# Patient Record
Sex: Female | Born: 1980 | Race: White | Hispanic: No | Marital: Single | Smoking: Current every day smoker
Health system: Southern US, Community
[De-identification: ages and names within clinical notes are randomized; demographics above are authoritative.]

## PROBLEM LIST (undated history)

## (undated) DIAGNOSIS — R0600 Dyspnea, unspecified: Secondary | ICD-10-CM

## (undated) DIAGNOSIS — R569 Unspecified convulsions: Secondary | ICD-10-CM

## (undated) DIAGNOSIS — F419 Anxiety disorder, unspecified: Secondary | ICD-10-CM

## (undated) DIAGNOSIS — S272XXA Traumatic hemopneumothorax, initial encounter: Secondary | ICD-10-CM

## (undated) DIAGNOSIS — S129XXA Fracture of neck, unspecified, initial encounter: Secondary | ICD-10-CM

## (undated) DIAGNOSIS — F333 Major depressive disorder, recurrent, severe with psychotic symptoms: Secondary | ICD-10-CM

## (undated) DIAGNOSIS — F3281 Premenstrual dysphoric disorder: Secondary | ICD-10-CM

## (undated) DIAGNOSIS — F329 Major depressive disorder, single episode, unspecified: Secondary | ICD-10-CM

## (undated) DIAGNOSIS — T50902A Poisoning by unspecified drugs, medicaments and biological substances, intentional self-harm, initial encounter: Secondary | ICD-10-CM

## (undated) DIAGNOSIS — J9690 Respiratory failure, unspecified, unspecified whether with hypoxia or hypercapnia: Secondary | ICD-10-CM

## (undated) DIAGNOSIS — T7840XA Allergy, unspecified, initial encounter: Secondary | ICD-10-CM

## (undated) DIAGNOSIS — G8929 Other chronic pain: Secondary | ICD-10-CM

## (undated) DIAGNOSIS — S62609A Fracture of unspecified phalanx of unspecified finger, initial encounter for closed fracture: Secondary | ICD-10-CM

## (undated) DIAGNOSIS — F332 Major depressive disorder, recurrent severe without psychotic features: Secondary | ICD-10-CM

## (undated) DIAGNOSIS — J189 Pneumonia, unspecified organism: Secondary | ICD-10-CM

## (undated) DIAGNOSIS — F39 Unspecified mood [affective] disorder: Secondary | ICD-10-CM

## (undated) DIAGNOSIS — M25512 Pain in left shoulder: Secondary | ICD-10-CM

## (undated) DIAGNOSIS — R1310 Dysphagia, unspecified: Secondary | ICD-10-CM

## (undated) DIAGNOSIS — N946 Dysmenorrhea, unspecified: Secondary | ICD-10-CM

## (undated) DIAGNOSIS — S22081A Stable burst fracture of T11-T12 vertebra, initial encounter for closed fracture: Secondary | ICD-10-CM

## (undated) DIAGNOSIS — S32009A Unspecified fracture of unspecified lumbar vertebra, initial encounter for closed fracture: Secondary | ICD-10-CM

## (undated) DIAGNOSIS — S12200A Unspecified displaced fracture of third cervical vertebra, initial encounter for closed fracture: Secondary | ICD-10-CM

## (undated) DIAGNOSIS — S060X9A Concussion with loss of consciousness of unspecified duration, initial encounter: Secondary | ICD-10-CM

## (undated) DIAGNOSIS — N200 Calculus of kidney: Secondary | ICD-10-CM

## (undated) DIAGNOSIS — G43009 Migraine without aura, not intractable, without status migrainosus: Secondary | ICD-10-CM

## (undated) DIAGNOSIS — S42009A Fracture of unspecified part of unspecified clavicle, initial encounter for closed fracture: Secondary | ICD-10-CM

## (undated) DIAGNOSIS — F32A Depression, unspecified: Secondary | ICD-10-CM

## (undated) DIAGNOSIS — N179 Acute kidney failure, unspecified: Secondary | ICD-10-CM

## (undated) DIAGNOSIS — F19951 Other psychoactive substance use, unspecified with psychoactive substance-induced psychotic disorder with hallucinations: Secondary | ICD-10-CM

## (undated) DIAGNOSIS — J45909 Unspecified asthma, uncomplicated: Secondary | ICD-10-CM

## (undated) DIAGNOSIS — G4700 Insomnia, unspecified: Secondary | ICD-10-CM

## (undated) HISTORY — DX: Anxiety disorder, unspecified: F41.9

## (undated) HISTORY — DX: Traumatic hemopneumothorax, initial encounter: S27.2XXA

## (undated) HISTORY — DX: Migraine without aura, not intractable, without status migrainosus: G43.009

## (undated) HISTORY — DX: Insomnia, unspecified: G47.00

## (undated) HISTORY — DX: Concussion with loss of consciousness of unspecified duration, initial encounter: S06.0X9A

## (undated) HISTORY — DX: Poisoning by unspecified drugs, medicaments and biological substances, intentional self-harm, initial encounter: T50.902A

## (undated) HISTORY — DX: Calculus of kidney: N20.0

## (undated) HISTORY — DX: Major depressive disorder, recurrent severe without psychotic features: F33.2

## (undated) HISTORY — DX: Other chronic pain: G89.29

## (undated) HISTORY — DX: Depression, unspecified: F32.A

## (undated) HISTORY — DX: Major depressive disorder, single episode, unspecified: F32.9

## (undated) HISTORY — DX: Fracture of unspecified phalanx of unspecified finger, initial encounter for closed fracture: S62.609A

## (undated) HISTORY — DX: Acute kidney failure, unspecified: N17.9

## (undated) HISTORY — DX: Unspecified asthma, uncomplicated: J45.909

## (undated) HISTORY — DX: Premenstrual dysphoric disorder: F32.81

## (undated) HISTORY — DX: Dysmenorrhea, unspecified: N94.6

## (undated) HISTORY — DX: Major depressive disorder, recurrent, severe with psychotic symptoms: F33.3

## (undated) HISTORY — DX: Other psychoactive substance use, unspecified with psychoactive substance-induced psychotic disorder with hallucinations: F19.951

## (undated) HISTORY — DX: Allergy, unspecified, initial encounter: T78.40XA

## (undated) HISTORY — DX: Pain in left shoulder: M25.512

## (undated) HISTORY — PX: FRACTURE SURGERY: SHX138

---

## 2003-03-18 ENCOUNTER — Emergency Department (HOSPITAL_COMMUNITY): Admission: EM | Admit: 2003-03-18 | Discharge: 2003-03-18 | Payer: Self-pay | Admitting: Emergency Medicine

## 2003-06-12 ENCOUNTER — Emergency Department (HOSPITAL_COMMUNITY): Admission: EM | Admit: 2003-06-12 | Discharge: 2003-06-12 | Payer: Self-pay | Admitting: Emergency Medicine

## 2003-09-07 ENCOUNTER — Emergency Department (HOSPITAL_COMMUNITY): Admission: EM | Admit: 2003-09-07 | Discharge: 2003-09-07 | Payer: Self-pay | Admitting: Emergency Medicine

## 2004-04-10 ENCOUNTER — Emergency Department (HOSPITAL_COMMUNITY): Admission: EM | Admit: 2004-04-10 | Discharge: 2004-04-11 | Payer: Self-pay | Admitting: Emergency Medicine

## 2006-10-01 ENCOUNTER — Emergency Department (HOSPITAL_COMMUNITY): Admission: EM | Admit: 2006-10-01 | Discharge: 2006-10-01 | Payer: Self-pay | Admitting: Emergency Medicine

## 2007-07-30 ENCOUNTER — Emergency Department (HOSPITAL_COMMUNITY): Admission: EM | Admit: 2007-07-30 | Discharge: 2007-07-30 | Payer: Self-pay | Admitting: Emergency Medicine

## 2007-09-07 ENCOUNTER — Emergency Department (HOSPITAL_COMMUNITY): Admission: EM | Admit: 2007-09-07 | Discharge: 2007-09-07 | Payer: Self-pay | Admitting: Emergency Medicine

## 2008-01-19 ENCOUNTER — Emergency Department (HOSPITAL_COMMUNITY): Admission: EM | Admit: 2008-01-19 | Discharge: 2008-01-19 | Payer: Self-pay | Admitting: Emergency Medicine

## 2008-01-20 ENCOUNTER — Emergency Department (HOSPITAL_COMMUNITY): Admission: EM | Admit: 2008-01-20 | Discharge: 2008-01-20 | Payer: Self-pay | Admitting: Emergency Medicine

## 2009-01-24 ENCOUNTER — Other Ambulatory Visit: Admission: RE | Admit: 2009-01-24 | Discharge: 2009-01-24 | Payer: Self-pay | Admitting: Gynecology

## 2009-01-24 ENCOUNTER — Ambulatory Visit: Payer: Self-pay | Admitting: Gynecology

## 2009-01-24 ENCOUNTER — Encounter: Payer: Self-pay | Admitting: Gynecology

## 2009-02-02 ENCOUNTER — Ambulatory Visit: Payer: Self-pay | Admitting: Gynecology

## 2009-02-10 ENCOUNTER — Ambulatory Visit: Payer: Self-pay | Admitting: Gynecology

## 2009-09-22 ENCOUNTER — Ambulatory Visit: Payer: Self-pay | Admitting: Women's Health

## 2009-09-22 ENCOUNTER — Other Ambulatory Visit: Admission: RE | Admit: 2009-09-22 | Discharge: 2009-09-22 | Payer: Self-pay | Admitting: Gynecology

## 2009-10-04 ENCOUNTER — Other Ambulatory Visit: Admission: RE | Admit: 2009-10-04 | Discharge: 2009-10-04 | Payer: Self-pay | Admitting: Gynecology

## 2009-10-04 ENCOUNTER — Ambulatory Visit: Payer: Self-pay | Admitting: Women's Health

## 2009-11-10 ENCOUNTER — Ambulatory Visit: Payer: Self-pay | Admitting: Gynecology

## 2010-06-21 ENCOUNTER — Ambulatory Visit: Payer: Self-pay | Admitting: Women's Health

## 2010-11-15 ENCOUNTER — Ambulatory Visit (INDEPENDENT_AMBULATORY_CARE_PROVIDER_SITE_OTHER): Payer: BC Managed Care – PPO | Admitting: Gynecology

## 2010-11-15 DIAGNOSIS — Z30431 Encounter for routine checking of intrauterine contraceptive device: Secondary | ICD-10-CM

## 2010-12-17 ENCOUNTER — Ambulatory Visit: Payer: BC Managed Care – PPO | Admitting: Gynecology

## 2010-12-31 ENCOUNTER — Ambulatory Visit (INDEPENDENT_AMBULATORY_CARE_PROVIDER_SITE_OTHER): Payer: BC Managed Care – PPO | Admitting: Gynecology

## 2010-12-31 DIAGNOSIS — Z30431 Encounter for routine checking of intrauterine contraceptive device: Secondary | ICD-10-CM

## 2011-08-08 ENCOUNTER — Encounter: Payer: Self-pay | Admitting: Emergency Medicine

## 2011-08-08 ENCOUNTER — Emergency Department (HOSPITAL_COMMUNITY)
Admission: EM | Admit: 2011-08-08 | Discharge: 2011-08-09 | Disposition: A | Discharge status: Home / Self Care | Payer: BC Managed Care – PPO | Attending: Emergency Medicine | Admitting: Emergency Medicine

## 2011-08-08 DIAGNOSIS — R110 Nausea: Secondary | ICD-10-CM | POA: Insufficient documentation

## 2011-08-08 DIAGNOSIS — R1011 Right upper quadrant pain: Secondary | ICD-10-CM | POA: Insufficient documentation

## 2011-08-08 DIAGNOSIS — M549 Dorsalgia, unspecified: Secondary | ICD-10-CM | POA: Insufficient documentation

## 2011-08-08 DIAGNOSIS — R10811 Right upper quadrant abdominal tenderness: Secondary | ICD-10-CM | POA: Insufficient documentation

## 2011-08-08 DIAGNOSIS — R197 Diarrhea, unspecified: Secondary | ICD-10-CM | POA: Insufficient documentation

## 2011-08-08 DIAGNOSIS — R112 Nausea with vomiting, unspecified: Secondary | ICD-10-CM

## 2011-08-08 DIAGNOSIS — R319 Hematuria, unspecified: Secondary | ICD-10-CM | POA: Insufficient documentation

## 2011-08-08 DIAGNOSIS — F17200 Nicotine dependence, unspecified, uncomplicated: Secondary | ICD-10-CM | POA: Insufficient documentation

## 2011-08-08 NOTE — ED Notes (Signed)
PT. REPORTS RUQ PAIN WITH VOMITTING AND DIARRHEA ONSET YESTERDAY WORSE WITH DEEP INSPIRATION.

## 2011-08-09 ENCOUNTER — Emergency Department (HOSPITAL_COMMUNITY): Payer: BC Managed Care – PPO

## 2011-08-09 LAB — COMPREHENSIVE METABOLIC PANEL
ALT: 8 U/L (ref 0–35)
BUN: 10 mg/dL (ref 6–23)
CO2: 25 mEq/L (ref 19–32)
Chloride: 106 mEq/L (ref 96–112)
Creatinine, Ser: 0.6 mg/dL (ref 0.50–1.10)
Glucose, Bld: 120 mg/dL — ABNORMAL HIGH (ref 70–99)
Total Bilirubin: 0.2 mg/dL — ABNORMAL LOW (ref 0.3–1.2)
Total Protein: 6.2 g/dL (ref 6.0–8.3)

## 2011-08-09 LAB — CBC
HCT: 39.3 % (ref 36.0–46.0)
MCHC: 34.4 g/dL (ref 30.0–36.0)
MCV: 92.3 fL (ref 78.0–100.0)
Platelets: 218 10*3/uL (ref 150–400)
RBC: 4.26 MIL/uL (ref 3.87–5.11)
RDW: 12.7 % (ref 11.5–15.5)
WBC: 9.7 10*3/uL (ref 4.0–10.5)

## 2011-08-09 LAB — DIFFERENTIAL
Monocytes Absolute: 0.6 10*3/uL (ref 0.1–1.0)
Monocytes Relative: 6 % (ref 3–12)

## 2011-08-09 LAB — URINALYSIS, ROUTINE W REFLEX MICROSCOPIC
Bilirubin Urine: NEGATIVE
Glucose, UA: NEGATIVE mg/dL
Nitrite: NEGATIVE
Protein, ur: NEGATIVE mg/dL
pH: 6.5 (ref 5.0–8.0)

## 2011-08-09 LAB — PREGNANCY, URINE: Preg Test, Ur: NEGATIVE

## 2011-08-09 LAB — URINE MICROSCOPIC-ADD ON

## 2011-08-09 MED ORDER — ONDANSETRON HCL 4 MG PO TABS
4.0000 mg | ORAL_TABLET | Freq: Four times a day (QID) | ORAL | Status: AC
Start: 2011-08-09 — End: 2011-08-16

## 2011-08-09 MED ORDER — HYDROMORPHONE HCL PF 1 MG/ML IJ SOLN
1.0000 mg | Freq: Once | INTRAMUSCULAR | Status: AC
  Administered 2011-08-09: 1 mg via INTRAVENOUS
  Filled 2011-08-09: qty 1

## 2011-08-09 MED ORDER — ONDANSETRON HCL 4 MG/2ML IJ SOLN
4.0000 mg | Freq: Once | INTRAMUSCULAR | Status: AC
  Administered 2011-08-09: 4 mg via INTRAVENOUS
  Filled 2011-08-09: qty 2

## 2011-08-09 MED ORDER — SODIUM CHLORIDE 0.9 % IV BOLUS (SEPSIS)
1000.0000 mL | Freq: Once | INTRAVENOUS | Status: AC
  Administered 2011-08-09: 1000 mL via INTRAVENOUS

## 2011-08-09 MED ORDER — HYDROCODONE-ACETAMINOPHEN 5-500 MG PO TABS: 1.0000 | ORAL_TABLET | Freq: Four times a day (QID) | ORAL | Status: AC | PRN

## 2011-08-09 NOTE — ED Provider Notes (Signed)
History     CSN: [id number] Arrival date & time: 08/08/2011 11:41 PM   First MD Initiated Contact with Patient 08/09/11 0129      Chief Complaint  Patient presents with  . Emesis    (Consider location/radiation/quality/duration/timing/severity/associated sxs/prior treatment) The history is provided by the patient.   patient reports nausea vomiting and diarrhea since yesterday.  She reports new constant right upper quadrant pain that radiates around her arrival upper back.  She reports nonbloody nonbilious vomiting.  She reports the diarrhea is voluminous and watery treatment she denies melena or hematochezia.  She denies fevers and chills.  She reports she was told she was having a "gallbladder attack" several years ago but has never had an ultrasound to evaluate for cholelithiasis.  Her pain is constant.  It is worsened by palpation and movement.  It is improved by nothing.  She reports her nausea is improved at this time.  Her pain is moderate.  History reviewed. No pertinent past medical history.  History reviewed. No pertinent past surgical history.  No family history on file.  History  Substance Use Topics  . Smoking status: Current Everyday Smoker  . Smokeless tobacco: Not on file  . Alcohol Use: Yes    OB History    Grav Para Term Preterm Abortions TAB SAB Ect Mult Living                  Review of Systems  Gastrointestinal: Positive for vomiting.  All other systems reviewed and are negative.    Allergies  Penicillins  Home Medications   Current Outpatient Rx  Name Route Sig Dispense Refill  . IBUPROFEN 600 MG PO TABS Oral Take 600 mg by mouth every 6 (six) hours as needed. For pain     . THERA M PLUS PO TABS Oral Take 1 tablet by mouth daily.       BP 113/66  Pulse 65  Temp(Src) 98.2 F (36.8 C) (Oral)  Resp 18  SpO2 100%  LMP 08/05/2011  Physical Exam  Nursing note and vitals reviewed. Constitutional: She is oriented to person, place, and time.  She appears well-developed and well-nourished. No distress.  HENT:  Head: Normocephalic and atraumatic.  Eyes: EOM are normal.  Neck: Normal range of motion.  Cardiovascular: Normal rate, regular rhythm and normal heart sounds.   Pulmonary/Chest: Effort normal and breath sounds normal.  Abdominal: Soft. She exhibits no distension.       Tenderness in the right upper quadrant without guarding or rebound.  No right CVA tenderness  Musculoskeletal: Normal range of motion.  Neurological: She is alert and oriented to person, place, and time.  Skin: Skin is warm and dry.  Psychiatric: She has a normal mood and affect. Judgment normal.    ED Course  Procedures (including critical care time)  Labs Reviewed  COMPREHENSIVE METABOLIC PANEL - Abnormal; Notable for the following:    Glucose, Bld 120 (*)    Total Bilirubin 0.2 (*)    All other components within normal limits  URINALYSIS, ROUTINE W REFLEX MICROSCOPIC - Abnormal; Notable for the following:    Hgb urine dipstick SMALL (*)    All other components within normal limits  CBC  DIFFERENTIAL  PREGNANCY, URINE  LIPASE, BLOOD  URINE MICROSCOPIC-ADD ON   US Abdomen Complete  08/09/2011  *RADIOLOGY REPORT*  Clinical Data:  Right upper quadrant abdominal pain for 1 day. Hematuria.  ABDOMINAL ULTRASOUND COMPLETE  Comparison:  None  Findings:  Gallbladder:  The gallbladder is normal in appearance, without evidence for gallstones, gallbladder wall thickening or pericholecystic fluid.  No ultrasonographic Murphy's sign is elicited.  Common Bile Duct:  0.3 cm in diameter; within normal limits in caliber.  Liver:  Normal parenchymal echogenicity and echotexture; no focal lesions identified.  Limited Doppler evaluation demonstrates normal blood flow within the liver.  IVC:  Unremarkable in appearance.  Pancreas:  Although the pancreas is difficult to visualize in its entirety due to overlying bowel gas, no focal pancreatic abnormality is identified.   Spleen:  8.7 cm in length; within normal limits in size and echotexture.  Right kidney:  11.1 cm in length; normal in size, configuration and parenchymal echogenicity.  No evidence of mass or hydronephrosis.  Left kidney:  12.8 cm in length; normal in size, configuration and parenchymal echogenicity.  No evidence of hydronephrosis.  Two tiny nonspecific foci of increased attenuation within the upper pole of the right kidney, measuring 0.4 cm and 0.6 cm in size, may reflect small angiomyolipomas.  Calcification is considered less likely given the location and the lack of posterior acoustic shadowing.  Abdominal Aorta:  Normal in caliber; no aneurysm identified.  IMPRESSION:  1.  No acute abnormalities identified within the abdomen. 2.  Question of tiny angiomyolipomas within the upper pole of the left kidney.  Original Report Authenticated By: Tonia Ghent, M.D.     1. Nausea vomiting and diarrhea       MDM  Concerning for biliary colic.  May also represent gastroenteritis given her nausea vomiting and diarrhea We'll obtain ultrasound to evaluate further.  Pain being treated at this time.  Will add lipase  5:17 AM Patient feels much better at this time and is eating a sandwich.       Lyanne Co, MD 08/09/11 [phone]

## 2011-08-09 NOTE — ED Notes (Signed)
Pt given happy meal.  Tolerated well w/out nausea, pain or emesis.

## 2011-08-09 NOTE — ED Notes (Signed)
Patient transported to Ultrasound 

## 2011-08-09 NOTE — ED Notes (Signed)
PT states pain 2/10 after dilaudid and denies nausea - she is asking for something to eat.  Pt taken to [place].

## 2011-08-09 NOTE — ED Notes (Signed)
PT back from [place] stating that she is STARVING and would like to eat.  Await [place] results.

## 2011-08-09 NOTE — ED Notes (Signed)
Called [place].  They stated they have sent for pt.

## 2011-09-13 ENCOUNTER — Ambulatory Visit (INDEPENDENT_AMBULATORY_CARE_PROVIDER_SITE_OTHER): Payer: BC Managed Care – PPO

## 2011-09-13 DIAGNOSIS — J019 Acute sinusitis, unspecified: Secondary | ICD-10-CM

## 2011-09-13 DIAGNOSIS — R059 Cough, unspecified: Secondary | ICD-10-CM

## 2011-09-13 DIAGNOSIS — R05 Cough: Secondary | ICD-10-CM

## 2011-10-29 ENCOUNTER — Other Ambulatory Visit: Payer: Self-pay

## 2011-10-29 MED ORDER — ZOLPIDEM TARTRATE 10 MG PO TABS: 10.0000 mg | ORAL_TABLET | Freq: Every evening | ORAL | Status: DC | PRN

## 2011-11-07 ENCOUNTER — Other Ambulatory Visit: Payer: Self-pay | Admitting: Physician Assistant

## 2011-11-07 MED ORDER — ZOLPIDEM TARTRATE 10 MG PO TABS: 10.0000 mg | ORAL_TABLET | Freq: Every evening | ORAL | Status: DC | PRN

## 2011-11-08 ENCOUNTER — Telehealth: Payer: Self-pay

## 2011-11-08 NOTE — Telephone Encounter (Signed)
Pt would like referral for a breast mammogram if possible

## 2011-11-11 NOTE — Telephone Encounter (Signed)
I think an OV would be best to determine the reason for her requesting this exam.

## 2011-11-11 NOTE — Telephone Encounter (Signed)
LMOM for pt that if she just needs a routine screening mammogram, no referral is needed, and she can call herself and schedule appt. If she thinks there is a problem and wants a diagnostic mammogram, she just needs to come in for eval and then we could refer for that if needed. Asked for CB w/further ?s.

## 2011-11-19 ENCOUNTER — Ambulatory Visit (INDEPENDENT_AMBULATORY_CARE_PROVIDER_SITE_OTHER): Payer: BC Managed Care – PPO | Admitting: Physician Assistant

## 2011-11-19 ENCOUNTER — Encounter: Payer: Self-pay | Admitting: Physician Assistant

## 2011-11-19 VITALS — BP 118/80 | HR 84 | Temp 98.5°F | Resp 16 | Ht 68.0 in | Wt 172.0 lb

## 2011-11-19 DIAGNOSIS — N644 Mastodynia: Secondary | ICD-10-CM

## 2011-11-19 DIAGNOSIS — G4700 Insomnia, unspecified: Secondary | ICD-10-CM

## 2011-11-19 DIAGNOSIS — F419 Anxiety disorder, unspecified: Secondary | ICD-10-CM

## 2011-11-19 DIAGNOSIS — F329 Major depressive disorder, single episode, unspecified: Secondary | ICD-10-CM

## 2011-11-19 DIAGNOSIS — F341 Dysthymic disorder: Secondary | ICD-10-CM

## 2011-11-19 MED ORDER — LORAZEPAM 1 MG PO TABS: 0.5000 mg | ORAL_TABLET | Freq: Two times a day (BID) | ORAL | Status: AC | PRN

## 2011-11-19 MED ORDER — PAROXETINE HCL 20 MG PO TABS: 10.0000 mg | ORAL_TABLET | ORAL | Status: DC

## 2011-11-19 NOTE — Patient Instructions (Signed)
Please call me if you're not tolerating the paroxetine (Paxil) so we can change you to something else.

## 2011-11-19 NOTE — Progress Notes (Signed)
  Subjective:    Patient ID: Barbara Rogers, female    DOB: 09/11/1980, 31 y.o.   MRN: [id number]  HPI This patient presents complaining of worsening anxiety and depression.  Has become extremely irritable.  Concerned she'll lose her job.  Works at [employer] at [employer], and donates plasma.  Symptoms are worst during the two weeks around her period, but occur all the time.  We've tried several contraceptive methods to eliminate her menses, but she has not tolerated any of the hormonal options, and is proven to be quite fertile.  She now has a Paragard IUD.  Her PMS symptoms have been severe for years.  She found her mother's body (deceased by suicide) as an early adolescent, on the first day of her menstrual cycle that month.  She notes that she and her mother were on the same cycle and that her PMS symptoms worsened then.  She's tried Prozac and Effexor previously (Effexor caused pupil dilation) and tried Paxil only briefly.    She's working on increasing her exercise (Zumba is fun!), but is less worried about weight gain now than previously, and just wants to feel better.  Difficulty sleeping has worsened with her irritability.   Review of Systems As above.  No SI/HI    Objective:   Physical Exam  Constitutional: She is oriented to person, place, and time. Vital signs are normal. She appears well-developed and well-nourished. No distress.  HENT:  Head: Normocephalic and atraumatic.  Right Ear: Hearing normal.  Left Ear: Hearing normal.  Eyes: EOM are normal. Pupils are equal, round, and reactive to light.  Neck: Normal range of motion. Neck supple. No thyromegaly present.  Cardiovascular: Normal rate, regular rhythm and normal heart sounds.   Pulses:      Radial pulses are 2+ on the right side, and 2+ on the left side.       Dorsalis pedis pulses are 2+ on the right side, and 2+ on the left side.       Posterior tibial pulses are 2+ on the right side, and 2+ on the left side.    Pulmonary/Chest: Effort normal and breath sounds normal.  Lymphadenopathy:       Head (right side): No tonsillar, no preauricular, no posterior auricular and no occipital adenopathy present.       Head (left side): No tonsillar, no preauricular, no posterior auricular and no occipital adenopathy present.    She has no cervical adenopathy.       Right: No supraclavicular adenopathy present.       Left: No supraclavicular adenopathy present.  Neurological: She is alert and oriented to person, place, and time. No sensory deficit.  Skin: Skin is warm, dry and intact. No rash noted. No cyanosis or erythema. Nails show no clubbing.  Psychiatric: She has a normal mood and affect.          Assessment & Plan:  Anxiety and Depression Restart paroxetine.  Continue Ativan. Re-evaluate in 4 weeks.  Call sooner if needed.  Insomnia Continue Ambien prn.

## 2011-11-20 ENCOUNTER — Encounter: Payer: Self-pay | Admitting: Obstetrics and Gynecology

## 2011-11-25 ENCOUNTER — Inpatient Hospital Stay: Admission: RE | Admit: 2011-11-25 | Payer: BC Managed Care – PPO | Source: Ambulatory Visit

## 2011-12-05 ENCOUNTER — Telehealth: Payer: Self-pay

## 2011-12-05 NOTE — Telephone Encounter (Signed)
Pt is requesting Dr note for being out of work for female problems, she states she has seen [name] about this medical problem and would like to see if [name] can prescribe her something, please contact patient when work note is ready for pick-up.

## 2011-12-06 ENCOUNTER — Encounter: Payer: Self-pay | Admitting: Family Medicine

## 2011-12-06 NOTE — Telephone Encounter (Signed)
Spoke with [name] its ok for me to give patient a note out of work for 3 days

## 2011-12-10 ENCOUNTER — Telehealth: Payer: Self-pay | Admitting: *Deleted

## 2011-12-10 ENCOUNTER — Ambulatory Visit (INDEPENDENT_AMBULATORY_CARE_PROVIDER_SITE_OTHER): Payer: BC Managed Care – PPO | Admitting: Family Medicine

## 2011-12-10 VITALS — BP 115/71 | HR 69 | Temp 98.2°F | Resp 16 | Ht 68.0 in | Wt 164.0 lb

## 2011-12-10 DIAGNOSIS — R197 Diarrhea, unspecified: Secondary | ICD-10-CM

## 2011-12-10 DIAGNOSIS — R110 Nausea: Secondary | ICD-10-CM

## 2011-12-10 DIAGNOSIS — N946 Dysmenorrhea, unspecified: Secondary | ICD-10-CM

## 2011-12-10 MED ORDER — PROMETHAZINE HCL 25 MG PO TABS: 25.0000 mg | ORAL_TABLET | Freq: Three times a day (TID) | ORAL | Status: DC | PRN

## 2011-12-10 MED ORDER — LAMOTRIGINE 100 MG PO TABS: 100.0000 mg | ORAL_TABLET | Freq: Every day | ORAL | Status: DC

## 2011-12-10 NOTE — Telephone Encounter (Signed)
Pt was seen today and was given some Lamictal and wanted to know if there is something she can take while the med kicks in.  She is in need of something now.

## 2011-12-10 NOTE — Progress Notes (Signed)
31 yo woman who started Paxil one month ago.  She developed nausea and vomiting when she increased the dose to 20 mg.  Her period came on last week and she stopped Paxil on Friday, but stomach rumbling and nausea has worsened.  Continues to have diarrhea.  She has taken Maalox as well, but has not been able to sleep for 24 hours because of the GI symptoms.  Ambien is not working.  Unable to work at [employer] today.  Needs a note for last Friday. H/O horrible dysmenorrha.  Has tried Mirena, nuva ring, OCP's  O:  Alert and cooperative Abdomen: soft, no HSM or masses, ;hyperactive BS Chest:  Clear Heart:  Reg, no murmur Skin: clear  A:   Affective disorder, dysmenorrhea  P:  Trial of Lamictal 100 qhs. Phenergan 25 po q8h

## 2011-12-10 NOTE — Telephone Encounter (Signed)
This is the best we can do, unless she would like to see a psychiatrist.  I can arrange for a psychiatrist if she is willing to go.

## 2011-12-10 NOTE — Patient Instructions (Signed)

## 2011-12-11 NOTE — Telephone Encounter (Signed)
LMOM with Dr Cain Saupe message. Asked for CB if she would like [place] to try to get her in to see a psychiatrist, or w/any ?s

## 2011-12-18 ENCOUNTER — Telehealth: Payer: Self-pay

## 2011-12-18 NOTE — Telephone Encounter (Signed)
Dr. Elbert Ewings, you last saw her and put her on Lamictal.  She is requesting Ambien to help her sleep, please advise

## 2011-12-18 NOTE — Telephone Encounter (Signed)
PT CALLED AND STATED SHE NEEDS HER REFILL SO SHE CAN SLEEP

## 2011-12-18 NOTE — Telephone Encounter (Signed)
[facility] REQUESTING A REFILL ON PT'S AMBRIENE. PLEASE CALL [phone]

## 2011-12-19 ENCOUNTER — Other Ambulatory Visit: Payer: Self-pay | Admitting: Family Medicine

## 2011-12-19 ENCOUNTER — Other Ambulatory Visit: Payer: BC Managed Care – PPO

## 2011-12-19 ENCOUNTER — Telehealth: Payer: Self-pay | Admitting: Radiology

## 2011-12-19 MED ORDER — ZOLPIDEM TARTRATE 5 MG PO TABS: 5.0000 mg | ORAL_TABLET | Freq: Every evening | ORAL | Status: DC | PRN

## 2011-12-19 NOTE — Telephone Encounter (Signed)
Called in rx for ambien per Dr Milus Glazier and lmom to let pt know.

## 2012-02-06 ENCOUNTER — Other Ambulatory Visit: Payer: Self-pay | Admitting: *Deleted

## 2012-02-06 MED ORDER — LORAZEPAM 1 MG PO TABS: ORAL_TABLET | ORAL | Status: DC

## 2012-02-12 ENCOUNTER — Ambulatory Visit (INDEPENDENT_AMBULATORY_CARE_PROVIDER_SITE_OTHER): Payer: BC Managed Care – PPO | Admitting: Family Medicine

## 2012-02-12 VITALS — BP 100/66 | HR 70 | Temp 98.3°F | Resp 16 | Ht 67.5 in | Wt 176.6 lb

## 2012-02-12 DIAGNOSIS — R109 Unspecified abdominal pain: Secondary | ICD-10-CM

## 2012-02-12 LAB — COMPREHENSIVE METABOLIC PANEL
ALT: 8 U/L (ref 0–35)
AST: 11 U/L (ref 0–37)
Albumin: 4.2 g/dL (ref 3.5–5.2)
BUN: 17 mg/dL (ref 6–23)
CO2: 27 mEq/L (ref 19–32)
Calcium: 9.6 mg/dL (ref 8.4–10.5)
Chloride: 105 mEq/L (ref 96–112)
Creat: 0.69 mg/dL (ref 0.50–1.10)
Total Protein: 6.3 g/dL (ref 6.0–8.3)

## 2012-02-12 LAB — POCT CBC
Granulocyte percent: 73.5 %G (ref 37–80)
MCHC: 33.3 g/dL (ref 31.8–35.4)
MID (cbc): 0.8 (ref 0–0.9)
POC MID %: 6.6 %M (ref 0–12)
Platelet Count, POC: 226 10*3/uL (ref 142–424)
WBC: 12.6 10*3/uL — AB (ref 4.6–10.2)

## 2012-02-12 LAB — POCT UA - MICROSCOPIC ONLY
Crystals, Ur, HPF, POC: NEGATIVE
Yeast, UA: NEGATIVE

## 2012-02-12 LAB — POCT URINALYSIS DIPSTICK
Leukocytes, UA: NEGATIVE
pH, UA: 6

## 2012-02-12 MED ORDER — HYDROCODONE-ACETAMINOPHEN 5-500 MG PO TABS: 1.0000 | ORAL_TABLET | Freq: Three times a day (TID) | ORAL | Status: AC | PRN

## 2012-02-12 NOTE — Progress Notes (Signed)
Subjective:    Patient ID: Barbara Rogers, female    DOB: 1980/10/23, 31 y.o.   MRN: [id number]  HPI 31 yo female here with 1 day history of rib pain/back pain.  Started when she was walking into work.  Same thing happened in February.  Went to ED then.  They thought it was the gall bladder.  RUQ/right back.  Feels like someone stepping on her.  Comes and goes.  U/S in Feb was normal.  Can't get comfortable.  Hurts with deep breath.  Feels tight.  Does a lot of bending at job.  Took a vicodin from a root canal - did help.   No dysuria.  No fever.  Some nausea initially but not anymore.    Review of Systems Negative except as per HPI     Objective:   Physical Exam  Constitutional: Vital signs are normal. She appears well-developed and well-nourished. She is active.  Cardiovascular: Normal rate, regular rhythm, normal heart sounds and normal pulses.   Pulmonary/Chest: Effort normal and breath sounds normal.  Abdominal: Soft. Normal appearance and bowel sounds are normal. She exhibits no distension and no mass. There is no hepatosplenomegaly. There is tenderness in the right upper quadrant. There is no rigidity, no rebound, no guarding, no CVA tenderness, no tenderness at McBurney's point and negative Murphy's sign. No hernia.       No pain over palpation of rib cage.  No CVA tenderness No rebound or guarding and pain to palpa is lateral RUQ, not over gall bladder  Neurological: She is alert.   Results for orders placed in visit on 02/12/12  POCT CBC      Component Value Range   WBC 12.6 (*) 4.6 - 10.2 (K/uL)   Lymph, poc 2.5  0.6 - 3.4    POC LYMPH PERCENT 19.9  10 - 50 (%L)   MID (cbc) 0.8  0 - 0.9    POC MID % 6.6  0 - 12 (%M)   POC Granulocyte 9.3 (*) 2 - 6.9    Granulocyte percent 73.5  37 - 80 (%G)   RBC 4.58  4.04 - 5.48 (M/uL)   Hemoglobin 14.6  12.2 - 16.2 (g/dL)   HCT, POC 65.7  84.6 - 47.9 (%)   MCV 95.6  80 - 97 (fL)   MCH, POC 31.9 (*) 27 - 31.2 (pg)   MCHC 33.3  31.8  - 35.4 (g/dL)   RDW, POC 96.2     Platelet Count, POC 226  142 - 424 (K/uL)   MPV 10.3  0 - 99.8 (fL)  POCT URINALYSIS DIPSTICK      Component Value Range   Color, UA dark yellow     Clarity, UA cloudy     Glucose, UA neg     Bilirubin, UA small     Ketones, UA neg     Spec Grav, UA >=1.030     Blood, UA neg     pH, UA 6.0     Protein, UA trace     Urobilinogen, UA 0.2     Nitrite, UA neg     Leukocytes, UA Negative    POCT UA - MICROSCOPIC ONLY      Component Value Range   WBC, Ur, HPF, POC 0-1     RBC, urine, microscopic 0-2     Bacteria, U Microscopic trace     Mucus, UA small     Epithelial cells, urine per micros 3-5  Crystals, Ur, HPF, POC neg     Casts, Ur, LPF, POC neg     Yeast, UA neg            Assessment & Plan:  RUQ abdomianl pain - WBC slightly high but HB high normal as well and urine c/w dehydration.  No fever and benign abdominal exam.  Abd u/s in Feb negative. Use flexeril at home, short term vicodin rx here.  Monitor.  If worsens or not better in 3 days, RTC. Increase fluids.

## 2012-02-14 ENCOUNTER — Encounter: Payer: Self-pay | Admitting: Radiology

## 2012-02-14 ENCOUNTER — Telehealth: Payer: Self-pay

## 2012-02-14 NOTE — Telephone Encounter (Signed)
I spoke to patient and advised will get work note okay per Dr Patsy Lager, however if she does not improve over the next 1-2 days she should return to clinic. Barbara Rogers

## 2012-02-14 NOTE — Telephone Encounter (Signed)
Pt was given [facility] note for Wednesday but also needs one for Thursday now too.

## 2012-03-06 ENCOUNTER — Other Ambulatory Visit: Payer: Self-pay

## 2012-03-06 MED ORDER — ZOLPIDEM TARTRATE 5 MG PO TABS: 5.0000 mg | ORAL_TABLET | Freq: Every evening | ORAL | Status: DC | PRN

## 2012-03-09 DIAGNOSIS — Z0271 Encounter for disability determination: Secondary | ICD-10-CM

## 2012-03-11 ENCOUNTER — Other Ambulatory Visit: Payer: Self-pay | Admitting: Physician Assistant

## 2012-03-11 MED ORDER — LORAZEPAM 1 MG PO TABS: ORAL_TABLET | ORAL | Status: DC

## 2012-03-17 ENCOUNTER — Ambulatory Visit (INDEPENDENT_AMBULATORY_CARE_PROVIDER_SITE_OTHER): Payer: BC Managed Care – PPO | Admitting: Family Medicine

## 2012-03-17 ENCOUNTER — Ambulatory Visit: Payer: BC Managed Care – PPO

## 2012-03-17 VITALS — BP 114/78 | HR 73 | Temp 98.5°F | Resp 16 | Ht 67.5 in | Wt 174.0 lb

## 2012-03-17 DIAGNOSIS — M62838 Other muscle spasm: Secondary | ICD-10-CM

## 2012-03-17 DIAGNOSIS — M545 Low back pain, unspecified: Secondary | ICD-10-CM

## 2012-03-17 DIAGNOSIS — R300 Dysuria: Secondary | ICD-10-CM

## 2012-03-17 DIAGNOSIS — R350 Frequency of micturition: Secondary | ICD-10-CM

## 2012-03-17 LAB — POCT URINALYSIS DIPSTICK
Bilirubin, UA: NEGATIVE
Blood, UA: NEGATIVE
Glucose, UA: NEGATIVE
Ketones, UA: NEGATIVE
Leukocytes, UA: NEGATIVE
Nitrite, UA: NEGATIVE
Protein, UA: NEGATIVE
Spec Grav, UA: >=1.03
Urobilinogen, UA: 0.2
pH, UA: 5.5

## 2012-03-17 LAB — POCT UA - MICROSCOPIC ONLY
Amorphous: POSITIVE
Bacteria, U Microscopic: NEGATIVE
Casts, Ur, LPF, POC: NEGATIVE
Crystals, Ur, HPF, POC: NEGATIVE
Mucus, UA: NEGATIVE
RBC, urine, microscopic: NEGATIVE
Yeast, UA: NEGATIVE

## 2012-03-17 MED ORDER — MELOXICAM 7.5 MG PO TABS: 7.5000 mg | ORAL_TABLET | Freq: Every day | ORAL | Status: DC

## 2012-03-17 MED ORDER — CYCLOBENZAPRINE HCL 5 MG PO TABS: ORAL_TABLET | ORAL | Status: DC

## 2012-03-17 MED ORDER — HYDROCODONE-ACETAMINOPHEN 5-325 MG PO TABS: 1.0000 | ORAL_TABLET | Freq: Four times a day (QID) | ORAL | Status: DC | PRN

## 2012-03-17 NOTE — Patient Instructions (Signed)
Be careful combining flexeril and hydrocodone as these both can cause sedation. Heat or ice can help with muscle spasm. See back care manual. If not improving in next few days - return to clinic. Return to the clinic or go to the nearest emergency room if any of your symptoms worsen or new symptoms occur.

## 2012-03-17 NOTE — Progress Notes (Signed)
Subjective:    Patient ID: Barbara Rogers, female    DOB: [birth year], 31 y.o.   MRN: [id number]  HPI Barbara Rogers is a 31 y.o. female L sided back pain - started 2 days ago - worse this am.  Had kidney stone 10 years ago.  Feels like a cramp - can't get comfortable - no relief with ice or hot bath last night.  NKI at work.  Started over the weekend. Worse this am. Urinating more frequently past 2 days, but has been drinking more water at work.  No hematuria, dysuria. No bowel or bladder incontinence, no saddle anesthesia, no lower extremity weakness.   Tx: advil.   Review of Systems  Gastrointestinal: Negative for abdominal pain.  Genitourinary: Positive for frequency. Negative for dysuria, hematuria, vaginal bleeding, vaginal discharge, difficulty urinating and pelvic pain.       LMP 6/20.   Musculoskeletal: Positive for back pain.  Neurological: Negative for weakness.       Objective:   Physical Exam  Constitutional: She is oriented to person, place, and time. She appears well-developed and well-nourished.  Pulmonary/Chest: Effort normal.  Abdominal: Normal appearance. There is no CVA tenderness.  Musculoskeletal:       Lumbar back: She exhibits tenderness. She exhibits normal range of motion and no bony tenderness.       Back:  Neurological: She is alert and oriented to person, place, and time. She has normal strength. No sensory deficit. She displays no Babinski's sign on the right side. She displays no Babinski's sign on the left side.  Reflex Scores:      Patellar reflexes are 2+ on the right side and 2+ on the left side.      Achilles reflexes are 2+ on the left side. Skin: Skin is warm and dry. No rash noted.  Psychiatric: She has a normal mood and affect. Her behavior is normal.      Results for orders placed in visit on 03/17/12  POCT UA - MICROSCOPIC ONLY      Component Value Range   WBC, Ur, HPF, POC 0-1     RBC, urine, microscopic neg     Bacteria, U  Microscopic neg     Mucus, UA neg     Epithelial cells, urine per micros 1-2     Crystals, Ur, HPF, POC neg     Casts, Ur, LPF, POC neg     Yeast, UA neg     Amorphous positive    POCT URINALYSIS DIPSTICK      Component Value Range   Color, UA dark yellow     Clarity, UA clear     Glucose, UA neg     Bilirubin, UA neg     Ketones, UA neg     Spec Grav, UA >=1.030     Blood, UA neg     pH, UA 5.5     Protein, UA neg     Urobilinogen, UA 0.2     Nitrite, UA neg     Leukocytes, UA Negative     [facility] reading (PRIMARY) by  Dr. Neva Seat: LS spine: negative.      Assessment & Plan:  Barbara Rogers is a 31 y.o. female 1. Dysuria  POCT UA - Microscopic Only, POCT urinalysis dipstick  2. Lower back pain  DG Lumbar Spine Complete   Urinalysis reassuring.  Suspect frequency with increased po fluids.  If persists, rtc.  LBP - paraspinal strain/spasm likely.  Trial  of flexeril 5mg  Q8prn - sed, mobic7.5mg  qd, alternate ice and heat. Would like pain med temporarily for work tonight.  - has not caused sedation in past. Can take 1 every 6 hours. SED. Avoid combo with flexeril. Back care manual. Recheck in next few days if not improving.

## 2012-03-25 ENCOUNTER — Ambulatory Visit (INDEPENDENT_AMBULATORY_CARE_PROVIDER_SITE_OTHER): Payer: BC Managed Care – PPO | Admitting: Physician Assistant

## 2012-03-25 VITALS — BP 126/82 | HR 80 | Temp 98.7°F | Resp 16 | Ht 67.5 in | Wt 173.6 lb

## 2012-03-25 DIAGNOSIS — E010 Iodine-deficiency related diffuse (endemic) goiter: Secondary | ICD-10-CM

## 2012-03-25 DIAGNOSIS — F3281 Premenstrual dysphoric disorder: Secondary | ICD-10-CM | POA: Insufficient documentation

## 2012-03-25 DIAGNOSIS — N943 Premenstrual tension syndrome: Secondary | ICD-10-CM

## 2012-03-25 DIAGNOSIS — E049 Nontoxic goiter, unspecified: Secondary | ICD-10-CM

## 2012-03-25 DIAGNOSIS — F39 Unspecified mood [affective] disorder: Secondary | ICD-10-CM

## 2012-03-25 DIAGNOSIS — J309 Allergic rhinitis, unspecified: Secondary | ICD-10-CM

## 2012-03-25 DIAGNOSIS — G4700 Insomnia, unspecified: Secondary | ICD-10-CM

## 2012-03-25 DIAGNOSIS — N946 Dysmenorrhea, unspecified: Secondary | ICD-10-CM | POA: Insufficient documentation

## 2012-03-25 DIAGNOSIS — F5104 Psychophysiologic insomnia: Secondary | ICD-10-CM | POA: Insufficient documentation

## 2012-03-25 MED ORDER — ZOLPIDEM TARTRATE 10 MG PO TABS: 10.0000 mg | ORAL_TABLET | Freq: Every evening | ORAL | Status: DC | PRN

## 2012-03-25 MED ORDER — CYCLOBENZAPRINE HCL 5 MG PO TABS: ORAL_TABLET | ORAL | Status: AC

## 2012-03-25 MED ORDER — HYDROCODONE-ACETAMINOPHEN 7.5-325 MG PO TABS: 1.0000 | ORAL_TABLET | Freq: Four times a day (QID) | ORAL | Status: AC | PRN

## 2012-03-25 MED ORDER — NABUMETONE 750 MG PO TABS: 750.0000 mg | ORAL_TABLET | Freq: Two times a day (BID) | ORAL | Status: DC | PRN

## 2012-03-25 NOTE — Patient Instructions (Signed)
Keep the appointment with Dr. Evelene Croon.  Bring the FMLA papers for me to complete.

## 2012-03-25 NOTE — Progress Notes (Signed)
  Subjective:    Patient ID: Barbara Rogers, female    DOB: [birth year], 31 y.o.   MRN: [id number]  HPI This 31 y.o. Female presents for dysmenorrhea.  This has been a long-time problem, but getting worse.  Over the past year, her periods been preceded by more days of cramping and spotting.  The cramping is more severe and her emotional lability is worse. She is having to call in to and leave early from work more frequently.  About every third month "it's really bad."  In between, less so.    She uses Paragard to prevent pregnancy, and unfortunately was not able to tolerate hormones in attempt to reduce her menses.  She saw Dr. Evelene Croon and tried Paxil (caused severe GI symptoms) and Lamictal (ineffective).  She has an appointment with Dr. Evelene Croon next month to see what other options she has.  She has obtained FMLA papers from work and intends to bring them in for me to complete.  Review of Systems As above.   Past Medical History  Diagnosis Date  . Allergy   . Migraine   . Anxiety   . Depression   . Dysmenorrhea   . PMDD (premenstrual dysphoric disorder)   . Insomnia   . Nephrolithiasis     Prior to Admission medications   Medication Sig Start Date End Date Taking? Authorizing Provider  IUD's [facility] INTRAUTERINE COPPER IU) 1 Units by Intrauterine route.   Yes Historical Provider, MD  LORazepam (ATIVAN) 1 MG tablet Take 1/2 to 1 tablet by mouth twice a day as needed 03/11/12  Yes Anders Simmonds, PA-C  Multiple Vitamins-Minerals (MULTIVITAMINS THER. W/MINERALS) TABS Take 1 tablet by mouth daily.    Yes Historical Provider, MD  Ambien 5 mg 1 po QHS       Allergies  Allergen Reactions  . Penicillins     unknown  . Progestins     moody   History   Social History  . Marital Status: Single    Spouse Name: N/A    Number of Children: 0  . Years of Education: 12   Occupational History  . [employer]   . [employer]        Social History Main Topics  . Smoking status: Current Everyday Smoker  -- 1.0 packs/day    Types: Cigarettes  . Smokeless tobacco: Never Used  . Alcohol Use: Yes  . Drug Use: No  . Sexually Active: Yes -- Female partner(s)    Birth Control/ Protection: IUD   Family History  Problem Relation Age of Onset  . Migraines Mother   . Mental illness Mother       Objective:   Physical Exam  Vital signs noted. Well-developed, well nourished WF who is awake, alert and oriented, in NAD. HEENT: [place]/AT, sclera and conjunctiva are clear.   Neck: supple, non-tender, no lymphadenopathy. Today she has thyromegaly. Heart: RRR, no murmur Lungs: CTA Extremities: no cyanosis, clubbing or edema. Skin: warm and dry without rash.     Assessment & Plan:   1. Dysmenorrhea  cyclobenzaprine (FLEXERIL) 5 MG tablet, HYDROcodone-acetaminophen (NORCO) 7.5-325 MG per tablet, nabumetone (RELAFEN) 750 MG tablet  2. PMDD (premenstrual dysphoric disorder)    3. Thyromegaly  TSH  4. Insomnia  zolpidem (AMBIEN) 10 MG tablet   Unfortunately, the patient left without having her blood drawn.  I called and left a message for her to return for lab draw.

## 2012-03-26 ENCOUNTER — Telehealth: Payer: Self-pay

## 2012-03-26 NOTE — Telephone Encounter (Signed)
Pt. Was seen yesterday by [name] and needs note to be excused from work for today. Pt also stated Needs separate note to be excused from work tomorrow just in case not feeling better. Please let  Pt know when able to pick up

## 2012-03-26 NOTE — Telephone Encounter (Signed)
Ok to send notes.

## 2012-03-27 NOTE — Telephone Encounter (Signed)
Please call patient at [phone] when note ready for pick up-The patient needs this note before Monday 03/30/12.

## 2012-03-28 NOTE — Telephone Encounter (Signed)
[facility] notifying patient that notes are in pick up drawer.

## 2012-04-02 ENCOUNTER — Ambulatory Visit (INDEPENDENT_AMBULATORY_CARE_PROVIDER_SITE_OTHER): Payer: BC Managed Care – PPO | Admitting: Family Medicine

## 2012-04-02 VITALS — BP 110/70 | HR 108 | Temp 98.0°F | Resp 16 | Ht 67.0 in | Wt 173.0 lb

## 2012-04-02 DIAGNOSIS — E010 Iodine-deficiency related diffuse (endemic) goiter: Secondary | ICD-10-CM

## 2012-04-02 DIAGNOSIS — H6690 Otitis media, unspecified, unspecified ear: Secondary | ICD-10-CM

## 2012-04-02 DIAGNOSIS — H6691 Otitis media, unspecified, right ear: Secondary | ICD-10-CM

## 2012-04-02 DIAGNOSIS — J069 Acute upper respiratory infection, unspecified: Secondary | ICD-10-CM

## 2012-04-02 DIAGNOSIS — J40 Bronchitis, not specified as acute or chronic: Secondary | ICD-10-CM

## 2012-04-02 DIAGNOSIS — J029 Acute pharyngitis, unspecified: Secondary | ICD-10-CM

## 2012-04-02 DIAGNOSIS — E049 Nontoxic goiter, unspecified: Secondary | ICD-10-CM

## 2012-04-02 LAB — THYROID PANEL WITH TSH
Free Thyroxine Index: 2.8 (ref 1.0–3.9)
T3 Uptake: 36.9 % (ref 22.5–37.0)

## 2012-04-02 MED ORDER — CLARITHROMYCIN 500 MG PO TABS: 500.0000 mg | ORAL_TABLET | Freq: Two times a day (BID) | ORAL | Status: DC

## 2012-04-02 MED ORDER — HYDROCODONE-HOMATROPINE 5-1.5 MG/5ML PO SYRP: 5.0000 mL | ORAL_SOLUTION | Freq: Three times a day (TID) | ORAL | Status: AC | PRN

## 2012-04-02 MED ORDER — FLUTICASONE PROPIONATE 50 MCG/ACT NA SUSP: NASAL | Status: DC

## 2012-04-02 NOTE — Progress Notes (Signed)
Subjective: 31 year old patient of Francia Greaves PA who was here last week. She thinks she may have gotten sick from something she picked up here. She been ill with a respiratory tract infection, blowing green gunk from her nose, hurting in her right ear, very sore throat, and persistent cough. She has a history of a lot of sinus infections. She does continue to smoke one pack of cigarettes per day. She works at [employer] and a lot of dust. Also works at [employer].  Last week was told that her thyroid was enlarged, and did not get her blood done at that time but would like to go in and get it done today.  Objective: Somewhat ill-appearing young lady, constantly coughing and sniffling. Her TMs normal on the left. The right eardrum is erythematous with a yellowish pocket of pus in the posterior inferior quadrant. Throat is generally erythematous but no exudate. Her neck had the appearance of thyromegaly, and probably the thyroid gland is enlarged. Although she has a constant cough, the chest sounded clear to auscultation.  Assessment: Right otitis media URI Pharyngitis Bronchitis Thyromegaly  Plan: Check her thyroid blood tests Treated with antibiotics

## 2012-04-02 NOTE — Patient Instructions (Signed)
Stop smoking

## 2012-04-05 ENCOUNTER — Encounter: Payer: Self-pay | Admitting: Radiology

## 2012-04-05 ENCOUNTER — Ambulatory Visit (INDEPENDENT_AMBULATORY_CARE_PROVIDER_SITE_OTHER): Payer: BC Managed Care – PPO | Admitting: Emergency Medicine

## 2012-04-05 ENCOUNTER — Encounter: Payer: Self-pay | Admitting: *Deleted

## 2012-04-05 ENCOUNTER — Ambulatory Visit: Payer: BC Managed Care – PPO

## 2012-04-05 VITALS — BP 121/76 | HR 108 | Temp 99.3°F | Resp 18 | Ht 67.25 in | Wt 172.8 lb

## 2012-04-05 DIAGNOSIS — J45909 Unspecified asthma, uncomplicated: Secondary | ICD-10-CM

## 2012-04-05 DIAGNOSIS — R062 Wheezing: Secondary | ICD-10-CM

## 2012-04-05 DIAGNOSIS — R509 Fever, unspecified: Secondary | ICD-10-CM

## 2012-04-05 DIAGNOSIS — R0602 Shortness of breath: Secondary | ICD-10-CM

## 2012-04-05 DIAGNOSIS — R059 Cough, unspecified: Secondary | ICD-10-CM

## 2012-04-05 DIAGNOSIS — R05 Cough: Secondary | ICD-10-CM

## 2012-04-05 LAB — POCT CBC
Granulocyte percent: 79.6 %G (ref 37–80)
HCT, POC: 45.5 % (ref 37.7–47.9)
Hemoglobin: 14.3 g/dL (ref 12.2–16.2)
POC Granulocyte: 8.2 — AB (ref 2–6.9)
RBC: 4.66 M/uL (ref 4.04–5.48)

## 2012-04-05 MED ORDER — PREDNISONE 10 MG PO TABS: ORAL_TABLET | ORAL | Status: DC

## 2012-04-05 MED ORDER — ALBUTEROL SULFATE (2.5 MG/3ML) 0.083% IN NEBU
2.5000 mg | INHALATION_SOLUTION | Freq: Once | RESPIRATORY_TRACT | Status: AC
  Administered 2012-04-05: 2.5 mg via RESPIRATORY_TRACT

## 2012-04-05 MED ORDER — ALBUTEROL SULFATE HFA 108 (90 BASE) MCG/ACT IN AERS: 2.0000 | INHALATION_SPRAY | RESPIRATORY_TRACT | Status: DC | PRN

## 2012-04-05 MED ORDER — HYDROCOD POLST-CHLORPHEN POLST 10-8 MG/5ML PO LQCR: 5.0000 mL | Freq: Two times a day (BID) | ORAL | Status: DC | PRN

## 2012-04-05 MED ORDER — METHYLPREDNISOLONE ACETATE 80 MG/ML IJ SUSP
80.0000 mg | Freq: Once | INTRAMUSCULAR | Status: AC
  Administered 2012-04-05: 80 mg via INTRAMUSCULAR

## 2012-04-05 MED ORDER — IPRATROPIUM BROMIDE 0.02 % IN SOLN
0.5000 mg | Freq: Once | RESPIRATORY_TRACT | Status: AC
  Administered 2012-04-05: 0.5 mg via RESPIRATORY_TRACT

## 2012-04-05 NOTE — Progress Notes (Signed)
  Subjective:    Patient ID: Barbara Rogers, female    DOB: 1981/03/31, 31 y.o.   MRN: [id number]  HPI patient states she's been sick for the last week. She was seen here on Thursday and diagnosed with a bronchitis and sinus infection the she was started on antibiotic since then she's developed increasing chest tightness wheezing difficulty breathing. She is a heavy smoker.    Review of Systems     Objective:   Physical Exam  Constitutional: She appears well-developed and well-nourished.  HENT:  Head: Normocephalic and atraumatic.  Eyes: Pupils are equal, round, and reactive to light.  Neck: No tracheal deviation present. No thyromegaly present.  Cardiovascular: Normal rate and regular rhythm.   Pulmonary/Chest: No respiratory distress. She has wheezes. She has no rales.       She has significant bilateral wheezes and prolonged expiration.  Abdominal: Soft. There is no tenderness.   [facility] reading (PRIMARY) by  Dr. Cleta Alberts mild increased basilar markings no consolidative infiltrates.   Results for orders placed in visit on 04/05/12  POCT CBC      Component Value Range   WBC 10.3 (*) 4.6 - 10.2 K/uL   Lymph, poc 1.4  0.6 - 3.4   POC LYMPH PERCENT 13.6  10 - 50 %L   MID (cbc) 0.7  0 - 0.9   POC MID % 6.8  0 - 12 %M   POC Granulocyte 8.2 (*) 2 - 6.9   Granulocyte percent 79.6  37 - 80 %G   RBC 4.66  4.04 - 5.48 M/uL   Hemoglobin 14.3  12.2 - 16.2 g/dL   HCT, POC 40.9  81.1 - 47.9 %   MCV 97.7 (*) 80 - 97 fL   MCH, POC 30.7  27 - 31.2 pg   MCHC 31.4 (*) 31.8 - 35.4 g/dL   RDW, POC 91.4     Platelet Count, POC 235  142 - 424 K/uL   MPV 11.1  0 - 99.8 fL          Assessment & Plan:  White count is up slightly . There is no consolidative infiltrate. We'll get a shot of Depo-Medrol along with six-day prednisone taper. She will be prescribed an albuterol HFA inhaler.

## 2012-04-05 NOTE — Patient Instructions (Signed)

## 2012-04-07 ENCOUNTER — Telehealth: Payer: Self-pay

## 2012-04-07 DIAGNOSIS — R059 Cough, unspecified: Secondary | ICD-10-CM

## 2012-04-07 DIAGNOSIS — R05 Cough: Secondary | ICD-10-CM

## 2012-04-07 DIAGNOSIS — J45909 Unspecified asthma, uncomplicated: Secondary | ICD-10-CM

## 2012-04-07 NOTE — Telephone Encounter (Signed)
?   Ok for RTW note?  Can we rx levaquin?

## 2012-04-07 NOTE — Telephone Encounter (Signed)
Pt needs a note to return to work tomorrow - Wednesday.   Please [phone]  Dr. Cleta Alberts - patient would like levoquin. - [facility].

## 2012-04-08 NOTE — Telephone Encounter (Signed)
OK for work note.  Dr. Cleta Alberts- I don't see a note about an antibiotic...please advise.

## 2012-04-08 NOTE — Telephone Encounter (Signed)
It is okay to give patient a work note until Monday. He is okay for her to stop her Biaxin. She can start Levaquin 500 mg one daily x7 days. Okay to send to the pharmacy.

## 2012-04-09 ENCOUNTER — Other Ambulatory Visit: Payer: Self-pay | Admitting: Physician Assistant

## 2012-04-09 MED ORDER — LEVOFLOXACIN 500 MG PO TABS: 500.0000 mg | ORAL_TABLET | Freq: Every day | ORAL | Status: AC

## 2012-04-09 MED ORDER — HYDROCOD POLST-CHLORPHEN POLST 10-8 MG/5ML PO LQCR: 5.0000 mL | Freq: Two times a day (BID) | ORAL | Status: DC | PRN

## 2012-04-09 MED ORDER — LORAZEPAM 1 MG PO TABS: ORAL_TABLET | ORAL | Status: DC

## 2012-04-09 NOTE — Telephone Encounter (Signed)
Lm for her to call me back concerning dates for work note and have sent in Levaquin to pharmacy.

## 2012-04-09 NOTE — Telephone Encounter (Signed)
Spoke with patient, no longer needing note for work.  Plans to RTW today or tomorrow.  Advised patient Levaquin called in, and patient would also like refill of Tussionex.  Spoke with Dr. Cleta Alberts and he is ok refilling Tussionex 60 ml x 1, to only use at night (Mucinex during the day) and RTC if no improvement.  Patient understood.

## 2012-04-15 ENCOUNTER — Telehealth: Payer: Self-pay

## 2012-04-15 NOTE — Telephone Encounter (Signed)
Spoke w/pt who clarified that what she would like is a new OOW note that will cover her from 04/02/12 (the date of her 1st OV) through last Fri which was 04/10/12. She stated the MD who saw her on the 28th said he would write her out through 8/2, but at the time she didn't think she needed the note. Can we do this?

## 2012-04-15 NOTE — Telephone Encounter (Signed)
We have not seen this patient since the original OV on 04/02/12 and he only indicated that an OOW note was untile 8/1.  She had no contact with [place] to indicate that she was still that ill.  I am not comfortable with this unless Dr. Cleta Alberts authorizes or there are new details that would cause me to reconsider.

## 2012-04-15 NOTE — Telephone Encounter (Signed)
Pt needs out of work note to state okay to return to work on Friday 10 aug  [phone] (

## 2012-04-16 ENCOUNTER — Telehealth: Payer: Self-pay | Admitting: Radiology

## 2012-04-16 NOTE — Telephone Encounter (Signed)
When we spoke to patient on 7/30 she had plans to return to work what changed? I have called patient. Her employer sent her home the rest of the week when she returned on 7/30 Patient was out on 04/02/12 until 04/10/12 according to our last conversation she did not need the note, I have told her I can not extend the note until this Friday, she does not need this just 04/02/12 until 04/10/12. I think this is more reasonable, do you agree?

## 2012-04-16 NOTE — Telephone Encounter (Signed)
Ok to do.  Discussed with Amy Littrell.

## 2012-04-16 NOTE — Telephone Encounter (Signed)
Patient aware her work note is ready.

## 2012-04-22 ENCOUNTER — Telehealth: Payer: Self-pay | Admitting: Radiology

## 2012-04-22 MED ORDER — HYDROCODONE-ACETAMINOPHEN 7.5-325 MG PO TABS: 1.0000 | ORAL_TABLET | Freq: Four times a day (QID) | ORAL | Status: AC | PRN

## 2012-04-23 NOTE — Telephone Encounter (Signed)
Medication encounter only

## 2012-05-17 ENCOUNTER — Telehealth: Payer: Self-pay

## 2012-05-17 MED ORDER — HYDROCODONE-ACETAMINOPHEN 7.5-325 MG PO TABS: 1.0000 | ORAL_TABLET | Freq: Four times a day (QID) | ORAL | Status: AC | PRN

## 2012-05-17 NOTE — Telephone Encounter (Signed)
Pt states that she is going on a 2 week cruise and needs a refill on her hydrocodone Best# [phone] Pharmacy: [facility]

## 2012-05-17 NOTE — Telephone Encounter (Signed)
Pt notified and rx faxed to pharmacy.

## 2012-05-17 NOTE — Telephone Encounter (Signed)
Rx done and ready to be sent to pharmacy 

## 2012-06-14 ENCOUNTER — Other Ambulatory Visit: Payer: Self-pay | Admitting: Family Medicine

## 2012-06-21 ENCOUNTER — Other Ambulatory Visit: Payer: Self-pay | Admitting: Physician Assistant

## 2012-06-30 ENCOUNTER — Telehealth: Payer: Self-pay

## 2012-06-30 MED ORDER — CYCLOBENZAPRINE HCL 10 MG PO TABS: 10.0000 mg | ORAL_TABLET | Freq: Three times a day (TID) | ORAL | Status: DC | PRN

## 2012-06-30 MED ORDER — HYDROCODONE-ACETAMINOPHEN 10-325 MG PO TABS: 1.0000 | ORAL_TABLET | Freq: Three times a day (TID) | ORAL | Status: DC | PRN

## 2012-06-30 NOTE — Telephone Encounter (Signed)
Pt states that she does have an appt scheduled in Nov at [facility] that [facility] had recommended. Pt reports that her pain w/her menstrual period is so severe this time that she has been unable to get out of bed to go to work even when taking the Norco 7.5 along w/Advil Q 4 hrs. She tried to work yesterday and she couldn't even stand up and they sent her home. She wanted [name] to know that Dr Evelene Croon did start her on Cymbalta and it has really been helping w/her mood. Pt reqs that [name] send in something stronger for pain so that she will be able to work tomorrow.

## 2012-06-30 NOTE — Telephone Encounter (Signed)
Pt is requesting a stronger pain medication  chelle is provider   [facility] [address] On [facility]alta working good for everything else. Taking advil with prescribed pain medication.  Has an appointment with specialist in a month.   [phone]

## 2012-06-30 NOTE — Telephone Encounter (Signed)
Spoke with pt advised RX sent to pharmacy.

## 2012-06-30 NOTE — Telephone Encounter (Signed)
Please advise patient that I've increased the Norco to 10/325 and added cyclobenzaprine.  She can pick up the Norco, or we can call or fax it to her pharmacy.

## 2012-07-22 ENCOUNTER — Other Ambulatory Visit: Payer: Self-pay | Admitting: Physician Assistant

## 2012-08-03 ENCOUNTER — Other Ambulatory Visit: Payer: Self-pay | Admitting: Physician Assistant

## 2012-08-04 ENCOUNTER — Other Ambulatory Visit: Payer: Self-pay | Admitting: Physician Assistant

## 2012-08-04 ENCOUNTER — Other Ambulatory Visit: Payer: Self-pay | Admitting: *Deleted

## 2012-08-09 DIAGNOSIS — G8929 Other chronic pain: Secondary | ICD-10-CM

## 2012-08-09 HISTORY — DX: Other chronic pain: G89.29

## 2012-08-10 ENCOUNTER — Ambulatory Visit (INDEPENDENT_AMBULATORY_CARE_PROVIDER_SITE_OTHER): Payer: BC Managed Care – PPO | Admitting: Internal Medicine

## 2012-08-10 VITALS — BP 114/70 | HR 85 | Temp 98.0°F | Resp 18 | Ht 67.4 in | Wt 164.4 lb

## 2012-08-10 DIAGNOSIS — M62838 Other muscle spasm: Secondary | ICD-10-CM

## 2012-08-10 DIAGNOSIS — M542 Cervicalgia: Secondary | ICD-10-CM

## 2012-08-10 MED ORDER — HYDROCODONE-ACETAMINOPHEN 10-325 MG PO TABS: 1.0000 | ORAL_TABLET | Freq: Three times a day (TID) | ORAL | Status: DC | PRN

## 2012-08-10 MED ORDER — DIAZEPAM 5 MG PO TABS: ORAL_TABLET | ORAL | Status: DC

## 2012-08-10 MED ORDER — PREDNISONE 10 MG PO TABS: ORAL_TABLET | ORAL | Status: DC

## 2012-08-10 NOTE — Progress Notes (Signed)
   [address], [place] [address]   Phone [phone]  Subjective:    Patient ID: Barbara Rogers, female    DOB: 1981-02-14, 31 y.o.   MRN: [id number]  HPI  Pt presents to clinic with L shoulder pain that she feels like is related to her job at [employer] and the busy season.  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  She is trying to work as much overtime as she can because she is trying to pay off her car and then get money to pay for a Hyst in the spring.  It hurts to move her arm in one area in her trapezius area - it burns and hurts.  She has flexeril and it has not helped her pain at all.  She has Norco for her menstrual cramps and she has been using that but it is not helping at all.    Review of Systems  Neurological: Negative for weakness and numbness.       Objective:   Physical Exam  Vitals reviewed. Constitutional: She is oriented to person, place, and time. She appears well-developed and well-nourished.  Pulmonary/Chest: Effort normal.  Musculoskeletal:       Arms: Neurological: She is alert and oriented to person, place, and time.  Skin: Skin is warm and dry.  Psychiatric: She has a normal mood and affect. Her behavior is normal. Judgment and thought content normal.          Assessment & Plan:   1. Muscle spasms of neck  diazepam (VALIUM) 5 MG tablet, predniSONE (DELTASONE) 10 MG tablet, HYDROcodone-acetaminophen (NORCO) 10-325 MG per tablet  2. Neck pain on left side      D/w pt possible trigger point injection.  She will try the valium and if no help we will try trigger point injection but from my research it looks like pt needs to take a few days of rest after the injection for it is be successful and pt does not want to take any time off work due to it being the busy season and wanting to get lots of overtime.  Pt to use heat and massage.  D/w Dr Merla Riches.

## 2012-08-10 NOTE — Patient Instructions (Addendum)
Find area marked on shoulder, get Barbara Rogers to push as hard as he can with his thumb or knuckle to help break up the spasm.

## 2012-08-11 ENCOUNTER — Telehealth: Payer: Self-pay

## 2012-08-11 NOTE — Telephone Encounter (Signed)
PT STATES CALLED IN EARLIER TODAY, WAS SEEN LAST NIGHT REGARDING SHOULDER PAIN/MUSCLE SPASMS. PT IS UNABLE TO WORK AND WANTS TO KNOW IF SHE CAN GET A LANOCANE PATCH FOR PAIN SO SHE CAN WORK PLEAS CALL PT ADVISE

## 2012-08-12 MED ORDER — LIDOCAINE 5 % EX PTCH: 1.0000 | MEDICATED_PATCH | CUTANEOUS | Status: DC

## 2012-08-12 NOTE — Telephone Encounter (Signed)
I have sent a small quantity of these to her pharmacy.  It is possible that she may need to take time off work to allow the shoulder to heal.  If she continues to have pain, she needs to RTC to further discuss trigger point injection as discussed at last visit

## 2012-08-13 NOTE — Telephone Encounter (Signed)
LMOM and notified pt that Rx was sent in, but most often insurance will not cover these patches. Also gave pt instr's to RTC if pain persists to discuss possible injection.

## 2012-08-19 ENCOUNTER — Other Ambulatory Visit: Payer: Self-pay | Admitting: Physician Assistant

## 2012-08-26 ENCOUNTER — Telehealth: Payer: Self-pay

## 2012-08-26 ENCOUNTER — Encounter: Payer: Self-pay | Admitting: Physician Assistant

## 2012-08-26 NOTE — Telephone Encounter (Signed)
Patient is requesting out of work note for today and tomorrow.   CB[employer]?  [phone]

## 2012-08-26 NOTE — Telephone Encounter (Signed)
lmom that pt must be evaluated before a note can be written

## 2012-08-27 ENCOUNTER — Other Ambulatory Visit: Payer: Self-pay | Admitting: Physician Assistant

## 2012-08-28 ENCOUNTER — Other Ambulatory Visit: Payer: Self-pay

## 2012-08-31 ENCOUNTER — Other Ambulatory Visit: Payer: Self-pay | Admitting: Physician Assistant

## 2012-09-05 ENCOUNTER — Telehealth: Payer: Self-pay

## 2012-09-05 NOTE — Telephone Encounter (Signed)
Patient is trying to get norco refilled. It was denied a few days ago but she is going on vacation so she is hoping we can refill it a bit earlier than usual.  Best [phone]

## 2012-09-06 NOTE — Telephone Encounter (Signed)
She is not due for a refill until 09/27/12. If she plans to be out of town through that date we may be able to do this, but that is 3 weeks away.

## 2012-09-06 NOTE — Telephone Encounter (Signed)
Called her to advise.  

## 2012-09-09 DIAGNOSIS — Z0271 Encounter for disability determination: Secondary | ICD-10-CM

## 2012-09-19 ENCOUNTER — Other Ambulatory Visit: Payer: Self-pay | Admitting: Physician Assistant

## 2012-09-20 ENCOUNTER — Telehealth: Payer: Self-pay

## 2012-09-20 ENCOUNTER — Other Ambulatory Visit: Payer: Self-pay | Admitting: Physician Assistant

## 2012-09-20 NOTE — Telephone Encounter (Signed)
lmom to cb.  rx for hydrocodone sent to pharmacy

## 2012-09-20 NOTE — Telephone Encounter (Signed)
Pt was calling in regards to pain meds and advised her that rx was sent in

## 2012-09-20 NOTE — Telephone Encounter (Signed)
PT WOULD LIKE A REVIEW OF HER MEDICATIONS   PLEASE CALL [phone]

## 2012-09-30 ENCOUNTER — Other Ambulatory Visit: Payer: Self-pay | Admitting: Physician Assistant

## 2012-10-08 ENCOUNTER — Other Ambulatory Visit: Payer: Self-pay | Admitting: Physician Assistant

## 2012-10-11 ENCOUNTER — Other Ambulatory Visit: Payer: Self-pay | Admitting: Physician Assistant

## 2012-10-14 ENCOUNTER — Telehealth: Payer: Self-pay

## 2012-10-14 NOTE — Telephone Encounter (Signed)
Unfortunately, [name] does not do these injections. She will have to follow up with orthopedics. We can refer her to a different clinic if she was unhappy with the one she had prior?

## 2012-10-14 NOTE — Telephone Encounter (Signed)
Thanks I have called patient to advise. She is asking if Dr Neva Seat will see her to inject this it is behind her left scapula, please advise if you would be willing to do a trigger point injection for her? She had one previously at [facility]opedic urgent care but does not want to go back there.

## 2012-10-14 NOTE — Telephone Encounter (Signed)
PT USUALLY SEES [name].  SHE WANTS TO KNOW IF [name] CAN DO HER TRIGGER POINT INJECTIONS INTO HER SHOULDER.  WOULD RATHER NOT GO BACK TO THE ORTHOPAEDIC THAT GAVE HER THE LAST ONE.  SHE SAYS IT WAS NOT A GOOD EXPERIENCE.  CALL [phone]

## 2012-10-15 NOTE — Telephone Encounter (Signed)
Patient advised and she will make appt. With Dr Neva Seat.

## 2012-10-15 NOTE — Telephone Encounter (Signed)
I could possible do one trigger point injection if that is what appears to be necessary at eval in office. Based on the described location, this could be spasm of rhomboid, which we can discuss specific stretches. If trigger point injection needed, would need to have some rest time after injection.    If there is a slower time at work, this may be best.  If she needs repetitive trigger point infections, may need ortho or PM and R eval.

## 2012-10-20 ENCOUNTER — Telehealth: Payer: Self-pay

## 2012-10-20 NOTE — Telephone Encounter (Signed)
Please get details. What would we be writing patient out of work for?

## 2012-10-20 NOTE — Telephone Encounter (Signed)
PT STATES SHE HASN'T HAD THE MONEY TO GET HER FMLA PAPERS FILLED OUT, BUT WOULD LIKE TO KNOW IF [name] WOULD WRITE HER A NOTE TO BE OOW FOR Thursday AND Friday SO SHE WON'T GET IN TROUBLE. PLEASE CALL [phone]

## 2012-10-21 NOTE — Telephone Encounter (Signed)
Patient has indicated she had trigger point injection done at [facility], by Dr Althea Charon, but she wants to see Dr Neva Seat, because she did not care for Dr Althea Charon. She c/o shoulder pain/ neck pain. Please advise.

## 2012-10-22 NOTE — Telephone Encounter (Signed)
Please clarify which dates she needs off work, and the specific reason (get the details related to her injection: date, what instructions she was given, follow-up plan, etc).  I can't write her a note for something I didn't see her for.  If she was given a note from Dr. Althea Charon and needs it extended, then she needs to call that office.

## 2012-10-23 NOTE — Telephone Encounter (Signed)
Spoke to patient. She was out of work on Thursday and Friday. She states she has been out of work for her shoulder. But these 2 days were missed due to her menstral cycle. Please advise if this is okay.

## 2012-10-24 NOTE — Telephone Encounter (Signed)
Spoke with patient and she states that [name] has filled out [employer] paper work before for PMDD. [employer] has to have updated FMLA at the beginning of each year and she is going to bring in to fill out as soon as she gets the money to pay for them to be filled out. But in the mean time would like a OOW note for those dates due to PMDD. Please advise

## 2012-10-24 NOTE — Telephone Encounter (Signed)
I do not think it is appropriate for [place] to write her out of work for her menstrual cycle.  We have not see her since early December, and that was for an unrelated issue

## 2012-10-25 ENCOUNTER — Ambulatory Visit (INDEPENDENT_AMBULATORY_CARE_PROVIDER_SITE_OTHER): Payer: BC Managed Care – PPO | Admitting: Family Medicine

## 2012-10-25 VITALS — BP 112/76 | HR 72 | Temp 98.0°F | Resp 16 | Ht 67.4 in | Wt 180.0 lb

## 2012-10-25 DIAGNOSIS — E049 Nontoxic goiter, unspecified: Secondary | ICD-10-CM

## 2012-10-25 DIAGNOSIS — N943 Premenstrual tension syndrome: Secondary | ICD-10-CM

## 2012-10-25 DIAGNOSIS — F3281 Premenstrual dysphoric disorder: Secondary | ICD-10-CM

## 2012-10-25 DIAGNOSIS — N946 Dysmenorrhea, unspecified: Secondary | ICD-10-CM

## 2012-10-25 MED ORDER — HYDROCODONE-ACETAMINOPHEN 10-325 MG PO TABS: ORAL_TABLET | ORAL | Status: DC

## 2012-10-25 NOTE — Progress Notes (Signed)
[facility]:  Office Visit  Chief Complaint:  Chief Complaint  Patient presents with  . Dysmenorrhea    terrible menstrual cramps that started Wednesday. Patient would like her Norco re-filled. she usually see's [name].    HPI: Barbara Rogers is a 32 y.o. female who complains of menstrual cramps every month lasting 7 days each month, she has 8/10 pain and has nausea associated. She has tried Relafen, valium, weaker pain medication. Dr. Audie Box advise patient to get ibuprofen and was given a paraguard. She was on the mirena and did not have improvement, she was emotionally labile. OCP makes her menstrual cramps better but she was mentally unstable. She has been on Prozac and Cymbalta and had decrease appetite. She works at [employer] and this Nov, Dec and January was stressful, and also had shoulder pain. She has 2 normal weeks out of each month. Heavy periods with cramping. No SI/HI.  Thinking of getting an ablation in April, with another OB/GYN after boyfriend gets better s/p car accident.   Past Medical History  Diagnosis Date  . Allergy   . Migraine   . Anxiety   . Depression   . Dysmenorrhea   . PMDD (premenstrual dysphoric disorder)   . Insomnia   . Nephrolithiasis   . Nephrolithiasis    History reviewed. No pertinent past surgical history. History   Social History  . Marital Status: Single    Spouse Name: N/A    Number of Children: 0  . Years of Education: 12   Occupational History  . [employer]  .      [employer]   Social History Main Topics  . Smoking status: Current Every Day Smoker -- 1.00 packs/day    Types: Cigarettes  . Smokeless tobacco: Never Used  . Alcohol Use: Yes     Comment: social  . Drug Use: No  . Sexually Active: Yes -- Female partner(s)    Birth Control/ Protection: IUD   Other Topics Concern  . None   Social History Narrative  . None   Family History  Problem Relation Age of Onset  . Migraines Mother   . Mental illness Mother     Allergies  Allergen Reactions  . Penicillins     unknown  . Progestins     moody   Prior to Admission medications   Medication Sig Start Date End Date Taking? Authorizing Provider  albuterol (PROVENTIL HFA;VENTOLIN HFA) 108 (90 BASE) MCG/ACT inhaler Inhale 2 puffs into the lungs every 4 (four) hours as needed for wheezing (cough, shortness of breath or wheezing.). 04/05/12 04/05/13 Yes Collene Gobble, MD  fluticasone (FLONASE) 50 MCG/ACT nasal spray Use 2 sprays each nostril twice daily for 3 days, then once daily 04/02/12  Yes Peyton Najjar, MD  chlorpheniramine-HYDROcodone [facility] PENNKINETIC ER) 10-8 MG/5ML LQCR Take 5 mLs by mouth every 12 (twelve) hours as needed (cough). 04/09/12   Collene Gobble, MD  cyclobenzaprine (FLEXERIL) 10 MG tablet Take 1 tablet (10 mg total) by mouth 3 (three) times daily as needed for muscle spasms. 06/30/12   [name] Tessa Lerner, PA-C  diazepam (VALIUM) 5 MG tablet Take 2 pills at night and 1 pill in the am for muscle spasm 08/10/12   Morrell Riddle, PA-C  HYDROcodone-acetaminophen (NORCO) 10-325 MG per tablet TAKE 1 TABLET EVERY 8 HOURS AS NEEDED FOR PAIN 10/11/12   Nelva Nay, PA-C  IUD's [facility] INTRAUTERINE COPPER IU) 1 Units by Intrauterine route.  Historical Provider, MD  lidocaine (LIDODERM) 5 % PLACE 1 PATCH ONTO THE SKIN DAILY. REMOVE & DISCARD [facility] WITHIN 12 HOURS 08/31/12   [name] Tessa Lerner, PA-C  LORazepam (ATIVAN) 1 MG tablet Take 1/2 to 1 tablet by mouth twice a day as needed 04/09/12   Anders Simmonds, PA-C  Multiple Vitamins-Minerals (MULTIVITAMINS THER. W/MINERALS) TABS Take 1 tablet by mouth daily.     Historical Provider, MD  nabumetone (RELAFEN) 750 MG tablet Take 1 tablet (750 mg total) by mouth 2 (two) times daily as needed for pain. 03/25/12 03/25/13  [name] S Jeffery, PA-C  predniSONE (DELTASONE) 10 MG tablet 6-1 taper - take the days meds early in day all at once with food 08/10/12   Morrell Riddle, PA-C  promethazine (PHENERGAN) 25 MG  tablet TAKE 1 TABLET EVERY 8 HOURS AS NEEDED FOR NAUSEA 08/03/12   Anders Simmonds, PA-C  zolpidem (AMBIEN) 10 MG tablet Take 10 mg by mouth at bedtime as needed.    Historical Provider, MD     ROS: The patient denies fevers, chills, night sweats, unintentional weight loss, chest pain, palpitations, wheezing, dyspnea on exertion, vomiting,  dysuria, hematuria, melena, numbness, weakness, or tingling. + abdominal pain, nausea, menstrual cramps.   All other systems have been reviewed and were otherwise negative with the exception of those mentioned in the HPI and as above.    PHYSICAL EXAM: Filed Vitals:   10/25/12 0911  BP: 112/76  Pulse: 72  Temp: 98 F (36.7 C)  Resp: 16   Filed Vitals:   10/25/12 0911  Height: 5' 7.4" (1.712 m)  Weight: 180 lb (81.647 kg)   Body mass index is 27.86 kg/(m^2).  General: Alert, no acute distress HEENT:  Normocephalic, atraumatic, oropharynx patent. + ? Thick neck, thyroid goiter Cardiovascular:  Regular rate and rhythm, no rubs murmurs or gallops.  No Carotid bruits, radial pulse intact. No pedal edema.  Respiratory: Clear to auscultation bilaterally.  No wheezes, rales, or rhonchi.  No cyanosis, no use of accessory musculature GI: No organomegaly, abdomen is soft and non-tender, positive bowel sounds.  No masses. Skin: No rashes. Neurologic: Facial musculature symmetric. Psychiatric: Patient is appropriate throughout our interaction. Lymphatic: No cervical lymphadenopathy Musculoskeletal: Gait intact.   LABS: Results for orders placed in visit on 04/05/12  POCT CBC      Result Value Range   WBC 10.3 (*) 4.6 - 10.2 K/uL   Lymph, poc 1.4  0.6 - 3.4   POC LYMPH PERCENT 13.6  10 - 50 %L   MID (cbc) 0.7  0 - 0.9   POC MID % 6.8  0 - 12 %M   POC Granulocyte 8.2 (*) 2 - 6.9   Granulocyte percent 79.6  37 - 80 %G   RBC 4.66  4.04 - 5.48 M/uL   Hemoglobin 14.3  12.2 - 16.2 g/dL   HCT, POC 13.0  86.5 - 47.9 %   MCV 97.7 (*) 80 - 97 fL    MCH, POC 30.7  27 - 31.2 pg   MCHC 31.4 (*) 31.8 - 35.4 g/dL   RDW, POC 78.4     Platelet Count, POC 235  142 - 424 K/uL   MPV 11.1  0 - 99.8 fL     EKG/XRAY:   Primary read interpreted by Dr. Conley Rolls at [facility].   ASSESSMENT/PLAN: Encounter Diagnoses  Name Primary?  . Dysmenorrhea Yes  . PMDD (premenstrual dysphoric disorder)   . Thyroid goiter  Pulled narcotic profile. She gets regular Norco from [place] about almost every month Advise patient about abuse potential/dependence considering all other meds she is on. She denies any abuse/divergence. She has tried different things for PMDD including NSAIDs, Tramadol , SSRIs without relief. She is planning to get an ablation perhaps in April. I have advise her that she may like to try other SSRIs or antidepressants to help with her abd pain/PMDD. Her psychiatrist Dr. Lafayette Dragon was recommending Pristique. I advise that she should give it a try if she and Dr. Lafayette Dragon agree. I also told patient that I will not be refilling this on a chronic basis, she needs to make an appt with [name] if she wants this refilled. I would refer her to chronic pain. Patient has thyroid fullness-will get thyroid US.  F/u with Dr. Lafayette Dragon, F/u with Porfirio Oar F/u prn    Michaela Broski PHUONG, DO 10/25/2012 10:37 AM

## 2012-10-26 NOTE — Telephone Encounter (Signed)
Called patient to advise note ready for pick up 

## 2012-10-26 NOTE — Telephone Encounter (Signed)
OK to provide OOW note for PMDD for the dates below.

## 2012-10-31 DIAGNOSIS — S62609A Fracture of unspecified phalanx of unspecified finger, initial encounter for closed fracture: Secondary | ICD-10-CM

## 2012-10-31 HISTORY — DX: Fracture of unspecified phalanx of unspecified finger, initial encounter for closed fracture: S62.609A

## 2012-11-09 ENCOUNTER — Other Ambulatory Visit: Payer: Self-pay | Admitting: Physician Assistant

## 2012-11-10 ENCOUNTER — Telehealth: Payer: Self-pay | Admitting: *Deleted

## 2012-11-10 NOTE — Telephone Encounter (Signed)
Pt states that she has been taking the Norco for her shoulder pain.  She has been going to ortho for that. She has had 2 cortisone shots in her shoulder and she is to follow up with them in a couple of days to have a MRI but she is needing to get some relief because she has been working and lifting boxes.  I advised her that it would probably be best she should call ortho, but she wanted me to send you this note anyway.

## 2012-11-10 NOTE — Telephone Encounter (Signed)
Pt states that she has been taking the Norco for her shoulder pain.  She has been going to ortho for that. She has had 2 cortisone shots in her shoulder and she is to follow up with them in a couple of days to have a MRI but she is needing to get some relief because she has been working and lifting boxes.  I advised her that it would probably be best she should call ortho, but she wanted me to send you this note anyway.  

## 2012-11-10 NOTE — Telephone Encounter (Signed)
Please call this patient.  She got #30 tabs of Norco 10 2 weeks ago.  What's going on that she needs more already?

## 2012-11-10 NOTE — Telephone Encounter (Signed)
This was in rx pool.  Fernande Bras, PA-C at 11/10/2012 12:00 PM   Status: Signed            Please call this patient. She got #30 tabs of Norco 10 2 weeks ago. What's going on that she needs more already?

## 2012-11-14 NOTE — Telephone Encounter (Signed)
Please call the patient.  The Ortho should refill this medication, since she's using it for something different, and clearly is needing it more than she usually does for dysmenorrhea.

## 2012-11-17 ENCOUNTER — Telehealth: Payer: Self-pay

## 2012-11-17 NOTE — Telephone Encounter (Signed)
Pt went and saw her ortho

## 2012-11-17 NOTE — Telephone Encounter (Signed)
Dr Eliberto Ivory would like to talk with dr Milus Glazier about this patient -has a few questions about the fmla paperwork started on 10/26/12  Best number [phone] til 530 today and after 1230 on wednesday

## 2012-11-18 NOTE — Telephone Encounter (Signed)
SPOKE WITH DR Abbe Amsterdam AND HE WOULD LIKE DR KURT TO GIVE HIM A CALL Friday REGARDING PT. HE DOESN'T UNDERSTAND HOW DR KURT CAN HAVE FORMS FILLED OUT WHEN HE HADN'T SEEN PT IN A YEAR (EVEN THOUGH SHE WAS SEEN IN 2014). PLEASE CALL [phone].   [place]MS [place] [id number]

## 2012-11-18 NOTE — Telephone Encounter (Signed)
Dr Milus Glazier, I am routing this back to you since Maudia talked w/ Dr Abbe Amsterdam. Evidently, according to [facility], is an intermittent form that has to be updated every year, and you had originally OKd this?

## 2012-11-18 NOTE — Telephone Encounter (Signed)
I have not seen patient in a year.  Perhaps someone who has seen her more recently should handle this call

## 2012-11-19 NOTE — Telephone Encounter (Signed)
Dr Abbe Amsterdam called back and thanked Dr Milus Glazier for the message he left, but still would like to speak w/Dr L. Will be available today after 4 pm or tomorrow morning after 11 am at [phone].

## 2012-11-21 ENCOUNTER — Other Ambulatory Visit: Payer: Self-pay | Admitting: Physician Assistant

## 2012-11-23 ENCOUNTER — Telehealth: Payer: Self-pay | Admitting: Radiology

## 2012-11-23 ENCOUNTER — Telehealth: Payer: Self-pay | Admitting: Family Medicine

## 2012-11-23 NOTE — Telephone Encounter (Signed)
Pt advised she needs appt she will come in to be seen.

## 2012-11-23 NOTE — Telephone Encounter (Signed)
Dr Carlynn Herald called concerning this patient, she is provided your hours. She expects you to call her tomorrow. States she has been trying to contact you since March 5th her # is [phone] apparently she is calling concerning forms.

## 2012-11-23 NOTE — Telephone Encounter (Signed)
Message copied by Caffie Damme on Mon Nov 23, 2012 12:58 PM ------      Message from: Hamilton Capri P      Created: Mon Nov 23, 2012 11:17 AM       I got a rx request for the above patient for Norco 10. She needs to see [name]. The last time I saw her was in Feb and she was given a 1 month rx and also advise to tald to Dr. Lafayette Dragon and Avelino Leeds about PRistique and narcotic dependence. I am happy to approve this for 2 weeks but she needs to make an appointment to see [name]. If she does not want to do that then I will be happy to refer her to a chronic pain clinic.             Thanks,      Dr. Conley Rolls ------

## 2012-11-26 NOTE — Telephone Encounter (Signed)
I spoke with Dr. Abbe Amsterdam about [name]  I filled out an FMLA form last April 2013 because of recurrent dysmenorrhea, allowing Barbara Rogers days off every month.  The form was renewed (probably my mistake) this last February when I was out of town.  I asked Dr. Abbe Amsterdam to void this form, since Barbara needs to be seen within 2 months of the FMLA completion to make sure the condition still warrants FMLA and there is not some other strategy to relieve her symptoms/check on her status.  Therefore, Barbara Rogers needs to be seen here in the office to have the FMLA form resubmitted.  She will need a pelvic exam and possibly an ultrasound or gyn consultation if the dysmenorrhea is cause of absences from work.  She may see any of the providers here, as I have only seen her that one time.

## 2012-11-27 ENCOUNTER — Ambulatory Visit
Admission: RE | Admit: 2012-11-27 | Discharge: 2012-11-27 | Disposition: A | Discharge status: Home / Self Care | Payer: BC Managed Care – PPO | Source: Ambulatory Visit | Attending: Family Medicine | Admitting: Family Medicine

## 2012-11-27 DIAGNOSIS — E049 Nontoxic goiter, unspecified: Secondary | ICD-10-CM

## 2012-11-30 ENCOUNTER — Telehealth: Payer: Self-pay | Admitting: Radiology

## 2012-11-30 NOTE — Telephone Encounter (Signed)
Message copied by Caffie Damme on Mon Nov 30, 2012 11:19 AM ------      Message from: LE, [place]      Created: Sun Nov 29, 2012  8:29 AM       Please let her know that thyroid is normal in size. If she wants her thyroid results please send to her.             Thanks,      Tle ------

## 2012-11-30 NOTE — Telephone Encounter (Signed)
Called her to advise.  

## 2012-12-09 ENCOUNTER — Ambulatory Visit (INDEPENDENT_AMBULATORY_CARE_PROVIDER_SITE_OTHER): Payer: BC Managed Care – PPO | Admitting: Physician Assistant

## 2012-12-09 VITALS — BP 112/78 | HR 86 | Temp 98.5°F | Resp 16 | Ht 68.5 in | Wt 175.0 lb

## 2012-12-09 DIAGNOSIS — F3281 Premenstrual dysphoric disorder: Secondary | ICD-10-CM

## 2012-12-09 DIAGNOSIS — S62609A Fracture of unspecified phalanx of unspecified finger, initial encounter for closed fracture: Secondary | ICD-10-CM | POA: Insufficient documentation

## 2012-12-09 DIAGNOSIS — F39 Unspecified mood [affective] disorder: Secondary | ICD-10-CM

## 2012-12-09 DIAGNOSIS — G8929 Other chronic pain: Secondary | ICD-10-CM | POA: Insufficient documentation

## 2012-12-09 DIAGNOSIS — S62609D Fracture of unspecified phalanx of unspecified finger, subsequent encounter for fracture with routine healing: Secondary | ICD-10-CM

## 2012-12-09 DIAGNOSIS — M25512 Pain in left shoulder: Secondary | ICD-10-CM | POA: Insufficient documentation

## 2012-12-09 DIAGNOSIS — N946 Dysmenorrhea, unspecified: Secondary | ICD-10-CM

## 2012-12-09 DIAGNOSIS — E049 Nontoxic goiter, unspecified: Secondary | ICD-10-CM

## 2012-12-09 DIAGNOSIS — N943 Premenstrual tension syndrome: Secondary | ICD-10-CM

## 2012-12-09 DIAGNOSIS — E039 Hypothyroidism, unspecified: Secondary | ICD-10-CM | POA: Insufficient documentation

## 2012-12-09 DIAGNOSIS — M25519 Pain in unspecified shoulder: Secondary | ICD-10-CM

## 2012-12-09 DIAGNOSIS — S5290XD Unspecified fracture of unspecified forearm, subsequent encounter for closed fracture with routine healing: Secondary | ICD-10-CM

## 2012-12-09 LAB — TSH: TSH: 5.351 u[IU]/mL — ABNORMAL HIGH (ref 0.350–4.500)

## 2012-12-09 MED ORDER — HYDROCODONE-ACETAMINOPHEN 10-325 MG PO TABS: ORAL_TABLET | ORAL | Status: DC

## 2012-12-09 NOTE — Progress Notes (Signed)
Subjective:    Patient ID: Barbara Rogers, female    DOB: 05-Dec-1980, 32 y.o.   MRN: [id number]  HPI This 32 y.o. female presents for evaluation of dysmenorrhea. She needs FMLA papers re-done.  Earlier this year, she requested the forms be completed to continue coverage of work absences due to severe dysmenorrhea and PMDD.  Unfortunately, they were sent to a provider who doesn't routinely see her, and who hasn't seen her in >12 months, and who then voided the request.  She is now at risk for losing her job.  We have worked hard to find a treatment for her symptoms related to dysmenorrhea, PMDD and menstrually mediated migraines.  She did not tolerate any hormonal treatments, and currently uses a Paragard IUD for contraception, but which does not afford any relief from her symptoms of severe pelvic cramping, very heavy menstrual flow.  She is undergoing evaluation with GYN now, and a hysterectomy vs. Ablation is planned to definitively treat the condition.  The procedure has been delayed due to her partner's neck fracture earlier this year.  In the meantime, she has developed LEFT should pain and fractured the LEFT 4th finger.  She has been evaluated by orthopedics for those injuries, and is to start PT this week.  She expects to get the finger splint off soon. She reports that she's been working through these injuries, but has been able to change to job duties that require less overhead activities.  Additionally, she has had an US of the thyroid to evaluate thyroid enlargement. It revealed a NORMAL sized thyroid gland without nodules. TSH was normal in 03/2012.  It has not been repeated.  Her mood disorder is managed by Dr. Evelene Croon, and likely involves an element of PTSD.  Her symptoms are not yet stable, she is tolerating treatment without adverse effects, and she's recently been referred to another specialist to adjust her regimen.  She does not miss work for this issue.  Past Medical History   Diagnosis Date  . Allergy   . Migraine   . Anxiety   . Depression   . Dysmenorrhea   . PMDD (premenstrual dysphoric disorder)   . Insomnia   . Nephrolithiasis   . Finger fracture, left 10/31/2012    LEFT 4th finger  . Chronic left shoulder pain 08/2012    History reviewed. No pertinent past surgical history.  Prior to Admission medications   Medication Sig Start Date End Date Taking? Authorizing Provider  ALPRAZolam Prudy Feeler) 1 MG tablet Take 1 mg by mouth at bedtime as needed for sleep.   Yes Historical Provider, MD  amphetamine-dextroamphetamine (ADDERALL XR) 20 MG 24 hr capsule Take 20 mg by mouth every morning.   Yes Historical Provider, MD  cyclobenzaprine (FLEXERIL) 10 MG tablet Take 1 tablet (10 mg total) by mouth 3 (three) times daily as needed for muscle spasms. 06/30/12  Yes Menaal Russum S Hazem Kenner, PA-C  DULoxetine (CYMBALTA) 30 MG capsule Take 30 mg by mouth daily.   Yes Historical Provider, MD  IUD's (PARAGARD INTRAUTERINE COPPER IU) 1 Units by Intrauterine route.   Yes Historical Provider, MD  lidocaine (LIDODERM) 5 % PLACE 1 PATCH ONTO THE SKIN DAILY. REMOVE & DISCARD PATCH WITHIN 12 HOURS 08/31/12  Yes Loreto Loescher Tessa Lerner, PA-C  Multiple Vitamins-Minerals (MULTIVITAMINS THER. W/MINERALS) TABS Take 1 tablet by mouth daily.    Yes Historical Provider, MD  zolpidem (AMBIEN) 10 MG tablet Take 10 mg by mouth at bedtime as needed.   Yes Historical Provider,  MD  albuterol (PROVENTIL HFA;VENTOLIN HFA) 108 (90 BASE) MCG/ACT inhaler Inhale 2 puffs into the lungs every 4 (four) hours as needed for wheezing (cough, shortness of breath or wheezing.). 04/05/12 04/05/13  Collene Gobble, MD  fluticasone (FLONASE) 50 MCG/ACT nasal spray Use 2 sprays each nostril twice daily for 3 days, then once daily 04/02/12   Peyton Najjar, MD  HYDROcodone-acetaminophen (NORCO) 10-325 MG per tablet TAKE 1 TABLET EVERY 8 HOURS AS NEEDED FOR PAIN 10/25/12   Thao P Le, DO    Allergies  Allergen Reactions  .  Penicillins     unknown  . Progestins     moody    History   Social History  . Marital Status: Single    Spouse Name: N/A    Number of Children: 0  . Years of Education: 12   Occupational History  . [employer]  .      [employer]   Social History Main Topics  . Smoking status: Current Every Day Smoker -- 1.00 packs/day    Types: Cigarettes  . Smokeless tobacco: Never Used     Comment: using e-cig to try to quit  . Alcohol Use: Yes     Comment: social  . Drug Use: No  . Sexually Active: Yes -- Female partner(s)    Birth Control/ Protection: IUD   Other Topics Concern  . Not on file   Social History Narrative   Lives with her boyfriend.    Family History  Problem Relation Age of Onset  . Migraines Mother   . Mental illness Mother      Review of Systems As above.  No CP, SOB, dizziness, nausea, vomiting, diarrhea, constipation.      Objective:   Physical Exam Blood pressure 112/78, pulse 86, temperature 98.5 F (36.9 C), temperature source Oral, resp. rate 16, height 5' 8.5" (1.74 m), weight 175 lb (79.379 kg), last menstrual period 11/19/2012, SpO2 98.00%. Body mass index is 26.22 kg/(m^2). Well-developed, well nourished WF who is awake, alert and oriented, in NAD. HEENT: [place]/AT, sclera and conjunctiva are clear.   Neck: supple, non-tender, no lymphadenopathy, thyromegaly. Heart: RRR, no murmur Lungs: normal effort, CTA Extremities: no cyanosis, clubbing or edema. Skin: warm and dry without rash. Psychologic: good mood and appropriate affect, normal speech and behavior.     Assessment & Plan:  Dysmenorrhea - Plan: HYDROcodone-acetaminophen (NORCO) 10-325 MG per tablet. The patient will send me a new copy of the FMLA forms and I will complete them. She will proceed with the GYN evaluation for either hysterectomy or ablation.  PMDD (premenstrual dysphoric disorder) - continue with Dr. Evelene Croon and the additional specialist.  Chronic left shoulder pain - continue with  Dr. Glenis Smoker fracture, left, 4th - continue with Dr. Charlett Blake  Thyroid enlargement - Plan: TSH, normal thyroid US  Mood disorder - continue with Dr. Evelene Croon and the additional specialist.

## 2012-12-09 NOTE — Patient Instructions (Signed)
We'll need to get records from orthopedics and GYN (when you go, ask them to send me the notes).

## 2012-12-13 ENCOUNTER — Other Ambulatory Visit: Payer: Self-pay | Admitting: *Deleted

## 2012-12-13 MED ORDER — LEVOTHYROXINE SODIUM 25 MCG PO TABS: 25.0000 ug | ORAL_TABLET | Freq: Every day | ORAL | Status: DC

## 2012-12-18 ENCOUNTER — Encounter: Payer: BC Managed Care – PPO | Admitting: Gynecology

## 2012-12-21 ENCOUNTER — Telehealth: Payer: Self-pay

## 2012-12-21 NOTE — Telephone Encounter (Signed)
Please get the details for the reason she missed work 4/13 and 4/14.

## 2012-12-21 NOTE — Telephone Encounter (Signed)
Pt requesting call back from chelle regarding her flma,needs oow note for 4/13 and 4/14???   Best phone [phone]

## 2012-12-21 NOTE — Telephone Encounter (Signed)
Will you authorize out of work for 12/20/12 and 12/21/12?

## 2012-12-22 NOTE — Telephone Encounter (Signed)
She states it is related to her menstrual cycle. She did work on 12/21/12 but was out on 12/20/12

## 2012-12-22 NOTE — Telephone Encounter (Signed)
She may have a note for 12/20/2012.

## 2012-12-22 NOTE — Telephone Encounter (Signed)
Note provided. Patient advised note at front desk for pick up

## 2012-12-28 ENCOUNTER — Telehealth: Payer: Self-pay

## 2012-12-28 NOTE — Telephone Encounter (Signed)
PT WOULD LIKE TO SPEAK WITH A NURSE AND IT IS STILL REGARDING HER FMLA PAPERS. NOTHING MAUDIA CAN HELP HER WITH PLEASE CALL [phone]

## 2012-12-28 NOTE — Telephone Encounter (Signed)
Called her, she is asking if the occupational nurse has called for Barbara Rogers. I advised her they have not. Patient states the paperwork has still not been approved. Patient states they are trying to get additional information. To you FYI

## 2012-12-29 NOTE — Telephone Encounter (Signed)
Please pursue this.  Is there a number I can call to speak with the occupational nurse?  Do we have the papers somewhere?  Her paper record is in my box in the provider lounge.

## 2012-12-29 NOTE — Telephone Encounter (Signed)
Patient advised the nurse will contact us, I have provided your hours when you are available. They did not tell patient a number you could call, the only number I have in the chart is the doctor who had previously called. I advised patient they are to call and ask for me. Reylynn has my name and number.

## 2013-01-02 ENCOUNTER — Encounter: Payer: Self-pay | Admitting: Physician Assistant

## 2013-01-02 ENCOUNTER — Ambulatory Visit (INDEPENDENT_AMBULATORY_CARE_PROVIDER_SITE_OTHER): Payer: BC Managed Care – PPO | Admitting: Physician Assistant

## 2013-01-02 VITALS — BP 106/72 | HR 80 | Temp 98.3°F | Resp 18 | Ht 68.0 in | Wt 172.0 lb

## 2013-01-02 DIAGNOSIS — R350 Frequency of micturition: Secondary | ICD-10-CM

## 2013-01-02 DIAGNOSIS — R300 Dysuria: Secondary | ICD-10-CM

## 2013-01-02 DIAGNOSIS — N946 Dysmenorrhea, unspecified: Secondary | ICD-10-CM

## 2013-01-02 LAB — POCT URINALYSIS DIPSTICK
Bilirubin, UA: NEGATIVE
Glucose, UA: NEGATIVE
Ketones, UA: NEGATIVE
Nitrite, UA: NEGATIVE

## 2013-01-02 LAB — POCT UA - MICROSCOPIC ONLY
Mucus, UA: NEGATIVE
Yeast, UA: NEGATIVE

## 2013-01-02 MED ORDER — PHENAZOPYRIDINE HCL 100 MG PO TABS
200.0000 mg | ORAL_TABLET | Freq: Once | ORAL | Status: AC
  Administered 2013-01-02: 200 mg via ORAL

## 2013-01-02 MED ORDER — CIPROFLOXACIN HCL 500 MG PO TABS: 500.0000 mg | ORAL_TABLET | Freq: Two times a day (BID) | ORAL | Status: DC

## 2013-01-02 MED ORDER — PHENAZOPYRIDINE HCL 200 MG PO TABS: 200.0000 mg | ORAL_TABLET | Freq: Three times a day (TID) | ORAL | Status: DC | PRN

## 2013-01-02 MED ORDER — HYDROCODONE-ACETAMINOPHEN 10-325 MG PO TABS: ORAL_TABLET | ORAL | Status: DC

## 2013-01-02 MED ORDER — FLUCONAZOLE 150 MG PO TABS: 150.0000 mg | ORAL_TABLET | Freq: Once | ORAL | Status: DC

## 2013-01-02 NOTE — Patient Instructions (Signed)
Get plenty of rest and drink at least 64 ounces of water daily. 

## 2013-01-02 NOTE — Progress Notes (Signed)
  Subjective:    Patient ID: Barbara Rogers, female    DOB: 1980-11-28, 32 y.o.   MRN: [id number]  HPI This 32 y.o. female presents for evaluation of increased urinary frequency, dysuria.  No hematuria.  A little increase in energy, "disposition" seems a little better, since starting Synthoid.  Managing stress better. Sexually monogamous.  No vaginal discharge.  Past medical history, surgical history, family history, social history and problem list reviewed. We reviewed the status of her FMLA request.  She has been granted a provisional approval, given the previous miscommunication, and is hopeful that it will go through.  If it is denied, she will likely lose her job.  Review of Systems     Objective:   Physical Exam Blood pressure 106/72, pulse 80, temperature 98.3 F (36.8 C), temperature source Oral, resp. rate 18, height 5\' 8"  (1.727 m), weight 172 lb (78.019 kg), last menstrual period 12/20/2012, SpO2 100.00%. Body mass index is 26.16 kg/(m^2). Well-developed, well nourished WF who is awake, alert and oriented, in NAD. HEENT: [place]/AT, PERRL, EOMI.  Sclera and conjunctiva are clear.  EAC are patent, TMs are normal in appearance. Nasal mucosa is pink and moist. OP is clear. Neck: supple, non-tender, no lymphadenopathy, thyromegaly. Heart: RRR, no murmur Lungs: normal effort, CTA Abdomen: normo-active bowel sounds, supple, non-tender, no mass or organomegaly. Extremities: no cyanosis, clubbing or edema. Skin: warm and dry without rash. Psychologic: good mood and appropriate affect, normal speech and behavior.   Results for orders placed in visit on 01/02/13  POCT UA - MICROSCOPIC ONLY      Result Value Range   WBC, Ur, HPF, POC 25-30     RBC, urine, microscopic 2-3     Bacteria, U Microscopic small     Mucus, UA neg     Epithelial cells, urine per micros 1-3     Crystals, Ur, HPF, POC neg     Casts, Ur, LPF, POC neg     Yeast, UA neg    POCT URINALYSIS DIPSTICK      Result  Value Range   Color, UA yellow     Clarity, UA hazy     Glucose, UA neg     Bilirubin, UA neg     Ketones, UA neg     Spec Grav, UA 1.025     Blood, UA trace     pH, UA 6.0     Protein, UA neg     Urobilinogen, UA 0.2     Nitrite, UA neg     Leukocytes, UA moderate (2+)         Assessment & Plan:  Dysuria - Plan: POCT UA - Microscopic Only, POCT urinalysis dipstick, Urine culture, ciprofloxacin (CIPRO) 500 MG tablet, phenazopyridine (PYRIDIUM) 200 MG tablet, fluconazole (DIFLUCAN) 150 MG tablet, phenazopyridine (PYRIDIUM) tablet 200 mg  Urinary frequency  Dysmenorrhea - Plan: HYDROcodone-acetaminophen (NORCO) 10-325 MG per tablet (to fill 01/07/2013)  Fernande Bras, PA-C Physician Assistant-Certified [facility]

## 2013-01-04 ENCOUNTER — Telehealth: Payer: Self-pay | Admitting: Physician Assistant

## 2013-01-04 LAB — URINE CULTURE

## 2013-01-04 NOTE — Telephone Encounter (Signed)
Call was to ask if I had signed a form indicating my recommendation that they patient needs up to 3-4 days/month out of work due to severe menstrual symptoms.  I indicated that I had done so.

## 2013-01-06 ENCOUNTER — Encounter (HOSPITAL_COMMUNITY): Payer: Self-pay | Admitting: *Deleted

## 2013-01-06 ENCOUNTER — Emergency Department (HOSPITAL_COMMUNITY)
Admission: EM | Admit: 2013-01-06 | Discharge: 2013-01-06 | Disposition: A | Discharge status: Home / Self Care | Payer: BC Managed Care – PPO | Attending: Emergency Medicine | Admitting: Emergency Medicine

## 2013-01-06 ENCOUNTER — Emergency Department (HOSPITAL_COMMUNITY): Payer: BC Managed Care – PPO

## 2013-01-06 ENCOUNTER — Encounter: Payer: Self-pay | Admitting: Physician Assistant

## 2013-01-06 DIAGNOSIS — Z8781 Personal history of (healed) traumatic fracture: Secondary | ICD-10-CM | POA: Insufficient documentation

## 2013-01-06 DIAGNOSIS — IMO0002 Reserved for concepts with insufficient information to code with codable children: Secondary | ICD-10-CM | POA: Insufficient documentation

## 2013-01-06 DIAGNOSIS — M25519 Pain in unspecified shoulder: Secondary | ICD-10-CM | POA: Insufficient documentation

## 2013-01-06 DIAGNOSIS — F3289 Other specified depressive episodes: Secondary | ICD-10-CM | POA: Insufficient documentation

## 2013-01-06 DIAGNOSIS — N12 Tubulo-interstitial nephritis, not specified as acute or chronic: Secondary | ICD-10-CM | POA: Insufficient documentation

## 2013-01-06 DIAGNOSIS — Z8742 Personal history of other diseases of the female genital tract: Secondary | ICD-10-CM | POA: Insufficient documentation

## 2013-01-06 DIAGNOSIS — Z3202 Encounter for pregnancy test, result negative: Secondary | ICD-10-CM | POA: Insufficient documentation

## 2013-01-06 DIAGNOSIS — F411 Generalized anxiety disorder: Secondary | ICD-10-CM | POA: Insufficient documentation

## 2013-01-06 DIAGNOSIS — F329 Major depressive disorder, single episode, unspecified: Secondary | ICD-10-CM | POA: Insufficient documentation

## 2013-01-06 DIAGNOSIS — Z87440 Personal history of urinary (tract) infections: Secondary | ICD-10-CM | POA: Insufficient documentation

## 2013-01-06 DIAGNOSIS — Z87442 Personal history of urinary calculi: Secondary | ICD-10-CM | POA: Insufficient documentation

## 2013-01-06 DIAGNOSIS — R110 Nausea: Secondary | ICD-10-CM | POA: Insufficient documentation

## 2013-01-06 DIAGNOSIS — Z880 Allergy status to penicillin: Secondary | ICD-10-CM | POA: Insufficient documentation

## 2013-01-06 DIAGNOSIS — M549 Dorsalgia, unspecified: Secondary | ICD-10-CM | POA: Insufficient documentation

## 2013-01-06 DIAGNOSIS — Z79899 Other long term (current) drug therapy: Secondary | ICD-10-CM | POA: Insufficient documentation

## 2013-01-06 DIAGNOSIS — G4700 Insomnia, unspecified: Secondary | ICD-10-CM | POA: Insufficient documentation

## 2013-01-06 DIAGNOSIS — G8929 Other chronic pain: Secondary | ICD-10-CM | POA: Insufficient documentation

## 2013-01-06 DIAGNOSIS — G43909 Migraine, unspecified, not intractable, without status migrainosus: Secondary | ICD-10-CM | POA: Insufficient documentation

## 2013-01-06 DIAGNOSIS — F17200 Nicotine dependence, unspecified, uncomplicated: Secondary | ICD-10-CM | POA: Insufficient documentation

## 2013-01-06 LAB — URINALYSIS, ROUTINE W REFLEX MICROSCOPIC
Hgb urine dipstick: NEGATIVE
Ketones, ur: 15 mg/dL — AB
Protein, ur: NEGATIVE mg/dL
Urobilinogen, UA: 2 mg/dL — ABNORMAL HIGH (ref 0.0–1.0)

## 2013-01-06 MED ORDER — SODIUM CHLORIDE 0.9 % IV SOLN
Freq: Once | INTRAVENOUS | Status: AC
  Administered 2013-01-06: 14:00:00 via INTRAVENOUS

## 2013-01-06 MED ORDER — ONDANSETRON HCL 4 MG/2ML IJ SOLN
4.0000 mg | Freq: Once | INTRAMUSCULAR | Status: AC
  Administered 2013-01-06: 4 mg via INTRAVENOUS
  Filled 2013-01-06: qty 2

## 2013-01-06 MED ORDER — DEXTROSE 5 % IV SOLN
1.0000 g | Freq: Once | INTRAVENOUS | Status: AC
  Administered 2013-01-06: 1 g via INTRAVENOUS
  Filled 2013-01-06: qty 10

## 2013-01-06 MED ORDER — CEPHALEXIN 500 MG PO CAPS: 500.0000 mg | ORAL_CAPSULE | Freq: Four times a day (QID) | ORAL | Status: DC

## 2013-01-06 MED ORDER — HYDROMORPHONE HCL PF 1 MG/ML IJ SOLN
1.0000 mg | Freq: Once | INTRAMUSCULAR | Status: AC
  Administered 2013-01-06: 1 mg via INTRAVENOUS
  Filled 2013-01-06: qty 1

## 2013-01-06 NOTE — ED Provider Notes (Signed)
Assumed care from [facility]. Urinary symptoms the flank pain. CT scan pending to rule out obstructing stone.  CT shows no ureteral obstruction. There is a nonobstructing stone in the right kidney. We'll change antibiotic to Keflex.  BP 100/58  Pulse 63  Temp(Src) 98.1 F (36.7 C) (Oral)  Resp 16  SpO2 98%  LMP 12/20/2012   Glynn Octave, MD 01/06/13 [phone]

## 2013-01-06 NOTE — ED Provider Notes (Signed)
History     CSN: [id number]  Arrival date & time 01/06/13  1235   First MD Initiated Contact with Patient 01/06/13 1309      Chief Complaint  Patient presents with  . Flank Pain    (Consider location/radiation/quality/duration/timing/severity/associated sxs/prior treatment) Patient is a 32 y.o. female presenting with flank pain. The history is provided by the patient. No language interpreter was used.  Flank Pain This is a new problem. The current episode started today. The problem occurs constantly. The problem has been gradually worsening. Associated symptoms include abdominal pain and nausea. Nothing aggravates the symptoms. She has tried nothing for the symptoms. The treatment provided moderate relief.  Pt complains of pain in her left back.  Pt reports she is on an antibiotic for a uti.  Pt was treated at Urgent care.  Pt reports she has had a kidney stone in the past and this feels like a stone  Past Medical History  Diagnosis Date  . Allergy   . Migraine   . Anxiety   . Depression   . Dysmenorrhea   . PMDD (premenstrual dysphoric disorder)   . Insomnia   . Nephrolithiasis   . Finger fracture, left 10/31/2012    LEFT 4th finger  . Chronic left shoulder pain 08/2012    History reviewed. No pertinent past surgical history.  Family History  Problem Relation Age of Onset  . Migraines Mother   . Mental illness Mother   . Heart disease Father   . Alcohol abuse Father   . Arthritis Maternal Grandmother   . Thyroid disease Maternal Grandmother   . Heart disease Maternal Grandfather     AMI 1996, 2014  . Anemia Maternal Grandfather     bone marrow dysfunction    History  Substance Use Topics  . Smoking status: Current Every Day Smoker -- 1.00 packs/day    Types: Cigarettes  . Smokeless tobacco: Never Used     Comment: using e-cig to try to quit  . Alcohol Use: Yes     Comment: social    OB History   Grav Para Term Preterm Abortions TAB SAB Ect Mult Living                   Review of Systems  Gastrointestinal: Positive for nausea and abdominal pain.  Genitourinary: Positive for flank pain.  All other systems reviewed and are negative.    Allergies  Penicillins and Progestins  Home Medications   Current Outpatient Rx  Name  Route  Sig  Dispense  Refill  . albuterol (PROVENTIL HFA;VENTOLIN HFA) 108 (90 BASE) MCG/ACT inhaler   Inhalation   Inhale 2 puffs into the lungs every 4 (four) hours as needed for wheezing (cough, shortness of breath or wheezing.).   1 Inhaler   1   . ALPRAZolam (XANAX) 1 MG tablet   Oral   Take 1 mg by mouth at bedtime as needed for sleep.         Marland Kitchen amphetamine-dextroamphetamine (ADDERALL XR) 20 MG 24 hr capsule   Oral   Take 20 mg by mouth every morning.         . ciprofloxacin (CIPRO) 500 MG tablet   Oral   Take 1 tablet (500 mg total) by mouth 2 (two) times daily.   10 tablet   0   . cyclobenzaprine (FLEXERIL) 10 MG tablet   Oral   Take 1 tablet (10 mg total) by mouth 3 (three) times daily as needed  for muscle spasms.   30 tablet   0   . DULoxetine (CYMBALTA) 30 MG capsule   Oral   Take 30 mg by mouth daily.         . fluconazole (DIFLUCAN) 150 MG tablet   Oral   Take 1 tablet (150 mg total) by mouth once. Repeat if needed   2 tablet   0   . fluticasone (FLONASE) 50 MCG/ACT nasal spray      Use 2 sprays each nostril twice daily for 3 days, then once daily   16 g   1   . HYDROcodone-acetaminophen (NORCO) 10-325 MG per tablet      TAKE 1 TABLET EVERY 8 HOURS AS NEEDED FOR PAIN. Must last 30 days.May fill on/after 01/07/2013.   90 tablet   0   . IUD's (PARAGARD INTRAUTERINE COPPER IU)   Intrauterine   1 Units by Intrauterine route.         Marland Kitchen levothyroxine (LEVOTHROID) 25 MCG tablet   Oral   Take 1 tablet (25 mcg total) by mouth daily before breakfast.   30 tablet   1   . lidocaine (LIDODERM) 5 %      PLACE 1 PATCH ONTO THE SKIN DAILY. REMOVE & DISCARD PATCH WITHIN 12  HOURS   10 patch   0   . Multiple Vitamins-Minerals (MULTIVITAMINS THER. W/MINERALS) TABS   Oral   Take 1 tablet by mouth daily.          . phenazopyridine (PYRIDIUM) 200 MG tablet   Oral   Take 1 tablet (200 mg total) by mouth 3 (three) times daily as needed for pain.   10 tablet   0   . zolpidem (AMBIEN) 10 MG tablet   Oral   Take 10 mg by mouth at bedtime as needed.           BP 105/68  Pulse 79  Temp(Src) 97.8 F (36.6 C) (Oral)  Resp 18  SpO2 99%  LMP 12/20/2012  Physical Exam  Vitals reviewed. Constitutional: She is oriented to person, place, and time. She appears well-developed and well-nourished.  HENT:  Head: Normocephalic.  Right Ear: External ear normal.  Left Ear: External ear normal.  Nose: Nose normal.  Eyes: Conjunctivae are normal. Pupils are equal, round, and reactive to light.  Neck: Normal range of motion. Neck supple.  Cardiovascular: Normal rate.   Pulmonary/Chest: Effort normal and breath sounds normal.  Abdominal: Soft. Bowel sounds are normal. There is no tenderness.  Musculoskeletal: Normal range of motion.  Neurological: She is alert and oriented to person, place, and time. She has normal reflexes.  Skin: Skin is warm.  Psychiatric: She has a normal mood and affect.    ED Course  Procedures (including critical care time)  Labs Reviewed  URINALYSIS, ROUTINE W REFLEX MICROSCOPIC - Abnormal; Notable for the following:    Color, Urine ORANGE (*)    APPearance CLOUDY (*)    Bilirubin Urine SMALL (*)    Ketones, ur 15 (*)    Urobilinogen, UA 2.0 (*)    Nitrite POSITIVE (*)    Leukocytes, UA MODERATE (*)    All other components within normal limits  URINE MICROSCOPIC-ADD ON - Abnormal; Notable for the following:    Bacteria, UA FEW (*)    All other components within normal limits  PREGNANCY, URINE   No results found.   No diagnosis found.    MDM  Pt given zofran and dilaudid,  ua shows  3-6 wbc's and few  bacteria        Elson Areas, PA-C 01/06/13 1450

## 2013-01-06 NOTE — ED Notes (Signed)
Pt reports having urinary symptoms, went to ucc on Saturday and has been taking cipro as prescribed. Woke up this am now having left flank pain, states this is similar to kidney stone pain in past. Still having urinary pain and urgency.

## 2013-01-06 NOTE — ED Notes (Signed)
Patient transported to CT 

## 2013-01-06 NOTE — Telephone Encounter (Signed)
To you FYI pt in ER at [facility].

## 2013-01-07 ENCOUNTER — Encounter: Payer: Self-pay | Admitting: Physician Assistant

## 2013-01-07 LAB — URINE CULTURE
Colony Count: NO GROWTH
Culture: NO GROWTH

## 2013-01-08 NOTE — ED Provider Notes (Signed)
Medical screening examination/treatment/procedure(s) were performed by non-physician practitioner and as supervising physician I was immediately available for consultation/collaboration.   Gwyneth Sprout, MD 01/08/13 1413

## 2013-02-02 ENCOUNTER — Other Ambulatory Visit: Payer: Self-pay | Admitting: Physician Assistant

## 2013-02-02 ENCOUNTER — Ambulatory Visit (INDEPENDENT_AMBULATORY_CARE_PROVIDER_SITE_OTHER): Payer: BC Managed Care – PPO | Admitting: Physician Assistant

## 2013-02-02 VITALS — BP 110/78 | HR 80 | Temp 98.3°F | Resp 16 | Ht 67.5 in | Wt 173.0 lb

## 2013-02-02 DIAGNOSIS — IMO0001 Reserved for inherently not codable concepts without codable children: Secondary | ICD-10-CM

## 2013-02-02 DIAGNOSIS — N946 Dysmenorrhea, unspecified: Secondary | ICD-10-CM

## 2013-02-02 DIAGNOSIS — R350 Frequency of micturition: Secondary | ICD-10-CM

## 2013-02-02 DIAGNOSIS — E039 Hypothyroidism, unspecified: Secondary | ICD-10-CM

## 2013-02-02 LAB — POCT URINALYSIS DIPSTICK
Blood, UA: NEGATIVE
Glucose, UA: NEGATIVE
Protein, UA: NEGATIVE
Spec Grav, UA: 1.025
Urobilinogen, UA: 0.2
pH, UA: 6

## 2013-02-02 LAB — POCT UA - MICROSCOPIC ONLY: Crystals, Ur, HPF, POC: NEGATIVE

## 2013-02-02 MED ORDER — FLUCONAZOLE 150 MG PO TABS: 150.0000 mg | ORAL_TABLET | Freq: Once | ORAL | Status: DC

## 2013-02-02 MED ORDER — PHENAZOPYRIDINE HCL 200 MG PO TABS: 200.0000 mg | ORAL_TABLET | Freq: Three times a day (TID) | ORAL | Status: DC | PRN

## 2013-02-02 MED ORDER — HYDROCODONE-ACETAMINOPHEN 10-325 MG PO TABS: ORAL_TABLET | ORAL | Status: DC

## 2013-02-02 NOTE — Patient Instructions (Signed)
Get plenty of rest and drink at least 64 ounces of water daily. 

## 2013-02-02 NOTE — Progress Notes (Signed)
  Subjective:    Patient ID: KORAL THADEN, female    DOB: 09/03/1981, 32 y.o.   MRN: [id number]  HPI This 32 y.o. female presents for evaluation of urinary urgency and frequency. Recently treated with Cipro (01/02/2013, here for UTI) and Keflex (01/06/2013, in the ED for possible pyelonephritis) and followed treatment with diflucan x 2 doses.  Symptoms resolved x 1-2 weeks, then recurred with back pain, then urinary frequency and urgency restarted about 3-4 days ago.  No dysuria.  Mild blood, but unclear if it's vaginal bleeding or from the urine. No atypical vaginal discharge.  Additionally, she's recently started levothyroxine for hypothyroidism, and is due to have an updated TSH. Her dysmenorrhea is baseline, and she's waiting to find out about the appeal for FMLA.  Past medical history, surgical history, family history, social history and problem list reviewed.   Review of Systems As above.    Objective:   Physical Exam Blood pressure 110/78, pulse 80, temperature 98.3 F (36.8 C), temperature source Oral, resp. rate 16, height 5' 7.5" (1.715 m), weight 173 lb (78.472 kg), last menstrual period 01/16/2013, SpO2 99.00%. Body mass index is 26.68 kg/(m^2). Well-developed, well nourished WF who is awake, alert and oriented, in NAD. HEENT: /AT, sclera and conjunctiva are clear.   Neck: supple, non-tender, no lymphadenopathy, thyromegaly. Heart: RRR, no murmur Lungs: normal effort, CTA Abdomen: normo-active bowel sounds, supple, non-tender, no mass or organomegaly. No CVA tenderness. Extremities: no cyanosis, clubbing or edema. Skin: warm and dry without rash. Psychologic: good mood and appropriate affect, normal speech and behavior.    Results for orders placed in visit on 02/02/13  POCT URINALYSIS DIPSTICK      Result Value Range   Color, UA amber     Clarity, UA clear     Glucose, UA neg     Bilirubin, UA neg     Ketones, UA trace     Spec Grav, UA 1.025     Blood, UA neg      pH, UA 6.0     Protein, UA neg     Urobilinogen, UA 0.2     Nitrite, UA neg     Leukocytes, UA Trace    POCT UA - MICROSCOPIC ONLY      Result Value Range   WBC, Ur, HPF, POC 12-31     RBC, urine, microscopic 1-6     Bacteria, U Microscopic 2+     Mucus, UA large     Epithelial cells, urine per micros 1-8     Crystals, Ur, HPF, POC neg     Casts, Ur, LPF, POC neg     Yeast, UA pos         Assessment & Plan:  Frequency - Plan: POCT urinalysis dipstick, POCT UA - Microscopic Only, Urine culture, fluconazole (DIFLUCAN) 150 MG tablet, phenazopyridine (PYRIDIUM) 200 MG tablet; hold off on antibiotic until culture results are available, as her symptoms may be due to yeast vaginosis.  However, if her symptoms escalate in the interim, she'll contact me.  Hypothyroid - Plan: TSH  Dysmenorrhea - Plan: HYDROcodone-acetaminophen (NORCO) 10-325 MG per tablet (to fill 02/05/2013)  Fernande Bras, PA-C Physician Assistant-Certified [facility]

## 2013-02-03 ENCOUNTER — Encounter: Payer: Self-pay | Admitting: Physician Assistant

## 2013-02-03 LAB — TSH: TSH: 0.378 u[IU]/mL (ref 0.350–4.500)

## 2013-02-04 MED ORDER — SULFAMETHOXAZOLE-TRIMETHOPRIM 800-160 MG PO TABS: 1.0000 | ORAL_TABLET | Freq: Two times a day (BID) | ORAL | Status: DC

## 2013-02-04 NOTE — Addendum Note (Signed)
Addended by: Fernande Bras on: 02/04/2013 01:17 PM   Modules accepted: Orders

## 2013-02-07 ENCOUNTER — Encounter: Payer: Self-pay | Admitting: Physician Assistant

## 2013-02-07 ENCOUNTER — Other Ambulatory Visit: Payer: Self-pay | Admitting: Physician Assistant

## 2013-02-08 ENCOUNTER — Telehealth: Payer: Self-pay

## 2013-02-08 ENCOUNTER — Other Ambulatory Visit: Payer: Self-pay | Admitting: Physician Assistant

## 2013-02-08 DIAGNOSIS — IMO0001 Reserved for inherently not codable concepts without codable children: Secondary | ICD-10-CM

## 2013-02-08 NOTE — Telephone Encounter (Signed)
Pt would like her levothyroxine called in for a qty of 120 because she can get the medication for free if its written that way. Best# [phone]

## 2013-02-09 MED ORDER — FLUCONAZOLE 150 MG PO TABS: 150.0000 mg | ORAL_TABLET | Freq: Once | ORAL | Status: DC

## 2013-02-09 MED ORDER — LEVOTHYROXINE SODIUM 25 MCG PO TABS: ORAL_TABLET | ORAL | Status: DC

## 2013-02-09 NOTE — Telephone Encounter (Signed)
Sent in

## 2013-02-09 NOTE — Telephone Encounter (Signed)
Can we give pt RF of diflucan, or does she need to come back for re-eval?

## 2013-02-15 ENCOUNTER — Other Ambulatory Visit: Payer: Self-pay | Admitting: Physician Assistant

## 2013-02-15 MED ORDER — LEVOTHYROXINE SODIUM 25 MCG PO TABS: ORAL_TABLET | ORAL | Status: DC

## 2013-03-02 ENCOUNTER — Telehealth: Payer: Self-pay

## 2013-03-02 NOTE — Telephone Encounter (Signed)
Patient wants [name] to give her a work excuse note for today 03/02/13. Patient says she was in pain yesterday and now its affecting her more today. Patient wants [name] or her assistant to call back if this is possible or not. Please advise.   Best #: [phone]

## 2013-03-02 NOTE — Telephone Encounter (Signed)
Spoke to patient , states pain is "same as previous", it is behind her left shoulder , has lidocaine patch wants to rest this area please advise.

## 2013-03-02 NOTE — Telephone Encounter (Signed)
Please get the specifics regarding the pain she was having yesterday and today.

## 2013-03-02 NOTE — Telephone Encounter (Signed)
Ok for note for yesterday and today.

## 2013-03-03 ENCOUNTER — Encounter: Payer: Self-pay | Admitting: Physician Assistant

## 2013-03-03 NOTE — Telephone Encounter (Signed)
Note at front desk for pick up patient advised.

## 2013-03-04 ENCOUNTER — Telehealth: Payer: Self-pay

## 2013-03-04 NOTE — Telephone Encounter (Signed)
Faxed

## 2013-03-04 NOTE — Telephone Encounter (Signed)
Pt is wanting to know if his out of work note that is in the pick up drawer could be faxed to his work at [phone]  Best number for patient [phone]

## 2013-03-07 ENCOUNTER — Other Ambulatory Visit: Payer: Self-pay | Admitting: Physician Assistant

## 2013-03-08 ENCOUNTER — Other Ambulatory Visit: Payer: Self-pay | Admitting: Physician Assistant

## 2013-03-08 DIAGNOSIS — N946 Dysmenorrhea, unspecified: Secondary | ICD-10-CM

## 2013-03-09 ENCOUNTER — Other Ambulatory Visit: Payer: Self-pay

## 2013-03-09 ENCOUNTER — Telehealth: Payer: Self-pay | Admitting: Radiology

## 2013-03-09 MED ORDER — HYDROCODONE-ACETAMINOPHEN 10-325 MG PO TABS: ORAL_TABLET | ORAL | Status: DC

## 2013-03-09 NOTE — Telephone Encounter (Signed)
Patient requested all her work notes for this year these are printed for her and left at front desk for pick up. She indicates she is written up at work for time missed in March, I advised her I do not have record of March visit or work note, she indicates it may have been the orthopedist who wrote her out in march.

## 2013-03-11 NOTE — Telephone Encounter (Signed)
Maybe [name]

## 2013-03-16 ENCOUNTER — Encounter: Payer: Self-pay | Admitting: Physician Assistant

## 2013-03-18 ENCOUNTER — Ambulatory Visit (INDEPENDENT_AMBULATORY_CARE_PROVIDER_SITE_OTHER): Payer: BC Managed Care – PPO | Admitting: Physician Assistant

## 2013-03-18 VITALS — BP 110/68 | HR 80 | Temp 98.0°F | Resp 16 | Ht 68.0 in | Wt 170.0 lb

## 2013-03-18 DIAGNOSIS — M25519 Pain in unspecified shoulder: Secondary | ICD-10-CM

## 2013-03-18 DIAGNOSIS — G8929 Other chronic pain: Secondary | ICD-10-CM

## 2013-03-18 DIAGNOSIS — M25512 Pain in left shoulder: Secondary | ICD-10-CM

## 2013-03-18 DIAGNOSIS — N946 Dysmenorrhea, unspecified: Secondary | ICD-10-CM

## 2013-03-18 DIAGNOSIS — N943 Premenstrual tension syndrome: Secondary | ICD-10-CM

## 2013-03-18 DIAGNOSIS — F3281 Premenstrual dysphoric disorder: Secondary | ICD-10-CM

## 2013-03-18 MED ORDER — LIDOCAINE 5 % EX PTCH: 1.0000 | MEDICATED_PATCH | Freq: Every day | CUTANEOUS | Status: DC | PRN

## 2013-03-18 NOTE — Patient Instructions (Signed)
Go to physical therapy for your shoulder.

## 2013-03-18 NOTE — Progress Notes (Signed)
  Subjective:    Patient ID: Barbara Rogers, female    DOB: [birth year], 32 y.o.   MRN: [id number]  HPI  This 32 y.o. female presents for evaluation of dysmenorrhea, PMDD and chronic LEFT shoulder pain. She needs a refill of lidocaine patches and forms completed regarding increased use of FMLA during June.  Usually, she misses 2 days/month, but in June she missed 7. She had a change from Cymbalta to Pristiq with Dr. Evelene Croon, and had increased depressive symptoms with her last cycle.  She also notes that twice a year she has a "really bad one" with increased mood changes, increased cramping.  Typically, her symptoms begin 2 days prior to the onset of bleeding, and last 1-3 days after, but can last as long as a week.  Her next visit with Dr. Evelene Croon is next week.  She doesn't have the bowel changes that the Cymbalta caused, but she doesn't like the Pristiq any better.  She hasn't gone to PT, as advised by Dr. Charlett Blake for scar tissue in the LEFT shoulder due to repetitive injury to the area because of finances.  She needs to know how much it will cost and expects to have to "save up."  Past medical history, surgical history, family history, social history and problem list reviewed.   Review of Systems As above.    Objective:   Physical Exam Blood pressure 110/68, pulse 80, temperature 98 F (36.7 C), temperature source Oral, resp. rate 16, height 5\' 8"  (1.727 m), weight 170 lb (77.111 kg), last menstrual period 03/07/2013, SpO2 99.00%. Body mass index is 25.85 kg/(m^2). Well-developed, well nourished WF who is awake, alert and oriented, in NAD. HEENT: [place]/AT, sclera and conjunctiva are clear.   Neck: supple, non-tender, no lymphadenopathy, thyromegaly. Heart: RRR, no murmur Lungs: normal effort, CTA Extremities: no cyanosis, clubbing or edema. Tenderness in the LEFT shoulder region is mild but diffuse. Skin: warm and dry without rash. Psychologic: good mood and appropriate affect, normal speech and  behavior.        Assessment & Plan:  Dysmenorrhea  PMDD (premenstrual dysphoric disorder)  Chronic left shoulder pain - Plan: lidocaine (LIDODERM) 5 %  Continue current treatment.  Paperwork completed to explain increased use of FMLA last month.  Proceed with PT and appointment with Dr. Evelene Croon.  Fernande Bras, PA-C Physician Assistant-Certified [facility]

## 2013-04-06 ENCOUNTER — Other Ambulatory Visit: Payer: Self-pay | Admitting: Physician Assistant

## 2013-04-06 DIAGNOSIS — N946 Dysmenorrhea, unspecified: Secondary | ICD-10-CM

## 2013-04-06 MED ORDER — HYDROCODONE-ACETAMINOPHEN 10-325 MG PO TABS: ORAL_TABLET | ORAL | Status: DC

## 2013-04-07 ENCOUNTER — Encounter: Payer: Self-pay | Admitting: Physician Assistant

## 2013-04-07 ENCOUNTER — Other Ambulatory Visit: Payer: Self-pay | Admitting: Physician Assistant

## 2013-04-07 NOTE — Telephone Encounter (Signed)
Med faxed   

## 2013-04-07 NOTE — Telephone Encounter (Signed)
Patient has a question about a medicine she tried to refill through [facility], [phone].

## 2013-04-20 ENCOUNTER — Encounter: Payer: Self-pay | Admitting: Physician Assistant

## 2013-04-26 ENCOUNTER — Encounter: Payer: Self-pay | Admitting: Physician Assistant

## 2013-05-06 ENCOUNTER — Telehealth: Payer: Self-pay | Admitting: Physician Assistant

## 2013-05-06 DIAGNOSIS — N946 Dysmenorrhea, unspecified: Secondary | ICD-10-CM

## 2013-05-06 MED ORDER — HYDROCODONE-ACETAMINOPHEN 10-325 MG PO TABS: ORAL_TABLET | ORAL | Status: DC

## 2013-05-06 NOTE — Telephone Encounter (Signed)
Rx printed.  Meds ordered this encounter  Medications  . HYDROcodone-acetaminophen (NORCO) 10-325 MG per tablet    Sig: TAKE 1 TABLET EVERY 8 HOURS AS NEEDED FOR PAIN.    Dispense:  90 tablet    Refill:  0    Order Specific Question:  Supervising Provider    Answer:  DOOLITTLE, ROBERT P [[id number]]

## 2013-05-07 ENCOUNTER — Other Ambulatory Visit: Payer: Self-pay | Admitting: Physician Assistant

## 2013-05-07 NOTE — Telephone Encounter (Signed)
Faxed RX to pharmacy.  

## 2013-05-16 ENCOUNTER — Encounter: Payer: Self-pay | Admitting: Physician Assistant

## 2013-05-17 ENCOUNTER — Other Ambulatory Visit: Payer: Self-pay | Admitting: Physician Assistant

## 2013-05-17 DIAGNOSIS — G8929 Other chronic pain: Secondary | ICD-10-CM

## 2013-05-17 MED ORDER — LIDOCAINE 5 % EX PTCH: 2.0000 | MEDICATED_PATCH | Freq: Every day | CUTANEOUS | Status: DC | PRN

## 2013-06-02 ENCOUNTER — Ambulatory Visit (INDEPENDENT_AMBULATORY_CARE_PROVIDER_SITE_OTHER): Payer: BC Managed Care – PPO | Admitting: Women's Health

## 2013-06-02 ENCOUNTER — Encounter: Payer: Self-pay | Admitting: Women's Health

## 2013-06-02 VITALS — BP 126/78 | Ht 67.0 in | Wt 169.7 lb

## 2013-06-02 DIAGNOSIS — Z975 Presence of (intrauterine) contraceptive device: Secondary | ICD-10-CM

## 2013-06-02 DIAGNOSIS — N946 Dysmenorrhea, unspecified: Secondary | ICD-10-CM

## 2013-06-02 DIAGNOSIS — Z01419 Encounter for gynecological examination (general) (routine) without abnormal findings: Secondary | ICD-10-CM

## 2013-06-02 MED ORDER — TRAMADOL HCL 50 MG PO TABS: 50.0000 mg | ORAL_TABLET | Freq: Four times a day (QID) | ORAL | Status: DC | PRN

## 2013-06-02 NOTE — Patient Instructions (Addendum)

## 2013-06-02 NOTE — Progress Notes (Signed)
Barbara Rogers [birth year] [id number]    History:    The patient presents for annual exam.  ParaGard IUD placed 11/2010 with continued dysmenorrhea. Questions if she has endometriosis. Did not tolerate Mirena IUD or oral contraceptives, reports problems with hormones. States PMDD symptoms have decreased since starting Synthroid several months ago. Currently on Synthroid 25 per primary care. Long-term history'of anxiety/depression/PMDD/insomnia continues to see a psychiatrist and is currently on no antidepressant. Mother history of suicide. Smoker. 2009 LGSIL on Pap with negative C&B, normal after.  Past medical history, past surgical history, family history and social history were all reviewed and documented in the EPIC chart. Works at [employer] has a left shoulder pain problem that she has been prescribed narcotics.  Lives with her long-term boyfriend.   ROS:  A  ROS was performed and pertinent positives and negatives are included in the history.  Exam:  Filed Vitals:   06/02/13 0832  BP: 126/78    General appearance:  Normal Head/Neck:  Normal, without cervical or supraclavicular adenopathy. Thyroid:  Symmetrical, normal in size, without palpable masses or nodularity. Respiratory  Effort:  Normal  Auscultation:  Clear without wheezing or rhonchi Cardiovascular  Auscultation:  Regular rate, without rubs, murmurs or gallops  Edema/varicosities:  Not grossly evident Abdominal  Soft,nontender, without masses, guarding or rebound.  Liver/spleen:  No organomegaly noted  Hernia:  None appreciated  Skin  Inspection:  Grossly normal  Palpation:  Grossly normal Neurologic/psychiatric  Orientation:  Normal with appropriate conversation.  Mood/affect:  Normal  Genitourinary    Breasts: Examined lying and sitting.     Right: Without masses, retractions, discharge or axillary adenopathy.     Left: Without masses, retractions, discharge or axillary adenopathy.   Inguinal/mons:  Normal without  inguinal adenopathy  External genitalia:  Normal  BUS/Urethra/Skene's glands:  Normal  Bladder:  Normal  Vagina:  Normal  Cervix:  Normal IUD strings visible  Uterus:   normal in size, shape and contour.  Midline and mobile  Adnexa/parametria:     Rt: Without masses or tenderness.   Lt: Without masses or tenderness.  Anus and perineum: Normal  Digital rectal exam: Normal sphincter tone without palpated masses or tenderness  Assessment/Plan:  32 y.o. SWF G3P0 for annual exam with complaints of dysmenorrhea.  ParaGard IUD placed 11/2010 LGSIL with negative C&B 2009 Anxiety/depression/PMDD/insomnia-psychiatrist manages Hypothyroid-primary care manages labs and meds Smoker  Plan: Options for dysmenorrhea reviewed,  no relief with Motrin,  Ultram prescription, proper use given and reviewed addictive properties to use sparingly and not along with narcotic.  Continue care with psychiatrist for mood issues. SBE's, increase regular exercise, calcium rich diet, MVI daily encouraged. Aware of hazards of smoking, is in the process of trying to quit. Pap, Pap normal 2012,.    Harrington Challenger [facility], 1:31 PM 06/02/2013

## 2013-06-05 ENCOUNTER — Other Ambulatory Visit: Payer: Self-pay | Admitting: Physician Assistant

## 2013-06-05 DIAGNOSIS — N946 Dysmenorrhea, unspecified: Secondary | ICD-10-CM

## 2013-06-06 ENCOUNTER — Encounter: Payer: Self-pay | Admitting: Physician Assistant

## 2013-06-06 ENCOUNTER — Other Ambulatory Visit: Payer: Self-pay | Admitting: Physician Assistant

## 2013-06-07 ENCOUNTER — Encounter: Payer: Self-pay | Admitting: Physician Assistant

## 2013-06-07 NOTE — Telephone Encounter (Signed)
Reply sent

## 2013-06-07 NOTE — Telephone Encounter (Signed)
Coralie,  The request came in on Saturday, and I wasn't here this weekend.  I've printed it.  Do you want it called/faxed in, or do you want to pick it up? Let me know.    FYI, effective 06/14/2013, this medication moves to the [facility] Schedule 2, which will mean that you will have to pick up the actual prescription (we won't be able to call/fax it in).  Warmly, Trelyn Vanderlinde

## 2013-06-10 ENCOUNTER — Encounter: Payer: Self-pay | Admitting: Physician Assistant

## 2013-06-10 ENCOUNTER — Ambulatory Visit (INDEPENDENT_AMBULATORY_CARE_PROVIDER_SITE_OTHER): Payer: BC Managed Care – PPO | Admitting: Physician Assistant

## 2013-06-10 VITALS — BP 110/64 | HR 81 | Temp 98.4°F | Resp 16 | Ht 68.0 in | Wt 169.0 lb

## 2013-06-10 DIAGNOSIS — R300 Dysuria: Secondary | ICD-10-CM

## 2013-06-10 DIAGNOSIS — N390 Urinary tract infection, site not specified: Secondary | ICD-10-CM

## 2013-06-10 LAB — POCT UA - MICROSCOPIC ONLY
Casts, Ur, LPF, POC: NEGATIVE
Crystals, Ur, HPF, POC: NEGATIVE
Mucus, UA: NEGATIVE
Yeast, UA: NEGATIVE

## 2013-06-10 LAB — POCT URINALYSIS DIPSTICK
Bilirubin, UA: NEGATIVE
Blood, UA: NEGATIVE
Ketones, UA: NEGATIVE
Spec Grav, UA: 1.025
pH, UA: 5

## 2013-06-10 MED ORDER — NITROFURANTOIN MONOHYD MACRO 100 MG PO CAPS: 100.0000 mg | ORAL_CAPSULE | Freq: Two times a day (BID) | ORAL | Status: DC

## 2013-06-10 MED ORDER — PHENAZOPYRIDINE HCL 200 MG PO TABS: 200.0000 mg | ORAL_TABLET | Freq: Three times a day (TID) | ORAL | Status: DC | PRN

## 2013-06-10 NOTE — Progress Notes (Signed)
Subjective:    Patient ID: Barbara Rogers, female    DOB: 05-Mar-1981, 32 y.o.   MRN: [id number]  HPI   Barbara Rogers is a 32 yr old female here with concern for UTI.  Began having symptoms yesterday. +freq, +urg, +dysuria.  Reports that she has frequent UTIs, last was in May 2014.  Denies abd pain, flank pain, NV, FC.  Denies vaginal symptoms - just had GYN exam this week, all normal.  No hematuria.  Has used pyridium for symptoms.   Review of Systems  Constitutional: Negative for fever and chills.  HENT: Negative.   Respiratory: Negative.   Cardiovascular: Negative.   Gastrointestinal: Negative for nausea, vomiting and abdominal pain.  Genitourinary: Positive for dysuria, urgency and frequency. Negative for hematuria, flank pain and vaginal discharge.  Musculoskeletal: Negative.   Skin: Negative.   Neurological: Negative.        Objective:   Physical Exam  Vitals reviewed. Constitutional: She is oriented to person, place, and time. She appears well-developed and well-nourished. No distress.  HENT:  Head: Normocephalic and atraumatic.  Eyes: Conjunctivae are normal. No scleral icterus.  Cardiovascular: Normal rate, regular rhythm and normal heart sounds.   Pulmonary/Chest: Effort normal and breath sounds normal. She has no wheezes. She has no rales.  Abdominal: Soft. There is no tenderness.  Neurological: She is alert and oriented to person, place, and time.  Skin: Skin is warm and dry.  Psychiatric: She has a normal mood and affect. Her behavior is normal.    Results for orders placed in visit on 06/10/13  POCT UA - MICROSCOPIC ONLY      Result Value Range   WBC, Ur, HPF, POC TNTC     RBC, urine, microscopic TNTC     Bacteria, U Microscopic 1+     Mucus, UA Neg     Epithelial cells, urine per micros 6-15     Crystals, Ur, HPF, POC Neg     Casts, Ur, LPF, POC Neg     Yeast, UA Neg     Clue Cell Exam 2-3    POCT URINALYSIS DIPSTICK      Result Value Range   Color, UA  orange     Clarity, UA clear     Glucose, UA 100     Bilirubin, UA neg     Ketones, UA neg     Spec Grav, UA 1.025     Blood, UA neg     pH, UA 5.0     Protein, UA Trace     Urobilinogen, UA 1.0     Nitrite, UA Pos     Leukocytes, UA Trace         Assessment & Plan:  UTI (urinary tract infection) - Plan: Urine culture, nitrofurantoin, macrocrystal-monohydrate, (MACROBID) 100 MG capsule, phenazopyridine (PYRIDIUM) 200 MG tablet  Dysuria - Plan: POCT UA - Microscopic Only, POCT urinalysis dipstick  Barbara Rogers is a 32 yr old female with UTI.  Will treat with macrobid x 5 days.  Cx sent - will adjust therapy if necessary.  Pyridium prn symptoms - pt requests a "90 day supply" of this for cost savings.  I have sent #18 to her pharmacy for use with future UTIs, cautioned her that it is only to be used for 2 days at a time.  She expresses understanding.  Discussed strategies for hopefully reducing UTIs in the future including hydration and not holding her urine.  Work note provided.  Pt to call  or RTC if worsening or not improving.

## 2013-06-10 NOTE — Patient Instructions (Addendum)
Begin using the Macrobid (nitrofurantoin) as directed.  Be sure to finish the full course.  Pyridium if needed.  Drink plenty of fluids (water is best!)  Please let us know if you are worsening or not improving.   Urinary Tract Infection Urinary tract infections (UTIs) can develop anywhere along your urinary tract. Your urinary tract is your body's drainage system for removing wastes and extra water. Your urinary tract includes two kidneys, two ureters, a bladder, and a urethra. Your kidneys are a pair of bean-shaped organs. Each kidney is about the size of your fist. They are located below your ribs, one on each side of your spine. CAUSES Infections are caused by microbes, which are microscopic organisms, including fungi, viruses, and bacteria. These organisms are so small that they can only be seen through a microscope. Bacteria are the microbes that most commonly cause UTIs. SYMPTOMS  Symptoms of UTIs may vary by age and gender of the patient and by the location of the infection. Symptoms in young women typically include a frequent and intense urge to urinate and a painful, burning feeling in the bladder or urethra during urination. Older women and men are more likely to be tired, shaky, and weak and have muscle aches and abdominal pain. A fever may mean the infection is in your kidneys. Other symptoms of a kidney infection include pain in your back or sides below the ribs, nausea, and vomiting. DIAGNOSIS To diagnose a UTI, your caregiver will ask you about your symptoms. Your caregiver also will ask to provide a urine sample. The urine sample will be tested for bacteria and white blood cells. White blood cells are made by your body to help fight infection. TREATMENT  Typically, UTIs can be treated with medication. Because most UTIs are caused by a bacterial infection, they usually can be treated with the use of antibiotics. The choice of antibiotic and length of treatment depend on your symptoms and  the type of bacteria causing your infection. HOME CARE INSTRUCTIONS  If you were prescribed antibiotics, take them exactly as your caregiver instructs you. Finish the medication even if you feel better after you have only taken some of the medication.  Drink enough water and fluids to keep your urine clear or pale yellow.  Avoid caffeine, tea, and carbonated beverages. They tend to irritate your bladder.  Empty your bladder often. Avoid holding urine for long periods of time.  Empty your bladder before and after sexual intercourse.  After a bowel movement, women should cleanse from front to back. Use each tissue only once. SEEK MEDICAL CARE IF:   You have back pain.  You develop a fever.  Your symptoms do not begin to resolve within 3 days. SEEK IMMEDIATE MEDICAL CARE IF:   You have severe back pain or lower abdominal pain.  You develop chills.  You have nausea or vomiting.  You have continued burning or discomfort with urination. MAKE SURE YOU:   Understand these instructions.  Will watch your condition.  Will get help right away if you are not doing well or get worse. Document Released: 06/05/2005 Document Revised: 02/25/2012 Document Reviewed: 10/04/2011 [facility] Patient Information 2014 [place], [place].

## 2013-06-11 MED ORDER — FLUCONAZOLE 150 MG PO TABS: 150.0000 mg | ORAL_TABLET | Freq: Once | ORAL | Status: DC

## 2013-06-11 NOTE — Telephone Encounter (Signed)
Pt requested fluconazole with abx at visit yesterday, and I forgot to send them in.  Have sent medication today.

## 2013-06-11 NOTE — Telephone Encounter (Signed)
Pt requests additional day out of work.  I have updated her oow note.  Printed at front desk to pick up.  Also viewable in MyChart

## 2013-06-12 ENCOUNTER — Encounter: Payer: Self-pay | Admitting: Physician Assistant

## 2013-06-12 LAB — URINE CULTURE

## 2013-06-14 ENCOUNTER — Encounter: Payer: Self-pay | Admitting: Physician Assistant

## 2013-06-14 MED ORDER — SULFAMETHOXAZOLE-TRIMETHOPRIM 800-160 MG PO TABS: 1.0000 | ORAL_TABLET | Freq: Two times a day (BID) | ORAL | Status: DC

## 2013-06-14 NOTE — Telephone Encounter (Signed)
Sorry you are not feeling better!  I just got your urine culture results and sent you a message about it.  The bacteria has intermediate sensitivity to the macrobid and was resistant to a few other things.  It is sensitive to bactrim and I have sent that to your pharmacy.  I have also updated your note for work, should be viewable in [place].

## 2013-06-17 ENCOUNTER — Telehealth: Payer: Self-pay | Admitting: *Deleted

## 2013-06-17 NOTE — Telephone Encounter (Signed)
Message left

## 2013-06-17 NOTE — Telephone Encounter (Signed)
Pt called requesting pap results from [facility] 06/02/13. I told pt I do not see where pap was done on this day. Last pap normal in 2012 per note. Pt states that was her purpose for appointment. I explained to patient that there are new guidelines and pap smears are not every due depending on age and pap smear history. Pt continue to asked my "why she did not received pap" she said you told her something at that visit was going to be sent off.  Please advise

## 2013-06-17 NOTE — Telephone Encounter (Signed)
Telephone call, reviewed Pap was done, lost, return to office, no charge will repeat Pap. Apologies given. Transfer to appointments.

## 2013-06-18 ENCOUNTER — Encounter: Payer: Self-pay | Admitting: Physician Assistant

## 2013-06-22 ENCOUNTER — Ambulatory Visit (INDEPENDENT_AMBULATORY_CARE_PROVIDER_SITE_OTHER): Payer: BC Managed Care – PPO | Admitting: Women's Health

## 2013-06-22 ENCOUNTER — Other Ambulatory Visit (HOSPITAL_COMMUNITY)
Admission: RE | Admit: 2013-06-22 | Discharge: 2013-06-22 | Disposition: A | Discharge status: Home / Self Care | Payer: BC Managed Care – PPO | Source: Ambulatory Visit | Attending: Gynecology | Admitting: Gynecology

## 2013-06-22 ENCOUNTER — Encounter: Payer: Self-pay | Admitting: Women's Health

## 2013-06-22 DIAGNOSIS — Z01419 Encounter for gynecological examination (general) (routine) without abnormal findings: Secondary | ICD-10-CM

## 2013-06-22 NOTE — Progress Notes (Signed)
Patient ID: Barbara Rogers, female   DOB: 10/09/1980, 32 y.o.   MRN: [id number] Presents for Pap. At annual exam Pap was taken and not processed. No complaints.   Exam: Appears well, external genitalia within normal limits, speculum exam cervix pink healthy without visible lesion, ParaGard IUD strings visible. Pap taken.   Will triage on results.

## 2013-06-25 ENCOUNTER — Ambulatory Visit: Payer: BC Managed Care – PPO | Admitting: Women's Health

## 2013-07-02 ENCOUNTER — Encounter: Payer: Self-pay | Admitting: Physician Assistant

## 2013-07-05 ENCOUNTER — Other Ambulatory Visit: Payer: Self-pay | Admitting: Physician Assistant

## 2013-07-06 MED ORDER — HYDROCODONE-ACETAMINOPHEN 10-325 MG PO TABS: ORAL_TABLET | ORAL | Status: DC

## 2013-07-06 NOTE — Telephone Encounter (Signed)
[place],  The [facility] policy for controlled substances was updated in 07/2012: [facility] Policy for Prescribing Controlled Substances (Revised 07/2012) 1. Prescriptions for controlled substances will be filled by ONE provider at [facility] with whom you have established and developed a plan for your care, including follow-up. 2. You are encouraged to schedule an appointment with your prescriber at our appointment center for follow-up visits whenever possible. 3. If you request a prescription for the controlled substance while at [facility] for an acute problem (with someone other than your regular prescriber), you MAY be given a ONE-TIME prescription for a 30-day supply of the controlled substance, to allow time for you to return to see your regular prescriber for additional prescriptions.  The new change is that the FDA moved hydrocodone from Schedule 3 (may be faxed or called in) to schedule 2 (paper prescription required).  It's requiring an adjustment on our part, too!  I've printed your prescription, and you may pick it up at your convenience.  Warmly, Calin Ellery

## 2013-07-07 ENCOUNTER — Other Ambulatory Visit: Payer: Self-pay | Admitting: Physician Assistant

## 2013-07-15 ENCOUNTER — Other Ambulatory Visit: Payer: Self-pay

## 2013-07-16 ENCOUNTER — Ambulatory Visit (INDEPENDENT_AMBULATORY_CARE_PROVIDER_SITE_OTHER): Payer: BC Managed Care – PPO | Admitting: Physician Assistant

## 2013-07-16 VITALS — BP 116/72 | HR 98 | Temp 98.0°F | Resp 20 | Ht 68.0 in | Wt 166.0 lb

## 2013-07-16 DIAGNOSIS — R300 Dysuria: Secondary | ICD-10-CM

## 2013-07-16 DIAGNOSIS — R81 Glycosuria: Secondary | ICD-10-CM

## 2013-07-16 DIAGNOSIS — R7309 Other abnormal glucose: Secondary | ICD-10-CM

## 2013-07-16 DIAGNOSIS — N390 Urinary tract infection, site not specified: Secondary | ICD-10-CM

## 2013-07-16 DIAGNOSIS — R739 Hyperglycemia, unspecified: Secondary | ICD-10-CM

## 2013-07-16 DIAGNOSIS — R350 Frequency of micturition: Secondary | ICD-10-CM

## 2013-07-16 LAB — POCT URINALYSIS DIPSTICK
Blood, UA: NEGATIVE
Nitrite, UA: POSITIVE
Spec Grav, UA: 1.02
pH, UA: 6.5

## 2013-07-16 LAB — POCT UA - MICROSCOPIC ONLY
Crystals, Ur, HPF, POC: NEGATIVE
Mucus, UA: NEGATIVE
Renal tubular cells: POSITIVE

## 2013-07-16 LAB — POCT GLYCOSYLATED HEMOGLOBIN (HGB A1C): Hemoglobin A1C: 4.8

## 2013-07-16 LAB — GLUCOSE, POCT (MANUAL RESULT ENTRY): POC Glucose: 109 mg/dl — AB (ref 70–99)

## 2013-07-16 MED ORDER — PHENAZOPYRIDINE HCL 200 MG PO TABS: 200.0000 mg | ORAL_TABLET | Freq: Three times a day (TID) | ORAL | Status: DC | PRN

## 2013-07-16 MED ORDER — CIPROFLOXACIN HCL 500 MG PO TABS: 500.0000 mg | ORAL_TABLET | Freq: Two times a day (BID) | ORAL | Status: DC

## 2013-07-16 NOTE — Patient Instructions (Signed)
Drink plenty of fluids, at least 64 ounces/day Continue to urinate after sexual intercourse Return if your symptoms worsen, do not improve, or you begin to have back pain

## 2013-07-16 NOTE — Progress Notes (Signed)
I have examined this patient along with the student and agree.  

## 2013-07-16 NOTE — Progress Notes (Signed)
Subjective:    Patient ID: Barbara Rogers, female    DOB: 07-Jul-1981, 32 y.o.   MRN: [id number]  HPI  Barbara Rogers is a 32 YO female with a history of nephrolithiasis and recurrent UTIs presents today with a two day history of dysuria and frequency.  The patient states she has the need to urinate 4-5 times/hour since Wednesday (07/14/13).  Her most recent UTI was October 1st, 2014.  She began taking pyridium immediately at the onset of her symptoms on Wednesday.  She denies any flank pain, abdominal pain, nausea, vomiting, vaginal symptoms, hematuria, or urine odor.  She states she has been drinking plenty of fluids and cranberry juice.  She is currently sexually active but states she consistently urinates after sexual intercourse.     Past Medical History  Diagnosis Date  . Allergy   . Migraine   . Anxiety   . Depression   . Dysmenorrhea   . PMDD (premenstrual dysphoric disorder)   . Insomnia   . Nephrolithiasis   . Finger fracture, left 10/31/2012    LEFT 4th finger  . Chronic left shoulder pain 08/2012     Review of Systems  Constitutional: Negative for fever and chills.  Gastrointestinal: Negative for nausea, abdominal pain, diarrhea and constipation.  Genitourinary: Positive for dysuria, urgency and frequency. Negative for hematuria, flank pain, decreased urine volume, vaginal discharge, difficulty urinating, vaginal pain and pelvic pain.  Musculoskeletal: Negative for back pain.       Objective:   Physical Exam Barbara Rogers  is a pleasant well-developed, well-nourished Caucasian female in no acute distress Heart: Normal rate and rhythm, no murmurs, rubs, or gallops Lungs: Clear to auscultation bilaterally, no wheezing, rhonchi, or rales Abdomen: Abdomen with normoactive bowel sounds, soft with mild tenderness to palpation of suprapubic region, no masses or hepatosplenomegaly.  Negative CVA tenderness  BP 116/72  Pulse 98  Temp(Src) 98 F (36.7 C) (Oral)  Resp 20  Ht 5'  8" (1.727 m)  Wt 166 lb (75.297 kg)  BMI 25.25 kg/m2  SpO2 95%  LMP 06/24/2013  Results for orders placed in visit on 07/16/13  POCT UA - MICROSCOPIC ONLY      Result Value Range   WBC, Ur, HPF, POC 3-12     RBC, urine, microscopic 1-4     Bacteria, U Microscopic 3+     Mucus, UA Neg     Epithelial cells, urine per micros 3-10     Crystals, Ur, HPF, POC neg     Casts, Ur, LPF, POC neg     Yeast, UA neg     Renal tubular cells Positive    POCT URINALYSIS DIPSTICK      Result Value Range   Color, UA Orange     Clarity, UA cloudy     Glucose, UA 100mg      Bilirubin, UA small     Ketones, UA Trace     Spec Grav, UA 1.020     Blood, UA Neg     pH, UA 6.5     Protein, UA 30     Urobilinogen, UA 2.0     Nitrite, UA Pos     Leukocytes, UA large (3+)    GLUCOSE, POCT (MANUAL RESULT ENTRY)      Result Value Range   POC Glucose 109 (*) 70 - 99 mg/dl  POCT GLYCOSYLATED HEMOGLOBIN (HGB A1C)      Result Value Range   Hemoglobin A1C 4.8  Assessment & Plan:  Frequency of urination - Plan: POCT UA - Microscopic Only, POCT urinalysis dipstick, ciprofloxacin (CIPRO) 500 MG tablet  Dysuria - Urine culture, phenazopyridine (PYRIDIUM) 200 MG tablet  Glucosuria - Plan: POCT glucose (manual entry)  Hyperglycemia - Plan: POCT glycosylated hemoglobin (Hb A1C)   Patient Instructions  Drink plenty of fluids, at least 64 ounces/day Continue to urinate after sexual intercourse Return if your symptoms worsen, do not improve, or you begin to have back pain

## 2013-07-17 ENCOUNTER — Encounter: Payer: Self-pay | Admitting: Physician Assistant

## 2013-07-23 ENCOUNTER — Encounter: Payer: Self-pay | Admitting: Physician Assistant

## 2013-07-26 ENCOUNTER — Encounter: Payer: Self-pay | Admitting: Physician Assistant

## 2013-07-28 ENCOUNTER — Encounter: Payer: Self-pay | Admitting: Physician Assistant

## 2013-07-28 MED ORDER — PREGABALIN 50 MG PO CAPS: 50.0000 mg | ORAL_CAPSULE | Freq: Three times a day (TID) | ORAL | Status: DC

## 2013-07-28 NOTE — Telephone Encounter (Signed)
Patient notified via My Chart.  Meds ordered this encounter  Medications  . pregabalin (LYRICA) 50 MG capsule    Sig: Take 1 capsule (50 mg total) by mouth 3 (three) times daily.    Dispense:  90 capsule    Refill:  1    Order Specific Question:  Supervising Provider    Answer:  DOOLITTLE, ROBERT P [[id number]]

## 2013-08-04 ENCOUNTER — Encounter: Payer: Self-pay | Admitting: Physician Assistant

## 2013-08-04 ENCOUNTER — Other Ambulatory Visit: Payer: Self-pay | Admitting: Physician Assistant

## 2013-08-04 MED ORDER — HYDROCODONE-ACETAMINOPHEN 10-325 MG PO TABS: ORAL_TABLET | ORAL | Status: DC

## 2013-08-04 NOTE — Telephone Encounter (Signed)
Pt came in requesting refill on her Hydrocodone.

## 2013-08-06 NOTE — Telephone Encounter (Signed)
[name] I see this was printed on 11/26 but can not locate the Rx for the patient, she states she did not get it, was work note. I do not see it in the pick up drawer.

## 2013-08-09 NOTE — Telephone Encounter (Signed)
Re-printed Rx.  Patient notified by MyChart.  Meds ordered this encounter  Medications  . HYDROcodone-acetaminophen (NORCO) 10-325 MG per tablet    Sig: TAKE 1 TABLET BY MOUTH EVERY 8 HOURS AS NEEDED FOR PAIN    Dispense:  90 tablet    Refill:  0    Order Specific Question:  Supervising Provider    Answer:  DOOLITTLE, ROBERT P [[id number]]

## 2013-08-10 ENCOUNTER — Telehealth: Payer: Self-pay | Admitting: *Deleted

## 2013-08-10 NOTE — Telephone Encounter (Signed)
Pt notified that rx for norco is ready for pickup

## 2013-08-12 ENCOUNTER — Ambulatory Visit: Payer: BC Managed Care – PPO | Admitting: Physician Assistant

## 2013-08-18 ENCOUNTER — Ambulatory Visit (INDEPENDENT_AMBULATORY_CARE_PROVIDER_SITE_OTHER): Payer: BC Managed Care – PPO | Admitting: Physician Assistant

## 2013-08-18 ENCOUNTER — Other Ambulatory Visit: Payer: Self-pay | Admitting: Radiology

## 2013-08-18 VITALS — BP 110/60 | HR 106 | Temp 98.4°F | Resp 18 | Ht 67.5 in | Wt 170.0 lb

## 2013-08-18 DIAGNOSIS — E039 Hypothyroidism, unspecified: Secondary | ICD-10-CM

## 2013-08-18 DIAGNOSIS — N946 Dysmenorrhea, unspecified: Secondary | ICD-10-CM

## 2013-08-18 DIAGNOSIS — R81 Glycosuria: Secondary | ICD-10-CM

## 2013-08-18 DIAGNOSIS — G8929 Other chronic pain: Secondary | ICD-10-CM

## 2013-08-18 DIAGNOSIS — F3281 Premenstrual dysphoric disorder: Secondary | ICD-10-CM

## 2013-08-18 DIAGNOSIS — M25519 Pain in unspecified shoulder: Secondary | ICD-10-CM

## 2013-08-18 DIAGNOSIS — N390 Urinary tract infection, site not specified: Secondary | ICD-10-CM

## 2013-08-18 DIAGNOSIS — N943 Premenstrual tension syndrome: Secondary | ICD-10-CM

## 2013-08-18 LAB — POCT UA - MICROSCOPIC ONLY
Casts, Ur, LPF, POC: NEGATIVE
Crystals, Ur, HPF, POC: NEGATIVE

## 2013-08-18 LAB — POCT URINALYSIS DIPSTICK
Blood, UA: NEGATIVE
Glucose, UA: 100
Spec Grav, UA: >=1.03
pH, UA: 5

## 2013-08-18 LAB — GLUCOSE, POCT (MANUAL RESULT ENTRY): POC Glucose: 86 mg/dl (ref 70–99)

## 2013-08-18 MED ORDER — LEVOTHYROXINE SODIUM 50 MCG PO TABS: 50.0000 ug | ORAL_TABLET | Freq: Every day | ORAL | Status: DC

## 2013-08-18 MED ORDER — METHOCARBAMOL 500 MG PO TABS: 500.0000 mg | ORAL_TABLET | Freq: Four times a day (QID) | ORAL | Status: DC | PRN

## 2013-08-18 MED ORDER — PREGABALIN 100 MG PO CAPS: 100.0000 mg | ORAL_CAPSULE | Freq: Three times a day (TID) | ORAL | Status: DC

## 2013-08-18 MED ORDER — PREGABALIN 50 MG PO CAPS: 50.0000 mg | ORAL_CAPSULE | Freq: Three times a day (TID) | ORAL | Status: DC

## 2013-08-18 MED ORDER — LIDOCAINE 5 % EX PTCH: 2.0000 | MEDICATED_PATCH | CUTANEOUS | Status: DC

## 2013-08-18 NOTE — Telephone Encounter (Signed)
Patient states Lyrica 100mg  tid/ but the Rx for Lyrica you gave her was 50 mg tid/ quantity of 270 , she will hold this, told her I will call when corrected, pended

## 2013-08-18 NOTE — Patient Instructions (Signed)
Get plenty of rest and drink at least 64 ounces of water daily. 

## 2013-08-18 NOTE — Addendum Note (Signed)
Addended by: Fernande Bras on: 08/18/2013 03:36 PM   Modules accepted: Orders

## 2013-08-18 NOTE — Progress Notes (Signed)
Subjective:    Patient ID: Barbara Rogers, female    DOB: [birth year], 32 y.o.   MRN: [id number]  Chief Complaint  Patient presents with  . Follow-up    uti  . Shoulder Pain    on going     HPI 1. No current urinary symptoms.  Thinks sometime she doesn't completely empty her bladder. Drinks lots of liquids.  Doesn't hold urine when feels the urge. Wipes from front to back.  Urinates after sex.  2. Continues. Tolerating Lyrica, but doesn't see a vast improvement.  Interested in increasing the dose. Also needs a refill of the lidocaine patches. Working 15 hour days. Still wants prednisone dose pack, so that she can do more work during the holiday season for extra pay. Is using the hydrocodone (initially prescribed for her dysmenorrhea) by taking 1/2 tablet + Acetaminophen Extra Strength Q4 hours.  "I feel like I'm playing sports every day."  Dr. Charlett Blake had recommended PT, and now that she can afford it, she plans to go, starting 09/10/2013. Right now, working is a necessity. Budget is very very tight.  3. GYN recommended Tramadol for the dysmenorrhea, but it was not helpful, so she stopped. Correcting her low thyroid function has reduced her PMDD symptoms considerably. She feels so much better, and thinks she's on the right dose of levothyroxine at this time.   Review of Systems As above.    Objective:   Physical Exam Blood pressure 110/60, pulse 106, temperature 98.4 F (36.9 C), temperature source Oral, resp. rate 18, height 5' 7.5" (1.715 m), weight 170 lb (77.111 kg), last menstrual period 07/25/2013, SpO2 100.00%. Body mass index is 26.22 kg/(m^2). Well-developed, well nourished WF who is awake, alert and oriented, in NAD. HEENT: [place]/AT, sclera and conjunctiva are clear.  EAC are patent, TMs are normal in appearance. Nasal mucosa is pink and moist. OP is clear. Neck: supple, non-tender, no lymphadenopathy, thyromegaly. Heart: RRR, no murmur Lungs: normal effort, CTA Extremities: no  cyanosis, clubbing or edema. She has tenderness of the trapezius muscle on the LEFT, that extends into the LEFT neck.  There is no bony tenderness of the c-spine and no tenderness of the shoulder. Good strength and ROM of the neck and shoulder. Skin: warm and dry without rash. Psychologic: good mood and appropriate affect, normal speech and behavior.    Results for orders placed in visit on 08/18/13  POCT UA - MICROSCOPIC ONLY      Result Value Range   WBC, Ur, HPF, POC 2-5     RBC, urine, microscopic 1-3     Bacteria, U Microscopic 2+     Mucus, UA pos     Epithelial cells, urine per micros 0-1     Crystals, Ur, HPF, POC neg     Casts, Ur, LPF, POC neg     Yeast, UA neg    POCT URINALYSIS DIPSTICK      Result Value Range   Color, UA yellow     Clarity, UA clear     Glucose, UA 100     Bilirubin, UA small     Ketones, UA 15     Spec Grav, UA >=1.030     Blood, UA neg     pH, UA 5.0     Protein, UA 30     Urobilinogen, UA 0.2     Nitrite, UA neg     Leukocytes, UA Negative    GLUCOSE, POCT (MANUAL RESULT ENTRY)  Result Value Range   POC Glucose 86  70 - 99 mg/dl       Assessment & Plan:  1. UTI (urinary tract infection) Asymptomatic today, and no evidence of infection - POCT UA - Microscopic Only - POCT urinalysis dipstick  2. Chronic left shoulder pain Continue current regimen.  I encouraged her to start PT sooner. I am not willing to provide steroids so that she can increase her work hours as that will exacerbate the issue, but once she can slow down and rest, I will be willing to consider it. - lidocaine (LIDODERM) 5 %; Place 2 patches onto the skin daily. Remove & Discard patch within 12 hours or as directed by MD  Dispense: 180 patch; Refill: 0 - methocarbamol (ROBAXIN) 500 MG tablet; Take 1 tablet (500 mg total) by mouth every 6 (six) hours as needed for muscle spasms.  Dispense: 120 tablet; Refill: 3  3. Dysmenorrhea Much improved with treatment of thyroid  dysfunction.   - methocarbamol (ROBAXIN) 500 MG tablet; Take 1 tablet (500 mg total) by mouth every 6 (six) hours as needed for muscle spasms.  Dispense: 120 tablet; Refill: 3  4. PMDD (premenstrual dysphoric disorder) Much improved with treatment of thyroid dysfunction.  5. Hypothyroid Update lab today - TSH  6. Glucosuria Glucose again is normal (non-fasting).  A1C was <5% last check. - POCT glucose (manual entry)   Fernande Bras, PA-C Physician Assistant-Certified [facility]

## 2013-08-18 NOTE — Telephone Encounter (Signed)
Advised her ready, removed 50 mg from med list

## 2013-08-18 NOTE — Addendum Note (Signed)
Addended byCaffie Damme on: 08/18/2013 10:08 AM   Modules accepted: Orders, Medications

## 2013-08-24 ENCOUNTER — Encounter: Payer: Self-pay | Admitting: Physician Assistant

## 2013-08-25 ENCOUNTER — Encounter: Payer: Self-pay | Admitting: Physician Assistant

## 2013-08-27 ENCOUNTER — Encounter: Payer: Self-pay | Admitting: Physician Assistant

## 2013-08-28 NOTE — Telephone Encounter (Signed)
Please print the letter I wrote for this patient  Today.  It was sent to her MyChart account, but she'd like to pick up a printed copy.  Thanks, Khadejah Son

## 2013-08-30 ENCOUNTER — Telehealth: Payer: Self-pay

## 2013-08-30 NOTE — Telephone Encounter (Signed)
I haven't received her My Chart message yet.  Please let her know that those are side effects of the medication. Go back to 25 mcg, by taking 1/2 tablet of the 50 mcg.  Once her symptoms resolve, we can try again, or try an in between dose of 37.5 mcg.

## 2013-08-30 NOTE — Telephone Encounter (Signed)
Barbara Rogers  Patient is concerned - thinks new meds for thyroid is causing loss of appetite. Patient is  Aggrevated, agitated.   Doesn't want to eat.  Sent you a message via e-mail.    [phone]

## 2013-08-31 ENCOUNTER — Encounter: Payer: Self-pay | Admitting: *Deleted

## 2013-08-31 NOTE — Telephone Encounter (Signed)
Nor have I . Called her to advise, left message for her that I am sending a my chart message.

## 2013-09-05 ENCOUNTER — Telehealth: Payer: Self-pay

## 2013-09-05 NOTE — Telephone Encounter (Signed)
Patient needs clarification on return to work note. Is unsure which date she supposed to go back  [phone]

## 2013-09-06 NOTE — Telephone Encounter (Signed)
Barbara Rogers, pt wants to know if it's ok to extend her work note thru 12/28. She is going to work today. If this is ok, let me know and I'll type it up and print it for her. She wants to come and pick it up. She has an appt with Dr. Audie Box 09/16/13.

## 2013-09-06 NOTE — Telephone Encounter (Signed)
Her note was 12/16-19. If she hasn't been back to work yet, and so is requesting a note from 12/16-28, I need more information. This is the sort of thing that she should have come in for if she wasn't able to RTW.

## 2013-09-06 NOTE — Telephone Encounter (Signed)
OK 

## 2013-09-06 NOTE — Telephone Encounter (Addendum)
Called her, she states she is sick from her thyroid meds, and sick with a stomach virus and sick with her period. She states she could not get out of bed. She also states she had flu symptoms and she now feels better. She wants the note Apolonio Schneiders has given also this excuses her until 08/31/13. She states she feels better now.

## 2013-09-14 DIAGNOSIS — Z0271 Encounter for disability determination: Secondary | ICD-10-CM

## 2013-09-16 ENCOUNTER — Ambulatory Visit (INDEPENDENT_AMBULATORY_CARE_PROVIDER_SITE_OTHER): Payer: BC Managed Care – PPO | Admitting: Gynecology

## 2013-09-16 ENCOUNTER — Encounter: Payer: Self-pay | Admitting: Gynecology

## 2013-09-16 DIAGNOSIS — N946 Dysmenorrhea, unspecified: Secondary | ICD-10-CM

## 2013-09-16 DIAGNOSIS — N920 Excessive and frequent menstruation with regular cycle: Secondary | ICD-10-CM

## 2013-09-16 DIAGNOSIS — N943 Premenstrual tension syndrome: Secondary | ICD-10-CM

## 2013-09-16 NOTE — Telephone Encounter (Signed)
Make sure that the patient is actively being seen by her psychiatrist. She does need to be monitored closely and the psychiatrist is the best one to do this. The only reason I think Depo-Lupron would be worth trying is that if she is considering hysterectomy with removal of her ovaries that is a permanent situation and if she would have a bad reaction to the Depo-Lupron meaning a bad reaction to lack of hormones from her ovaries that she will have the same reaction to a hysterectomy but will not wear off as Depo-Lupron well.

## 2013-09-16 NOTE — Patient Instructions (Signed)
Followup for ultrasound as scheduled. Office will contact you to arrange surgery.

## 2013-09-16 NOTE — Progress Notes (Signed)
Patient presents having recently seen [facility] complaining of continued dysmenorrhea and significant PMDD such that she misses work during the week of her menses each month due to not only her pain but significant depression and anxiety. Being followed by a psychiatrist. Patient presents requesting consideration for hysterectomy. She has a ParaGard IUD. Unable to tolerate hormone containing contraception to include pills and a Mirena IUD due to significant emotional response and depression. Has been having her thyroid adjusted recently which helps some of her symptoms but still unacceptable. Denies any significant evaluation as far as her dysmenorrhea.  Exam with Kim assistant Abdomen soft nontender without masses guarding rebound organomegaly. Pelvic external BUS vagina normal. Cervix normal with IUD string visualized. Uterus normal size midline mobile nontender. Adnexa without masses or tenderness.  Assessment and plan: Significant dysmenorrhea with heavy her menses. ParaGard IUD in place. Her pain preceded this. Discussed possibilities to include endometriosis. Recommended starting with ultrasound. Patient very anxious to accelerate her evaluation and I suggested then that we proceed with diagnostic laparoscopy. I reviewed what is involved with the procedure in general and she wants to proceed with this and we'll schedule this also. Conservative management of her symptoms reviewed. Possible attempt at estrogen low-dose supplementation during her menses discussed as well as possible trial of Depo-Lupron. She was very strongly wanting hysterectomy to include removing her ovaries and I reviewed with her that if I would have her consider that it would be following her course of Depo-Lupron to get a clear picture as to what her symptomatology would be like after suppression of her ovarian function. She states several times during the visit it never wanted children and this is not an issue as far as hysterectomy is  concerned. I reviewed with her that we need to proceed in a stepwise fashion and will start with ultrasound and laparoscopy possible one-month trial of Depo-Lupron following to see how she responds to this.

## 2013-09-17 ENCOUNTER — Telehealth: Payer: Self-pay

## 2013-09-17 NOTE — Telephone Encounter (Signed)
Patient contacted Dr. Loetta Rough by email:"Hey Dr Phineas Real. Since I have had so much trouble with progestins on the past do you think the depo will be any different. That has been my historical problem with Birth Control. Is there any other option? I wish it were easy but I need you to understand that I am going into a nervous breakdown every month like clock work and I am scared to make it any worse. I know that you have my best interest in mind but it just seems like the things that help everyone else cause me more problems. I just don't know how much more down the rabbit hole I can go without needing someone to monitor me or be with me 24-7. When I am not on my period I'm nor sad and weepy I don't question my position in life. I don't sit around dwelling on dead family member or being afraid about the ones that are left. But during my week of terror I hate myself. I don't feel loved. I feel like the whole world is set up against me. My life would be so much better without hitting this miserable low for a week every month. Thanks for your time. I don't mean to stress things to you like you don't understand but I feel that I am a very drastic case and I can't find another woman that goes through what I do and a lot of people don't believe how serious and detrimental this is to my life."  Dr Loetta Rough replied "Make sure that the patient is actively being seen by her psychiatrist. She does need to be monitored closely and the psychiatrist is the best one to do this. The only reason I think Depo-Lupron would be worth trying is that if she is considering hysterectomy with removal of her ovaries that is a permanent situation and if she would have a bad reaction to the Depo-Lupron meaning a bad reaction to lack of hormones from her ovaries that she will have the same reaction to a hysterectomy but will not wear off as Depo-Lupron well".

## 2013-09-17 NOTE — Telephone Encounter (Signed)
I contacted patient and relayed what Dr. Loetta Rough said.  She said she is actively being seen by a psychiatrist.  She did mention that she does not always take the meds prescribed for her because she does not feel well taking some of them. She said she does let her psychiatrist know about this.  I did relay to patient what Dr. Loetta Rough said below.  She has u/s scheduled and will follow then.

## 2013-09-20 ENCOUNTER — Telehealth: Payer: Self-pay | Admitting: Gynecology

## 2013-09-20 NOTE — Telephone Encounter (Signed)
I called the patient in followup of her recent visit and her recent MyChart after visit questionnaire where patient reports that she was "very unhappy" after her visit stating that "she was offered a battery of tests that seem to rob her insurance and not help her at any time in the near future". She questioned "why did they treat me like I'm mentally ill? Sick of this crap. You have been ignoring me for years. Time for a new practice."  I reviewed with the patient that I was sorry that she was unhappy with her most recent visit and attempted to explore the reasons for this.  I reviewed with her given her recent e-mail note 09/17/2013 my recommendation to make sure that she is actively seen by her psychiatrist was based on statements such as "during my week of terror I hate myself. I don't feel loved. I feel like the whole world is set up against me". I just wanted to make sure that she was actively being monitored for my concern that she would have feelings of hurting herself or others. She was very concerned about trying Depo-Lupron having read the side effects such as depression and other emotional effects. I reviewed with her that this would mimic feelings she may have after removing her ovaries as this is what she wants done and my concern is that this is a permanent situation and I would much rather see how she responds to a temporary treatment such as Depo-Lupron before committing to removing her ovaries. She also pointed out past experiences where she felt we were not addressing her problems. I discussed with her that her best medical care is when she has confidence in her healthcare providers and if she has any question as to appropriateness of care that a second opinion would certainly be warranted. She ended her e-mail note with "time for new practice" which certainly reinforces her lack of confidence in her current situation and I strongly recommended that she pursue a second opinion with another  gynecologist for either second opinion or ongoing medical care. I made it very clear that we are not withdrawing from her care but given her verbalized lack of confidence I think that she would be much more comfortable followed elsewhere. I told her that we would be more than happy to provide copies of her medical record to any future treating physicians and I would also be glad to speak to them if there was any questions.

## 2013-09-23 ENCOUNTER — Ambulatory Visit (INDEPENDENT_AMBULATORY_CARE_PROVIDER_SITE_OTHER): Payer: BC Managed Care – PPO | Admitting: Physician Assistant

## 2013-09-23 ENCOUNTER — Encounter: Payer: Self-pay | Admitting: Physician Assistant

## 2013-09-23 VITALS — BP 106/68 | HR 69 | Temp 97.9°F | Resp 16 | Ht 68.0 in | Wt 172.4 lb

## 2013-09-23 DIAGNOSIS — E039 Hypothyroidism, unspecified: Secondary | ICD-10-CM

## 2013-09-23 DIAGNOSIS — Z23 Encounter for immunization: Secondary | ICD-10-CM

## 2013-09-23 DIAGNOSIS — N946 Dysmenorrhea, unspecified: Secondary | ICD-10-CM

## 2013-09-23 DIAGNOSIS — F3281 Premenstrual dysphoric disorder: Secondary | ICD-10-CM

## 2013-09-23 DIAGNOSIS — N943 Premenstrual tension syndrome: Secondary | ICD-10-CM

## 2013-09-23 LAB — TSH: TSH: 5.082 u[IU]/mL — ABNORMAL HIGH (ref 0.350–4.500)

## 2013-09-23 MED ORDER — NORETHINDRONE 0.35 MG PO TABS: 1.0000 | ORAL_TABLET | Freq: Every day | ORAL | Status: DC

## 2013-09-23 NOTE — Progress Notes (Signed)
   Subjective:    Patient ID: Barbara Rogers, female    DOB: [birth year], 33 y.o.   MRN: [id number]  PCP: Meilyn Heindl, PA-C  Chief Complaint  Patient presents with  . Advice Only    concerning hormone medication  . Referral    to another OB-GYN   Medications, allergies, past medical history, surgical history, family history, social history and problem list reviewed and updated.  HPI Presents wanting referral for hysterectomy. Has long had severe cramping and heavy bleeding, as well as significant and disruptive irritability and mood changes associated with menses.  Her migraines have also been triggered by her periods in the past.  She has tried COC and Mirena, but has not tolerated them at all.  In trying to find a method of contraception that would also address her dysmenorrhea and PMDD, she had 3 unplanned/undesired pregnancies.  About 2 years ago she had a Paragard IUD placed, but continues to have the PMDD and dysmenorrhea.  She asked her GYN for a hysterectomy, but was turned down until she had a pelvic US, an exploratory laparoscopy and a trial of Depo-Provera.  She didn't feel listened to, and was particularly concerned about her previous experience with progesterone, so did not pursue the additional evaluation.  She is now interested in a second opinion.  She also states that she's wiling to try oral progesterone, which she can discontinue if she has significant adverse effects.  Did not tolerate the increased dose of the levothyroxine.  Went back to 25 mcg.  Review of Systems As above.    Objective:   Physical Exam  Filed Vitals:   09/23/13 1357  BP: 106/68  Pulse: 69  Temp: 97.9 F (36.6 C)  Resp: 16   WDWNWF, A&O x 3.      Assessment & Plan:  1. Need for influenza vaccination - Flu Vaccine QUAD 36+ mos IM  2. PMDD (premenstrual dysphoric disorder) - Ambulatory referral to Gynecology  3. Dysmenorrhea - Ambulatory referral to Gynecology - norethindrone  (MICRONOR,CAMILA,ERRIN) 0.35 MG tablet; Take 1 tablet (0.35 mg total) by mouth daily.  Dispense: 3 Package; Refill: 4  4. Hypothyroid Continue current dose pending lab results. - TSH   Fara Chute, PA-C Physician Assistant-Certified [facility]

## 2013-09-23 NOTE — Patient Instructions (Signed)
I will contact you with your lab results as soon as they are available.   If you have not heard from me in 2 weeks, please contact me.  The fastest way to get your results is to register for My Chart (see the instructions on the last page of this printout).  If you have not heard anything regarding the referral in 2 weeks, please contact our office. Try the progestin-only pill.  You may stop it at any time, and expect to have a withdrawal bleed. If you take the pills continuously, you may not have regular periods, though you may have some breakthrough bleeding. If you have breakthrough bleeding, stop the pills for 3-5 days to give yourself a withdrawal bleed, then restart them.

## 2013-09-24 ENCOUNTER — Telehealth: Payer: Self-pay

## 2013-09-24 NOTE — Telephone Encounter (Signed)
[name],  I put the FMLA paperwork back in your box. There are 2 FMLA forms (one from 4/14 and the other from 7/14) scanned in the chart under the media tab on 04/19/13. Let me know if you need anything else.  Sherrie Mustache

## 2013-09-27 ENCOUNTER — Telehealth: Payer: Self-pay | Admitting: Obstetrics & Gynecology

## 2013-09-27 NOTE — Telephone Encounter (Signed)
LMTCB to schedule an appointment for doctor referral.

## 2013-09-28 NOTE — Telephone Encounter (Signed)
New forms completed.

## 2013-09-29 ENCOUNTER — Ambulatory Visit: Payer: BC Managed Care – PPO | Admitting: Gynecology

## 2013-09-29 ENCOUNTER — Encounter: Payer: Self-pay | Admitting: Physician Assistant

## 2013-09-29 ENCOUNTER — Other Ambulatory Visit: Payer: BC Managed Care – PPO

## 2013-09-30 NOTE — Telephone Encounter (Signed)
Faxed copy to [employer] and left a copy for patient to pick up.

## 2013-10-03 ENCOUNTER — Encounter: Payer: Self-pay | Admitting: Physician Assistant

## 2013-10-04 MED ORDER — HYDROCODONE-ACETAMINOPHEN 10-325 MG PO TABS: ORAL_TABLET | ORAL | Status: DC

## 2013-10-04 NOTE — Telephone Encounter (Signed)
Meds ordered this encounter  Medications  . HYDROcodone-acetaminophen (NORCO) 10-325 MG per tablet    Sig: TAKE 1 TABLET BY MOUTH EVERY 8 HOURS AS NEEDED FOR PAIN    Dispense:  90 tablet    Refill:  0    Order Specific Question:  Supervising Provider    Answer:  Leandrew Koyanagi [[id number]]   Patient aware by My Chart

## 2013-10-11 ENCOUNTER — Other Ambulatory Visit: Payer: Self-pay | Admitting: Physician Assistant

## 2013-10-13 NOTE — Telephone Encounter (Signed)
[name], I don't see this med on pt's med list at Jan OV and only see it Rxd on 09/14/13 by hist prov. Do you Rx this for pt and want to RF?

## 2013-10-14 ENCOUNTER — Encounter: Payer: Self-pay | Admitting: Physician Assistant

## 2013-10-16 ENCOUNTER — Encounter: Payer: Self-pay | Admitting: Physician Assistant

## 2013-10-18 ENCOUNTER — Ambulatory Visit (INDEPENDENT_AMBULATORY_CARE_PROVIDER_SITE_OTHER): Payer: BC Managed Care – PPO | Admitting: Physician Assistant

## 2013-10-18 VITALS — BP 116/72 | HR 80 | Temp 97.9°F | Resp 20 | Ht 69.0 in | Wt 182.0 lb

## 2013-10-18 DIAGNOSIS — F3281 Premenstrual dysphoric disorder: Secondary | ICD-10-CM

## 2013-10-18 DIAGNOSIS — N943 Premenstrual tension syndrome: Secondary | ICD-10-CM

## 2013-10-18 DIAGNOSIS — N946 Dysmenorrhea, unspecified: Secondary | ICD-10-CM

## 2013-10-18 DIAGNOSIS — F411 Generalized anxiety disorder: Secondary | ICD-10-CM

## 2013-10-18 DIAGNOSIS — F419 Anxiety disorder, unspecified: Secondary | ICD-10-CM

## 2013-10-18 DIAGNOSIS — E039 Hypothyroidism, unspecified: Secondary | ICD-10-CM

## 2013-10-18 MED ORDER — DIAZEPAM 10 MG PO TABS: 5.0000 mg | ORAL_TABLET | Freq: Three times a day (TID) | ORAL | Status: DC | PRN

## 2013-10-18 NOTE — Progress Notes (Signed)
   Subjective:    Patient ID: Barbara Rogers, female    DOB: 03-01-1981, 33 y.o.   MRN: [id number]   PCP: Prem Coykendall, PA-C  Chief Complaint  Patient presents with  . Anxiety    2 weeks  . Abdominal Pain    Medications, allergies, past medical history, surgical history, family history, social history and problem list reviewed and updated.   HPI  Presents to discuss her ongoing PMDD and dysmenorrhea.  She's hopeful to undergo a TAH, but her GYN has been reluctant to do so before exhausting all alternatives. Previously, she's been intolerant to estrogens and to Mirena IUD.  Her GYN wanted to try oral progesterone, but she wasn't willing, however decided to try it in hopes of moving forward towards TAH.   2-3 days after starting Micronor, started feeling more nervous/anxious than usual. Then lost appetite, developed constipation, nausea and vomiting. Insomnia got worse. Started Pedialyte, an OTC product for nausea, which has helped.  So did writing her feelings down on paper.  Xanax not helping.  Trazodone giving her a really dry mouth. "My mind starts going and I start worrying about people that I haven't cared about in years." Tearful, irritable, angry.  Feeling very cold. Cannot seem to get warm.  Doesn't tolerate the 50 mcg dose of levothyroxine  very well (feels like she gets the stomach flu), so takes 25 mcg most days, and only increases the dose to 50 mcg the week before her period. She's ready to see an endocrinologist.  Sees her dentist for tooth extraction tomorrow. Sees Dr. Toy Care next week.  GYN appointment is 2/24.  Review of Systems As above.    Objective:   Physical Exam  Blood pressure 116/72, pulse 80, temperature 97.9 F (36.6 C), resp. rate 20, height 5\' 9"  (1.753 m), weight 182 lb (82.555 kg), last menstrual period 10/04/2013, SpO2 97.00%. Body mass index is 26.86 kg/(m^2). Well-developed, well nourished WF who is awake, alert and oriented, in NAD but is a little  tearful intermittently. HEENT: [place]/AT, sclera and conjunctiva are clear.   Neck: supple, non-tender, no lymphadenopathy, thyromegaly. Heart: RRR, no murmur Lungs: normal effort, CTA Extremities: no cyanosis, clubbing or edema. Skin: warm and dry without rash. Psychologic: Normal speech and behavior.       Assessment & Plan:  1. PMDD (premenstrual dysphoric disorder) 2. Dysmenorrhea Continue Micronor for now.  Her side effects may lessen, though we won't know unless she tries to continue.  Proceed with the follow-up with GYN later this month, as planned.  3. Anxiety Exacerbated since restarting progesterone.  Hold alprazolam while using Valium. - diazepam (VALIUM) 10 MG tablet; Take 0.5-1 tablets (5-10 mg total) by mouth every 8 (eight) hours as needed for anxiety.  Dispense: 20 tablet; Refill: 0  4. Hypothyroid Continue on current regimen until she sees the endocrinologist. - Ambulatory referral to Endocrinology   Fara Chute, PA-C Physician Assistant-Certified [facility]

## 2013-10-19 ENCOUNTER — Encounter: Payer: Self-pay | Admitting: Physician Assistant

## 2013-10-20 ENCOUNTER — Encounter: Payer: Self-pay | Admitting: Physician Assistant

## 2013-10-20 MED ORDER — OXYCODONE-ACETAMINOPHEN 5-325 MG PO TABS: 1.0000 | ORAL_TABLET | ORAL | Status: DC | PRN

## 2013-10-20 NOTE — Telephone Encounter (Signed)
Unfortunately, not yet.  I've been told that we sent them to the "central" scanning office, and they have not yet been scanned in.  If you can get a copy, I'm happy to correct it.  Otherwise, we'll either have to re-create it or wait until it gets scanned.  Let me know.

## 2013-10-20 NOTE — Telephone Encounter (Signed)
Patient notified by My Chart.  Meds ordered this encounter  Medications  . oxyCODONE-acetaminophen (PERCOCET/ROXICET) 5-325 MG per tablet    Sig: Take 1 tablet by mouth every 4 (four) hours as needed for severe pain. DO NOT USE WITH HYDROCODONE    Dispense:  10 tablet    Refill:  0    Order Specific Question:  Supervising Provider    Answer:  DOOLITTLE, ROBERT P [[id number]]

## 2013-10-21 ENCOUNTER — Encounter: Payer: Self-pay | Admitting: Physician Assistant

## 2013-10-23 ENCOUNTER — Other Ambulatory Visit: Payer: Self-pay | Admitting: Physician Assistant

## 2013-10-24 ENCOUNTER — Encounter: Payer: Self-pay | Admitting: Physician Assistant

## 2013-10-24 ENCOUNTER — Telehealth: Payer: Self-pay

## 2013-10-24 NOTE — Telephone Encounter (Signed)
Patient needs a refill on Percocet. States she just had surgery on her mouth and she is in pain.  [phone]

## 2013-10-25 ENCOUNTER — Other Ambulatory Visit: Payer: Self-pay | Admitting: Physician Assistant

## 2013-10-25 MED ORDER — OXYCODONE-ACETAMINOPHEN 5-325 MG PO TABS: 1.0000 | ORAL_TABLET | ORAL | Status: DC | PRN

## 2013-10-25 NOTE — Telephone Encounter (Signed)
Rx printed, Patient notified via My Chart.

## 2013-10-25 NOTE — Telephone Encounter (Signed)
5 teeth extracted last Tuesday 10/19/13. Having pulsating , throbbing pain on the left side only. Has tried a baby tether, and a lidocaine patch cut to suck on, This was recommended by the Pharmacist. She originally was advised by her surgeon to take motrin. That's not helping. Has called and the response she received was surgeon would not be in for the next two days due to the bad weather and that only the surgeon can prescribe the percocet.

## 2013-10-25 NOTE — Telephone Encounter (Signed)
Pt picked up.

## 2013-10-25 NOTE — Telephone Encounter (Signed)
Pain medication following surgery should come from the person who performed the procedure.  Please get the details.  This is one of my regular patients, and the My Chart message suggested that she was having some trouble.    I MAY be able to refill the percocet, but I don't like to, so I need additional information.

## 2013-11-02 ENCOUNTER — Encounter: Payer: BC Managed Care – PPO | Admitting: Gynecology

## 2013-11-03 ENCOUNTER — Ambulatory Visit (INDEPENDENT_AMBULATORY_CARE_PROVIDER_SITE_OTHER): Payer: BC Managed Care – PPO | Admitting: Physician Assistant

## 2013-11-03 ENCOUNTER — Telehealth: Payer: Self-pay | Admitting: Gynecology

## 2013-11-03 VITALS — BP 118/70 | HR 97 | Temp 98.2°F | Resp 16 | Ht 67.25 in | Wt 172.4 lb

## 2013-11-03 DIAGNOSIS — B9789 Other viral agents as the cause of diseases classified elsewhere: Secondary | ICD-10-CM

## 2013-11-03 DIAGNOSIS — G8929 Other chronic pain: Secondary | ICD-10-CM

## 2013-11-03 DIAGNOSIS — J45909 Unspecified asthma, uncomplicated: Secondary | ICD-10-CM

## 2013-11-03 DIAGNOSIS — N946 Dysmenorrhea, unspecified: Secondary | ICD-10-CM

## 2013-11-03 DIAGNOSIS — M25519 Pain in unspecified shoulder: Secondary | ICD-10-CM

## 2013-11-03 DIAGNOSIS — J069 Acute upper respiratory infection, unspecified: Secondary | ICD-10-CM

## 2013-11-03 DIAGNOSIS — M25512 Pain in left shoulder: Secondary | ICD-10-CM

## 2013-11-03 MED ORDER — LIDOCAINE 5 % EX PTCH: 2.0000 | MEDICATED_PATCH | CUTANEOUS | Status: DC

## 2013-11-03 MED ORDER — PREGABALIN 200 MG PO CAPS: 200.0000 mg | ORAL_CAPSULE | Freq: Three times a day (TID) | ORAL | Status: DC

## 2013-11-03 MED ORDER — MOMETASONE FUROATE 50 MCG/ACT NA SUSP: NASAL | Status: DC

## 2013-11-03 MED ORDER — ALBUTEROL SULFATE HFA 108 (90 BASE) MCG/ACT IN AERS: 2.0000 | INHALATION_SPRAY | RESPIRATORY_TRACT | Status: DC | PRN

## 2013-11-03 MED ORDER — BENZONATATE 100 MG PO CAPS: 100.0000 mg | ORAL_CAPSULE | Freq: Three times a day (TID) | ORAL | Status: DC | PRN

## 2013-11-03 MED ORDER — HYDROCOD POLST-CHLORPHEN POLST 10-8 MG/5ML PO LQCR: 5.0000 mL | Freq: Two times a day (BID) | ORAL | Status: DC | PRN

## 2013-11-03 MED ORDER — HYDROCODONE-ACETAMINOPHEN 10-325 MG PO TABS: ORAL_TABLET | ORAL | Status: DC

## 2013-11-03 NOTE — Telephone Encounter (Signed)
Starla me and sally got it worked out seeing lathrop 11/08/13 at 1:30

## 2013-11-03 NOTE — Progress Notes (Signed)
Subjective:    Patient ID: Barbara Rogers, female    DOB: Aug 11, 1981, 33 y.o.   MRN: [id number]   PCP: Jadyn Brasher, PA-C  Chief Complaint  Patient presents with  . Cough    x4 days  . Sore Throat  . Nasal Congestion  . Medication Refill    Albuterol    Medications, allergies, past medical history, surgical history, family history, social history and problem list reviewed and updated.   HPI  4 days of cough, nasal/sinus congestion, sore throat. Her grandmother is hospitalized with C diff, and she's taking care of her elderly grandfather. She's been around lots of sick people in the waiting areas in the hospital and is afraid she'll make her grandfather ill as well.  On a new medication, which is really helping her mood. Is alternating doses of levothyroxine 50 and 25 mcg, and feels like that dose is doing really well.  She's to see GYN soon to discuss the option of total abdominal hysterectomy.  She needs refills of several of her chronic medications.  Review of Systems No CP, SOB, HA, dizziness.    Objective:   Physical Exam  Blood pressure 118/70, pulse 97, temperature 98.2 F (36.8 C), temperature source Oral, resp. rate 16, height 5' 7.25" (1.708 m), weight 172 lb 6.4 oz (78.2 kg), last menstrual period 10/20/2013, SpO2 98.00%. Body mass index is 26.81 kg/(m^2). Well-developed, well nourished WF who is awake, alert and oriented, in NAD. HEENT: [place]/AT, PERRL, EOMI.  Sclera and conjunctiva are clear.  EAC are patent, TMs are normal in appearance. Nasal mucosa is congested, pink and moist. OP is clear. Neck: supple, non-tender, no lymphadenopathy, thyromegaly. Heart: RRR, no murmur Lungs: normal effort, CTA Extremities: no cyanosis, clubbing or edema. Skin: warm and dry without rash. Psychologic: good mood and appropriate affect, normal speech and behavior.       Assessment & Plan:  1. RAD (reactive airway disease) - albuterol (PROVENTIL HFA;VENTOLIN HFA)  108 (90 BASE) MCG/ACT inhaler; Inhale 2 puffs into the lungs every 4 (four) hours as needed for wheezing (cough, shortness of breath or wheezing.).  Dispense: 1 Inhaler; Refill: 1  2. Viral URI with cough Supportive care.  If symptoms persist, she will contact me for an antibiotic. - mometasone (NASONEX) 50 MCG/ACT nasal spray; 2 sprays in each nostril each day  Dispense: 17 g; Refill: 12 - benzonatate (TESSALON) 100 MG capsule; Take 1-2 capsules (100-200 mg total) by mouth 3 (three) times daily as needed for cough.  Dispense: 40 capsule; Refill: 0 - chlorpheniramine-HYDROcodone (TUSSIONEX PENNKINETIC ER) 10-8 MG/5ML LQCR; Take 5 mLs by mouth every 12 (twelve) hours as needed for cough (cough). No not use with Norco. Use for cough not relieved by Tessalon or if Tessalon not available.  Dispense: 100 mL; Refill: 0  3. Chronic left shoulder pain She had an appointment with ortho which has been reschedule - lidocaine (LIDODERM) 5 %; Place 2 patches onto the skin daily. Remove & Discard patch within 12 hours or as directed by MD  Dispense: 180 patch; Refill: 0 - pregabalin (LYRICA) 200 MG capsule; Take 1 capsule (200 mg total) by mouth 3 (three) times daily.  Dispense: 270 capsule; Refill: 0  4. Dysmenorrhea Proceed with visit with GYN - pregabalin (LYRICA) 200 MG capsule; Take 1 capsule (200 mg total) by mouth 3 (three) times daily.  Dispense: 270 capsule; Refill: 0 - HYDROcodone-acetaminophen (NORCO) 10-325 MG per tablet; TAKE 1 TABLET BY MOUTH EVERY 8 HOURS AS NEEDED  FOR PAIN  Dispense: 90 tablet; Refill: 0   Fara Chute, PA-C Physician Assistant-Certified [facility]

## 2013-11-03 NOTE — Telephone Encounter (Signed)
Patient had appt with Barbara Rogers on 11/02/13 for a new patient (dr referral) wants to see Barbara Rogers she doesn't have anything any time soon said that You promised her that she would get in soon. i scheduled her with silva on 11/15/13 for new pt referral. But really wants to see Barbara Rogers.

## 2013-11-08 ENCOUNTER — Encounter: Payer: BC Managed Care – PPO | Admitting: Gynecology

## 2013-11-08 NOTE — Telephone Encounter (Signed)
LMTCB re: 11/15/13 Acute slot with Dr. Carylon Perches

## 2013-11-09 NOTE — Telephone Encounter (Signed)
lmtcb re: 11/15/13 Acute slot with Dr. Carylon Perches

## 2013-11-15 ENCOUNTER — Telehealth: Payer: Self-pay

## 2013-11-15 ENCOUNTER — Encounter: Payer: BC Managed Care – PPO | Admitting: Obstetrics and Gynecology

## 2013-11-15 ENCOUNTER — Encounter: Payer: Self-pay | Admitting: Physician Assistant

## 2013-11-15 DIAGNOSIS — N946 Dysmenorrhea, unspecified: Secondary | ICD-10-CM

## 2013-11-15 NOTE — Telephone Encounter (Signed)
Patient says that she has lost her pain pills that we prescribed for her would like chelle to call her as soon as possible  [phone]

## 2013-11-16 MED ORDER — HYDROCODONE-ACETAMINOPHEN 10-325 MG PO TABS
ORAL_TABLET | ORAL | Status: DC
Start: 2013-11-16 — End: 2013-11-18

## 2013-11-16 NOTE — Telephone Encounter (Signed)
-  unable to take message   Pt has sent an email that was forwarded to you this morning also. Please advise if you would like me to call her back.

## 2013-11-16 NOTE — Telephone Encounter (Signed)
Patient lost several of her medications.  Refill pain medication (Dr. Toy Care will address the ones that she normally prescribes).  Patient notified by My Chart.  Meds ordered this encounter  Medications  . HYDROcodone-acetaminophen (NORCO) 10-325 MG per tablet    Sig: TAKE 1 TABLET BY MOUTH EVERY 8 HOURS AS NEEDED FOR PAIN    Dispense:  90 tablet    Refill:  0    Order Specific Question:  Supervising Provider    Answer:  DOOLITTLE, ROBERT P [[id number]]

## 2013-11-16 NOTE — Telephone Encounter (Signed)
Patient neeeds his rx to be a different strength for the insurance company to cover it as it is early,.  [phone]

## 2013-11-16 NOTE — Telephone Encounter (Signed)
Called pt to notify Rx is ready, but she stated that pharm won't RF and ins won't pay for her to get the same Rx again. Asked if I could send a message back to [name] to see if she can write for 5-325 mg instead with sig to take 2 tablets Q8hrs #180 or some other way that she can get it filled now? I have left Rx for 10-325 mg in my box until this is addressed.

## 2013-11-17 ENCOUNTER — Telehealth: Payer: Self-pay

## 2013-11-17 NOTE — Telephone Encounter (Signed)
Please advise 

## 2013-11-17 NOTE — Telephone Encounter (Signed)
Patient called once again requesting a response from [place] regarding her Hydrocodone 10 mg, but she want to request a 5 mg lower dosage in order for Pharmacy to fill it. Please call patient soon as possible.

## 2013-11-18 MED ORDER — HYDROCODONE-ACETAMINOPHEN 5-325 MG PO TABS: 1.0000 | ORAL_TABLET | Freq: Four times a day (QID) | ORAL | Status: DC | PRN

## 2013-11-18 NOTE — Telephone Encounter (Signed)
PT NOTIFIED THAT RX IS READY FOR PICKUP.  OTHER RX DESTROYED

## 2013-11-18 NOTE — Telephone Encounter (Signed)
Patient notified via My Chart

## 2013-11-18 NOTE — Telephone Encounter (Signed)
Meds ordered this encounter  Medications  . HYDROcodone-acetaminophen (NORCO) 5-325 MG per tablet    Sig: Take 1 tablet by mouth every 6 (six) hours as needed for moderate pain or severe pain.    Dispense:  30 tablet    Refill:  0    Order Specific Question:  Supervising Provider    Answer:  Leandrew Koyanagi [[id number]]    The patient needs to return the other prescription (for 10/325 mg) or the pharmacy is to call to verify they have destroyed it.  There is also a work note for her to pick up.  Thanks.

## 2013-11-19 ENCOUNTER — Encounter: Payer: BC Managed Care – PPO | Admitting: Gynecology

## 2013-11-23 ENCOUNTER — Encounter: Payer: Self-pay | Admitting: Physician Assistant

## 2013-11-23 DIAGNOSIS — F419 Anxiety disorder, unspecified: Secondary | ICD-10-CM

## 2013-11-24 ENCOUNTER — Encounter: Payer: Self-pay | Admitting: Physician Assistant

## 2013-11-24 MED ORDER — HYDROCODONE-ACETAMINOPHEN 5-325 MG PO TABS: 1.0000 | ORAL_TABLET | Freq: Four times a day (QID) | ORAL | Status: DC | PRN

## 2013-11-24 MED ORDER — DIAZEPAM 10 MG PO TABS: 5.0000 mg | ORAL_TABLET | Freq: Three times a day (TID) | ORAL | Status: DC | PRN

## 2013-11-24 NOTE — Telephone Encounter (Signed)
Rx's printed and signed

## 2013-11-25 MED ORDER — IPRATROPIUM BROMIDE 0.03 % NA SOLN: 2.0000 | Freq: Two times a day (BID) | NASAL | Status: DC

## 2013-11-25 NOTE — Addendum Note (Signed)
Addended by: Fara Chute on: 11/25/2013 04:57 PM   Modules accepted: Orders

## 2013-11-26 ENCOUNTER — Encounter: Payer: Self-pay | Admitting: Physician Assistant

## 2013-12-01 ENCOUNTER — Encounter: Payer: Self-pay | Admitting: Physician Assistant

## 2013-12-01 MED ORDER — HYDROCODONE-ACETAMINOPHEN 10-325 MG PO TABS: 1.0000 | ORAL_TABLET | Freq: Three times a day (TID) | ORAL | Status: DC | PRN

## 2013-12-01 NOTE — Telephone Encounter (Signed)
Patient notified via My Chart.  Meds ordered this encounter  Medications  . HYDROcodone-acetaminophen (NORCO) 10-325 MG per tablet    Sig: Take 1 tablet by mouth every 8 (eight) hours as needed for moderate pain or severe pain.    Dispense:  90 tablet    Refill:  0    Order Specific Question:  Supervising Provider    Answer:  DOOLITTLE, ROBERT P [[id number]]    

## 2013-12-07 ENCOUNTER — Encounter: Payer: BC Managed Care – PPO | Admitting: Gynecology

## 2013-12-07 ENCOUNTER — Telehealth: Payer: Self-pay | Admitting: Gynecology

## 2013-12-07 NOTE — Telephone Encounter (Signed)
FYI only--This patient called in because she missed her new patient appointment this morning with Dr. Charlies Constable. The patient reports she had to reschedule last time with short notice due to car trouble. 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 The patient is aware she must keep her next appointment and that we have made an exception for her missed appointment today due to the emergency. Patient is appreciative and rescheduled for 01/12/14 with Dr. Charlies Constable. She also declined to be on a cancellation list stating she is seeing an endocrinologist in the meantime. I called the referring doctor's office and spoke with Butch Penny at [facility] to update on the status of the referral.

## 2013-12-13 ENCOUNTER — Ambulatory Visit: Payer: BC Managed Care – PPO | Admitting: Internal Medicine

## 2013-12-18 ENCOUNTER — Other Ambulatory Visit: Payer: Self-pay | Admitting: Physician Assistant

## 2013-12-20 ENCOUNTER — Telehealth: Payer: Self-pay

## 2013-12-20 NOTE — Telephone Encounter (Signed)
Pt called back, verbalized understanding and agreed to not take the xanax and valium together.

## 2013-12-20 NOTE — Telephone Encounter (Signed)
Faxed

## 2013-12-20 NOTE — Telephone Encounter (Signed)
[facility] called to make sure [name] is aware that pt was given a Rx for xanax 1 mg tablets to take QID by Dr Toy Care and Domingo Mend has Rxd the Valium for pt today. Pharm stated that there is some overlap of Rxs. [name] OK'd fill of Valium WITH stipulation that pt be called and advised that she is NOT to take these medications together. She may take one or the other, but not both. I called pt and LMOM that it is very imp that she call me back concerning her Rx.

## 2013-12-22 ENCOUNTER — Encounter: Payer: Self-pay | Admitting: Physician Assistant

## 2013-12-27 ENCOUNTER — Encounter: Payer: Self-pay | Admitting: Physician Assistant

## 2013-12-29 ENCOUNTER — Encounter: Payer: Self-pay | Admitting: Physician Assistant

## 2013-12-30 ENCOUNTER — Ambulatory Visit: Payer: BC Managed Care – PPO | Admitting: Internal Medicine

## 2013-12-30 MED ORDER — HYDROCODONE-ACETAMINOPHEN 10-325 MG PO TABS: 1.0000 | ORAL_TABLET | Freq: Three times a day (TID) | ORAL | Status: DC | PRN

## 2013-12-30 NOTE — Telephone Encounter (Signed)
Rx printed. Patient notified via My Chart.

## 2013-12-30 NOTE — Telephone Encounter (Signed)
Pt p/up and signed.

## 2014-01-12 ENCOUNTER — Encounter: Payer: Self-pay | Admitting: Gynecology

## 2014-01-12 ENCOUNTER — Ambulatory Visit (INDEPENDENT_AMBULATORY_CARE_PROVIDER_SITE_OTHER): Payer: BC Managed Care – PPO | Admitting: Gynecology

## 2014-01-12 ENCOUNTER — Other Ambulatory Visit: Payer: Self-pay | Admitting: Physician Assistant

## 2014-01-12 VITALS — BP 102/66 | HR 96 | Temp 98.6°F | Ht 68.5 in | Wt 178.0 lb

## 2014-01-12 DIAGNOSIS — E039 Hypothyroidism, unspecified: Secondary | ICD-10-CM

## 2014-01-12 DIAGNOSIS — N920 Excessive and frequent menstruation with regular cycle: Secondary | ICD-10-CM

## 2014-01-12 DIAGNOSIS — N946 Dysmenorrhea, unspecified: Secondary | ICD-10-CM

## 2014-01-12 DIAGNOSIS — G43009 Migraine without aura, not intractable, without status migrainosus: Secondary | ICD-10-CM | POA: Insufficient documentation

## 2014-01-12 DIAGNOSIS — N943 Premenstrual tension syndrome: Secondary | ICD-10-CM

## 2014-01-12 MED ORDER — ETONOGESTREL-ETHINYL ESTRADIOL 0.12-0.015 MG/24HR VA RING: VAGINAL_RING | VAGINAL | Status: DC

## 2014-01-12 NOTE — Progress Notes (Signed)
Patient ID: Barbara Rogers, female   DOB: 11-10-1980, 33 y.o.   MRN: [id number] 33 y.o. Single/significant other Caucasian female   [id number] here for problem visit Pt is currently sexually active.  Pt has a histroy of severe PMDD and dysmenorrhea since menarche.  Pt has been seeing a psychiatrist for PMDD for 3y-Dr Robina Ade- and has been on multiple medications and is now on  is now on Saphris which is an antipsychotic for the past 23m and seems to be working.  She is still having dysmenorrhea but has not been having the emotional liability or anxiety.  Pt had tried mirena IUD, and ocp without success.  She also tried camilla.  Pt has had a lot of stress with illness of her grandparent who she takes care of.  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    Patient's last menstrual period was 01/04/2014.          Sexually active: yes  The current method of family planning is condoms never and IUD.    Exercising: no  The patient has a physically strenuous job, but has no regular exercise apart from work.  Pt has to walk 1 mile to get from car to job and physically active at work. Last pap:  06/22/13 Alcohol:  none Tobacco:  1 PPD BSE:  monthly    Health Maintenance  Topic Date Due  . Influenza Vaccine  04/09/2014  . Pap Smear  06/22/2016  . Tetanus/tdap  09/09/2018    Family History  Problem Relation Age of Onset  . Migraines Mother   . Mental illness Mother   . Heart disease Father   . Alcohol abuse Father   . Arthritis Maternal Grandmother   . Thyroid disease Maternal Grandmother   . Heart disease Maternal Grandfather     AMI 1996, 2014  . Anemia Maternal Grandfather     bone marrow dysfunction    Patient Active Problem List   Diagnosis Date Noted  . IUD (intrauterine device) in place 06/02/2013  . Hypothyroid 12/09/2012  . Finger fracture, left   . Chronic left shoulder pain   . Mood disorder 03/25/2012  . AR (allergic rhinitis) 03/25/2012  .  Dysmenorrhea 03/25/2012  . PMDD (premenstrual dysphoric disorder) 03/25/2012  . Chronic insomnia 03/25/2012  . Migraine     Past Medical History  Diagnosis Date  . Allergy   . Migraine   . Anxiety   . Depression   . Dysmenorrhea   . PMDD (premenstrual dysphoric disorder)   . Insomnia   . Nephrolithiasis   . Finger fracture, left 10/31/2012    LEFT 4th finger  . Chronic left shoulder pain 08/2012    History reviewed. No pertinent past surgical history.  Allergies: Penicillins and Progestins  Current Outpatient Prescriptions  Medication Sig Dispense Refill  . albuterol (PROVENTIL HFA;VENTOLIN HFA) 108 (90 BASE) MCG/ACT inhaler Inhale 2 puffs into the lungs every 4 (four) hours as needed for wheezing (cough, shortness of breath or wheezing.).  1 Inhaler  1  . ALPRAZolam (XANAX) 1 MG tablet Take 1-2 mg by mouth at bedtime as needed for sleep.       Marland Kitchen amphetamine-dextroamphetamine (ADDERALL) 20 MG tablet Take 20 mg by mouth 3 (three) times daily.      . Asenapine Maleate (SAPHRIS) 10 MG SUBL Place 10 mg under the tongue.      . benzonatate (TESSALON) 100 MG capsule Take 1-2 capsules (100-200 mg total) by mouth 3 (three)  times daily as needed for cough.  40 capsule  0  . diazepam (VALIUM) 10 MG tablet TAKE 1/2 TO 1 TABLET BY MOUTH EVERY 8 HOURS AS NEEDED FOR ANXIETY  20 tablet  0  . HYDROcodone-acetaminophen (NORCO) 10-325 MG per tablet Take 1 tablet by mouth every 8 (eight) hours as needed for moderate pain or severe pain.  90 tablet  0  . ipratropium (ATROVENT) 0.03 % nasal spray Place 2 sprays into both nostrils 2 (two) times daily.  30 mL  0  . IUD's (PARAGARD INTRAUTERINE COPPER IU) 1 Units by Intrauterine route.      Marland Kitchen levothyroxine (SYNTHROID, LEVOTHROID) 50 MCG tablet Take 1 tablet (50 mcg total) by mouth daily.  90 tablet  3  . lidocaine (LIDODERM) 5 % Place 2 patches onto the skin daily. Remove & Discard patch within 12 hours or as directed by MD  180 patch  0  .  methocarbamol (ROBAXIN) 500 MG tablet Take 1 tablet (500 mg total) by mouth every 6 (six) hours as needed for muscle spasms.  120 tablet  3  . mirtazapine (REMERON) 15 MG tablet Take 15 mg by mouth at bedtime.      . mometasone (NASONEX) 50 MCG/ACT nasal spray 2 sprays in each nostril each day  17 g  12  . Multiple Vitamins-Minerals (MULTIVITAMINS THER. W/MINERALS) TABS Take 1 tablet by mouth daily.       . phentermine (ADIPEX-P) 37.5 MG tablet Take 1 tablet by mouth daily.      . pregabalin (LYRICA) 200 MG capsule Take 1 capsule (200 mg total) by mouth 3 (three) times daily.  270 capsule  0  . zolpidem (AMBIEN) 10 MG tablet Take 10 mg by mouth at bedtime as needed for sleep.        No current facility-administered medications for this visit.    ROS: Pertinent items are noted in HPI.  Exam:    BP 102/66  Pulse 96  Temp(Src) 98.6 F (37 C) (Oral)  Ht 5' 8.5" (1.74 m)  Wt 178 lb (80.74 kg)  BMI 26.67 kg/m2  LMP 01/04/2014 Weight change: @WEIGHTCHANGE @ Last 3 height recordings:  Ht Readings from Last 3 Encounters:  01/12/14 5' 8.5" (1.74 m)  11/03/13 5' 7.25" (1.708 m)  10/18/13 5\' 9"  (1.753 m)   General appearance: alert, cooperative and appears stated age Lungs: clear to auscultation bilaterally Heart: regular rate and rhythm, S1, S2 normal, no murmur, click, rub or gallop Abdomen: soft, non-tender; bowel sounds normal; no masses,  no organomegaly Extremities: extremities normal, atraumatic, no cyanosis or edema Skin: Skin color, texture, turgor normal. No rashes or lesions Lymph nodes: Cervical, supraclavicular, and axillary nodes normal. no inguinal nodes palpated Neurologic: Grossly normal   Pelvic: External genitalia:  no lesions              Urethra: normal appearing urethra with no masses, tenderness or lesions              Bartholins and Skenes: Bartholin's, Urethra, Skene's normal                 Vagina: normal appearing vagina with normal color and discharge, no  lesions              Cervix: normal appearance and IUD string visualized              Pap taken: no        Bimanual Exam:  Uterus:  uterus is normal size,  shape, consistency and nontender                                      Adnexa:    no masses                                      Rectovaginal: Confirms, no posterior uterine nodularity or sidewall tenderness                                      Anus:  normal sphincter tone, no lesions  A: primary dysmenorrhea PMDD-severe, under psychiatric care smoker   Hypothyroid-poorly controled  P: pt has tried multiple medications to control her dysmenorrhea and PMDD.  He symptoms are worse at the time of her menses.  I explained to pt that removing her uterus and ovaries would not relieve her symptoms and may make them worse by creating early menopause.  The mirena would control her dysmenorrhea but not address her PMDD.  SHe appears to be doing well on her current regimen and I suggest trying to suppress the ovaries again.  Although she failed nuvaring in the past, I think that the stready state of hormones would be the most stabilizing for her.  I suggest she stay on continuously without a break as she should not have a build up of lining.  She will place the first day of her cycle due 5/28 and will then exchange the first of each month.  She is agreeable and will f/u in 36m.  She may contact the office if she has any issues, she is ware that BTB is common when starting ocp this way.  I informed pts of cardiovascular  risks associated with smoking and estrogen containing ocp.  I suggest she try to quit smoking. We reviewed her TSH results in the computer.  They are markedly fluxuant despite compliance with synthroid.  Affects of poorly controled thyroid and menstrual changes reviewed.  We will do TFT's and antibodies.  She has an appt with endocrine and I encourage her to keep-she missed her last appt due to illness in her family. F/u with PCP regarding other  medical issues F/u with psych F/u 56m Questions addressed. Over 58m spent direct pt counseling, >50% face to face    An After Visit Summary was printed and given to the patient.

## 2014-01-13 ENCOUNTER — Telehealth: Payer: Self-pay | Admitting: Gynecology

## 2014-01-13 LAB — THYROID PANEL WITH TSH
Free Thyroxine Index: 3.1 (ref 1.0–3.9)
T3 UPTAKE: 39.1 % — AB (ref 22.5–37.0)
T4 TOTAL: 8 ug/dL (ref 5.0–12.5)
TSH: 0.725 u[IU]/mL (ref 0.350–4.500)

## 2014-01-13 LAB — THYROID ANTIBODIES
Thyroglobulin Ab: 24.8 IU/mL (ref <40.0)
Thyroperoxidase Ab SerPl-aCnc: 10.4 IU/mL (ref <35.0)

## 2014-01-13 NOTE — Telephone Encounter (Signed)
Advised patient that she is scheduled with Dr. Chalmers Cater 06.24.2015 @0930 . Patient agreeable.

## 2014-01-14 NOTE — Telephone Encounter (Signed)
[place] [facility] faxed.

## 2014-01-17 ENCOUNTER — Telehealth: Payer: Self-pay | Admitting: Orthopedic Surgery

## 2014-01-17 NOTE — Telephone Encounter (Signed)
LMTCB for result  ?

## 2014-01-25 ENCOUNTER — Encounter: Payer: Self-pay | Admitting: Physician Assistant

## 2014-01-25 DIAGNOSIS — G8929 Other chronic pain: Secondary | ICD-10-CM

## 2014-01-25 DIAGNOSIS — N946 Dysmenorrhea, unspecified: Secondary | ICD-10-CM

## 2014-01-25 DIAGNOSIS — M25512 Pain in left shoulder: Principal | ICD-10-CM

## 2014-01-25 MED ORDER — PREGABALIN 200 MG PO CAPS: 200.0000 mg | ORAL_CAPSULE | Freq: Three times a day (TID) | ORAL | Status: DC

## 2014-01-25 MED ORDER — HYDROCODONE-ACETAMINOPHEN 10-325 MG PO TABS: 1.0000 | ORAL_TABLET | Freq: Three times a day (TID) | ORAL | Status: DC | PRN

## 2014-01-25 MED ORDER — LIDOCAINE 5 % EX PTCH: 2.0000 | MEDICATED_PATCH | CUTANEOUS | Status: DC

## 2014-01-25 NOTE — Telephone Encounter (Signed)
Patient aware by My Chart.  Meds ordered this encounter  Medications  . HYDROcodone-acetaminophen (NORCO) 10-325 MG per tablet    Sig: Take 1 tablet by mouth every 8 (eight) hours as needed for moderate pain or severe pain.    Dispense:  90 tablet    Refill:  0    Order Specific Question:  Supervising Provider    Answer:  DOOLITTLE, ROBERT P [[id number]]  . lidocaine (LIDODERM) 5 %    Sig: Place 2 patches onto the skin daily. Remove & Discard patch within 12 hours or as directed by MD    Dispense:  180 patch    Refill:  0    Order Specific Question:  Supervising Provider    Answer:  DOOLITTLE, ROBERT P [[id number]]  . pregabalin (LYRICA) 200 MG capsule    Sig: Take 1 capsule (200 mg total) by mouth 3 (three) times daily.    Dispense:  270 capsule    Refill:  0    Order Specific Question:  Supervising Provider    Answer:  DOOLITTLE, ROBERT P [[id number]]

## 2014-02-04 ENCOUNTER — Encounter: Payer: Self-pay | Admitting: Physician Assistant

## 2014-02-10 ENCOUNTER — Ambulatory Visit (INDEPENDENT_AMBULATORY_CARE_PROVIDER_SITE_OTHER): Payer: BC Managed Care – PPO | Admitting: Physician Assistant

## 2014-02-10 VITALS — BP 102/60 | HR 76 | Temp 97.7°F | Ht 67.5 in | Wt 177.0 lb

## 2014-02-10 DIAGNOSIS — M7020 Olecranon bursitis, unspecified elbow: Secondary | ICD-10-CM

## 2014-02-10 DIAGNOSIS — M7022 Olecranon bursitis, left elbow: Secondary | ICD-10-CM

## 2014-02-10 NOTE — Progress Notes (Signed)
Subjective:    Patient ID: Barbara Rogers, female    DOB: [birth year], 33 y.o.   MRN: [id number]   PCP: Lory Nowaczyk,Tachina Spoonemore, PA-C  Chief Complaint  Patient presents with  . swelling in left elbow x1 week    Medications, allergies, past medical history, surgical history, family history, social history and problem list reviewed and updated.  Patient Active Problem List   Diagnosis Date Noted  . Migraine without aura   . IUD (intrauterine device) in place 06/02/2013  . Hypothyroid 12/09/2012  . Finger fracture, left   . Chronic left shoulder pain   . Mood disorder 03/25/2012  . AR (allergic rhinitis) 03/25/2012  . Dysmenorrhea 03/25/2012  . PMDD (premenstrual dysphoric disorder) 03/25/2012  . Chronic insomnia 03/25/2012  . Migraine     Prior to Admission medications   Medication Sig Start Date End Date Taking? Authorizing Provider  albuterol (PROVENTIL HFA;VENTOLIN HFA) 108 (90 BASE) MCG/ACT inhaler Inhale 2 puffs into the lungs every 4 (four) hours as needed for wheezing (cough, shortness of breath or wheezing.). 11/03/13 03/18/15 Yes Ravis Herne S Emmauel Hallums, PA-C  ALPRAZolam (XANAX) 1 MG tablet Take 1-2 mg by mouth at bedtime as needed for sleep.    Yes Historical Provider, MD  amphetamine-dextroamphetamine (ADDERALL) 20 MG tablet Take 20 mg by mouth 3 (three) times daily.   Yes Historical Provider, MD  Asenapine Maleate (SAPHRIS) 10 MG SUBL Place 10 mg under the tongue 2 (two) times daily.    Yes Historical Provider, MD  diazepam (VALIUM) 10 MG tablet TAKE ONE-HALF TO ONE TABLET BY MOUTH EVERY 8 HOURS AS NEEDED FOR ANXIETY   Yes Fara Chute, PA-C  etonogestrel-ethinyl estradiol (NUVARING) 0.12-0.015 MG/24HR vaginal ring Place first day of cycle, exchange monthly 01/12/14  Yes Azalia Bilis, MD  FIBER DIET PO Take by mouth.   Yes Historical Provider, MD  HYDROcodone-acetaminophen (NORCO) 10-325 MG per tablet Take 1 tablet by mouth every 8 (eight) hours as needed for moderate pain or  severe pain. 01/25/14  Yes Aryahna Spagna S Jameca Chumley, PA-C  ipratropium (ATROVENT) 0.03 % nasal spray Place 2 sprays into both nostrils 2 (two) times daily. 11/25/13  Yes Stevens Magwood S Dianelys Scinto, PA-C  IUD's (PARAGARD INTRAUTERINE COPPER IU) 1 Units by Intrauterine route.   Yes Historical Provider, MD  levothyroxine (SYNTHROID, LEVOTHROID) 50 MCG tablet Take 1 tablet (50 mcg total) by mouth daily. 08/18/13  Yes Lewellyn Fultz S Caedence Snowden, PA-C  lidocaine (LIDODERM) 5 % Place 2 patches onto the skin daily. Remove & Discard patch within 12 hours or as directed by MD 01/25/14  Yes Ralene Gasparyan S Kyndall Chaplin, PA-C  methocarbamol (ROBAXIN) 500 MG tablet Take 1 tablet (500 mg total) by mouth every 6 (six) hours as needed for muscle spasms. 08/18/13  Yes Myrtle Haller S Akaash Vandewater, PA-C  mirtazapine (REMERON) 15 MG tablet Take 15 mg by mouth at bedtime.   Yes Historical Provider, MD  mometasone (NASONEX) 50 MCG/ACT nasal spray 2 sprays in each nostril each day 11/03/13  Yes Yatziry Deakins S Winfield Caba, PA-C  Multiple Vitamins-Minerals (MULTIVITAMINS THER. W/MINERALS) TABS Take 1 tablet by mouth daily.    Yes Historical Provider, MD  phentermine (ADIPEX-P) 37.5 MG tablet Take 1 tablet by mouth daily. 01/04/14  Yes Historical Provider, MD  pregabalin (LYRICA) 200 MG capsule Take 1 capsule (200 mg total) by mouth 3 (three) times daily. 01/25/14  Yes  S , PA-C  zolpidem (AMBIEN) 10 MG tablet Take 10 mg by mouth at bedtime as needed for sleep.  Yes Historical Provider, MD    HPI  Presents with a swollen lump of the LEFT elbow. Present x several weeks. Was much worse, and very painful, so scheduled with her orthopedist.  But, since she's missed work due to it, she comes in tonight. Has an appointment with Dr. Lynann Bologna next week. Is some better, and smaller, but persists. Worsens when she's working, and then improves when she has a couple of days off.  LEFT hand dominant.  Also has LEFT shoulder pain and neck pain. Wakes frequently during the night with her  arms curled up underneath her body, and numbness in her hands.  Getting ready to start NuvaRing, to use continuously to avoid severe dysmenorrhea.  IUD remains in place.  Review of Systems As above.    Objective:   Physical Exam  Constitutional: She is oriented to person, place, and time. She appears well-developed and well-nourished. No distress.  BP 102/60  Pulse 76  Temp(Src) 97.7 F (36.5 C) (Oral)  Ht 5' 7.5" (1.715 m)  Wt 177 lb (80.287 kg)  BMI 27.30 kg/m2  SpO2 96%  LMP 01/04/2014   Eyes: Conjunctivae are normal. No scleral icterus.  Neck: No thyromegaly present.  Cardiovascular: Normal rate.   Pulmonary/Chest: Effort normal.  Musculoskeletal:       Left elbow: She exhibits decreased range of motion and swelling (olecranon bursitis). She exhibits no effusion, no deformity and no laceration. Tenderness found. Olecranon process tenderness noted. No radial head, no medial epicondyle and no lateral epicondyle tenderness noted.       Left wrist: Normal.       Left upper arm: Normal.       Left forearm: Normal.       Left hand: Normal.  Lymphadenopathy:    She has no cervical adenopathy.  Neurological: She is alert and oriented to person, place, and time. She has normal strength. No sensory deficit.  Skin: Skin is warm and dry.  Psychiatric: She has a normal mood and affect. Her behavior is normal.          Assessment & Plan:  1. Olecranon bursitis of left elbow ACE wrap placed for compression.  She has NSAIDS at home.  Proceed with follow up with Dr. Lynann Bologna as planned.   Fara Chute, PA-C Physician Assistant-Certified [facility]

## 2014-02-10 NOTE — Patient Instructions (Signed)
Proceed to the appointment with Dr. Lynann Bologna.

## 2014-02-12 ENCOUNTER — Other Ambulatory Visit: Payer: Self-pay | Admitting: Physician Assistant

## 2014-02-14 ENCOUNTER — Encounter: Payer: Self-pay | Admitting: Physician Assistant

## 2014-02-14 NOTE — Telephone Encounter (Signed)
Please call patient and advise her that [name] is out of the office this week. It does not appear that [name] was following the patient for this. It looks like her GYN. This note should come from GYN, why is she asking [place]?

## 2014-02-15 ENCOUNTER — Telehealth: Payer: Self-pay | Admitting: Physician Assistant

## 2014-02-15 ENCOUNTER — Other Ambulatory Visit: Payer: Self-pay | Admitting: Physician Assistant

## 2014-02-15 NOTE — Telephone Encounter (Signed)
Please see my documentation in patient's My Chart message and get info. Patient of [name]'s is requesting note for work secondary to GYN however it look like GYN has been seeing her for this. [name] last saw patient for her elbow.

## 2014-02-15 NOTE — Telephone Encounter (Signed)
See MyChart message. Sent one to the pt requesting more info about whether she has been seeing GYN

## 2014-02-17 NOTE — Telephone Encounter (Signed)
Faxed

## 2014-02-25 ENCOUNTER — Telehealth: Payer: Self-pay | Admitting: Gynecology

## 2014-02-25 NOTE — Telephone Encounter (Signed)
Call to patient regarding heavy bleeding.  Advised we are unable to write her a note out of work without seeing her. She states she is happy to come in on Monday but will need note out of work for three days backdated to today to protect her job. Due at work today at 545. Advised that if she feels she is unstable enough to wait till Monday and requires note out of work, he will need to go to urgent care or [facility] at [facility] Asked patient how heavy her bleeding is and she report she is changing pad three times daily, thick maxi pad. Advised BTB is common when restarting hormone regimen.  She states this is heavier that BTB, advised BTB is just referring to bleeding at time other than expected time and not reflective of how heavy her bleeding is.  States we have been treating her for this and we are aware that progesterone causes her to feel bad and be aggressive and she does not see why we cant just write her a note for a problem that she is having. She states this makes her emotional and patient is talking loud and fast, becoming more frustrated.  She cant believe "I have to go to urgent care for my period" States she will go there and she will not be returning to our office because we are not taking care of her. Apologized that she feels we are meeting her needs, call came in today at 255 and we are unable to see her this late. Recommend urgent care or ED if cant wait till Monday or if needs note for today. Patient states very loudly she will not be coming back and hung up.    Routing to provider for final review. Patient agreeable to disposition. Will close encounter

## 2014-02-25 NOTE — Telephone Encounter (Signed)
Spoke with patient at time of incoming call. Patient came in to see Dr.Lathrop on 5/6. Patient states she was to start using the nuvaring with her cycle for 30 days. Patient has had the nuvaring in for 12 days and is having "increased bleeding." Patient states that she last changed her pad/tampon this morning when she woke up. " When I got up there was blood running down my legs to my feet." Patient is having "Intense cramps." Patient takes vicodin for cramping. Pain is a 7/1. Last took Vicodin at 8am this morning. Patient would like to know what Dr.Lathrop recommends that she do. Advised would speak with provider and give patient a call back with further instructions and recommendations. Patient is available to come in today if she needs to be seen.

## 2014-02-25 NOTE — Telephone Encounter (Signed)
Spoke with patient. Advised patient that per Dr.Lathrop bleeding is to be expected since nuvaring was inserted on first day of cycle. This will lengthen cycle due to increased breakdown of her lining. Patient states "I am willing to weather the storm but I am in pain with my cramping and bleeding and I need a note to write me out of work. I have short term disability but will need a note and has to be out for three days with it. I need to know today if this can happen." Advised would speak with Dr.Lathrop regarding request and give patient a call back with further instructions. Patient agreeable.

## 2014-02-25 NOTE — Telephone Encounter (Signed)
Pt has been bleeding for 12 days while using Nuvring

## 2014-02-26 ENCOUNTER — Encounter: Payer: Self-pay | Admitting: Physician Assistant

## 2014-02-28 ENCOUNTER — Encounter: Payer: Self-pay | Admitting: Physician Assistant

## 2014-02-28 MED ORDER — HYDROCODONE-ACETAMINOPHEN 10-325 MG PO TABS: 1.0000 | ORAL_TABLET | Freq: Three times a day (TID) | ORAL | Status: DC | PRN

## 2014-02-28 NOTE — Telephone Encounter (Signed)
Patient notified via My Chart.  Meds ordered this encounter  Medications  . HYDROcodone-acetaminophen (NORCO) 10-325 MG per tablet    Sig: Take 1 tablet by mouth every 8 (eight) hours as needed for moderate pain or severe pain.    Dispense:  90 tablet    Refill:  0    Order Specific Question:  Supervising Mikael Debell    Answer:  DOOLITTLE, ROBERT P [[id number]]    

## 2014-03-03 ENCOUNTER — Encounter: Payer: Self-pay | Admitting: Physician Assistant

## 2014-03-03 ENCOUNTER — Ambulatory Visit (INDEPENDENT_AMBULATORY_CARE_PROVIDER_SITE_OTHER): Payer: BC Managed Care – PPO | Admitting: Physician Assistant

## 2014-03-03 VITALS — BP 112/68 | HR 100 | Temp 98.7°F | Resp 18 | Ht 67.75 in | Wt 174.0 lb

## 2014-03-03 DIAGNOSIS — N920 Excessive and frequent menstruation with regular cycle: Secondary | ICD-10-CM

## 2014-03-03 LAB — POCT URINE PREGNANCY: Preg Test, Ur: NEGATIVE

## 2014-03-03 NOTE — Progress Notes (Signed)
Subjective:    Patient ID: Barbara Rogers, female    DOB: 10/29/1980, 33 y.o.   MRN: [id number]   PCP: JEFFERY,[name], PA-C  Chief Complaint  Patient presents with  . Vaginal Bleeding    heavy bleeding for 18 days since June 8, cramping, no n/v, no fever, is on NuvaRing    Medications, allergies, past medical history, surgical history, family history, social history and problem list reviewed and updated.  Patient Active Problem List   Diagnosis Date Noted  . Migraine without aura   . IUD (intrauterine device) in place 06/02/2013  . Hypothyroid 12/09/2012  . Finger fracture, left   . Chronic left shoulder pain   . Mood disorder 03/25/2012  . AR (allergic rhinitis) 03/25/2012  . Dysmenorrhea 03/25/2012  . PMDD (premenstrual dysphoric disorder) 03/25/2012  . Chronic insomnia 03/25/2012  . Migraine     Prior to Admission medications   Medication Sig Start Date End Date Taking? Authorizing Provider  albuterol (PROVENTIL HFA;VENTOLIN HFA) 108 (90 BASE) MCG/ACT inhaler Inhale 2 puffs into the lungs every 4 (four) hours as needed for wheezing (cough, shortness of breath or wheezing.). 11/03/13 03/18/15 Yes [name] S Jeffery, PA-C  ALPRAZolam (XANAX) 1 MG tablet Take 1-2 mg by mouth at bedtime as needed for sleep.    Yes Historical Provider, MD  amphetamine-dextroamphetamine (ADDERALL) 20 MG tablet Take 20 mg by mouth 3 (three) times daily.   Yes Historical Provider, MD  Asenapine Maleate (SAPHRIS) 10 MG SUBL Place 10 mg under the tongue 2 (two) times daily.    Yes Historical Provider, MD  diazepam (VALIUM) 10 MG tablet TAKE 1/2 TO 1 TABLET BY MOUTH EVERY 8 HOURS   Yes Ryan M Dunn, PA-C  etonogestrel-ethinyl estradiol (NUVARING) 0.12-0.015 MG/24HR vaginal ring Place first day of cycle, exchange monthly 01/12/14  Yes Azalia Bilis, MD  FIBER DIET PO Take by mouth.   Yes Historical Provider, MD  HYDROcodone-acetaminophen (NORCO) 10-325 MG per tablet Take 1 tablet by mouth every 8 (eight)  hours as needed for moderate pain or severe pain. 02/28/14  Yes [name] S Jeffery, PA-C  ipratropium (ATROVENT) 0.03 % nasal spray Place 2 sprays into both nostrils 2 (two) times daily. 11/25/13  Yes [name] S Jeffery, PA-C  IUD's (PARAGARD INTRAUTERINE COPPER IU) 1 Units by Intrauterine route.   Yes Historical Provider, MD  levothyroxine (SYNTHROID, LEVOTHROID) 50 MCG tablet Take 1 tablet (50 mcg total) by mouth daily. 08/18/13  Yes [name] S Jeffery, PA-C  lidocaine (LIDODERM) 5 % Place 2 patches onto the skin daily. Remove & Discard patch within 12 hours or as directed by MD 01/25/14  Yes [name] S Jeffery, PA-C  methocarbamol (ROBAXIN) 500 MG tablet Take 1 tablet (500 mg total) by mouth every 6 (six) hours as needed for muscle spasms. 08/18/13  Yes [name] S Jeffery, PA-C  mirtazapine (REMERON) 15 MG tablet Take 15 mg by mouth at bedtime.   Yes Historical Provider, MD  mometasone (NASONEX) 50 MCG/ACT nasal spray 2 sprays in each nostril each day 11/03/13  Yes [name] S Jeffery, PA-C  Multiple Vitamins-Minerals (MULTIVITAMINS THER. W/MINERALS) TABS Take 1 tablet by mouth daily.    Yes Historical Provider, MD  phentermine (ADIPEX-P) 37.5 MG tablet Take 1 tablet by mouth daily. 01/04/14  Yes Historical Provider, MD  pregabalin (LYRICA) 200 MG capsule Take 1 capsule (200 mg total) by mouth 3 (three) times daily. 01/25/14  Yes [name] S Jeffery, PA-C  zolpidem (AMBIEN) 10 MG tablet Take 10 mg  by mouth at bedtime as needed for sleep.    Yes Historical Provider, MD    HPI  Presents with menstrual bleeding since 6/18, ever since she started NuvaRing. She also has a Paragard IUD.  She contacted her GYN office, and she's going to try to keep it in, but they wouldn't write her a note for work.  She was out of work for another reason (written out by Dr. Lynann Bologna for her elbow) for the first week, but had to take ST disability, and now has to get notes for every time she's out, and has to take 3 days at a time to avoid  accumulating points and risk losing her job.  Seems to be slowing down, but having a lot of cramps.   Review of Systems     Objective:   Physical Exam  Constitutional: She is oriented to person, place, and time. She appears well-developed and well-nourished. She is active and cooperative. No distress.  BP 112/68  Pulse 100  Temp(Src) 98.7 F (37.1 C) (Oral)  Resp 18  Ht 5' 7.75" (1.721 m)  Wt 174 lb (78.926 kg)  BMI 26.65 kg/m2  SpO2 96%   Eyes: Conjunctivae are normal. No scleral icterus.  Neck: Neck supple. No thyromegaly present.  Cardiovascular: Regular rhythm and normal heart sounds.   Pulmonary/Chest: Effort normal and breath sounds normal.  Lymphadenopathy:    She has no cervical adenopathy.  Neurological: She is alert and oriented to person, place, and time.  Skin: Skin is warm and dry.  Psychiatric: She has a normal mood and affect. Her behavior is normal.   Results for orders placed in visit on 03/03/14  POCT URINE PREGNANCY      Result Value Ref Range   Preg Test, Ur Negative            Assessment & Plan:  1. Menorrhagia with regular cycle Continue NuvaRing for now. Await CBC results. If she's still bleeding in 2 weeks, RTC for repeat CBC, and would likely recommend d/c NuvaRing. - POCT urine pregnancy - CBC with Differential   Fara Chute, PA-C Physician Assistant-Certified [facility] [place]

## 2014-03-04 LAB — CBC WITH DIFFERENTIAL/PLATELET
BASOS PCT: 0 % (ref 0–1)
Basophils Absolute: 0 10*3/uL (ref 0.0–0.1)
EOS ABS: 0.3 10*3/uL (ref 0.0–0.7)
Eosinophils Relative: 3 % (ref 0–5)
HCT: 39.1 % (ref 36.0–46.0)
Hemoglobin: 13.1 g/dL (ref 12.0–15.0)
Lymphocytes Relative: 34 % (ref 12–46)
Lymphs Abs: 3.5 10*3/uL (ref 0.7–4.0)
MCH: 29.2 pg (ref 26.0–34.0)
MCHC: 33.5 g/dL (ref 30.0–36.0)
MCV: 87.3 fL (ref 78.0–100.0)
MONOS PCT: 6 % (ref 3–12)
Monocytes Absolute: 0.6 10*3/uL (ref 0.1–1.0)
Neutro Abs: 5.8 10*3/uL (ref 1.7–7.7)
Neutrophils Relative %: 57 % (ref 43–77)
PLATELETS: 207 10*3/uL (ref 150–400)
RBC: 4.48 MIL/uL (ref 3.87–5.11)
RDW: 14.9 % (ref 11.5–15.5)
WBC: 10.2 10*3/uL (ref 4.0–10.5)

## 2014-03-18 ENCOUNTER — Ambulatory Visit: Payer: BC Managed Care – PPO | Admitting: Gynecology

## 2014-03-22 ENCOUNTER — Other Ambulatory Visit: Payer: Self-pay | Admitting: Physician Assistant

## 2014-03-23 NOTE — Telephone Encounter (Signed)
Faxed

## 2014-03-27 ENCOUNTER — Encounter: Payer: Self-pay | Admitting: Physician Assistant

## 2014-03-28 MED ORDER — HYDROCODONE-ACETAMINOPHEN 10-325 MG PO TABS: 1.0000 | ORAL_TABLET | Freq: Three times a day (TID) | ORAL | Status: DC | PRN

## 2014-03-28 NOTE — Telephone Encounter (Signed)
Faxed in

## 2014-03-28 NOTE — Telephone Encounter (Signed)
Patient notified via My Chart. Rx printed.

## 2014-03-28 NOTE — Telephone Encounter (Signed)
Correction. Rx not faxed, in drawer for p/up.

## 2014-04-20 ENCOUNTER — Other Ambulatory Visit: Payer: Self-pay | Admitting: Physician Assistant

## 2014-04-25 ENCOUNTER — Encounter: Payer: Self-pay | Admitting: Physician Assistant

## 2014-04-27 ENCOUNTER — Telehealth: Payer: Self-pay

## 2014-04-27 ENCOUNTER — Encounter: Payer: Self-pay | Admitting: Physician Assistant

## 2014-04-27 MED ORDER — HYDROCODONE-ACETAMINOPHEN 10-325 MG PO TABS: 1.0000 | ORAL_TABLET | Freq: Three times a day (TID) | ORAL | Status: DC | PRN

## 2014-04-27 NOTE — Telephone Encounter (Signed)
Patient notified via My Chart.  Meds ordered this encounter  Medications  . HYDROcodone-acetaminophen (NORCO) 10-325 MG per tablet    Sig: Take 1 tablet by mouth every 8 (eight) hours as needed for moderate pain or severe pain.    Dispense:  90 tablet    Refill:  0    Order Specific Question:  Supervising Provider    Answer:  DOOLITTLE, ROBERT P [[id number]]    

## 2014-04-27 NOTE — Telephone Encounter (Signed)
Done, and patient notified by My Chart.

## 2014-04-27 NOTE — Telephone Encounter (Signed)
Refill on Norco

## 2014-05-06 ENCOUNTER — Other Ambulatory Visit: Payer: Self-pay | Admitting: Physician Assistant

## 2014-05-09 MED ORDER — DIAZEPAM 10 MG PO TABS: ORAL_TABLET | ORAL | Status: DC

## 2014-05-10 ENCOUNTER — Encounter: Payer: Self-pay | Admitting: Physician Assistant

## 2014-05-10 NOTE — Telephone Encounter (Signed)
Faxed. Notified pt in [place].

## 2014-05-11 ENCOUNTER — Telehealth: Payer: Self-pay

## 2014-05-11 NOTE — Telephone Encounter (Signed)
I prescribed Valium (diazepam) for chronic shoulder pain. I was not aware that Dr. Toy Care was prescribing alprazolam.  OK to CANCEL the Rx for diazepam.

## 2014-05-11 NOTE — Telephone Encounter (Signed)
Rx has been voided with [facility] Bal at [facility]

## 2014-05-11 NOTE — Telephone Encounter (Signed)
[name], [facility] called and has a concern about Rx for diazepam. Pharmacist stated that pt gets #120 tablets of alprazolam every month and also Ambien 10 Qhs from Dr Robina Ade, pt's psychiatrist, and they want to know why you are Rxing this med in addition to these others? Please advise.

## 2014-05-20 ENCOUNTER — Ambulatory Visit: Payer: BC Managed Care – PPO | Admitting: Gynecology

## 2014-05-20 ENCOUNTER — Telehealth: Payer: Self-pay | Admitting: Gynecology

## 2014-05-20 NOTE — Telephone Encounter (Signed)
Patient dnka her 4 mo recheck appointment today. I left patient a message to call and reschedule.

## 2014-05-26 ENCOUNTER — Other Ambulatory Visit: Payer: Self-pay | Admitting: Physician Assistant

## 2014-05-27 ENCOUNTER — Encounter: Payer: Self-pay | Admitting: *Deleted

## 2014-05-27 ENCOUNTER — Other Ambulatory Visit: Payer: Self-pay | Admitting: Physician Assistant

## 2014-05-27 MED ORDER — HYDROCODONE-ACETAMINOPHEN 10-325 MG PO TABS: 1.0000 | ORAL_TABLET | Freq: Three times a day (TID) | ORAL | Status: DC | PRN

## 2014-05-29 NOTE — Telephone Encounter (Signed)
Lyrica faxed

## 2014-06-01 ENCOUNTER — Other Ambulatory Visit: Payer: Self-pay | Admitting: *Deleted

## 2014-06-07 ENCOUNTER — Other Ambulatory Visit: Payer: Self-pay | Admitting: Physician Assistant

## 2014-06-08 ENCOUNTER — Other Ambulatory Visit: Payer: Self-pay | Admitting: Family Medicine

## 2014-06-08 ENCOUNTER — Encounter: Payer: Self-pay | Admitting: Physician Assistant

## 2014-06-08 MED ORDER — ESZOPICLONE 2 MG PO TABS: 2.0000 mg | ORAL_TABLET | Freq: Every evening | ORAL | Status: DC | PRN

## 2014-06-08 NOTE — Telephone Encounter (Signed)
Faxed

## 2014-06-08 NOTE — Telephone Encounter (Signed)
Patient notified via My Chart.  Meds ordered this encounter  Medications  . eszopiclone (LUNESTA) 2 MG TABS tablet    Sig: Take 1 tablet (2 mg total) by mouth at bedtime as needed for sleep. Take immediately before bedtime    Dispense:  7 tablet    Refill:  0    Order Specific Question:  Supervising Provider    Answer:  DOOLITTLE, ROBERT P [[id number]]

## 2014-06-11 ENCOUNTER — Other Ambulatory Visit: Payer: Self-pay | Admitting: Physician Assistant

## 2014-06-11 ENCOUNTER — Telehealth: Payer: Self-pay | Admitting: Physician Assistant

## 2014-06-13 MED ORDER — DIAZEPAM 10 MG PO TABS: ORAL_TABLET | ORAL | Status: DC

## 2014-06-13 NOTE — Telephone Encounter (Signed)
Patient notified via My Chart. She needs an OV.  Meds ordered this encounter  Medications  . diazepam (VALIUM) 10 MG tablet    Sig: TAKE 1/2-1 TABLET BY MOUTH EVERY 8 HOURS    Dispense:  20 tablet    Refill:  0    Not to exceed 5 additional fills before 08/16/2014

## 2014-06-13 NOTE — Addendum Note (Signed)
Addended by: Harrison Mons S on: 06/13/2014 10:00 PM   Modules accepted: Orders

## 2014-06-24 ENCOUNTER — Other Ambulatory Visit: Payer: Self-pay | Admitting: Physician Assistant

## 2014-06-24 MED ORDER — HYDROCODONE-ACETAMINOPHEN 10-325 MG PO TABS: 1.0000 | ORAL_TABLET | Freq: Three times a day (TID) | ORAL | Status: DC | PRN

## 2014-06-24 NOTE — Telephone Encounter (Signed)
Pt notified that this is ready for p/u 

## 2014-07-06 ENCOUNTER — Ambulatory Visit (INDEPENDENT_AMBULATORY_CARE_PROVIDER_SITE_OTHER): Payer: BC Managed Care – PPO | Admitting: Family Medicine

## 2014-07-06 VITALS — BP 112/65 | HR 104 | Temp 99.0°F | Resp 18 | Wt 175.0 lb

## 2014-07-06 DIAGNOSIS — R059 Cough, unspecified: Secondary | ICD-10-CM

## 2014-07-06 DIAGNOSIS — R05 Cough: Secondary | ICD-10-CM

## 2014-07-06 DIAGNOSIS — R590 Localized enlarged lymph nodes: Secondary | ICD-10-CM

## 2014-07-06 DIAGNOSIS — B370 Candidal stomatitis: Secondary | ICD-10-CM

## 2014-07-06 LAB — POCT CBC
Granulocyte percent: 79.8 %G (ref 37–80)
HCT, POC: 40.9 % (ref 37.7–47.9)
Hemoglobin: 13.2 g/dL (ref 12.2–16.2)
Lymph, poc: 2.2 (ref 0.6–3.4)
MCH, POC: 29.3 pg (ref 27–31.2)
MCHC: 32.3 g/dL (ref 31.8–35.4)
MCV: 90.7 fL (ref 80–97)
MID (cbc): 0.4 (ref 0–0.9)
MPV: 8.6 fL (ref 0–99.8)
POC Granulocyte: 10.4 — AB (ref 2–6.9)
POC LYMPH PERCENT: 16.8 %L (ref 10–50)
POC MID %: 3.4 %M (ref 0–12)
Platelet Count, POC: 159 10*3/uL (ref 142–424)
RBC: 4.51 M/uL (ref 4.04–5.48)
RDW, POC: 14.5 %
WBC: 13 10*3/uL — AB (ref 4.6–10.2)

## 2014-07-06 LAB — POCT SEDIMENTATION RATE: POCT SED RATE: 27 mm/hr — AB (ref 0–22)

## 2014-07-06 MED ORDER — FLUCONAZOLE 150 MG PO TABS: 150.0000 mg | ORAL_TABLET | Freq: Once | ORAL | Status: DC

## 2014-07-06 MED ORDER — BENZONATATE 100 MG PO CAPS: 100.0000 mg | ORAL_CAPSULE | Freq: Two times a day (BID) | ORAL | Status: DC | PRN

## 2014-07-06 MED ORDER — FIRST-DUKES MOUTHWASH MT SUSP: 5.0000 mL | OROMUCOSAL | Status: DC | PRN

## 2014-07-06 NOTE — Progress Notes (Signed)
Subjective:    Patient ID: RIO TABER, female    DOB: 06-Oct-1980, 33 y.o.   MRN: [id number] This chart was scribed for Barbara Haber, MD by Barbara Rogers, Medical Scribe. This patient was seen in Room 3 and the patient's care was started at 8:50 AM.  HPI HPI Comments: Barbara Rogers is a 33 y.o. female with a past hx of insomnia, migraine, hypothyroidism and oral thrush who presents to [facility] complaining of cough and cervical adenopathy that started two days ago. Pt states she is having associated mouth sores, sore throat, mouth swelling, swelling in her neck, difficulty swallowing, fatigue and vocal changes. Pt states she thinks she might have thrush. Pt denies cough, SOB, fever or chills. Pt needs a note for work.  Pt works at [employer].   Review of Systems  Constitutional: Positive for appetite change. Negative for fever and chills.  HENT: Positive for mouth sores, sinus pressure and sore throat.   Musculoskeletal: Negative for back pain and myalgias.  Skin: Negative for color change and rash.       Objective:   Physical Exam  Nursing note and vitals reviewed. Constitutional: She is oriented to person, place, and time. She appears well-developed and well-nourished.  Pt looks exhausted.  HENT:  Head: Normocephalic and atraumatic.  Bilateral white patches in mouth, right worse than left. 2/3cm right-sided submandibular node under tongue.  Eyes: Pupils are equal, round, and reactive to light.  Perilimbal injection bilaterally  Neck: No JVD present. No thyromegaly present.  Cardiovascular: Normal rate and regular rhythm.   Pulmonary/Chest: Effort normal and breath sounds normal. No respiratory distress.  Musculoskeletal: She exhibits edema.  Lymphadenopathy:    She has cervical adenopathy.  Neurological: She is alert and oriented to person, place, and time.  Skin: Skin is warm and dry.  Psychiatric: She has a normal mood and affect. Her behavior is normal.   Results for orders  placed in visit on 03/03/14  CBC WITH DIFFERENTIAL      Result Value Ref Range   WBC 10.2  4.0 - 10.5 K/uL   RBC 4.48  3.87 - 5.11 MIL/uL   Hemoglobin 13.1  12.0 - 15.0 g/dL   HCT 39.1  36.0 - 46.0 %   MCV 87.3  78.0 - 100.0 fL   MCH 29.2  26.0 - 34.0 pg   MCHC 33.5  30.0 - 36.0 g/dL   RDW 14.9  11.5 - 15.5 %   Platelets 207  150 - 400 K/uL   Neutrophils Relative % 57  43 - 77 %   Neutro Abs 5.8  1.7 - 7.7 K/uL   Lymphocytes Relative 34  12 - 46 %   Lymphs Abs 3.5  0.7 - 4.0 K/uL   Monocytes Relative 6  3 - 12 %   Monocytes Absolute 0.6  0.1 - 1.0 K/uL   Eosinophils Relative 3  0 - 5 %   Eosinophils Absolute 0.3  0.0 - 0.7 K/uL   Basophils Relative 0  0 - 1 %   Basophils Absolute 0.0  0.0 - 0.1 K/uL   Smear Review Criteria for review not met    POCT URINE PREGNANCY      Result Value Ref Range   Preg Test, Ur Negative     Results for orders placed in visit on 07/06/14  POCT CBC      Result Value Ref Range   WBC 13.0 (*) 4.6 - 10.2 K/uL   Lymph, poc 2.2  0.6 - 3.4   POC LYMPH PERCENT 16.8  10 - 50 %L   MID (cbc) 0.4  0 - 0.9   POC MID % 3.4  0 - 12 %M   POC Granulocyte 10.4 (*) 2 - 6.9   Granulocyte percent 79.8  37 - 80 %G   RBC 4.51  4.04 - 5.48 M/uL   Hemoglobin 13.2  12.2 - 16.2 g/dL   HCT, POC 40.9  37.7 - 47.9 %   MCV 90.7  80 - 97 fL   MCH, POC 29.3  27 - 31.2 pg   MCHC 32.3  31.8 - 35.4 g/dL   RDW, POC 14.5     Platelet Count, POC 159  142 - 424 K/uL   MPV 8.6  0 - 99.8 fL        Assessment & Plan:  Thrush - Plan: Diphenhyd-Hydrocort-Nystatin (FIRST-DUKES MOUTHWASH) SUSP, fluconazole (DIFLUCAN) 150 MG tablet, CANCELED: POCT CBC, CANCELED: POCT SEDIMENTATION RATE  Cough - Plan: benzonatate (TESSALON) 100 MG capsule  Cervical adenopathy - Plan: POCT CBC, POCT SEDIMENTATION RATE  Signed, Barbara Haber, MD

## 2014-07-06 NOTE — Patient Instructions (Signed)
Thrush, Adult  Thrush, also called oral candidiasis, is a fungal infection that develops in the mouth and throat and on the tongue. It causes white patches to form on the mouth and tongue. Thrush is most common in older adults, but it can occur at any age.  Many cases of thrush are mild, but this infection can also be more serious. Thrush can be a recurring problem for people who have chronic illnesses or who take medicines that limit the body's ability to fight infection. Because these people have difficulty fighting infections, the fungus that causes thrush can spread throughout the body. This can cause life-threatening blood or organ infections. CAUSES  Thrush is usually caused by a yeast called Candida albicans. This fungus is normally present in small amounts in the mouth and on other mucous membranes. It usually causes no harm. However, when conditions are present that allow the fungus to grow uncontrolled, it invades surrounding tissues and becomes an infection. Less often, other Candida species can also lead to thrush.  RISK FACTORS Thrush is more likely to develop in the following people:  People with an impaired ability to fight infection (weakened immune system).   Older adults.   People with HIV.   People with diabetes.   People with dry mouth (xerostomia).   Pregnant women.   People with poor dental care, especially those who have false teeth.   People who use antibiotic medicines.  SIGNS AND SYMPTOMS  Thrush can be a mild infection that causes no symptoms. If symptoms develop, they may include:   A burning feeling in the mouth and throat. This can occur at the start of a thrush infection.   White patches that adhere to the mouth and tongue. The tissue around the patches may be red, raw, and painful. If rubbed (during tooth brushing, for example), the patches and the tissue of the mouth may bleed easily.   A bad taste in the mouth or difficulty tasting foods.    Cottony feeling in the mouth.   Pain during eating and swallowing. DIAGNOSIS  Your health care provider can usually diagnose thrush by looking in your mouth and asking you questions about your health.  TREATMENT  Medicines that help prevent the growth of fungi (antifungals) are the standard treatment for thrush. These medicines are either applied directly to the affected area (topical) or swallowed (oral). The treatment will depend on the severity of the condition.  Mild Thrush Mild cases of thrush may clear up with the use of an antifungal mouth rinse or lozenges. Treatment usually lasts about 14 days.  Moderate to Severe Thrush  More severe thrush infections that have spread to the esophagus are treated with an oral antifungal medicine. A topical antifungal medicine may also be used.   For some severe infections, a treatment period longer than 14 days may be needed.   Oral antifungal medicines are almost never used during pregnancy because the fetus may be harmed. However, if a pregnant woman has a rare, severe thrush infection that has spread to her blood, oral antifungal medicines may be used. In this case, the risk of harm to the mother and fetus from the severe thrush infection may be greater than the risk posed by the use of antifungal medicines.  Persistent or Recurrent Thrush For cases of thrush that do not go away or keep coming back, treatment may involve the following:   Treatment may be needed twice as long as the symptoms last.   Treatment will   include both oral and topical antifungal medicines.   People with weakened immune systems can take an antifungal medicine on a continuous basis to prevent thrush infections.  It is important to treat conditions that make you more likely to get thrush, such as diabetes or HIV.  HOME CARE INSTRUCTIONS   Only take over-the-counter or prescription medicine as directed by your health care provider. Talk to your health care  provider about an over-the-counter medicine called gentian violet, which kills bacteria and fungi.   Eat plain, unflavored yogurt as directed by your health care provider. Check the label to make sure the yogurt contains live cultures. This yogurt can help healthy bacteria grow in the mouth that can stop the growth of the fungus that causes thrush.   Try these measures to help reduce the discomfort of thrush:   Drink cold liquids such as water or iced tea.   Try flavored ice treats or frozen juices.   Eat foods that are easy to swallow, such as gelatin, ice cream, or custard.   If the patches in your mouth are painful, try drinking from a straw.   Rinse your mouth several times a day with a warm saltwater rinse. You can make the saltwater mixture with 1 tsp (6 g) of salt in 8 fl oz (0.2 L) of warm water.   If you wear dentures, remove the dentures before going to bed, brush them vigorously, and soak them in a cleaning solution as directed by your health care provider.   Women who are breastfeeding should clean their nipples with an antifungal medicine as directed by their health care provider. Dry the nipples after breastfeeding. Applying lanolin-containing body lotion may help relieve nipple soreness.  SEEK MEDICAL CARE IF:  Your symptoms are getting worse or are not improving within 7 days of starting treatment.   You have symptoms of spreading infection, such as white patches on the skin outside of the mouth.   You are nursing and you have redness, burning, or pain in the nipples that is not relieved with treatment.  MAKE SURE YOU:  Understand these instructions.  Will watch your condition.  Will get help right away if you are not doing well or get worse. Document Released: 05/21/2004 Document Revised: 06/16/2013 Document Reviewed: 03/29/2013 ExitCare Patient Information 2015 [employer]. This information is not intended to replace advice given to you by your  health care provider. Make sure you discuss any questions you have with your health care provider.  

## 2014-07-07 ENCOUNTER — Ambulatory Visit: Payer: BC Managed Care – PPO | Admitting: Family Medicine

## 2014-07-07 ENCOUNTER — Telehealth: Payer: Self-pay

## 2014-07-07 ENCOUNTER — Encounter: Payer: Self-pay | Admitting: Family Medicine

## 2014-07-07 MED ORDER — HYDROCOD POLST-CHLORPHEN POLST 10-8 MG/5ML PO LQCR: 5.0000 mL | Freq: Two times a day (BID) | ORAL | Status: DC | PRN

## 2014-07-07 NOTE — Telephone Encounter (Signed)
Spoke to pt- she is going to try to borrow some money to come in today.

## 2014-07-07 NOTE — Telephone Encounter (Signed)
The last message said that she was going to come in, so I didn't send anything else in.  She has hydrocodone at home for pain-she can use that for cough at HS.

## 2014-07-07 NOTE — Telephone Encounter (Signed)
Noted  

## 2014-07-07 NOTE — Telephone Encounter (Signed)
PATIENT STATES SHE CAME IN ON WED. AND WAS DIAGNOSED WITH THRUSH IN HER MOUTH. SHE SAW DR. Joseph Art. HE PRESCRIBED HER TESSALON AND MAGIC MOUTH Frost[employer]. HER GLANDS ARE STILL VERY SWOLLEN. SHE NEEDS TO HAVE HER WORK NOTE EXTENDED THROUGH THURS. SHE CAN PROBABLY RETURN BACK TO WORK ON FRI. SHE ALSO NEEDS SOMETHING CALLED INTO HER PHARMACY FOR HER COUGH. PLEASE CALL HER WHEN THE NOTE IS READY TO BE PICKED UP. BEST PHONE [phone] (CELL)  [facility] IS [facility] [place] [facility])  [place]

## 2014-07-07 NOTE — Telephone Encounter (Signed)
Pt is requesting something stronger for her cough- she is requesting Tussinex. Tessalon is not working at night. She has a raw throat due to her cough.   Note written for pt.- she would like a rtn call when the rx is ready. She will pick both.

## 2014-07-07 NOTE — Telephone Encounter (Signed)
[name], see below message. Pt would like something stronger to take at night for her cough. Wants Tussinex if possible. Please advise. Thanks

## 2014-07-07 NOTE — Telephone Encounter (Signed)
Patient called to cancel appointment - will walk in to see someone. \  Cancelled appointment.

## 2014-07-07 NOTE — Telephone Encounter (Signed)
Patient notified via My Chart.  Meds ordered this encounter  Medications  . chlorpheniramine-HYDROcodone (TUSSIONEX PENNKINETIC ER) 10-8 MG/5ML LQCR    Sig: Take 5 mLs by mouth every 12 (twelve) hours as needed for cough (cough). DON NOT TAKE WITH OTHER HYDROCODONE CONTAINING MEDICATIONS    Dispense:  80 mL    Refill:  0    Order Specific Question:  Supervising Provider    Answer:  DOOLITTLE, ROBERT P [[id number]]

## 2014-07-08 ENCOUNTER — Telehealth: Payer: Self-pay

## 2014-07-08 NOTE — Telephone Encounter (Signed)
Pt is needing her work note extended   Please call [phone]

## 2014-07-09 NOTE — Telephone Encounter (Signed)
RTW on Monday. From 10/28-10/30. Note writte and is up front for p/u. Pt notified.

## 2014-07-09 NOTE — Telephone Encounter (Signed)
Left message on machine to call back to get more details

## 2014-07-25 ENCOUNTER — Telehealth: Payer: Self-pay | Admitting: Physician Assistant

## 2014-07-25 MED ORDER — HYDROCODONE-ACETAMINOPHEN 10-325 MG PO TABS: 1.0000 | ORAL_TABLET | Freq: Three times a day (TID) | ORAL | Status: DC | PRN

## 2014-07-25 NOTE — Telephone Encounter (Signed)
Notified pt on MyChart that Rx is ready.

## 2014-07-25 NOTE — Telephone Encounter (Signed)
Meds ordered this encounter  Medications  . HYDROcodone-acetaminophen (NORCO) 10-325 MG per tablet    Sig: Take 1 tablet by mouth every 8 (eight) hours as needed for moderate pain or severe pain.    Dispense:  90 tablet    Refill:  0    Order Specific Question:  Supervising Provider    Answer:  DOOLITTLE, ROBERT P [[id number]]

## 2014-08-16 ENCOUNTER — Encounter: Payer: Self-pay | Admitting: Physician Assistant

## 2014-08-19 ENCOUNTER — Ambulatory Visit (INDEPENDENT_AMBULATORY_CARE_PROVIDER_SITE_OTHER): Payer: BC Managed Care – PPO | Admitting: Physician Assistant

## 2014-08-19 VITALS — BP 96/68 | HR 90 | Temp 97.6°F | Resp 16 | Ht 67.5 in | Wt 170.0 lb

## 2014-08-19 DIAGNOSIS — R059 Cough, unspecified: Secondary | ICD-10-CM

## 2014-08-19 DIAGNOSIS — E039 Hypothyroidism, unspecified: Secondary | ICD-10-CM

## 2014-08-19 DIAGNOSIS — R05 Cough: Secondary | ICD-10-CM

## 2014-08-19 DIAGNOSIS — J069 Acute upper respiratory infection, unspecified: Secondary | ICD-10-CM

## 2014-08-19 DIAGNOSIS — G8929 Other chronic pain: Secondary | ICD-10-CM

## 2014-08-19 DIAGNOSIS — M25512 Pain in left shoulder: Secondary | ICD-10-CM

## 2014-08-19 DIAGNOSIS — B9789 Other viral agents as the cause of diseases classified elsewhere: Secondary | ICD-10-CM

## 2014-08-19 MED ORDER — OXYCODONE-ACETAMINOPHEN 10-325 MG PO TABS: 1.0000 | ORAL_TABLET | Freq: Three times a day (TID) | ORAL | Status: DC | PRN

## 2014-08-19 MED ORDER — BENZONATATE 100 MG PO CAPS
100.0000 mg | ORAL_CAPSULE | Freq: Two times a day (BID) | ORAL | Status: DC | PRN
Start: 2014-08-19 — End: 2015-01-10

## 2014-08-19 MED ORDER — DIAZEPAM 10 MG PO TABS: ORAL_TABLET | ORAL | Status: DC

## 2014-08-19 NOTE — Progress Notes (Signed)
Subjective:    Patient ID: Barbara Rogers, female    DOB: 08/18/1981, 33 y.o.   MRN: [id number]   PCP: Kyree Fedorko,Mashonda Broski, PA-C  Chief Complaint  Patient presents with  . Medication Problem    Norco     Allergies  Allergen Reactions  . Penicillins     unknown  . Progestins     Extreme moodiness    Patient Active Problem List   Diagnosis Date Noted  . Migraine without aura   . IUD (intrauterine device) in place 06/02/2013  . Hypothyroid 12/09/2012  . Finger fracture, left   . Chronic left shoulder pain   . Mood disorder 03/25/2012  . AR (allergic rhinitis) 03/25/2012  . Dysmenorrhea 03/25/2012  . PMDD (premenstrual dysphoric disorder) 03/25/2012  . Chronic insomnia 03/25/2012  . Migraine     Prior to Admission medications   Medication Sig Start Date End Date Taking? Authorizing Provider  albuterol (PROVENTIL HFA;VENTOLIN HFA) 108 (90 BASE) MCG/ACT inhaler Inhale 2 puffs into the lungs every 4 (four) hours as needed for wheezing (cough, shortness of breath or wheezing.). 11/03/13 03/18/15 Yes Viviane Semidey S Makinsley Schiavi, PA-C  ALPRAZolam (XANAX) 1 MG tablet Take 1-2 mg by mouth at bedtime as needed for sleep.    Yes Historical Provider, MD  amphetamine-dextroamphetamine (ADDERALL) 20 MG tablet Take 20 mg by mouth 3 (three) times daily.   Yes Historical Provider, MD  benzonatate (TESSALON) 100 MG capsule Take 1 capsule (100 mg total) by mouth 2 (two) times daily as needed for cough. 07/06/14  Yes Robyn Haber, MD  chlorpheniramine-HYDROcodone [facility]) 10-8 MG/5ML LQCR Take 5 mLs by mouth every 12 (twelve) hours as needed for cough (cough). DON NOT TAKE WITH OTHER HYDROCODONE CONTAINING MEDICATIONS 07/07/14  Yes Violette Morneault S Takima Encina, PA-C  diazepam (VALIUM) 10 MG tablet TAKE 1/2-1 TABLET BY MOUTH EVERY 8 HOURS 06/13/14  Yes Pari Lombard S Bayani Renteria, PA-C  Diphenhyd-Hydrocort-Nystatin (FIRST-DUKES MOUTHWASH) SUSP Use as directed 5 mLs in the mouth or throat every 4 (four) hours as  needed. 07/06/14  Yes Robyn Haber, MD  FIBER DIET PO Take by mouth.   Yes Historical Provider, MD  HYDROcodone-acetaminophen (NORCO) 10-325 MG per tablet Take 1 tablet by mouth every 8 (eight) hours as needed for moderate pain or severe pain. 07/25/14  Yes Lenoard Helbert S Terius Jacuinde, PA-C  IUD's (PARAGARD INTRAUTERINE COPPER IU) 1 Units by Intrauterine route.   Yes Historical Provider, MD  levothyroxine (SYNTHROID, LEVOTHROID) 50 MCG tablet Take 1 tablet (50 mcg total) by mouth daily. 08/18/13  Yes Kellene Mccleary S [place] Kirchner, PA-C  lidocaine (LIDODERM) 5 % PLACE 2 PATCHES ONTO THE SKIN DAILY. REMOVE & DISCARD PATCH WITHIN 12 HOURS OR AS DIRECTED BY MD 05/29/14  Yes Shamere Dilworth S Brayan Votaw, PA-C  LYRICA 200 MG capsule TAKE ONE CAPSULE BY MOUTH 3 TIMES A DAY 05/29/14  Yes Nadene Witherspoon S Benito Lemmerman, PA-C  mometasone (NASONEX) 50 MCG/ACT nasal spray 2 sprays in each nostril each day 11/03/13  Yes  S , PA-C  Multiple Vitamins-Minerals (MULTIVITAMINS THER. W/MINERALS) TABS Take 1 tablet by mouth daily.    Yes Historical Provider, MD  phentermine (ADIPEX-P) 37.5 MG tablet Take 1 tablet by mouth daily. 01/04/14  Yes Historical Provider, MD  QUEtiapine (SEROQUEL) 400 MG tablet Take 400 mg by mouth at bedtime.   Yes Historical Provider, MD  zolpidem (AMBIEN) 10 MG tablet Take 10 mg by mouth at bedtime as needed for sleep.    Yes Historical Provider, MD  Alum & Mag Hydroxide-Simeth (MAGIC MOUTHWASH)  SOLN  07/06/14   Historical Provider, MD  Asenapine Maleate (SAPHRIS) 10 MG SUBL Place 10 mg under the tongue 2 (two) times daily.     Historical Provider, MD  fluconazole (DIFLUCAN) 150 MG tablet Take 1 tablet (150 mg total) by mouth once. Patient not taking: Reported on 08/19/2014 07/06/14   Robyn Haber, MD  ipratropium (ATROVENT) 0.03 % nasal spray Place 2 sprays into both nostrils 2 (two) times daily. Patient not taking: Reported on 08/19/2014 11/25/13   Fara Chute, PA-C    Medical, Surgical, Family and Social History  reviewed and updated.  HPI  Presents for refill of pain medications. She is wanting to use oxycodone this month instead of her usual hydrocodone due to increased pain.  Robaxin and Flexeril haven't helped. Valium helped some.  "I hurt from the tip of my fingers to the tip of my toes."  She's working 14 hour days, 6-7 days/week. Expects to work this schedule through January 10. Isn't having to lift more than 50 lbs, and does minimal overhead work. Can bring home $1000/week (compared to $350 usually, when she works 5 hour shifts).  Her grandmother died, and she's trying to help her grandfather financially. 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 She feels very stressed financially. Difficulty sleeping. "I had this when my mom died, too." Doesn't see her psychiatrist again for several months. In general in pleased with her current mood treatment.  Poor appetite. Always cold. Asks for a prescription for Megace because "it worked well for my grandma." Subsisting on chocolate milk and Ensure shakes. Nothing else sounds palatable to her.  Feels like she's coming down with another virus.  Sore throat. Cough. No facial pain or pressure. No fever/chills. She had a refill on the Magic Mouthwash from last time and is using it again. She continues to smoke. Using a vapor device to help her cut back, but really isn't motivated to quit.  Review of Systems As above.    Objective:   Physical Exam  Constitutional: She is oriented to person, place, and time. She appears well-developed and well-nourished. She is active and cooperative. No distress.  BP 96/68 mmHg  Pulse 90  Temp(Src) 97.6 F (36.4 C) (Oral)  Resp 16  Ht 5' 7.5" (1.715 m)  Wt 170 lb (77.111 kg)  BMI 26.22 kg/m2  SpO2 96%  LMP 08/07/2014   HENT:  Head: Normocephalic and atraumatic.  Right Ear: External ear normal.  Left Ear: External ear normal.  Nose: Nose normal.  Mouth/Throat:  Oropharynx is clear and moist. No oropharyngeal exudate.  Eyes: Conjunctivae and EOM are normal. Pupils are equal, round, and reactive to light. No scleral icterus.  Neck: Normal range of motion. Neck supple. No thyromegaly present.  Cardiovascular: Normal rate, regular rhythm, normal heart sounds and intact distal pulses.   Pulmonary/Chest: Effort normal and breath sounds normal.  Musculoskeletal:       Arms: Lymphadenopathy:    She has no cervical adenopathy.  Neurological: She is alert and oriented to person, place, and time.  Psychiatric: She has a normal mood and affect. Her speech is normal and behavior is normal.          Assessment & Plan:  1. Chronic left shoulder pain Switch to oxycodone this month. She's advised not to use any Tussionex (she only has a little left). She also knows not to take alprazolam when using the lorazepam. Again advised her that it's not a  good idea to take pain medications that allow her to continue to over use her body.  She thinks that she just needs the pain relief to allow her the time to adjust to the increased work hours, like getting in shape.  - oxyCODONE-acetaminophen (PERCOCET) 10-325 MG per tablet; Take 1 tablet by mouth every 8 (eight) hours as needed for pain.  Dispense: 90 tablet; Refill: 0 - diazepam (VALIUM) 10 MG tablet; TAKE 1/2-1 TABLET BY MOUTH EVERY 8 HOURS  Dispense: 60 tablet; Refill: 0  2. Viral URI with cough Needs to stop smoking. Rest. Fluids. Reminded not to use Tussionex while taking the oxycodone above. - benzonatate (TESSALON) 100 MG capsule; Take 1 capsule (100 mg total) by mouth 2 (two) times daily as needed for cough.  Dispense: 60 capsule; Refill: 0  3. Hypothyroidism, unspecified hypothyroidism type Last labs in 01/2014. She's going to need some fasting labs for her psychiatrist, glucose and something else, so wants to hold off on TSH today.  Fara Chute, PA-C Physician Assistant-Certified [facility]

## 2014-08-19 NOTE — Patient Instructions (Addendum)
Call your psychiatrist to see if you can be seen earlier or if she can recommend an adjustment to help you in the grieving process. Quitting smoking is really important!!!

## 2014-08-22 ENCOUNTER — Encounter: Payer: Self-pay | Admitting: Physician Assistant

## 2014-08-22 ENCOUNTER — Other Ambulatory Visit: Payer: Self-pay | Admitting: Physician Assistant

## 2014-08-23 ENCOUNTER — Encounter: Payer: Self-pay | Admitting: Physician Assistant

## 2014-08-23 DIAGNOSIS — J45901 Unspecified asthma with (acute) exacerbation: Secondary | ICD-10-CM

## 2014-08-24 MED ORDER — ALBUTEROL SULFATE HFA 108 (90 BASE) MCG/ACT IN AERS: 2.0000 | INHALATION_SPRAY | RESPIRATORY_TRACT | Status: DC | PRN

## 2014-08-24 NOTE — Addendum Note (Signed)
Addended by: Fara Chute on: 08/24/2014 05:04 PM   Modules accepted: Orders

## 2014-09-07 ENCOUNTER — Encounter: Payer: Self-pay | Admitting: Physician Assistant

## 2014-09-09 ENCOUNTER — Other Ambulatory Visit: Payer: Self-pay | Admitting: Physician Assistant

## 2014-09-12 MED ORDER — PREGABALIN 200 MG PO CAPS: 200.0000 mg | ORAL_CAPSULE | Freq: Three times a day (TID) | ORAL | Status: DC

## 2014-09-12 NOTE — Telephone Encounter (Signed)
[name], I had OK'd Lyrica w/a RF and then remembered that it was controlled. I had Judson Roch shred the first Rx w/RF and am sending this to you for review. Thanks

## 2014-09-12 NOTE — Addendum Note (Signed)
Addended by: Elwyn Reach A on: 09/12/2014 09:45 AM   Modules accepted: Orders

## 2014-09-13 NOTE — Telephone Encounter (Signed)
Faxed

## 2014-09-14 ENCOUNTER — Encounter: Payer: Self-pay | Admitting: Gynecology

## 2014-09-15 ENCOUNTER — Encounter: Payer: Self-pay | Admitting: Physician Assistant

## 2014-09-15 MED ORDER — HYDROCODONE-ACETAMINOPHEN 10-325 MG PO TABS: 1.0000 | ORAL_TABLET | Freq: Three times a day (TID) | ORAL | Status: DC | PRN

## 2014-09-15 NOTE — Telephone Encounter (Signed)
Patient notified via My Chart.  Meds ordered this encounter  Medications  . HYDROcodone-acetaminophen (NORCO) 10-325 MG per tablet    Sig: Take 1 tablet by mouth every 8 (eight) hours as needed for moderate pain or severe pain.    Dispense:  90 tablet    Refill:  0    Order Specific Question:  Supervising Provider    Answer:  DOOLITTLE, ROBERT P [[id number]]

## 2014-09-27 ENCOUNTER — Other Ambulatory Visit: Payer: Self-pay

## 2014-09-27 DIAGNOSIS — G8929 Other chronic pain: Secondary | ICD-10-CM

## 2014-09-27 DIAGNOSIS — M25512 Pain in left shoulder: Principal | ICD-10-CM

## 2014-09-27 NOTE — Telephone Encounter (Signed)
Pharm reqs RF of diazepam. Pended.

## 2014-09-28 ENCOUNTER — Other Ambulatory Visit: Payer: Self-pay | Admitting: Physician Assistant

## 2014-09-28 DIAGNOSIS — G8929 Other chronic pain: Secondary | ICD-10-CM

## 2014-09-28 DIAGNOSIS — M25512 Pain in left shoulder: Principal | ICD-10-CM

## 2014-09-28 MED ORDER — DIAZEPAM 10 MG PO TABS: ORAL_TABLET | ORAL | Status: DC

## 2014-09-28 NOTE — Telephone Encounter (Signed)
Faxed

## 2014-09-30 ENCOUNTER — Other Ambulatory Visit: Payer: Self-pay | Admitting: Physician Assistant

## 2014-10-13 ENCOUNTER — Encounter: Payer: Self-pay | Admitting: Physician Assistant

## 2014-10-14 ENCOUNTER — Other Ambulatory Visit: Payer: Self-pay | Admitting: Physician Assistant

## 2014-10-14 MED ORDER — HYDROCODONE-ACETAMINOPHEN 10-325 MG PO TABS: 1.0000 | ORAL_TABLET | Freq: Three times a day (TID) | ORAL | Status: DC | PRN

## 2014-10-14 NOTE — Telephone Encounter (Signed)
Patient notified via My Chart.  Meds ordered this encounter  Medications  . HYDROcodone-acetaminophen (NORCO) 10-325 MG per tablet    Sig: Take 1 tablet by mouth every 8 (eight) hours as needed for moderate pain or severe pain.    Dispense:  90 tablet    Refill:  0    Order Specific Question:  Supervising Provider    Answer:  DOOLITTLE, ROBERT P [[id number]]

## 2014-10-14 NOTE — Addendum Note (Signed)
Addended by: Fara Chute on: 10/14/2014 05:27 PM   Modules accepted: Orders

## 2014-11-09 ENCOUNTER — Encounter: Payer: Self-pay | Admitting: Physician Assistant

## 2014-11-11 ENCOUNTER — Other Ambulatory Visit: Payer: Self-pay | Admitting: Physician Assistant

## 2014-11-11 DIAGNOSIS — M25512 Pain in left shoulder: Principal | ICD-10-CM

## 2014-11-11 DIAGNOSIS — G8929 Other chronic pain: Secondary | ICD-10-CM

## 2014-11-11 MED ORDER — DIAZEPAM 10 MG PO TABS: ORAL_TABLET | ORAL | Status: DC

## 2014-11-11 MED ORDER — HYDROCODONE-ACETAMINOPHEN 10-325 MG PO TABS: 1.0000 | ORAL_TABLET | Freq: Three times a day (TID) | ORAL | Status: DC | PRN

## 2014-11-11 NOTE — Telephone Encounter (Signed)
Barbara Rogers advised pt Rx is ready.

## 2014-11-11 NOTE — Telephone Encounter (Signed)
CORRECTION: other request was for diazepam 10 mg, not alprazolam. Pended diazepam.

## 2014-11-11 NOTE — Telephone Encounter (Signed)
Pharm also sent request for Rf of alprazolam, so I have pended it also

## 2014-11-15 ENCOUNTER — Encounter: Payer: Self-pay | Admitting: Physician Assistant

## 2014-11-15 ENCOUNTER — Telehealth: Payer: Self-pay

## 2014-11-15 NOTE — Telephone Encounter (Signed)
Pt called stating that her FMLA ppw from 2/8 was filled out incorrectly. She states that they are wanting more information on the patients condition, and what she is needing leave intermittently. I have printed out ppw on file, and will place in [name]'s box.

## 2014-11-16 NOTE — Telephone Encounter (Signed)
Scanned copy of completed FMLA forms on 11/16/2014. They have been faxed and patient has been notified through [place] and viz phone call that her paperwork is done, faxed and she can pick up the original copy from the walk in center at [facility].

## 2014-11-16 NOTE — Telephone Encounter (Signed)
Forms brought in my patient and scanned on 10/17/14 were never brought to me, completed or signed.  They are essentially blank, save for the patient's portions.  I have completed the form, signed it, and discussed with [place]. Completed form given to [place].  Patient notified by My Chart that it has been completed.

## 2014-12-14 ENCOUNTER — Encounter (HOSPITAL_COMMUNITY): Payer: Self-pay | Admitting: Emergency Medicine

## 2014-12-14 ENCOUNTER — Emergency Department (HOSPITAL_COMMUNITY)
Admission: EM | Admit: 2014-12-14 | Discharge: 2014-12-14 | Disposition: A | Discharge status: Home / Self Care | Payer: BLUE CROSS/BLUE SHIELD | Attending: Emergency Medicine | Admitting: Emergency Medicine

## 2014-12-14 DIAGNOSIS — Z79899 Other long term (current) drug therapy: Secondary | ICD-10-CM | POA: Diagnosis not present

## 2014-12-14 DIAGNOSIS — G8929 Other chronic pain: Secondary | ICD-10-CM | POA: Diagnosis not present

## 2014-12-14 DIAGNOSIS — F329 Major depressive disorder, single episode, unspecified: Secondary | ICD-10-CM | POA: Diagnosis not present

## 2014-12-14 DIAGNOSIS — F419 Anxiety disorder, unspecified: Secondary | ICD-10-CM | POA: Insufficient documentation

## 2014-12-14 DIAGNOSIS — Z8679 Personal history of other diseases of the circulatory system: Secondary | ICD-10-CM | POA: Insufficient documentation

## 2014-12-14 DIAGNOSIS — Z7951 Long term (current) use of inhaled steroids: Secondary | ICD-10-CM | POA: Insufficient documentation

## 2014-12-14 DIAGNOSIS — Z87448 Personal history of other diseases of urinary system: Secondary | ICD-10-CM | POA: Diagnosis not present

## 2014-12-14 DIAGNOSIS — J45909 Unspecified asthma, uncomplicated: Secondary | ICD-10-CM | POA: Diagnosis not present

## 2014-12-14 DIAGNOSIS — R112 Nausea with vomiting, unspecified: Secondary | ICD-10-CM

## 2014-12-14 DIAGNOSIS — R51 Headache: Secondary | ICD-10-CM | POA: Insufficient documentation

## 2014-12-14 LAB — URINALYSIS, ROUTINE W REFLEX MICROSCOPIC
GLUCOSE, UA: NEGATIVE mg/dL
Ketones, ur: >80 mg/dL — AB
NITRITE: NEGATIVE
Protein, ur: 30 mg/dL — AB
SPECIFIC GRAVITY, URINE: 1.02 (ref 1.005–1.030)
Urobilinogen, UA: 0.2 mg/dL (ref 0.0–1.0)
pH: 7 (ref 5.0–8.0)

## 2014-12-14 LAB — CBC WITH DIFFERENTIAL/PLATELET
Basophils Absolute: 0 10*3/uL (ref 0.0–0.1)
Basophils Relative: 0 % (ref 0–1)
EOS PCT: 0 % (ref 0–5)
Eosinophils Absolute: 0 10*3/uL (ref 0.0–0.7)
HEMATOCRIT: 45.3 % (ref 36.0–46.0)
HEMOGLOBIN: 15.6 g/dL — AB (ref 12.0–15.0)
LYMPHS ABS: 1.1 10*3/uL (ref 0.7–4.0)
LYMPHS PCT: 15 % (ref 12–46)
MCH: 30.8 pg (ref 26.0–34.0)
MCHC: 34.4 g/dL (ref 30.0–36.0)
MCV: 89.5 fL (ref 78.0–100.0)
MONO ABS: 0.4 10*3/uL (ref 0.1–1.0)
MONOS PCT: 5 % (ref 3–12)
Neutro Abs: 6.1 10*3/uL (ref 1.7–7.7)
Neutrophils Relative %: 80 % — ABNORMAL HIGH (ref 43–77)
Platelets: 214 10*3/uL (ref 150–400)
RBC: 5.06 MIL/uL (ref 3.87–5.11)
RDW: 13.7 % (ref 11.5–15.5)
WBC: 7.6 10*3/uL (ref 4.0–10.5)

## 2014-12-14 LAB — COMPREHENSIVE METABOLIC PANEL
ALK PHOS: 77 U/L (ref 39–117)
ALT: 10 U/L (ref 0–35)
AST: 15 U/L (ref 0–37)
Albumin: 4.6 g/dL (ref 3.5–5.2)
Anion gap: 9 (ref 5–15)
BILIRUBIN TOTAL: 0.5 mg/dL (ref 0.3–1.2)
BUN: 11 mg/dL (ref 6–23)
CHLORIDE: 104 mmol/L (ref 96–112)
CO2: 26 mmol/L (ref 19–32)
Calcium: 9.7 mg/dL (ref 8.4–10.5)
Creatinine, Ser: 0.77 mg/dL (ref 0.50–1.10)
GFR calc Af Amer: >90 mL/min (ref >90)
GFR calc non Af Amer: >90 mL/min (ref >90)
Glucose, Bld: 108 mg/dL — ABNORMAL HIGH (ref 70–99)
POTASSIUM: 3.9 mmol/L (ref 3.5–5.1)
Sodium: 139 mmol/L (ref 135–145)
Total Protein: 8 g/dL (ref 6.0–8.3)

## 2014-12-14 LAB — URINE MICROSCOPIC-ADD ON

## 2014-12-14 LAB — LIPASE, BLOOD: Lipase: 18 U/L (ref 11–59)

## 2014-12-14 MED ORDER — HYDROCODONE-ACETAMINOPHEN 10-325 MG PO TABS: 1.0000 | ORAL_TABLET | Freq: Three times a day (TID) | ORAL | Status: DC | PRN

## 2014-12-14 MED ORDER — ONDANSETRON HCL 4 MG/2ML IJ SOLN: 4.0000 mg | Freq: Once | INTRAMUSCULAR | Status: DC

## 2014-12-14 MED ORDER — SODIUM CHLORIDE 0.9 % IV BOLUS (SEPSIS)
1000.0000 mL | Freq: Once | INTRAVENOUS | Status: AC
  Administered 2014-12-14: 1000 mL via INTRAVENOUS

## 2014-12-14 MED ORDER — DICYCLOMINE HCL 10 MG PO CAPS: 20.0000 mg | ORAL_CAPSULE | Freq: Once | ORAL | Status: DC

## 2014-12-14 MED ORDER — KETOROLAC TROMETHAMINE 30 MG/ML IJ SOLN: 30.0000 mg | Freq: Once | INTRAMUSCULAR | Status: DC

## 2014-12-14 NOTE — ED Notes (Signed)
Pt apparently upset over the wait for results, told nursing tech that she was leaving.  Did not tell the nurse or discuss options

## 2014-12-14 NOTE — Telephone Encounter (Signed)
Rx in drawer. 

## 2014-12-14 NOTE — Telephone Encounter (Signed)
Patient notified via My Chart.  Meds ordered this encounter  Medications  . HYDROcodone-acetaminophen (NORCO) 10-325 MG per tablet    Sig: Take 1 tablet by mouth every 8 (eight) hours as needed for moderate pain or severe pain.    Dispense:  90 tablet    Refill:  0    Order Specific Question:  Supervising Provider    Answer:  DOOLITTLE, ROBERT P [[id number]]

## 2014-12-14 NOTE — ED Provider Notes (Signed)
TIME SEEN: 6:00 PM  CHIEF COMPLAINT: Nausea, vomiting, abdominal pain, headache  HPI: Pt is a 34 y.o. female with history of migraines, depression, kidney stones, asthma who presents to the emergency department with complaints of 3 days of nonbloody, nonbilious vomiting, abdominal cramping diffusely. States that she is not even able to keep water down and states because of that she is having a diffuse throbbing headache. Denies any aggravating or relieving factors. No diarrhea. No dysuria or hematuria. No vaginal bleeding or discharge. Last menstrual period was March 12. She has an IUD. No sick contacts or recent travel. No prior abdominal surgeries.  ROS: See HPI Constitutional: no fever  Eyes: no drainage  ENT: no runny nose   Cardiovascular:  no chest pain  Resp: no SOB  GI:  vomiting GU: no dysuria Integumentary: no rash  Allergy: no hives  Musculoskeletal: no leg swelling  Neurological: no slurred speech ROS otherwise negative  PAST MEDICAL HISTORY/PAST SURGICAL HISTORY:  Past Medical History  Diagnosis Date  . Allergy   . Migraine without aura   . Anxiety   . Depression   . Dysmenorrhea   . PMDD (premenstrual dysphoric disorder)   . Insomnia   . Nephrolithiasis   . Finger fracture, left 10/31/2012    LEFT 4th finger  . Chronic left shoulder pain 08/2012  . Asthma     exacerbated by bronchitisi    MEDICATIONS:  Prior to Admission medications   Medication Sig Start Date End Date Taking? Authorizing Provider  albuterol (PROVENTIL HFA;VENTOLIN HFA) 108 (90 BASE) MCG/ACT inhaler Inhale 2 puffs into the lungs every 4 (four) hours as needed for wheezing (cough, shortness of breath or wheezing.). 08/24/14 01/06/16  [name] S Jeffery, PA-C  ALPRAZolam Duanne Moron) 1 MG tablet Take 1-2 mg by mouth at bedtime as needed for sleep.     Historical Provider, MD  Alum & Mag Hydroxide-Simeth (MAGIC MOUTHWASH) SOLN  07/06/14   Historical Provider, MD  amphetamine-dextroamphetamine (ADDERALL)  20 MG tablet Take 20 mg by mouth 3 (three) times daily.    Historical Provider, MD  Asenapine Maleate (SAPHRIS) 10 MG SUBL Place 10 mg under the tongue 2 (two) times daily.     Historical Provider, MD  benzonatate (TESSALON) 100 MG capsule Take 1 capsule (100 mg total) by mouth 2 (two) times daily as needed for cough. 08/19/14   [name] Janalee Dane, PA-C  chlorpheniramine-HYDROcodone (TUSSIONEX PENNKINETIC ER) 10-8 MG/5ML LQCR Take 5 mLs by mouth every 12 (twelve) hours as needed for cough (cough). DON NOT TAKE WITH OTHER HYDROCODONE CONTAINING MEDICATIONS 07/07/14   [name] S Jeffery, PA-C  diazepam (VALIUM) 10 MG tablet TAKE 1/2-1 TABLET BY MOUTH EVERY 8 HOURS 11/11/14   [name] S Jeffery, PA-C  Diphenhyd-Hydrocort-Nystatin (FIRST-DUKES MOUTHWASH) SUSP Use as directed 5 mLs in the mouth or throat every 4 (four) hours as needed. 07/06/14   Robyn Haber, MD  FIBER DIET PO Take by mouth.    Historical Provider, MD  fluconazole (DIFLUCAN) 150 MG tablet Take 1 tablet (150 mg total) by mouth once. Patient not taking: Reported on 08/19/2014 07/06/14   Robyn Haber, MD  HYDROcodone-acetaminophen [facility]) 10-325 MG per tablet Take 1 tablet by mouth every 8 (eight) hours as needed for moderate pain or severe pain. 12/14/14   [name] S Jeffery, PA-C  ipratropium (ATROVENT) 0.03 % nasal spray Place 2 sprays into both nostrils 2 (two) times daily. Patient not taking: Reported on 08/19/2014 11/25/13   [name] Janalee Dane, PA-C  IUD's [facility] INTRAUTERINE  COPPER IU) 1 Units by Intrauterine route.    Historical Provider, MD  levothyroxine (SYNTHROID, LEVOTHROID) 50 MCG tablet TAKE 1 TABLET (50 MCG TOTAL) BY MOUTH DAILY. 08/23/14   [name] S Jeffery, PA-C  lidocaine (LIDODERM) 5 % PLACE 2 PATCHES ONTO THE SKIN DAILY. REMOVE & DISCARD PATCH WITHIN 12 HOURS OR AS DIRECTED BY MD 09/12/14   Fara Chute, PA-C  mometasone (NASONEX) 50 MCG/ACT nasal spray 2 sprays in each nostril each day 11/03/13   Fara Chute, PA-C   Multiple Vitamins-Minerals (MULTIVITAMINS THER. W/MINERALS) TABS Take 1 tablet by mouth daily.     Historical Provider, MD  oxyCODONE-acetaminophen (PERCOCET) 10-325 MG per tablet Take 1 tablet by mouth every 8 (eight) hours as needed for pain. 08/19/14   [name] S Jeffery, PA-C  phentermine (ADIPEX-P) 37.5 MG tablet Take 1 tablet by mouth daily. 01/04/14   Historical Provider, MD  pregabalin (LYRICA) 200 MG capsule Take 1 capsule (200 mg total) by mouth 3 (three) times daily. 09/12/14   [name] S Jeffery, PA-C  QUEtiapine (SEROQUEL) 400 MG tablet Take 400 mg by mouth at bedtime.    Historical Provider, MD  zolpidem (AMBIEN) 10 MG tablet Take 10 mg by mouth at bedtime as needed for sleep.     Historical Provider, MD    ALLERGIES:  Allergies  Allergen Reactions  . Penicillins     unknown  . Progestins     Extreme moodiness    SOCIAL HISTORY:  History  Substance Use Topics  . Smoking status: Current Every Day Smoker -- 1.00 packs/day    Types: Cigarettes  . Smokeless tobacco: Never Used     Comment: using e-cig to try to quit  . Alcohol Use: No    FAMILY HISTORY: Family History  Problem Relation Age of Onset  . Migraines Mother   . Mental illness Mother   . Heart disease Father   . Alcohol abuse Father   . Arthritis Maternal Grandmother   . Thyroid disease Maternal Grandmother   . Heart disease Maternal Grandfather     AMI 1996, 2014  . Anemia Maternal Grandfather     bone marrow dysfunction    EXAM: BP 116/78 mmHg  Pulse 66  Temp(Src) 97.8 F (36.6 C) (Oral)  Resp 16  Ht 5\' 8"  (1.727 m)  Wt 165 lb (74.844 kg)  BMI 25.09 kg/m2  SpO2 97%  LMP 11/19/2014 CONSTITUTIONAL: Alert and oriented and responds appropriately to questions. Well-appearing; well-nourished, nontoxic HEAD: Normocephalic EYES: Conjunctivae clear, PERRL ENT: normal nose; no rhinorrhea; slightly dry mucous membranes; pharynx without lesions noted NECK: Supple, no meningismus, no LAD  CARD: RRR; S1  and S2 appreciated; no murmurs, no clicks, no rubs, no gallops RESP: Normal chest excursion without splinting or tachypnea; breath sounds clear and equal bilaterally; no wheezes, no rhonchi, no rales ABD/GI: Normal bowel sounds; non-distended; soft, non-tender, no rebound, no guarding, negative Murphy sign, no tenderness at McBurney's point, no peritoneal signs BACK:  The back appears normal and is non-tender to palpation, there is no CVA tenderness EXT: Normal ROM in all joints; non-tender to palpation; no edema; normal capillary refill; no cyanosis    SKIN: Normal color for age and race; warm NEURO: Moves all extremities equally sensation to light touch intact diffusely, cranial nerves II through XII intact, normal gait PSYCH: The patient's mood and manner are appropriate. Grooming and personal hygiene are appropriate.  MEDICAL DECISION MAKING: Patient here with what appears to be viral gastritis. She is also  complaining of diffuse headache. We'll treat with IV fluids, Toradol, Bentyl, Zofran. We'll also obtain abdominal labs, urinalysis, urine pregnancy. I do not feel she needs abdominal imaging at this time given her benign abdominal exam.  ED PROGRESS: Patient's labs are unremarkable. Urine shows small hemoglobin and trace leukocytes but also many squamous cells and few bacteria. Suspect dirty catch. She had no urinary symptoms. When I went back to update the patient on her results and see how she was feeling, patient had artery left emergency department. I was not informed that the patient had left. I was not able to update her or discussed return precautions or re-examine her.  UPT had not yet resulted and was not documented.     [place], DO 12/14/14 [phone]

## 2014-12-14 NOTE — ED Notes (Signed)
Pt reports emesis x 3 days, headache today.

## 2014-12-21 ENCOUNTER — Encounter: Payer: Self-pay | Admitting: Physician Assistant

## 2014-12-21 MED ORDER — PROMETHAZINE HCL 25 MG PO TABS: 25.0000 mg | ORAL_TABLET | Freq: Three times a day (TID) | ORAL | Status: DC | PRN

## 2015-01-05 ENCOUNTER — Telehealth: Payer: Self-pay | Admitting: Physician Assistant

## 2015-01-05 DIAGNOSIS — M25512 Pain in left shoulder: Principal | ICD-10-CM

## 2015-01-05 DIAGNOSIS — G8929 Other chronic pain: Secondary | ICD-10-CM

## 2015-01-06 ENCOUNTER — Telehealth: Payer: Self-pay | Admitting: Radiology

## 2015-01-06 MED ORDER — DIAZEPAM 10 MG PO TABS: ORAL_TABLET | ORAL | Status: DC

## 2015-01-06 NOTE — Telephone Encounter (Signed)
Pt called again. Let her know that we were waiting for [name], and that she was probably going to want to talk to [facility] on Monday

## 2015-01-06 NOTE — Addendum Note (Signed)
Addended by: Fara Chute on: 01/06/2015 03:42 PM   Modules accepted: Orders

## 2015-01-06 NOTE — Telephone Encounter (Signed)
Pt called back. Spoke with her. She said that chelle knows pt gets Xanax from Dr. Toy Care for anxiety and Valium from [facility] for back spasms.

## 2015-01-06 NOTE — Telephone Encounter (Signed)
rx called in and pt notified. 

## 2015-01-06 NOTE — Telephone Encounter (Signed)
I don't think I knew that-it's not been on her medicaiton list for some time now. Does Dr. Toy Care know that she takes both?  I need to confirm with Dr. Toy Care.

## 2015-01-06 NOTE — Telephone Encounter (Signed)
[facility] with concern of pt's rx's he is receiving. On 4/18, he received, from Dr Toy Care, Xanax 1 mg qid #120, also on 4/18 he received ambien 10mg . Told pharmacist to hold valium rx until we verify that we even want to go through with the rx. Please advise.

## 2015-01-06 NOTE — Telephone Encounter (Signed)
Please hold the valium until we can clarify with Dr. Toy Care on Monday.

## 2015-01-06 NOTE — Telephone Encounter (Signed)
Patient notified via My Chart.  Meds ordered this encounter  Medications  . diazepam (VALIUM) 10 MG tablet    Sig: TAKE 1/2-1 TABLET BY MOUTH EVERY 8 HOURS    Dispense:  60 tablet    Refill:  0    Not to exceed 5 additional fills before 08/16/2014    Order Specific Question:  Supervising Provider    Answer:  Tami Lin P [[id number]]

## 2015-01-07 NOTE — Telephone Encounter (Signed)
Pt is checking on status of her request

## 2015-01-09 NOTE — Telephone Encounter (Signed)
Spoke with [facility] at [facility]. He was inquiring about the pending rx on the valium. I told him we are still waiting on [name] to speak to Dr Toy Care.

## 2015-01-09 NOTE — Telephone Encounter (Signed)
[name], Please advise.

## 2015-01-09 NOTE — Telephone Encounter (Signed)
Chase from [facility] is calling back to speak with Wells Guiles on behalf of patient. Please call! [phone]

## 2015-01-09 NOTE — Telephone Encounter (Signed)
Left message with receptionist regarding this issue. She will relay the message and return my call for care coordination.

## 2015-01-10 ENCOUNTER — Encounter: Payer: Self-pay | Admitting: Physician Assistant

## 2015-01-10 MED ORDER — CYCLOBENZAPRINE HCL 10 MG PO TABS: 10.0000 mg | ORAL_TABLET | Freq: Three times a day (TID) | ORAL | Status: DC | PRN

## 2015-01-10 MED ORDER — HYDROCODONE-ACETAMINOPHEN 10-325 MG PO TABS: 1.0000 | ORAL_TABLET | Freq: Four times a day (QID) | ORAL | Status: DC | PRN

## 2015-01-10 NOTE — Telephone Encounter (Signed)
Dr. Toy Care called me back last night and left a message. I had to leave a message when I called her back. Hope to be able to talk today.

## 2015-01-10 NOTE — Telephone Encounter (Signed)
Meds ordered this encounter  Medications  . HYDROcodone-acetaminophen (NORCO) 10-325 MG per tablet    Sig: Take 1 tablet by mouth every 6 (six) hours as needed for moderate pain or severe pain.    Dispense:  90 tablet    Refill:  0    Order Specific Question:  Supervising Provider    Answer:  DOOLITTLE, ROBERT P [[id number]]  . cyclobenzaprine (FLEXERIL) 10 MG tablet    Sig: Take 1 tablet (10 mg total) by mouth 3 (three) times daily as needed for muscle spasms.    Dispense:  30 tablet    Refill:  0    Order Specific Question:  Supervising Provider    Answer:  DOOLITTLE, ROBERT P [[id number]]    Rx printed at 104. I'll bring to 102 after clinic this morning.

## 2015-01-16 ENCOUNTER — Inpatient Hospital Stay (HOSPITAL_COMMUNITY)
Admission: EM | Admit: 2015-01-16 | Discharge: 2015-01-19 | DRG: 885 | Disposition: A | Discharge status: Other Acute Inpatient Hospital | Payer: BLUE CROSS/BLUE SHIELD | Attending: Internal Medicine | Admitting: Internal Medicine

## 2015-01-16 ENCOUNTER — Encounter (HOSPITAL_COMMUNITY): Payer: Self-pay

## 2015-01-16 DIAGNOSIS — F333 Major depressive disorder, recurrent, severe with psychotic symptoms: Secondary | ICD-10-CM | POA: Diagnosis present

## 2015-01-16 DIAGNOSIS — F319 Bipolar disorder, unspecified: Secondary | ICD-10-CM | POA: Diagnosis present

## 2015-01-16 DIAGNOSIS — Z79899 Other long term (current) drug therapy: Secondary | ICD-10-CM

## 2015-01-16 DIAGNOSIS — F1290 Cannabis use, unspecified, uncomplicated: Secondary | ICD-10-CM | POA: Diagnosis present

## 2015-01-16 DIAGNOSIS — E860 Dehydration: Secondary | ICD-10-CM | POA: Diagnosis present

## 2015-01-16 DIAGNOSIS — R443 Hallucinations, unspecified: Secondary | ICD-10-CM | POA: Diagnosis not present

## 2015-01-16 DIAGNOSIS — F309 Manic episode, unspecified: Secondary | ICD-10-CM

## 2015-01-16 DIAGNOSIS — R451 Restlessness and agitation: Secondary | ICD-10-CM

## 2015-01-16 DIAGNOSIS — F332 Major depressive disorder, recurrent severe without psychotic features: Principal | ICD-10-CM | POA: Diagnosis present

## 2015-01-16 DIAGNOSIS — E039 Hypothyroidism, unspecified: Secondary | ICD-10-CM | POA: Diagnosis present

## 2015-01-16 DIAGNOSIS — E876 Hypokalemia: Secondary | ICD-10-CM

## 2015-01-16 DIAGNOSIS — N179 Acute kidney failure, unspecified: Secondary | ICD-10-CM

## 2015-01-16 DIAGNOSIS — N390 Urinary tract infection, site not specified: Secondary | ICD-10-CM | POA: Diagnosis present

## 2015-01-16 DIAGNOSIS — N3001 Acute cystitis with hematuria: Secondary | ICD-10-CM

## 2015-01-16 DIAGNOSIS — F411 Generalized anxiety disorder: Secondary | ICD-10-CM | POA: Diagnosis present

## 2015-01-16 DIAGNOSIS — J45909 Unspecified asthma, uncomplicated: Secondary | ICD-10-CM | POA: Diagnosis present

## 2015-01-16 DIAGNOSIS — F17210 Nicotine dependence, cigarettes, uncomplicated: Secondary | ICD-10-CM | POA: Diagnosis present

## 2015-01-16 LAB — RAPID URINE DRUG SCREEN, HOSP PERFORMED
Amphetamines: NOT DETECTED
BARBITURATES: NOT DETECTED
Benzodiazepines: POSITIVE — AB
Cocaine: NOT DETECTED
Opiates: NOT DETECTED
TETRAHYDROCANNABINOL: POSITIVE — AB

## 2015-01-16 LAB — COMPREHENSIVE METABOLIC PANEL
ALK PHOS: 85 U/L (ref 38–126)
ALT: 13 U/L — AB (ref 14–54)
AST: 17 U/L (ref 15–41)
Albumin: 4.3 g/dL (ref 3.5–5.0)
Anion gap: 12 (ref 5–15)
BUN: 17 mg/dL (ref 6–20)
CHLORIDE: 103 mmol/L (ref 101–111)
CO2: 22 mmol/L (ref 22–32)
Calcium: 9.2 mg/dL (ref 8.9–10.3)
Creatinine, Ser: 2.2 mg/dL — ABNORMAL HIGH (ref 0.44–1.00)
GFR, EST AFRICAN AMERICAN: 33 mL/min — AB (ref >60)
GFR, EST NON AFRICAN AMERICAN: 28 mL/min — AB (ref >60)
Glucose, Bld: 126 mg/dL — ABNORMAL HIGH (ref 70–99)
POTASSIUM: 2.8 mmol/L — AB (ref 3.5–5.1)
SODIUM: 137 mmol/L (ref 135–145)
Total Bilirubin: 0.7 mg/dL (ref 0.3–1.2)
Total Protein: 6.8 g/dL (ref 6.5–8.1)

## 2015-01-16 LAB — CBC WITH DIFFERENTIAL/PLATELET
Basophils Absolute: 0 10*3/uL (ref 0.0–0.1)
Basophils Relative: 0 % (ref 0–1)
EOS PCT: 0 % (ref 0–5)
Eosinophils Absolute: 0 10*3/uL (ref 0.0–0.7)
HCT: 35.6 % — ABNORMAL LOW (ref 36.0–46.0)
Hemoglobin: 12.5 g/dL (ref 12.0–15.0)
Lymphocytes Relative: 16 % (ref 12–46)
Lymphs Abs: 1.6 10*3/uL (ref 0.7–4.0)
MCH: 30.3 pg (ref 26.0–34.0)
MCHC: 35.1 g/dL (ref 30.0–36.0)
MCV: 86.4 fL (ref 78.0–100.0)
Monocytes Absolute: 1.2 10*3/uL — ABNORMAL HIGH (ref 0.1–1.0)
Monocytes Relative: 12 % (ref 3–12)
Neutro Abs: 7 10*3/uL (ref 1.7–7.7)
Neutrophils Relative %: 72 % (ref 43–77)
Platelets: 201 10*3/uL (ref 150–400)
RBC: 4.12 MIL/uL (ref 3.87–5.11)
RDW: 13.1 % (ref 11.5–15.5)
WBC: 9.8 10*3/uL (ref 4.0–10.5)

## 2015-01-16 LAB — ETHANOL: Alcohol, Ethyl (B): <5 mg/dL (ref <5)

## 2015-01-16 MED ORDER — POTASSIUM CHLORIDE CRYS ER 20 MEQ PO TBCR: 40.0000 meq | EXTENDED_RELEASE_TABLET | Freq: Once | ORAL | Status: DC

## 2015-01-16 MED ORDER — PHENTERMINE HCL 37.5 MG PO TABS: 37.5000 mg | ORAL_TABLET | Freq: Every day | ORAL | Status: DC

## 2015-01-16 MED ORDER — IPRATROPIUM BROMIDE 0.06 % NA SOLN
2.0000 | Freq: Two times a day (BID) | NASAL | Status: DC
  Filled 2015-01-16 (×2): qty 15

## 2015-01-16 MED ORDER — AMPHETAMINE-DEXTROAMPHETAMINE 10 MG PO TABS
20.0000 mg | ORAL_TABLET | Freq: Three times a day (TID) | ORAL | Status: DC
  Administered 2015-01-17 – 2015-01-18 (×3): 20 mg via ORAL
  Filled 2015-01-16 (×4): qty 2

## 2015-01-16 MED ORDER — STERILE WATER FOR INJECTION IJ SOLN
INTRAMUSCULAR | Status: AC
  Administered 2015-01-16: 10 mL
  Filled 2015-01-16: qty 10

## 2015-01-16 MED ORDER — PROMETHAZINE HCL 12.5 MG PO TABS: 25.0000 mg | ORAL_TABLET | Freq: Three times a day (TID) | ORAL | Status: DC | PRN

## 2015-01-16 MED ORDER — ZOLPIDEM TARTRATE 5 MG PO TABS
10.0000 mg | ORAL_TABLET | Freq: Every evening | ORAL | Status: DC | PRN
  Administered 2015-01-17 (×2): 10 mg via ORAL
  Filled 2015-01-16 (×2): qty 2

## 2015-01-16 MED ORDER — LEVOTHYROXINE SODIUM 50 MCG PO TABS
50.0000 ug | ORAL_TABLET | Freq: Every day | ORAL | Status: DC
  Administered 2015-01-17 – 2015-01-19 (×2): 50 ug via ORAL
  Filled 2015-01-16 (×2): qty 1

## 2015-01-16 MED ORDER — ADULT MULTIVITAMIN W/MINERALS CH
1.0000 | ORAL_TABLET | Freq: Every day | ORAL | Status: DC
  Administered 2015-01-17: 1 via ORAL
  Filled 2015-01-16 (×2): qty 1

## 2015-01-16 MED ORDER — PREGABALIN 50 MG PO CAPS
200.0000 mg | ORAL_CAPSULE | Freq: Three times a day (TID) | ORAL | Status: DC
  Administered 2015-01-17 – 2015-01-19 (×6): 200 mg via ORAL
  Filled 2015-01-16: qty 1
  Filled 2015-01-16 (×3): qty 2
  Filled 2015-01-16: qty 1
  Filled 2015-01-16: qty 2
  Filled 2015-01-16: qty 1
  Filled 2015-01-16: qty 2
  Filled 2015-01-16: qty 1
  Filled 2015-01-16: qty 2
  Filled 2015-01-16 (×2): qty 1

## 2015-01-16 MED ORDER — CALCIUM POLYCARBOPHIL 625 MG PO TABS
1250.0000 mg | ORAL_TABLET | Freq: Every day | ORAL | Status: DC
  Administered 2015-01-17: 1250 mg via ORAL
  Filled 2015-01-16 (×5): qty 2

## 2015-01-16 MED ORDER — LORAZEPAM 1 MG PO TABS
1.0000 mg | ORAL_TABLET | Freq: Three times a day (TID) | ORAL | Status: DC | PRN
  Administered 2015-01-17 – 2015-01-19 (×7): 1 mg via ORAL
  Filled 2015-01-16 (×7): qty 1

## 2015-01-16 MED ORDER — HYDROCODONE-ACETAMINOPHEN 10-325 MG PO TABS
1.0000 | ORAL_TABLET | Freq: Four times a day (QID) | ORAL | Status: DC | PRN
  Administered 2015-01-17 – 2015-01-18 (×2): 1 via ORAL
  Filled 2015-01-16 (×2): qty 1

## 2015-01-16 MED ORDER — CYCLOBENZAPRINE HCL 10 MG PO TABS: 10.0000 mg | ORAL_TABLET | Freq: Three times a day (TID) | ORAL | Status: DC | PRN

## 2015-01-16 MED ORDER — QUETIAPINE FUMARATE 100 MG PO TABS
400.0000 mg | ORAL_TABLET | Freq: Every day | ORAL | Status: DC
  Administered 2015-01-18 (×2): 400 mg via ORAL
  Filled 2015-01-16 (×2): qty 4

## 2015-01-16 MED ORDER — ASENAPINE MALEATE 10 MG SL SUBL: 10.0000 mg | SUBLINGUAL_TABLET | Freq: Two times a day (BID) | SUBLINGUAL | Status: DC

## 2015-01-16 MED ORDER — ZIPRASIDONE MESYLATE 20 MG IM SOLR
10.0000 mg | Freq: Once | INTRAMUSCULAR | Status: AC
  Administered 2015-01-16: 10 mg via INTRAMUSCULAR

## 2015-01-16 MED ORDER — ALPRAZOLAM 0.5 MG PO TABS
1.0000 mg | ORAL_TABLET | Freq: Every evening | ORAL | Status: DC | PRN
  Administered 2015-01-17: 2 mg via ORAL
  Administered 2015-01-17 – 2015-01-18 (×2): 1 mg via ORAL
  Filled 2015-01-16 (×2): qty 2
  Filled 2015-01-16: qty 4

## 2015-01-16 MED ORDER — ALBUTEROL SULFATE HFA 108 (90 BASE) MCG/ACT IN AERS
2.0000 | INHALATION_SPRAY | RESPIRATORY_TRACT | Status: DC | PRN
  Filled 2015-01-16: qty 6.7

## 2015-01-16 MED ORDER — ZIPRASIDONE MESYLATE 20 MG IM SOLR
INTRAMUSCULAR | Status: AC
Start: 2015-01-16 — End: 2015-01-16
  Administered 2015-01-16: 20 mg
  Filled 2015-01-16: qty 20

## 2015-01-16 MED ORDER — ZIPRASIDONE MESYLATE 20 MG IM SOLR
INTRAMUSCULAR | Status: AC
  Filled 2015-01-16: qty 20

## 2015-01-16 MED ORDER — LORAZEPAM 1 MG PO TABS
1.0000 mg | ORAL_TABLET | Freq: Once | ORAL | Status: AC
  Administered 2015-01-16: 1 mg via ORAL
  Filled 2015-01-16: qty 1

## 2015-01-16 NOTE — ED Notes (Signed)
Patient states since she started her "new depression" medication she has been having the problem with telling realty from dreams, states she was doing "good" on her old medication. Does not know the names of either medication.
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
## 2015-01-16 NOTE — ED Notes (Signed)
PT placed in paper scrubs at this time and clothing, shoes, work(namebadge) placed in pt belonging bag and in psych locker.
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
## 2015-01-16 NOTE — Progress Notes (Signed)
CSW followed up pt's referral at: [facility] - per Marden Noble, referral received and will look at it when beds open. [facility]- per Joycelyn Schmid, having staffing issues, we haven't accepted anyone yet. [facility]- per Gershon Mussel, will call you back when we have updates.  [facility]- per Diane, didn't get to those yet. Follow up later in the evening. [facility]- Per Phoebe, it's in the pile, we'll get to it sometime later.   At capacity: [place] per Tempie Donning- per [facility]- per Berdine Dance- per [facility]- per Carlyon Shadow, low acuity beds only  Left voicemail: [place] Mayer Camel  CSW will continue to seek placement.  Verlon Setting, [place] Disposition staff 01/16/2015 4:40 PM

## 2015-01-16 NOTE — ED Notes (Signed)
Patient remains agitated, completely tore off paper scrubs, refusing to put more scrubs on. Paper now speaking in word salad and at times speech is incomprehensible. Orders for Geodon 10 mg IM received

## 2015-01-16 NOTE — Progress Notes (Signed)
Seeking inpatient placement.  Referral faxed to: [facility] (for waitlist review)- per Theda Sers- per Donnie Mesa- per [facility]- per Arcelia Jew- per Bella Kennedy  At capacity: [place]- per Tempie Donning- per [facility]- per Berdine Dance- per [facility]- per Carlyon Shadow, low acuity beds only  Left voicemail: [place], MSW, [place] [employer], Disposition  01/16/2015 [phone]
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
## 2015-01-16 NOTE — ED Notes (Signed)
Pts fiance number [phone]

## 2015-01-16 NOTE — ED Notes (Signed)
Patient called me into her room to inform me that she is having "electrical impulses through her body"; patient talking very rapidly. Flight of ideas. Patient also inquiring about if she is free to leave, I informed patient we were waiting for test results are this time. [place] and Zammitt made aware.  Per Arby Barrette at [facility] patient does need to be admitted for inpatient treatment. Zammitt made aware, will start IVC paperwork in case patient tries to leave.

## 2015-01-16 NOTE — ED Notes (Signed)
Patient becoming extremely agitated/anxious. Ripping paper scrubs and tearing the strips into piece. Patient visibly shaking. States she doesn't want to stay, she wants to follow up with her regular psychiatrist. I informed Dr. Roderic Palau, IVC paperwork done by him, Geodon ordered.

## 2015-01-16 NOTE — ED Notes (Signed)
Patient ran out of unit, security called and was able to stop patient before leaving building. Patient escorted back to room, IVC paperwork completed and signed by notary at this time. Patient consent to the administration of 10 mg Geodon IM, with two security officers Simona Huh and Jenny Reichmann) and two ED techs Anderson Malta and Volney Presser) in room.

## 2015-01-17 LAB — COMPREHENSIVE METABOLIC PANEL
ALT: 16 U/L (ref 14–54)
AST: 33 U/L (ref 15–41)
Albumin: 4 g/dL (ref 3.5–5.0)
Alkaline Phosphatase: 79 U/L (ref 38–126)
Anion gap: 11 (ref 5–15)
BUN: 28 mg/dL — ABNORMAL HIGH (ref 6–20)
CO2: 22 mmol/L (ref 22–32)
Calcium: 8.8 mg/dL — ABNORMAL LOW (ref 8.9–10.3)
Chloride: 106 mmol/L (ref 101–111)
Creatinine, Ser: 3.22 mg/dL — ABNORMAL HIGH (ref 0.44–1.00)
GFR calc Af Amer: 21 mL/min — ABNORMAL LOW (ref >60)
GFR calc non Af Amer: 18 mL/min — ABNORMAL LOW (ref >60)
Glucose, Bld: 89 mg/dL (ref 70–99)
Potassium: 3.1 mmol/L — ABNORMAL LOW (ref 3.5–5.1)
Sodium: 139 mmol/L (ref 135–145)
Total Bilirubin: 0.9 mg/dL (ref 0.3–1.2)
Total Protein: 6.9 g/dL (ref 6.5–8.1)

## 2015-01-17 LAB — CBC WITH DIFFERENTIAL/PLATELET
Basophils Absolute: 0 10*3/uL (ref 0.0–0.1)
Basophils Relative: 0 % (ref 0–1)
Eosinophils Absolute: 0.1 10*3/uL (ref 0.0–0.7)
Eosinophils Relative: 1 % (ref 0–5)
HCT: 36 % (ref 36.0–46.0)
Hemoglobin: 12.3 g/dL (ref 12.0–15.0)
Lymphocytes Relative: 35 % (ref 12–46)
Lymphs Abs: 3.2 10*3/uL (ref 0.7–4.0)
MCH: 29.6 pg (ref 26.0–34.0)
MCHC: 34.2 g/dL (ref 30.0–36.0)
MCV: 86.7 fL (ref 78.0–100.0)
Monocytes Absolute: 1 10*3/uL (ref 0.1–1.0)
Monocytes Relative: 11 % (ref 3–12)
Neutro Abs: 4.7 10*3/uL (ref 1.7–7.7)
Neutrophils Relative %: 53 % (ref 43–77)
Platelets: 199 10*3/uL (ref 150–400)
RBC: 4.15 MIL/uL (ref 3.87–5.11)
RDW: 13.1 % (ref 11.5–15.5)
WBC: 9.1 10*3/uL (ref 4.0–10.5)

## 2015-01-17 MED ORDER — STERILE WATER FOR INJECTION IJ SOLN
INTRAMUSCULAR | Status: AC
  Administered 2015-01-17: 1.5 mL
  Filled 2015-01-17: qty 10

## 2015-01-17 MED ORDER — LORAZEPAM 2 MG/ML IJ SOLN
1.0000 mg | Freq: Once | INTRAMUSCULAR | Status: AC
Start: 2015-01-17 — End: 2015-01-17
  Administered 2015-01-17: 1 mg via INTRAVENOUS
  Filled 2015-01-17: qty 1

## 2015-01-17 MED ORDER — ZIPRASIDONE MESYLATE 20 MG IM SOLR
INTRAMUSCULAR | Status: AC
  Filled 2015-01-17: qty 20

## 2015-01-17 MED ORDER — LORAZEPAM 2 MG/ML IJ SOLN
2.0000 mg | Freq: Once | INTRAMUSCULAR | Status: AC
  Administered 2015-01-17: 2 mg via INTRAMUSCULAR
  Filled 2015-01-17: qty 1

## 2015-01-17 MED ORDER — ZIPRASIDONE MESYLATE 20 MG IM SOLR
10.0000 mg | Freq: Once | INTRAMUSCULAR | Status: AC
  Administered 2015-01-17: 10 mg via INTRAMUSCULAR

## 2015-01-17 MED ORDER — KETAMINE HCL 50 MG/ML IJ SOLN
2.0000 mg/kg | Freq: Once | INTRAMUSCULAR | Status: DC
  Filled 2015-01-17: qty 10

## 2015-01-17 MED ORDER — STERILE WATER FOR INJECTION IJ SOLN
INTRAMUSCULAR | Status: AC
  Administered 2015-01-17: 21:00:00
  Filled 2015-01-17: qty 10

## 2015-01-17 MED ORDER — ZIPRASIDONE MESYLATE 20 MG IM SOLR
20.0000 mg | Freq: Once | INTRAMUSCULAR | Status: AC
Start: 2015-01-17 — End: 2015-01-17
  Administered 2015-01-17: 20 mg via INTRAMUSCULAR

## 2015-01-17 MED ORDER — STERILE WATER FOR INJECTION IJ SOLN
INTRAMUSCULAR | Status: AC
  Administered 2015-01-17: 07:00:00
  Filled 2015-01-17: qty 10

## 2015-01-17 MED ORDER — ZIPRASIDONE MESYLATE 20 MG IM SOLR
10.0000 mg | Freq: Once | INTRAMUSCULAR | Status: AC
  Administered 2015-01-17: 10 mg via INTRAMUSCULAR
  Filled 2015-01-17: qty 20
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
## 2015-01-17 NOTE — ED Notes (Signed)
Pt remains in restraints, toileting offered, pulses intact, skin intact.
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
## 2015-01-17 NOTE — ED Notes (Signed)
Pt remains in four restraints with cuffs on right extremities soft restraints on left, toileting offered, pulses intact, skin intact.
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
## 2015-01-17 NOTE — ED Notes (Signed)
Unable to obtain vital signs at this time due to pt behavior.

## 2015-01-17 NOTE — ED Notes (Signed)
Spoke With Education officer, museum about current lab work. Notified that updated labs were needed for placement. Stat cbc and cmp drawn. Returned call to [facility] and updated on status of lab results. Stated that once labs were updated in chart, placement status will be re-evaluated.

## 2015-01-17 NOTE — ED Notes (Signed)
Pt remains in four restraints, toileting offered, pulses intact, skin intact.

## 2015-01-17 NOTE — ED Notes (Signed)
Pt asking for something to drink, pt given water; pt in room trying to take off paper scrubs, pt encouraged to leave pants on by hospital and [employer]

## 2015-01-17 NOTE — ED Notes (Addendum)
Pt remains in four restraints with cuffs on right extremities soft restraints on left, toileting offered, pulses intact, skin intact.

## 2015-01-17 NOTE — ED Notes (Signed)
Pt remains in four restraints with cuffs on right extremities soft restraints on left, toileting offered, pulses intact, abc's intact.

## 2015-01-17 NOTE — ED Notes (Signed)
Pt reports that she does not want to eat meal tray.

## 2015-01-17 NOTE — Progress Notes (Signed)
Writer spoke with RN Robin and informed of [place] request of faxing pt's CVC (updated labs) to [facility] fax# [phone].  Verlon Setting, [place] Disposition staff 01/17/2015 10:58 PM
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
## 2015-01-17 NOTE — ED Notes (Signed)
Pt remains in four restraints with cuffs on right extremities soft restraints on left, toileting offered, pulses intact, skin becoming red and swollen on right wrist due to pulling against cuff.  Skin has small tear, officer loosened cuff on right wrist and pt told not to pull against cuff so that it would not get any worse.  Pt verbalized understanding.

## 2015-01-17 NOTE — ED Notes (Signed)
Pt drank orange juice.

## 2015-01-17 NOTE — ED Notes (Signed)
Pt remains in four restraints with cuffs on right extremities soft restraints on left, toileting offered, pulses intact.

## 2015-01-17 NOTE — Progress Notes (Signed)
Barbara with [facility] intake called requesting information about pt's labwork. CSW directed her to call [employer] at [phone] or [phone].   Pamala Hurry advises that patients who have been administered Ketamine must have been off for 24 hours at time of transfer.* Relayed this information to [employer].   Sharren Bridge, MSW, [place] Work, Disposition  01/17/2015 [phone]

## 2015-01-17 NOTE — ED Notes (Addendum)
Pt remains in four restraints with cuffs on right extremities, toileting offered, pulses intact, skin intact.
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
## 2015-01-17 NOTE — ED Notes (Signed)
Pt remains in four restraints with cuffs on right side, toileting offered, pulses intact, skin intact.  Pt pulling against restraints.

## 2015-01-17 NOTE — ED Notes (Signed)
Pt reports she smoked "dabbing wax that was on pot."

## 2015-01-17 NOTE — ED Notes (Signed)
[place]
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
## 2015-01-17 NOTE — ED Notes (Signed)
Called pharmacy requesting atrovent.

## 2015-01-17 NOTE — ED Notes (Signed)
Joy, family friend at bedside, left number with nurse. [phone]

## 2015-01-17 NOTE — Progress Notes (Addendum)
Patient's updated labs were faxed to [facility] at fax# [phone] and received by RN Amor at [facility]. RN Robin's contact provided on the fax if pt's labs need a new update.  Barbara Rogers, [place] Disposition staff 01/17/2015 11:47 PM

## 2015-01-17 NOTE — ED Notes (Signed)
Pt given new scrub pants, pt tore off previous pair
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
## 2015-01-17 NOTE — ED Notes (Addendum)
Pt placed in restraints, toileting offered, pulses intact, skin intact.

## 2015-01-17 NOTE — ED Notes (Addendum)
Pt remains in 4 point restraints, toileting offered, pulses intact, skin intact.

## 2015-01-17 NOTE — Progress Notes (Addendum)
CSW seeking inpt placement.  Completed [place] referral form and called [facility] to obtain authorization. Provided pt's demographics for enrollment with Jonni Sanger, customer service rep, and was transferred to clinician Tim. Provided clinican information pt's history, medications, etc. Was given auth # [id number] for tx dates 01/17/15-01/23/15. Entire call took 1 hr 23 minutes. Contacted [facility] and spoke with Nine, intake, to provide pt's demographics and clinical. Relayed description of pt's aggressive behavior in ED in attempt to prioritize pt's referral. Faxed referral to [facility] awaiting their review and return call.  Faxed to: Rosana Hoes- per [facility]- per Chestine Spore at: Cristal Ford- per Lucila Maine- per Maximino Greenland- per [facility]- per Otila Kluver  On waitlist: [facility]- per [facility]  At capacity: [facility]  Left voicemail: HPR Eliot Ford, MSW, [place] Work, Disposition  01/17/2015 [phone]

## 2015-01-17 NOTE — ED Notes (Signed)
Pt remains in four restraints with cuffs on right extremities soft restraints on left, toileting offered, pulses intact, pt had assistance using bedpan and urinated, changed diaper due to menstrual cycle and repositioned pt.

## 2015-01-17 NOTE — ED Notes (Signed)
[employer] came to nurses' station and states pt is asking about her meds and why she's here; pt given her nighttime meds and informed she will have to spend the night until she is re-evaluated by TTS in the am

## 2015-01-17 NOTE — ED Notes (Addendum)
Pt still in cuffs by officer pulling against railing.  Pt warned that this is self injurious and needs to stop this behavior.  Pulses intact, skin red.

## 2015-01-17 NOTE — ED Notes (Signed)
Dr. Sabra Heck notified for needing a new order for restraints.

## 2015-01-17 NOTE — ED Notes (Signed)
Pt asleep at this time

## 2015-01-17 NOTE — ED Notes (Signed)
Pt still in cuffs by officer pulling against railing.  Pt warned that this is self injurious and needs to stop this behavior.  Pulses intact, skin red. Unable to obtain vital signs at this time.

## 2015-01-17 NOTE — ED Notes (Signed)
Updated joy, pt friend on status of pt.

## 2015-01-17 NOTE — ED Notes (Signed)
Pt still in cuffs by officer pulling against railing.  Pt warned that this is self injurious and needs to stop this behavior.  Pulses intact, skin red. Pt ate part of meal, pt encouraged to eat more.  Pt also has been drinking orange juice and water regularly.

## 2015-01-17 NOTE — ED Notes (Signed)
Pt ate all of meal tray and drank fluids with meal .

## 2015-01-17 NOTE — ED Notes (Signed)
Pt remains in four restraints with cuffs on right extremities soft restraints on left, toileting offered, pulses intact.  ABCs intact.
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
## 2015-01-18 ENCOUNTER — Encounter (HOSPITAL_COMMUNITY): Payer: Self-pay | Admitting: *Deleted

## 2015-01-18 ENCOUNTER — Emergency Department (HOSPITAL_COMMUNITY): Payer: BLUE CROSS/BLUE SHIELD

## 2015-01-18 DIAGNOSIS — Z79899 Other long term (current) drug therapy: Secondary | ICD-10-CM | POA: Diagnosis not present

## 2015-01-18 DIAGNOSIS — N390 Urinary tract infection, site not specified: Secondary | ICD-10-CM | POA: Diagnosis present

## 2015-01-18 DIAGNOSIS — F17210 Nicotine dependence, cigarettes, uncomplicated: Secondary | ICD-10-CM | POA: Diagnosis present

## 2015-01-18 DIAGNOSIS — N179 Acute kidney failure, unspecified: Secondary | ICD-10-CM

## 2015-01-18 DIAGNOSIS — F333 Major depressive disorder, recurrent, severe with psychotic symptoms: Secondary | ICD-10-CM

## 2015-01-18 DIAGNOSIS — J45909 Unspecified asthma, uncomplicated: Secondary | ICD-10-CM | POA: Diagnosis present

## 2015-01-18 DIAGNOSIS — F332 Major depressive disorder, recurrent severe without psychotic features: Secondary | ICD-10-CM | POA: Diagnosis present

## 2015-01-18 DIAGNOSIS — R443 Hallucinations, unspecified: Secondary | ICD-10-CM | POA: Diagnosis present

## 2015-01-18 DIAGNOSIS — F319 Bipolar disorder, unspecified: Secondary | ICD-10-CM | POA: Diagnosis present

## 2015-01-18 DIAGNOSIS — F1290 Cannabis use, unspecified, uncomplicated: Secondary | ICD-10-CM | POA: Diagnosis present

## 2015-01-18 DIAGNOSIS — E860 Dehydration: Secondary | ICD-10-CM | POA: Diagnosis present

## 2015-01-18 DIAGNOSIS — F19951 Other psychoactive substance use, unspecified with psychoactive substance-induced psychotic disorder with hallucinations: Secondary | ICD-10-CM | POA: Diagnosis not present

## 2015-01-18 DIAGNOSIS — N3000 Acute cystitis without hematuria: Secondary | ICD-10-CM | POA: Diagnosis not present

## 2015-01-18 DIAGNOSIS — E876 Hypokalemia: Secondary | ICD-10-CM | POA: Diagnosis present

## 2015-01-18 DIAGNOSIS — E039 Hypothyroidism, unspecified: Secondary | ICD-10-CM | POA: Diagnosis present

## 2015-01-18 DIAGNOSIS — F411 Generalized anxiety disorder: Secondary | ICD-10-CM | POA: Diagnosis present

## 2015-01-18 HISTORY — DX: Acute kidney failure, unspecified: N17.9

## 2015-01-18 LAB — CBC
HEMATOCRIT: 34.2 % — AB (ref 36.0–46.0)
Hemoglobin: 11.5 g/dL — ABNORMAL LOW (ref 12.0–15.0)
MCH: 29.6 pg (ref 26.0–34.0)
MCHC: 33.6 g/dL (ref 30.0–36.0)
MCV: 87.9 fL (ref 78.0–100.0)
Platelets: 215 10*3/uL (ref 150–400)
RBC: 3.89 MIL/uL (ref 3.87–5.11)
RDW: 13.4 % (ref 11.5–15.5)
WBC: 8.7 10*3/uL (ref 4.0–10.5)

## 2015-01-18 LAB — URINALYSIS, ROUTINE W REFLEX MICROSCOPIC
GLUCOSE, UA: NEGATIVE mg/dL
Ketones, ur: 15 mg/dL — AB
Leukocytes, UA: NEGATIVE
Nitrite: POSITIVE — AB
PROTEIN: 30 mg/dL — AB
Specific Gravity, Urine: 1.02 (ref 1.005–1.030)
Urobilinogen, UA: 0.2 mg/dL (ref 0.0–1.0)
pH: 5.5 (ref 5.0–8.0)

## 2015-01-18 LAB — PREGNANCY, URINE: Preg Test, Ur: NEGATIVE

## 2015-01-18 LAB — BASIC METABOLIC PANEL
Anion gap: 6 (ref 5–15)
Anion gap: 7 (ref 5–15)
BUN: 25 mg/dL — ABNORMAL HIGH (ref 6–20)
BUN: 22 mg/dL — AB (ref 6–20)
CALCIUM: 8.3 mg/dL — AB (ref 8.9–10.3)
CALCIUM: 8.3 mg/dL — AB (ref 8.9–10.3)
CO2: 24 mmol/L (ref 22–32)
CO2: 22 mmol/L (ref 22–32)
CREATININE: 2.7 mg/dL — AB (ref 0.44–1.00)
Chloride: 111 mmol/L (ref 101–111)
Chloride: 111 mmol/L (ref 101–111)
Creatinine, Ser: 2.62 mg/dL — ABNORMAL HIGH (ref 0.44–1.00)
GFR calc Af Amer: 26 mL/min — ABNORMAL LOW (ref >60)
GFR calc Af Amer: 26 mL/min — ABNORMAL LOW (ref >60)
GFR, EST NON AFRICAN AMERICAN: 22 mL/min — AB (ref >60)
GFR, EST NON AFRICAN AMERICAN: 23 mL/min — AB (ref >60)
GLUCOSE: 92 mg/dL (ref 70–99)
GLUCOSE: 94 mg/dL (ref 70–99)
Potassium: 3.3 mmol/L — ABNORMAL LOW (ref 3.5–5.1)
Potassium: 3.7 mmol/L (ref 3.5–5.1)
SODIUM: 140 mmol/L (ref 135–145)
Sodium: 141 mmol/L (ref 135–145)

## 2015-01-18 LAB — CREATININE, URINE, RANDOM: CREATININE, URINE: 158.35 mg/dL

## 2015-01-18 LAB — URINE MICROSCOPIC-ADD ON

## 2015-01-18 LAB — CK: Total CK: 1743 U/L — ABNORMAL HIGH (ref 38–234)

## 2015-01-18 LAB — TSH: TSH: 0.434 u[IU]/mL (ref 0.350–4.500)

## 2015-01-18 LAB — SODIUM, URINE, RANDOM: Sodium, Ur: 48 mmol/L

## 2015-01-18 MED ORDER — SODIUM CHLORIDE 0.9 % IV BOLUS (SEPSIS)
1000.0000 mL | Freq: Once | INTRAVENOUS | Status: AC
  Administered 2015-01-18: 1000 mL via INTRAVENOUS

## 2015-01-18 MED ORDER — ALUM & MAG HYDROXIDE-SIMETH 200-200-20 MG/5ML PO SUSP
30.0000 mL | Freq: Four times a day (QID) | ORAL | Status: DC | PRN
Start: 2015-01-18 — End: 2015-01-19

## 2015-01-18 MED ORDER — SODIUM CHLORIDE 0.9 % IV SOLN
INTRAVENOUS | Status: DC
  Administered 2015-01-18 – 2015-01-19 (×3): via INTRAVENOUS

## 2015-01-18 MED ORDER — DEXTROSE 5 % IV SOLN
1.0000 g | Freq: Once | INTRAVENOUS | Status: AC
  Administered 2015-01-18: 1 g via INTRAVENOUS
  Filled 2015-01-18: qty 10

## 2015-01-18 MED ORDER — SODIUM CHLORIDE 0.9 % IV SOLN
1000.0000 mL | Freq: Once | INTRAVENOUS | Status: AC
  Administered 2015-01-18: 1000 mL via INTRAVENOUS

## 2015-01-18 MED ORDER — NICOTINE 21 MG/24HR TD PT24
21.0000 mg | MEDICATED_PATCH | Freq: Every day | TRANSDERMAL | Status: DC
  Administered 2015-01-18 – 2015-01-19 (×2): 21 mg via TRANSDERMAL
  Filled 2015-01-18 (×2): qty 1

## 2015-01-18 MED ORDER — SODIUM CHLORIDE 0.9 % IV SOLN
1000.0000 mL | INTRAVENOUS | Status: DC
  Administered 2015-01-18: 1000 mL via INTRAVENOUS

## 2015-01-18 MED ORDER — ACETAMINOPHEN 325 MG PO TABS
650.0000 mg | ORAL_TABLET | Freq: Four times a day (QID) | ORAL | Status: DC | PRN
  Administered 2015-01-19 (×2): 650 mg via ORAL
  Filled 2015-01-18 (×2): qty 2

## 2015-01-18 MED ORDER — OLANZAPINE 10 MG IM SOLR
5.0000 mg | Freq: Once | INTRAMUSCULAR | Status: AC
  Administered 2015-01-18: 5 mg via INTRAMUSCULAR

## 2015-01-18 MED ORDER — CEPHALEXIN 500 MG PO CAPS: 500.0000 mg | ORAL_CAPSULE | Freq: Three times a day (TID) | ORAL | Status: DC

## 2015-01-18 MED ORDER — HYDROXYZINE HCL 25 MG PO TABS: 25.0000 mg | ORAL_TABLET | Freq: Three times a day (TID) | ORAL | Status: DC | PRN

## 2015-01-18 MED ORDER — ALPRAZOLAM 1 MG PO TABS: 1.0000 mg | ORAL_TABLET | Freq: Once | ORAL | Status: DC

## 2015-01-18 MED ORDER — ONDANSETRON HCL 4 MG/2ML IJ SOLN: 4.0000 mg | Freq: Four times a day (QID) | INTRAMUSCULAR | Status: DC | PRN

## 2015-01-18 MED ORDER — POTASSIUM CHLORIDE CRYS ER 20 MEQ PO TBCR
40.0000 meq | EXTENDED_RELEASE_TABLET | Freq: Once | ORAL | Status: AC
  Administered 2015-01-18: 40 meq via ORAL
  Filled 2015-01-18: qty 2

## 2015-01-18 MED ORDER — STERILE WATER FOR INJECTION IJ SOLN
INTRAMUSCULAR | Status: AC
Start: 2015-01-18 — End: 2015-01-18
  Filled 2015-01-18: qty 10

## 2015-01-18 MED ORDER — HEPARIN SODIUM (PORCINE) 5000 UNIT/ML IJ SOLN
5000.0000 [IU] | Freq: Three times a day (TID) | INTRAMUSCULAR | Status: DC
  Administered 2015-01-18 – 2015-01-19 (×3): 5000 [IU] via SUBCUTANEOUS
  Filled 2015-01-18 (×3): qty 1

## 2015-01-18 MED ORDER — ACETAMINOPHEN 650 MG RE SUPP: 650.0000 mg | Freq: Four times a day (QID) | RECTAL | Status: DC | PRN

## 2015-01-18 MED ORDER — OLANZAPINE 10 MG IM SOLR
10.0000 mg | Freq: Once | INTRAMUSCULAR | Status: DC
  Filled 2015-01-18: qty 10

## 2015-01-18 MED ORDER — ONDANSETRON HCL 4 MG PO TABS: 4.0000 mg | ORAL_TABLET | Freq: Four times a day (QID) | ORAL | Status: DC | PRN

## 2015-01-18 NOTE — ED Notes (Addendum)
Shackle removed from one leg. Pt. Now in 2 point restraints via handcuff to right wrist and shackle to left leg. No distress noted.

## 2015-01-18 NOTE — Progress Notes (Signed)
CSW spoke with Robinette at [facility]. She states a nephrology consult and evaluation for acute kidney injury is requested to include in referral.   Relayed [facility]'s requests for nephrology consult and AKI to [facility].   (Robinette states she has been leaving messages for social work and has also spoken with someone else regarding this request earlier this morning. To avoid lapses in communication with [facility], this writer requested [facility] use disposition social work's numbers, [phone] or [phone], from this point, so that disposition can more closely follow pt's placement.)  Will continue following up with placement efforts.  Sharren Bridge, MSW, [place] Clinical Social Work, Disposition  01/18/2015 [phone]

## 2015-01-18 NOTE — ED Notes (Signed)
Pt. Resting comfortably. No distress noted.

## 2015-01-18 NOTE — ED Notes (Signed)
Pt. Given cup of water and encouraged to drink.

## 2015-01-18 NOTE — ED Notes (Signed)
Toileting offered, pt. Denied needing bathroom at this time. Will continue to monitor.

## 2015-01-18 NOTE — ED Provider Notes (Signed)
1:49 AM BUN  Date Value Ref Range Status  01/17/2015 28* 6 - 20 mg/dL Final  01/16/2015 17 6 - 20 mg/dL Final  12/14/2014 11 6 - 23 mg/dL Final  02/12/2012 17 6 - 23 mg/dL Final   CREAT  Date Value Ref Range Status  02/12/2012 0.69 0.50 - 1.10 mg/dL Final   CREATININE, SER  Date Value Ref Range Status  01/17/2015 3.22* 0.44 - 1.00 mg/dL Final    Comment:    DELTA CHECK NOTED  01/16/2015 2.20* 0.44 - 1.00 mg/dL Final  12/14/2014 0.77 0.50 - 1.10 mg/dL Final  08/08/2011 0.60 0.50 - 1.10 mg/dL Final    Pt with worsening renal failure. Now in ER for 38 hrs. Its been reported the pt has been eating. She is still making urine. Below are the medication given in the ER. 2 liters IVFs will be given now. Still agitated. CK elevated at 1743. Will obtain [facility] consultation to assist with renal function. If not improving the pt will need to be admit. Hopefully we can manage tx in the ER and disposition to the appropriate facility which is a psychiatric hospital  I appreciate the assistance of Dr Darrick Meigs and the [facility] team  Medications  potassium chloride SA (K-DUR,KLOR-CON) CR tablet 40 mEq (40 mEq Oral Not Given 01/16/15 1713)  LORazepam (ATIVAN) tablet 1 mg (1 mg Oral Given 01/17/15 0123)  albuterol (PROVENTIL HFA;VENTOLIN HFA) 108 (90 BASE) MCG/ACT inhaler 2 puff (not administered)  ALPRAZolam (XANAX) tablet 1-2 mg (2 mg Oral Given 01/17/15 2035)  amphetamine-dextroamphetamine (ADDERALL) tablet 20 mg (20 mg Oral Given 01/18/15 0135)  Asenapine Maleate SUBL 10 mg (10 mg Sublingual Not Given 01/17/15 2022)  cyclobenzaprine (FLEXERIL) tablet 10 mg (not administered)  polycarbophil (FIBERCON) tablet 1,250 mg (1,250 mg Oral Given 01/17/15 1200)  HYDROcodone-acetaminophen (NORCO) 10-325 MG per tablet 1 tablet (1 tablet Oral Given 01/17/15 0123)  ipratropium (ATROVENT) 0.06 % nasal spray 2 spray (2 sprays Each Nare Not Given 01/17/15 2022)  levothyroxine (SYNTHROID, LEVOTHROID) tablet  50 mcg (50 mcg Oral Given 01/17/15 1200)  multivitamin with minerals tablet 1 tablet (1 tablet Oral Given 01/17/15 1159)  phentermine (ADIPEX-P) tablet 37.5 mg (37.5 mg Oral Not Given 01/17/15 1514)  pregabalin (LYRICA) capsule 200 mg (200 mg Oral Not Given 01/17/15 2022)  promethazine (PHENERGAN) tablet 25 mg (not administered)  QUEtiapine (SEROQUEL) tablet 400 mg (400 mg Oral Given 01/18/15 0135)  zolpidem (AMBIEN) tablet 10 mg (10 mg Oral Given 01/17/15 2036)  ketamine (KETALAR) injection 145 mg (145 mg Intramuscular Not Given 01/17/15 1717)  0.9 %  sodium chloride infusion (1,000 mLs Intravenous New Bag/Given 01/18/15 0127)    Followed by  0.9 %  sodium chloride infusion (not administered)    Followed by  0.9 %  sodium chloride infusion (not administered)  sterile water (preservative free) injection (not administered)  LORazepam (ATIVAN) tablet 1 mg (1 mg Oral Given 01/16/15 1251)  ziprasidone (GEODON) injection 10 mg (10 mg Intramuscular Given 01/16/15 1337)  sterile water (preservative free) injection (10 mLs  Given 01/16/15 1459)  ziprasidone (GEODON) 20 MG injection (20 mg  Given 01/16/15 1459)  sterile water (preservative free) injection (10 mLs  Given 01/16/15 1459)  ziprasidone (GEODON) injection 20 mg ( Intramuscular Duplicate 2/84/13 2440)  LORazepam (ATIVAN) injection 2 mg (2 mg Intramuscular Given 01/17/15 0648)  sterile water (preservative free) injection (  Given 01/17/15 0649)  LORazepam (ATIVAN) injection 2 mg (2 mg Intramuscular Given 01/17/15 1025)  ziprasidone (GEODON)  injection 10 mg (10 mg Intramuscular Given 01/17/15 1712)  LORazepam (ATIVAN) injection 1 mg (1 mg Intravenous Given 01/17/15 1711)  sterile water (preservative free) injection (1.5 mLs  Given 01/17/15 1712)  ziprasidone (GEODON) injection 10 mg (10 mg Intramuscular Given 01/17/15 2100)  sterile water (preservative free) injection (  Given 01/17/15 2100)  OLANZapine (ZYPREXA) injection 5 mg (5 mg Intramuscular Given 01/18/15  0146)      Jola Schmidt, MD 01/18/15 [phone]

## 2015-01-18 NOTE — ED Provider Notes (Signed)
Urine culture ordered. ropcehin now. Renal US in AM. Scheduled pt for additional 4 days of keflex after rocephin.  This can be stopped after urine culture returns. Hospitalist following  Jola Schmidt, MD 01/18/15 [phone]

## 2015-01-18 NOTE — H&P (Signed)
[facility] History and Physical  ROGELIO WINBUSH EVO:[id number] DOB: May 16, 1981 DOA: 01/16/2015  Referring physician:  PCP: JEFFERY,[name], PA-C   Chief Complaint: Hallucinations  HPI: Barbara Rogers is a 34 y.o. female with a past history of major depression, generalized anxiety disorder, who presented to the emergency department [facility] on 01/16/2015 with mental status changes, having visual and auditory hallucinations. Patient found to be psychotic in the emergency department, was placed an involuntary commitment and was awaiting placement to inpatient psychiatric facility. Labs performed on 01/17/2015 revealed a creatinine of 3.22 for which medicine was consulted. She was started on IV fluid resuscitation and further workup with bilateral renal ultrasound which did not reveal evidence of hydronephrosis. On the following day her crit had improved to 2.62. Emergency room provider requesting admission to the medicine service to further address renal failure prior to discharging patient to inpatient psychiatric facility.                                                                 Review of Systems:  Constitutional:  No weight loss, night sweats, Fevers, chills, fatigue.  HEENT:  No headaches, Difficulty swallowing,Tooth/dental problems,Sore throat,  No sneezing, itching, ear ache, nasal congestion, post nasal drip,  Cardio-vascular:  No chest pain, Orthopnea, PND, swelling in lower extremities, anasarca, dizziness, palpitations  GI:  No heartburn, indigestion, abdominal pain, nausea, vomiting, diarrhea, change in bowel habits, loss of appetite  Resp:  No shortness of breath with exertion or at rest. No excess mucus, no productive cough, No non-productive cough, No coughing up of blood.No change in color of mucus.No wheezing.No chest wall deformity  Skin:  no rash or lesions.  GU:  no dysuria, change in color of urine, no urgency or frequency. No flank pain.    Musculoskeletal:  No joint pain or swelling. No decreased range of motion. No back pain.  Psych:  Positive for hallucinations, having anxiety, depression.   Past Medical History  Diagnosis Date  . Allergy   . Migraine without aura   . Anxiety   . Depression   . Dysmenorrhea   . PMDD (premenstrual dysphoric disorder)   . Insomnia   . Nephrolithiasis   . Finger fracture, left 10/31/2012    LEFT 4th finger  . Chronic left shoulder pain 08/2012  . Asthma     exacerbated by bronchitisi   History reviewed. No pertinent past surgical history. Social History:  reports that she has been smoking Cigarettes.  She has been smoking about 1.00 pack per day. She has never used smokeless tobacco. She reports that she uses illicit drugs (Marijuana). She reports that she does not drink alcohol.  Allergies  Allergen Reactions  . Penicillins     unknown  . Progestins     Extreme moodiness    Family History  Problem Relation Age of Onset  . Migraines Mother   . Mental illness Mother   . Heart disease Father   . Alcohol abuse Father   . Arthritis Maternal Grandmother   . Thyroid disease Maternal Grandmother   . Heart disease Maternal Grandfather     AMI 1996, 2014  . Anemia Maternal Grandfather     bone marrow dysfunction    Prior to Admission medications  Medication Sig Start Date End Date Taking? Authorizing Provider  albuterol (PROVENTIL HFA;VENTOLIN HFA) 108 (90 BASE) MCG/ACT inhaler Inhale 2 puffs into the lungs every 4 (four) hours as needed for wheezing (cough, shortness of breath or wheezing.). 08/24/14 01/06/16 Yes [name] Dellis Filbert, PA-C  ALPRAZolam Duanne Moron) 1 MG tablet Take 1-2 mg by mouth at bedtime as needed for sleep.    Yes Historical Provider, MD  amphetamine-dextroamphetamine (ADDERALL) 20 MG tablet Take 20 mg by mouth 3 (three) times daily.   Yes Historical Provider, MD  Asenapine Maleate (SAPHRIS) 10 MG SUBL Place 10 mg under the tongue 2 (two) times daily.    Yes  Historical Provider, MD  cyclobenzaprine (FLEXERIL) 10 MG tablet Take 1 tablet (10 mg total) by mouth 3 (three) times daily as needed for muscle spasms. 01/10/15  Yes [name] Dellis Filbert, PA-C  doxepin (SINEQUAN) 50 MG capsule Take 50-100 mg by mouth at bedtime.   Yes Historical Provider, MD  FIBER DIET PO Take 1 tablet by mouth daily.    Yes Historical Provider, MD  HYDROcodone-acetaminophen (NORCO) 10-325 MG per tablet Take 1 tablet by mouth every 6 (six) hours as needed for moderate pain or severe pain. 01/10/15  Yes [name] Dellis Filbert, PA-C  hydrOXYzine (ATARAX/VISTARIL) 50 MG tablet Take 50-100 mg by mouth 3 (three) times daily as needed.   Yes Historical Provider, MD  levothyroxine (SYNTHROID, LEVOTHROID) 50 MCG tablet TAKE 1 TABLET (50 MCG TOTAL) BY MOUTH DAILY. 08/23/14  Yes Daphane Shepherd, PA-C  Multiple Vitamins-Minerals (MULTIVITAMINS THER. W/MINERALS) TABS Take 1 tablet by mouth daily.    Yes Historical Provider, MD  phentermine (ADIPEX-P) 37.5 MG tablet Take 1 tablet by mouth daily. 01/04/14  Yes Historical Provider, MD  pregabalin (LYRICA) 200 MG capsule Take 1 capsule (200 mg total) by mouth 3 (three) times daily. 09/12/14  Yes [name] Dellis Filbert, PA-C  promethazine (PHENERGAN) 25 MG tablet Take 1 tablet (25 mg total) by mouth every 8 (eight) hours as needed for nausea or vomiting. 12/21/14  Yes [name] Dellis Filbert, PA-C  QUEtiapine (SEROQUEL) 400 MG tablet Take 800 mg by mouth at bedtime.    Yes Historical Provider, MD  zolpidem (AMBIEN) 10 MG tablet Take 10 mg by mouth at bedtime as needed for sleep.    Yes Historical Provider, MD   Physical Exam: Filed Vitals:   01/17/15 1511 01/17/15 1700 01/18/15 0015 01/18/15 0634  BP: 141/83 106/78 141/122   Pulse: 103 97 84 72  Temp: 99.4 F (37.4 C) 98.7 F (37.1 C)    TempSrc:  Oral    Resp: 25 20 17 14   Height:      Weight:      SpO2: 94% 93% 98% 100%    Wt Readings from Last 3 Encounters:  01/16/15 72.576 kg (160 lb)  12/14/14 74.844 kg (165 lb)    08/19/14 77.111 kg (170 lb)    General:  Appears tearful, depressed, anxious Eyes: PERRL, normal lids, irises & conjunctiva ENT: grossly normal hearing, lips & tongue Neck: no LAD, masses or thyromegaly Cardiovascular: RRR, no m/r/g. No LE edema. Telemetry: SR, no arrhythmias  Respiratory: CTA bilaterally, no w/r/r. Normal respiratory effort. Abdomen: soft, ntnd Skin: no rash or induration seen on limited exam Musculoskeletal: grossly normal tone BUE/BLE Psychiatric: grossly normal mood and affect, speech fluent and appropriate Neurologic: grossly non-focal.          Labs on Admission:  Basic Metabolic Panel:  Recent Labs Lab 01/16/15 1141 01/17/15 2300 01/18/15 0719 01/18/15 1438  NA 137  139 141 140  K 2.8* 3.1* 3.3* 3.7  CL 103 106 111 111  CO2 22 22 24 22   GLUCOSE 126* 89 92 94  BUN 17 28* 25* 22*  CREATININE 2.20* 3.22* 2.70* 2.62*  CALCIUM 9.2 8.8* 8.3* 8.3*   Liver Function Tests:  Recent Labs Lab 01/16/15 1141 01/17/15 2300  AST 17 33  ALT 13* 16  ALKPHOS 85 79  BILITOT 0.7 0.9  PROT 6.8 6.9  ALBUMIN 4.3 4.0   No results for input(s): LIPASE, AMYLASE in the last 168 hours. No results for input(s): AMMONIA in the last 168 hours. CBC:  Recent Labs Lab 01/16/15 1141 01/17/15 2300  WBC 9.8 9.1  NEUTROABS 7.0 4.7  HGB 12.5 12.3  HCT 35.6* 36.0  MCV 86.4 86.7  PLT 201 199   Cardiac Enzymes:  Recent Labs Lab 01/17/15 2253  CKTOTAL 1743*    BNP (last 3 results) No results for input(s): BNP in the last 8760 hours.  ProBNP (last 3 results) No results for input(s): PROBNP in the last 8760 hours.  CBG: No results for input(s): GLUCAP in the last 168 hours.  Radiological Exams on Admission: US Renal  01/18/2015   CLINICAL DATA:  Acute kidney injury.  EXAM: RENAL / URINARY TRACT ULTRASOUND COMPLETE  COMPARISON:  CT, 01/06/2013.  FINDINGS: Right Kidney:  Length: 14.1 cm. Increased parenchymal echogenicity. No mass or stone. No  hydronephrosis.  Left Kidney:  Length: 14.9 cm. Increased parenchymal echogenicity. No mass or stone. No hydronephrosis.  Bladder:  Appears normal for degree of bladder distention.  IMPRESSION: 1. Generalized increased renal sizes and increased parenchymal echogenicity consistent with medical renal disease. No hydronephrosis.   Electronically Signed   By: Lajean Manes M.D.   On: 01/18/2015 08:54    EKG: Independently reviewed.   Assessment/Plan Active Problems:   AKI (acute kidney injury)   Hypokalemia   1. Acute kidney injury. Patient initially presenting to the emergency room with psychosis, labs revealing presence of acute kidney injury, having a creatinine of 3.22 on 01/17/2015. Looking back at records she had a creatinine of 0.77 on 12/14/2014. She was further worked up with renal U/S that did not show evidence for hydronephrosis. Her creatinine trending down to 2.7 after the administration of IV fluids overnight, making an argument for prerenal azotemia. Will continue IV fluids overnight and repeat labs in am.  2. Hypokalemia. I suspect secondary to dehydration, potassium improved with PO replacement. Potassium improving to 3.7 from 2.8 on 01/16/2015.  3. Hypothyroidism. Will check a TSH, meanwhile continue synthroid at 50 mcg PO q daily    Code Status: Full Code DVT Prophylaxis: Heparin Disposition Plan: Will admit to the medicine service.   Time spent: 55 min  Kelvin Cellar [facility] Pager [phone]

## 2015-01-18 NOTE — ED Notes (Signed)
Officer informed RN that pt. Arm placed back in cuff. Pt. Was attempting to remove other cuff and pt. Behavior unable to be redirected. Will continue to monitor pt.

## 2015-01-18 NOTE — ED Notes (Addendum)
Patient stating lower back conts. To hurt and she feels very anxious. Notified Dr. Roderic Palau and gave verbal order to give ativan 1mg  po, but no further pain meds.  Nyra Capes, RN

## 2015-01-18 NOTE — Consult Note (Signed)
PCP:   JEFFERY,[name], PA-C   Chief Complaint:  Worsening renal failure  HPI:  34 year old female who  has a past medical history of Allergy; Migraine without aura; Anxiety; Depression; Dysmenorrhea; PMDD (premenstrual dysphoric disorder); Insomnia; Nephrolithiasis; Finger fracture, left (10/31/2012); Chronic left shoulder pain (08/2012); and Asthma. Patient presented to the ED on 01/16/2015 with altered mental status, manic episode and is currently committed in the ED for transfer to inpatient psych. Called by ED physician Dr. Venora Maples, that patient's renal functions have worsened over past 38 hours and her BMP shows BUN/creatinine of 28/3.22, which is up from 17/2.20 on 01/16/2015. Patient is a very poor historian, unable to provide significant history due to her altered mental status. As per nursing staff patient did have good by mouth intake over the past 38 hrs. patient has not agreed to get IV fluids, 2 little bolus of normal saline have been given. Patient also found to have elevated CK of 1743 check done 01/17/2015 at 2253.  Allergies:   Allergies  Allergen Reactions  . Penicillins     unknown  . Progestins     Extreme moodiness      Past Medical History  Diagnosis Date  . Allergy   . Migraine without aura   . Anxiety   . Depression   . Dysmenorrhea   . PMDD (premenstrual dysphoric disorder)   . Insomnia   . Nephrolithiasis   . Finger fracture, left 10/31/2012    LEFT 4th finger  . Chronic left shoulder pain 08/2012  . Asthma     exacerbated by bronchitisi    History reviewed. No pertinent past surgical history.  Prior to Admission medications   Medication Sig Start Date End Date Taking? Authorizing Provider  albuterol (PROVENTIL HFA;VENTOLIN HFA) 108 (90 BASE) MCG/ACT inhaler Inhale 2 puffs into the lungs every 4 (four) hours as needed for wheezing (cough, shortness of breath or wheezing.). 08/24/14 01/06/16 Yes Pa-C, PA-C  ALPRAZolam (XANAX) 1 MG tablet Take 1-2  mg by mouth at bedtime as needed for sleep.    Yes Historical Provider, MD  amphetamine-dextroamphetamine (ADDERALL) 20 MG tablet Take 20 mg by mouth 3 (three) times daily.   Yes Historical Provider, MD  Asenapine Maleate (SAPHRIS) 10 MG SUBL Place 10 mg under the tongue 2 (two) times daily.    Yes Historical Provider, MD  cyclobenzaprine (FLEXERIL) 10 MG tablet Take 1 tablet (10 mg total) by mouth 3 (three) times daily as needed for muscle spasms. 01/10/15  Yes Pa-C, PA-C  doxepin (SINEQUAN) 50 MG capsule Take 50-100 mg by mouth at bedtime.   Yes Historical Provider, MD  FIBER DIET PO Take 1 tablet by mouth daily.    Yes Historical Provider, MD  HYDROcodone-acetaminophen (NORCO) 10-325 MG per tablet Take 1 tablet by mouth every 6 (six) hours as needed for moderate pain or severe pain. 01/10/15  Yes Pa-C, PA-C  hydrOXYzine (ATARAX/VISTARIL) 50 MG tablet Take 50-100 mg by mouth 3 (three) times daily as needed.   Yes Historical Provider, MD  levothyroxine (SYNTHROID, LEVOTHROID) 50 MCG tablet TAKE 1 TABLET (50 MCG TOTAL) BY MOUTH DAILY. 08/23/14  Yes Pa-C, PA-C  Multiple Vitamins-Minerals (MULTIVITAMINS THER. W/MINERALS) TABS Take 1 tablet by mouth daily.    Yes Historical Provider, MD  phentermine (ADIPEX-P) 37.5 MG tablet Take 1 tablet by mouth daily. 01/04/14  Yes Historical Provider, MD  pregabalin (LYRICA) 200 MG capsule Take 1 capsule (200 mg total) by mouth 3 (three) times daily. 09/12/14  Yes Pa-C,  PA-C  promethazine (PHENERGAN) 25 MG tablet Take 1 tablet (25 mg total) by mouth every 8 (eight) hours as needed for nausea or vomiting. 12/21/14  Yes Pa-C, PA-C  QUEtiapine (SEROQUEL) 400 MG tablet Take 800 mg by mouth at bedtime.    Yes Historical Provider, MD  zolpidem (AMBIEN) 10 MG tablet Take 10 mg by mouth at bedtime as needed for sleep.    Yes Historical Provider, MD    Social History:  reports that she has been smoking Cigarettes.  She has been smoking about 1.00 pack per day. She has never used  smokeless tobacco. She reports that she uses illicit drugs (Marijuana). She reports that she does not drink alcohol.  Family History  Problem Relation Age of Onset  . Migraines Mother   . Mental illness Mother   . Heart disease Father   . Alcohol abuse Father   . Arthritis Maternal Grandmother   . Thyroid disease Maternal Grandmother   . Heart disease Maternal Grandfather     AMI 1996, 2014  . Anemia Maternal Grandfather     bone marrow dysfunction       Review of Systems:  Could not be obtained due to patient's altered mental status   Physical Exam: Blood pressure 141/122, pulse 84, temperature 98.7 F (37.1 C), temperature source Oral, resp. rate 17, height 5\' 9"  (1.753 m), weight 72.576 kg (160 lb), last menstrual period 12/17/2014, SpO2 98 %. Constitutional:   Patient is currently restrained, having pressured speech and flight of ideas Head: Normocephalic and atraumatic Mouth: Mucus membranes moist Eyes: PERRL, EOMI, conjunctivae normal Neck: Supple, No Thyromegaly Cardiovascular: RRR, S1 normal, S2 normal Pulmonary/Chest: CTAB, no wheezes, rales, or rhonchi Abdominal: Soft. Non-tender, non-distended, bowel sounds are normal, no masses, organomegaly, or guarding present.  Neurological: Alert, confused , moving all extremities  Extremities : No Cyanosis, Clubbing or Edema  Labs on Admission:  Basic Metabolic Panel:  Recent Labs Lab 01/16/15 1141 01/17/15 2300  NA 137 139  K 2.8* 3.1*  CL 103 106  CO2 22 22  GLUCOSE 126* 89  BUN 17 28*  CREATININE 2.20* 3.22*  CALCIUM 9.2 8.8*   Liver Function Tests:  Recent Labs Lab 01/16/15 1141 01/17/15 2300  AST 17 33  ALT 13* 16  ALKPHOS 85 79  BILITOT 0.7 0.9  PROT 6.8 6.9  ALBUMIN 4.3 4.0   No results for input(s): LIPASE, AMYLASE in the last 168 hours. No results for input(s): AMMONIA in the last 168 hours. CBC:  Recent Labs Lab 01/16/15 1141 01/17/15 2300  WBC 9.8 9.1  NEUTROABS 7.0 4.7  HGB 12.5  12.3  HCT 35.6* 36.0  MCV 86.4 86.7  PLT 201 199   Cardiac Enzymes:  Recent Labs Lab 01/17/15 2253  CKTOTAL 1743*      Assessment/Plan  Acute kidney injury Hypokalemia Bipolar disorder  Agree with starting IV fluids, after total dose of normal saline continue at 125 MR per hour. Will obtain urine sodium and creatinine. We'll also check renal ultrasound to rule out underlying obstruction. Repeat BMP in a.m. if renal functions not improving consider nephrology consultation. Will replace potassium 40 mg by mouth 1. Recheck potassium in a.m.  Will follow the patient with you    Time Spent on Admission: 55 min  [facility] Pager: [phone] 01/18/2015, 2:37 AM  If 7PM-7AM, please contact night-coverage  [email or web address]  Password [email or web address]

## 2015-01-18 NOTE — ED Notes (Signed)
Pt ambulated to bathroom with assistance. Pt calm, oriented. Pt cleaned off and new scrubs given to pt. Pt asking what happened yesterday. Pt apologizing for behavior. Pt assisted back to clean bed. Officer at bedside. Pt not in shackles at present.

## 2015-01-18 NOTE — ED Notes (Signed)
Pt. Resting in bed. No distress noted.

## 2015-01-18 NOTE — ED Notes (Signed)
Pt. Resting with eyes closed. Even rise and fall of chest noted. Breathing easy and unlabored.

## 2015-01-18 NOTE — ED Notes (Signed)
Pt. Resting with eyes closed, no distress noted. Even rise and fall of chest noted.

## 2015-01-18 NOTE — ED Notes (Signed)
Spirit given to patient

## 2015-01-18 NOTE — ED Notes (Signed)
Attempted to call report to floor. Di Kindle, RN to call back

## 2015-01-18 NOTE — ED Notes (Signed)
Pt. Resting in bed with Sheriff at bedside. Pt. Cuffed to bed. No acute distress noted. Even rise and fall of chest noted. Pt. Mumbling incomprehensible speech.

## 2015-01-18 NOTE — ED Notes (Signed)
Spoke with IT consultant, cuff to be removed from one arm and one leg at this time. Pt. Given water and is drinking water at this time. Pt. With flight of ideas and speaking incoherently.

## 2015-01-18 NOTE — ED Notes (Signed)
Gave patient water and Spirit to drink

## 2015-01-18 NOTE — ED Notes (Signed)
Handcuff removed from left wrist.

## 2015-01-18 NOTE — ED Notes (Signed)
Pt. Cooperative for IV placement.

## 2015-01-18 NOTE — ED Notes (Signed)
Spoke with officer, cuff removed from arm. Pt. Now in one point restraint via cuff to left leg. Pt. Resting. Pt. Given warm blanket. Pt. Calm and cooperative at this time.

## 2015-01-18 NOTE — ED Notes (Signed)
Redness noted to pts. Bilateral wrists. Pt. Wrists wrapped in coban to help protect from irritation from handcuffs. Pt. Jerking wrists, pt. Instructed to stop self-injurious behavior. Pt. Tearing shirt and attempting to eat it, pt. Behavior redirected.

## 2015-01-18 NOTE — Progress Notes (Signed)
Spoke with [facility] intake who is requesting "documentation that potassium and creatnine levels are being addressed." Spoke with [facility] RN regarding this request. CSW faxed most recent labs available to [facility]. Will continue following this placement.  Sharren Bridge, MSW, [place] Clinical Social Work, Disposition  01/18/2015 [phone]

## 2015-01-18 NOTE — ED Notes (Signed)
Pt continues to be cooperative, no restraints in use. Pt up to bathroom with assistance.

## 2015-01-18 NOTE — ED Notes (Signed)
Spoke to [place], states due to rise of creatinine. Pt not medically clear. Provider ordered repeat BMP.  Nyra Capes, RN

## 2015-01-19 ENCOUNTER — Encounter (HOSPITAL_COMMUNITY): Payer: Self-pay | Admitting: *Deleted

## 2015-01-19 ENCOUNTER — Inpatient Hospital Stay (HOSPITAL_COMMUNITY)
Admission: AD | Admit: 2015-01-19 | Discharge: 2015-01-21 | DRG: 885 | Disposition: A | Discharge status: Home / Self Care | Payer: BLUE CROSS/BLUE SHIELD | Source: Intra-hospital | Attending: Psychiatry | Admitting: Psychiatry

## 2015-01-19 ENCOUNTER — Encounter (HOSPITAL_COMMUNITY): Payer: Self-pay | Admitting: Registered Nurse

## 2015-01-19 DIAGNOSIS — F333 Major depressive disorder, recurrent, severe with psychotic symptoms: Secondary | ICD-10-CM

## 2015-01-19 DIAGNOSIS — F329 Major depressive disorder, single episode, unspecified: Secondary | ICD-10-CM | POA: Diagnosis present

## 2015-01-19 DIAGNOSIS — E876 Hypokalemia: Secondary | ICD-10-CM | POA: Diagnosis present

## 2015-01-19 DIAGNOSIS — N179 Acute kidney failure, unspecified: Secondary | ICD-10-CM | POA: Diagnosis present

## 2015-01-19 DIAGNOSIS — N3000 Acute cystitis without hematuria: Secondary | ICD-10-CM | POA: Diagnosis not present

## 2015-01-19 DIAGNOSIS — F19951 Other psychoactive substance use, unspecified with psychoactive substance-induced psychotic disorder with hallucinations: Secondary | ICD-10-CM | POA: Diagnosis present

## 2015-01-19 HISTORY — DX: Major depressive disorder, recurrent, severe with psychotic symptoms: F33.3

## 2015-01-19 LAB — URINE CULTURE

## 2015-01-19 LAB — CBC
HEMATOCRIT: 31.2 % — AB (ref 36.0–46.0)
Hemoglobin: 10.6 g/dL — ABNORMAL LOW (ref 12.0–15.0)
MCH: 30 pg (ref 26.0–34.0)
MCHC: 34 g/dL (ref 30.0–36.0)
MCV: 88.4 fL (ref 78.0–100.0)
PLATELETS: 172 10*3/uL (ref 150–400)
RBC: 3.53 MIL/uL — AB (ref 3.87–5.11)
RDW: 13.4 % (ref 11.5–15.5)
WBC: 6.6 10*3/uL (ref 4.0–10.5)

## 2015-01-19 LAB — BASIC METABOLIC PANEL
Anion gap: 5 (ref 5–15)
BUN: 19 mg/dL (ref 6–20)
CALCIUM: 8.1 mg/dL — AB (ref 8.9–10.3)
CO2: 22 mmol/L (ref 22–32)
CREATININE: 2.19 mg/dL — AB (ref 0.44–1.00)
Chloride: 115 mmol/L — ABNORMAL HIGH (ref 101–111)
GFR calc Af Amer: 33 mL/min — ABNORMAL LOW (ref >60)
GFR, EST NON AFRICAN AMERICAN: 28 mL/min — AB (ref >60)
Glucose, Bld: 98 mg/dL (ref 65–99)
Potassium: 3.5 mmol/L (ref 3.5–5.1)
SODIUM: 142 mmol/L (ref 135–145)

## 2015-01-19 MED ORDER — CEPHALEXIN 500 MG PO CAPS: 500.0000 mg | ORAL_CAPSULE | Freq: Three times a day (TID) | ORAL | Status: DC

## 2015-01-19 MED ORDER — POTASSIUM CHLORIDE CRYS ER 20 MEQ PO TBCR
40.0000 meq | EXTENDED_RELEASE_TABLET | Freq: Once | ORAL | Status: AC
  Administered 2015-01-19: 40 meq via ORAL
  Filled 2015-01-19: qty 2

## 2015-01-19 MED ORDER — CEPHALEXIN 500 MG PO CAPS
500.0000 mg | ORAL_CAPSULE | Freq: Three times a day (TID) | ORAL | Status: DC
  Administered 2015-01-19: 500 mg via ORAL
  Filled 2015-01-19: qty 1

## 2015-01-19 NOTE — Discharge Summary (Addendum)
Physician Discharge Summary  Barbara Rogers:[id number] DOB: 02/01/1981 DOA: 01/16/2015  PCP: JEFFERY,[name], PA-C  Admit date: 01/16/2015 Discharge date: 01/19/2015  Time spent: 35 minutes  Recommendations for Outpatient Follow-up:  1. Please follow up on repeat BMP in 1 day, patient treated for AKI 2. Keflex 500 mg PO TID x 2 days for UTI  Discharge Diagnoses:  Principal Problem:   MDD (major depressive disorder), recurrent, severe, with psychosis Active Problems:   AKI (acute kidney injury)   Hypokalemia   Discharge Condition: Stable  Diet recommendation: Regular diet  Filed Weights   01/16/15 1101  Weight: 72.576 kg (160 lb)    History of present illness:  Barbara Rogers is a 34 y.o. female with a past history of major depression, generalized anxiety disorder, who presented to the emergency department [facility] on 01/16/2015 with mental status changes, having visual and auditory hallucinations. Patient found to be psychotic in the emergency department, was placed an involuntary commitment and was awaiting placement to inpatient psychiatric facility. Labs performed on 01/17/2015 revealed a creatinine of 3.22 for which medicine was consulted. She was started on IV fluid resuscitation and further workup with bilateral renal ultrasound which did not reveal evidence of hydronephrosis. On the following day her crit had improved to 2.62. Emergency room provider requesting admission to the medicine service to further address renal failure prior to discharging patient to inpatient psychiatric facility  Hospital Course:  Patient is a 34 year old female with past medical history of major depression and generalized anxiety disorder admitted to the medicine service on 01/18/2015. Initially she presented to the emergency department with psychosis, place under involuntary commitment as she had been awaiting placement to inpatient psychiatric facility. Labs however revealed presence of  acute kidney injury having a creatinine of 3.22. This responded to IV fluid resuscitation as her creatinine trended down to 2.19 on 01/19/2015. [facility]. She was transferred on 01/19/2015 in stable condition.    Discharge Exam: Filed Vitals:   01/19/15 1602  BP: 144/92  Pulse: 74  Temp: 98.4 F (36.9 C)  Resp: 20     General: Patient sleeping however arousable. Seemed oversedated.  Cardiovascular: Regular rate and rhythm normal S1-S2 no murmurs rubs or gallops  Respiratory: Clear to auscultation bilaterally no wheezing rhonchi or rales  Abdomen: Soft nontender nondistended  Musculoskeletal: No edema  Discharge Instructions   Discharge Instructions    Call MD for:  difficulty breathing, headache or visual disturbances    Complete by:  As directed      Call MD for:  extreme fatigue    Complete by:  As directed      Call MD for:  hives    Complete by:  As directed      Call MD for:  persistant dizziness or light-headedness    Complete by:  As directed      Call MD for:  persistant nausea and vomiting    Complete by:  As directed      Call MD for:  redness, tenderness, or signs of infection (pain, swelling, redness, odor or green/yellow discharge around incision site)    Complete by:  As directed      Call MD for:  severe uncontrolled pain    Complete by:  As directed      Call MD for:  temperature >100.4    Complete by:  As directed      Diet - low sodium heart healthy  Complete by:  As directed      Diet - low sodium heart healthy    Complete by:  As directed      Increase activity slowly    Complete by:  As directed      Increase activity slowly    Complete by:  As directed           Current Discharge Medication List    START taking these medications   Details  cephALEXin (KEFLEX) 500 MG capsule Take 1 capsule (500 mg total) by mouth every 8 (eight) hours. Qty: 9 capsule, Refills: 0      CONTINUE these medications  which have NOT CHANGED   Details  albuterol (PROVENTIL HFA;VENTOLIN HFA) 108 (90 BASE) MCG/ACT inhaler Inhale 2 puffs into the lungs every 4 (four) hours as needed for wheezing (cough, shortness of breath or wheezing.). Qty: 1 Inhaler, Refills: 1   Associated Diagnoses: RAD (reactive airway disease), unspecified asthma severity, with acute exacerbation    levothyroxine (SYNTHROID, LEVOTHROID) 50 MCG tablet TAKE 1 TABLET (50 MCG TOTAL) BY MOUTH DAILY. Qty: 90 tablet, Refills: 1    Multiple Vitamins-Minerals (MULTIVITAMINS THER. W/MINERALS) TABS Take 1 tablet by mouth daily.       STOP taking these medications     ALPRAZolam (XANAX) 1 MG tablet      amphetamine-dextroamphetamine (ADDERALL) 20 MG tablet      Asenapine Maleate (SAPHRIS) 10 MG SUBL      cyclobenzaprine (FLEXERIL) 10 MG tablet      doxepin (SINEQUAN) 50 MG capsule      FIBER DIET PO      HYDROcodone-acetaminophen (NORCO) 10-325 MG per tablet      hydrOXYzine (ATARAX/VISTARIL) 50 MG tablet      phentermine (ADIPEX-P) 37.5 MG tablet      pregabalin (LYRICA) 200 MG capsule      promethazine (PHENERGAN) 25 MG tablet      QUEtiapine (SEROQUEL) 400 MG tablet      zolpidem (AMBIEN) 10 MG tablet        Allergies  Allergen Reactions  . Penicillins     unknown  . Progestins     Extreme moodiness      The results of significant diagnostics from this hospitalization (including imaging, microbiology, ancillary and laboratory) are listed below for reference.    Significant Diagnostic Studies: US Renal  01/18/2015   CLINICAL DATA:  Acute kidney injury.  EXAM: RENAL / URINARY TRACT ULTRASOUND COMPLETE  COMPARISON:  CT, 01/06/2013.  FINDINGS: Right Kidney:  Length: 14.1 cm. Increased parenchymal echogenicity. No mass or stone. No hydronephrosis.  Left Kidney:  Length: 14.9 cm. Increased parenchymal echogenicity. No mass or stone. No hydronephrosis.  Bladder:  Appears normal for degree of bladder distention.   IMPRESSION: 1. Generalized increased renal sizes and increased parenchymal echogenicity consistent with medical renal disease. No hydronephrosis.   Electronically Signed   By: Lajean Manes M.D.   On: 01/18/2015 08:54    Microbiology: Recent Results (from the past 240 hour(s))  Urine culture     Status: None   Collection Time: 01/18/15  3:15 AM  Result Value Ref Range Status   Specimen Description URINE, CLEAN CATCH  Final   Special Requests NONE  Final   Colony Count   Final    8,000 COLONIES/ML Performed at [facility]    Culture   Final    INSIGNIFICANT GROWTH Performed at [facility]    Report Status 01/19/2015 FINAL  Final  Labs: Basic Metabolic Panel:  Recent Labs Lab 01/16/15 1141 01/17/15 2300 01/18/15 0719 01/18/15 1438 01/19/15 0731  NA 137 139 141 140 142  K 2.8* 3.1* 3.3* 3.7 3.5  CL 103 106 111 111 115*  CO2 22 22 24 22 22   GLUCOSE 126* 89 92 94 98  BUN 17 28* 25* 22* 19  CREATININE 2.20* 3.22* 2.70* 2.62* 2.19*  CALCIUM 9.2 8.8* 8.3* 8.3* 8.1*   Liver Function Tests:  Recent Labs Lab 01/16/15 1141 01/17/15 2300  AST 17 33  ALT 13* 16  ALKPHOS 85 79  BILITOT 0.7 0.9  PROT 6.8 6.9  ALBUMIN 4.3 4.0   No results for input(s): LIPASE, AMYLASE in the last 168 hours. No results for input(s): AMMONIA in the last 168 hours. CBC:  Recent Labs Lab 01/16/15 1141 01/17/15 2300 01/18/15 1843 01/19/15 0731  WBC 9.8 9.1 8.7 6.6  NEUTROABS 7.0 4.7  --   --   HGB 12.5 12.3 11.5* 10.6*  HCT 35.6* 36.0 34.2* 31.2*  MCV 86.4 86.7 87.9 88.4  PLT 201 199 215 172   Cardiac Enzymes:  Recent Labs Lab 01/17/15 2253  CKTOTAL 1743*   BNP: BNP (last 3 results) No results for input(s): BNP in the last 8760 hours.  ProBNP (last 3 results) No results for input(s): PROBNP in the last 8760 hours.  CBG: No results for input(s): GLUCAP in the last 168 hours.     SignedKelvin Cellar  [facility] 01/19/2015, 5:56  PM

## 2015-01-19 NOTE — Care Management Note (Signed)
Case Management Note  Patient Details  Name: Barbara Rogers MRN: [id number] Date of Birth: 1981-03-15  Subjective/Objective:                  Pt admitted from home with AKI and psychosis. Pt lives with significant other. Psych is recommending inpt psych.  Action/Plan: Will continue to follow for discharge planning needs.  Expected Discharge Date:  01/20/15               Expected Discharge Plan:  [facility]  In-House Referral:  Clinical Social Work  Discharge planning Services  CM Consult  Post Acute Care Choice:  NA Choice offered to:  NA  DME Arranged:    DME Agency:     HH Arranged:    HH Agency:     Status of Service:  In process, will continue to follow  Medicare Important Message Given:    Date Medicare IM Given:    Medicare IM give by:    Date Additional Medicare IM Given:    Additional Medicare Important Message give by:     If discussed at [place] of Stay Meetings, dates discussed:    Additional Comments:  Joylene Draft, RN 01/19/2015, 1:28 PM

## 2015-01-19 NOTE — Progress Notes (Signed)
[facility] PROGRESS NOTE  JNAYA BUTRICK CZY:[id number] DOB: [birth year] DOA: 01/16/2015 PCP: JEFFERY,[name], PA-C  Assessment/Plan: 1. Acute kidney injury -Initial lab work revealed a creatinine of 3.22 on 01/17/2015 responding to IV fluid resuscitation as creatinine trended down to 2.19 by 01/19/2015, making argument for prerenal azotemia as etiology of renal failure. Bilateral renal ultrasound did not show evidence for postobstructive nephropathy. -Will continue IV fluid resuscitation, repeat labs in a.m.  2.  Hypokalemia. -Likely related to dehydration, had potassium of 2.8 on 01/16/2015, improved to 3.7 on 01/18/2015. This morning potassium trending down to 3.5, will give 1 dose of oral potassium replacement.   3.  Psychosis -Patient place under involuntary commitment. Have requested telemetry psych consultation for a reevaluation.  4.  Urinary tract infection. -Urinalysis showed the presence of many bacteria, positive nitrates, initially given IV ceftriaxone, will transition to oral Keflex  Code Status: Full code Family Communication: Family not present Disposition Plan:    Consultants: Psychiatry   HPI/Subjective: Patient is a 34 year old female with past medical history of major depression and generalized anxiety disorder admitted to the medicine service on 01/18/2015. Initially she presented to the emergency department with psychosis, place under involuntary commitment as she had been awaiting placement to inpatient psychiatric facility. Labs however revealed presence of acute kidney injury having a creatinine of 3.22. This responded to IV fluid resuscitation as her creatinine trended down to 2.19 on 01/19/2015.   Objective: Filed Vitals:   01/19/15 1602  BP: 144/92  Pulse: 74  Temp: 98.4 F (36.9 C)  Resp: 20    Intake/Output Summary (Last 24 hours) at 01/19/15 1634 Last data filed at 01/19/15 1300  Gross per 24 hour  Intake 2182.5 ml  Output      0 ml  Net  2182.5 ml   Filed Weights   01/16/15 1101  Weight: 72.576 kg (160 lb)    Exam:   General:  Patient sleeping however arousable. Seemed oversedated.  Cardiovascular: Regular rate and rhythm normal S1-S2 no murmurs rubs or gallops  Respiratory: Clear to auscultation bilaterally no wheezing rhonchi or rales  Abdomen: Soft nontender nondistended  Musculoskeletal: No edema  Data Reviewed: Basic Metabolic Panel:  Recent Labs Lab 01/16/15 1141 01/17/15 2300 01/18/15 0719 01/18/15 1438 01/19/15 0731  NA 137 139 141 140 142  K 2.8* 3.1* 3.3* 3.7 3.5  CL 103 106 111 111 115*  CO2 22 22 24 22 22   GLUCOSE 126* 89 92 94 98  BUN 17 28* 25* 22* 19  CREATININE 2.20* 3.22* 2.70* 2.62* 2.19*  CALCIUM 9.2 8.8* 8.3* 8.3* 8.1*   Liver Function Tests:  Recent Labs Lab 01/16/15 1141 01/17/15 2300  AST 17 33  ALT 13* 16  ALKPHOS 85 79  BILITOT 0.7 0.9  PROT 6.8 6.9  ALBUMIN 4.3 4.0   No results for input(s): LIPASE, AMYLASE in the last 168 hours. No results for input(s): AMMONIA in the last 168 hours. CBC:  Recent Labs Lab 01/16/15 1141 01/17/15 2300 01/18/15 1843 01/19/15 0731  WBC 9.8 9.1 8.7 6.6  NEUTROABS 7.0 4.7  --   --   HGB 12.5 12.3 11.5* 10.6*  HCT 35.6* 36.0 34.2* 31.2*  MCV 86.4 86.7 87.9 88.4  PLT 201 199 215 172   Cardiac Enzymes:  Recent Labs Lab 01/17/15 2253  CKTOTAL 1743*   BNP (last 3 results) No results for input(s): BNP in the last 8760 hours.  ProBNP (last 3 results) No results for input(s): PROBNP in the last  8760 hours.  CBG: No results for input(s): GLUCAP in the last 168 hours.  Recent Results (from the past 240 hour(s))  Urine culture     Status: None   Collection Time: 01/18/15  3:15 AM  Result Value Ref Range Status   Specimen Description URINE, CLEAN CATCH  Final   Special Requests NONE  Final   Colony Count   Final    8,000 COLONIES/ML Performed at [facility]    Culture   Final    INSIGNIFICANT  GROWTH Performed at [facility]    Report Status 01/19/2015 FINAL  Final     Studies: US Renal  01/18/2015   CLINICAL DATA:  Acute kidney injury.  EXAM: RENAL / URINARY TRACT ULTRASOUND COMPLETE  COMPARISON:  CT, 01/06/2013.  FINDINGS: Right Kidney:  Length: 14.1 cm. Increased parenchymal echogenicity. No mass or stone. No hydronephrosis.  Left Kidney:  Length: 14.9 cm. Increased parenchymal echogenicity. No mass or stone. No hydronephrosis.  Bladder:  Appears normal for degree of bladder distention.  IMPRESSION: 1. Generalized increased renal sizes and increased parenchymal echogenicity consistent with medical renal disease. No hydronephrosis.   Electronically Signed   By: Lajean Manes M.D.   On: 01/18/2015 08:54    Scheduled Meds: . heparin  5,000 Units Subcutaneous 3 times per day  . levothyroxine  50 mcg Oral QAC breakfast  . nicotine  21 mg Transdermal Daily  . pregabalin  200 mg Oral TID  . QUEtiapine  400 mg Oral QHS   Continuous Infusions: . sodium chloride 1,000 mL (01/18/15 1800)  . sodium chloride 150 mL/hr at 01/19/15 1020    Active Problems:   AKI (acute kidney injury)   Hypokalemia    Time spent: 25 min    Kelvin Cellar  [facility] Pager [phone]. If 7PM-7AM, please contact night-coverage at [email or web address], password [facility] 01/19/2015, 4:34 PM  LOS: 1 day

## 2015-01-19 NOTE — Progress Notes (Signed)
UR chart review completed.  

## 2015-01-19 NOTE — Progress Notes (Signed)
Patient has been accepted at [facility] to Dr. Parke Poisson, bed 404-2. Pt can come after 7:30pm  RN Di Kindle informed.  Verlon Setting, [place] Disposition staff 01/19/2015 5:38 PM

## 2015-01-19 NOTE — Tx Team (Signed)
Initial Interdisciplinary Treatment Plan   PATIENT STRESSORS: Health problems Loss of Grandparents Medication change or noncompliance Substance abuse   PATIENT STRENGTHS: Ability for insight Active sense of humor Communication skills Supportive family/friends Work skills   PROBLEM LIST: Problem List/Patient Goals Date to be addressed Date deferred Reason deferred Estimated date of resolution  Depression "learn about antidepressants" 01/19/15   At d/c  anxiety 01/19/15   At d/c  Medication issues 01/19/15   At d/c  Increased risk for suicidal Ideation 01/19/15   At d/c                                 DISCHARGE CRITERIA:  Improved stabilization in mood, thinking, and/or behavior Motivation to continue treatment in a less acute level of care Need for constant or close observation no longer present Verbal commitment to aftercare and medication compliance  PRELIMINARY DISCHARGE PLAN: Outpatient therapy Return to previous living arrangement Return to previous work or school arrangements  PATIENT/FAMIILY INVOLVEMENT: This treatment plan has been presented to and reviewed with the patient, Barbara Rogers.  The patient and family have been given the opportunity to ask questions and make suggestions.  Apolinar Junes 01/19/2015, 11:17 PM

## 2015-01-19 NOTE — Progress Notes (Signed)
Report called to nurse at [facility]. Sheriff's department will be transporting pt from our facility to [facility] IV removed and pt was sent with all of her belongings

## 2015-01-19 NOTE — Progress Notes (Signed)
Pt presents to [facility] alert, anxious, tremulous but cooperative. Pt presented to [facility] via EMS. EMS reported pt was hallucinating, fiancee called due to pt being confused and incoherent. Pt reports her grandparents recently died (08-26-2014 and 11-25-14) and she has been depressed. "I was started on a new medication and it caused me to hallucinate for a few days". "I don't remember what happened, they said I was fighting and spitting". Pt currently denies SI/HI/A/V/H, verbally contracts for safety. Pt denies any history of suicide attempts or prior inpatient admissions. Reports her mother committed suicide 13 years ago. Pt reports taking xanax 4x day for the last 3 years "I need my xanax they just took me off, my hands shake when I don't take". "I don't need help, I don't abuse drugs". Pt c/o abdmoninal cramping, presently on menses and has a UTI, BP elevated, receiving RN notified. Emotional support and encouragement given. Pt admitted for evaluation, stabilization and reduction of baseline. Will monitor closely.

## 2015-01-19 NOTE — Consult Note (Signed)
Telepsych Consultation   Reason for Consult:  Complaints of depression and hallucinations Referring Physician:  [facility] Patient Identification: Barbara Rogers MRN:  [id number] Principal Diagnosis: MDD (major depressive disorder), recurrent, severe, with psychosis Diagnosis:   Patient Active Problem List   Diagnosis Date Noted  . MDD (major depressive disorder), recurrent, severe, with psychosis [F33.3] 01/19/2015  . AKI (acute kidney injury) [N17.9] 01/18/2015  . Hypokalemia [E87.6] 01/18/2015  . Migraine without aura [G43.009]   . IUD (intrauterine device) in place [Z97.5] 06/02/2013  . Hypothyroid [E03.9] 12/09/2012  . Finger fracture, left [S62.609A]   . Chronic left shoulder pain [M25.512, G89.29]   . Mood disorder [F39] 03/25/2012  . AR (allergic rhinitis) [J30.9] 03/25/2012  . Dysmenorrhea [N94.6] 03/25/2012  . PMDD (premenstrual dysphoric disorder) [N94.3] 03/25/2012  . Chronic insomnia [G47.00] 03/25/2012  . Migraine [346]     Total Time spent with patient: 1 hour  Subjective:   Barbara Rogers is a 34 y.o. female.  HPI:  Patient states that she can't remember what happen 3-4 days ago.  States that she is unaware if she has taken an overdose.  Patient states that she has had several medication changes and feels that it is the reason for the hallucinations (visual and auditory).  Patient has a history of Major Depressive Disorder and sees Dr. Toy Care outpatient (medication management).  Patient has recently lost several family members and that has caused a worsening in depression.   Discussed with patient that recommendation was for her to come to [place] for inpatient treatment and medication adjustment.  HPI Elements:   Location:  Worsening depression. Quality:  Overdose. Severity:  Sever. Duration:  Several days.  Past Medical History:  Past Medical History  Diagnosis Date  . Allergy   . Migraine without aura   . Anxiety   . Depression   . Dysmenorrhea   . PMDD  (premenstrual dysphoric disorder)   . Insomnia   . Nephrolithiasis   . Finger fracture, left 10/31/2012    LEFT 4th finger  . Chronic left shoulder pain 08/2012  . Asthma     exacerbated by bronchitisi   History reviewed. No pertinent past surgical history. Family History:  Family History  Problem Relation Age of Onset  . Migraines Mother   . Mental illness Mother   . Heart disease Father   . Alcohol abuse Father   . Arthritis Maternal Grandmother   . Thyroid disease Maternal Grandmother   . Heart disease Maternal Grandfather     AMI 1996, 2014  . Anemia Maternal Grandfather     bone marrow dysfunction   Social History:  History  Alcohol Use No     History  Drug Use  . Yes  . Special: Marijuana    History   Social History  . Marital Status: Single    Spouse Name: N/A  . Number of Children: 0  . Years of Education: 12   Occupational History  . [employer]  .      [employer]   Social History Main Topics  . Smoking status: Current Some Day Smoker -- 1.00 packs/day    Types: Cigarettes  . Smokeless tobacco: Never Used     Comment: using e-cig to try to quit  . Alcohol Use: No  . Drug Use: Yes    Special: Marijuana  . Sexual Activity:    Partners: Male    Patent examiner Protection: IUD   Other Topics Concern  . None  Social History Narrative   Lives with her boyfriend.   Additional Social History:    Pain Medications: pt denies abuse - reports compliance w/ lidocaine patches Prescriptions: pt denies abuse Over the Counter: pt denies abuse History of alcohol / drug use?: Yes Name of Substance 1: marijuana 1 - Age of First Use: 18 1 - Amount (size/oz): less than one gram 1 - Frequency: twice monthly  1 - Duration: off and on 1 - Last Use / Amount: unknown  Allergies:   Allergies  Allergen Reactions  . Penicillins     unknown  . Progestins     Extreme moodiness    Labs:  Results for orders placed or performed during the hospital encounter of  01/16/15 (from the past 48 hour(s))  CK     Status: Abnormal   Collection Time: 01/17/15 10:53 PM  Result Value Ref Range   Total CK 1743 (H) 38 - 234 U/L  Comprehensive metabolic panel     Status: Abnormal   Collection Time: 01/17/15 11:00 PM  Result Value Ref Range   Sodium 139 135 - 145 mmol/L   Potassium 3.1 (L) 3.5 - 5.1 mmol/L   Chloride 106 101 - 111 mmol/L   CO2 22 22 - 32 mmol/L   Glucose, Bld 89 70 - 99 mg/dL   BUN 28 (H) 6 - 20 mg/dL   Creatinine, Ser 3.22 (H) 0.44 - 1.00 mg/dL    Comment: DELTA CHECK NOTED   Calcium 8.8 (L) 8.9 - 10.3 mg/dL   Total Protein 6.9 6.5 - 8.1 g/dL   Albumin 4.0 3.5 - 5.0 g/dL   AST 33 15 - 41 U/L   ALT 16 14 - 54 U/L   Alkaline Phosphatase 79 38 - 126 U/L   Total Bilirubin 0.9 0.3 - 1.2 mg/dL   GFR calc non Af Amer 18 (L) >60 mL/min   GFR calc Af Amer 21 (L) >60 mL/min    Comment: (NOTE) The eGFR has been calculated using the CKD EPI equation. This calculation has not been validated in all clinical situations. eGFR's persistently <60 mL/min signify possible Chronic Kidney Disease.    Anion gap 11 5 - 15  CBC with Differential     Status: None   Collection Time: 01/17/15 11:00 PM  Result Value Ref Range   WBC 9.1 4.0 - 10.5 K/uL   RBC 4.15 3.87 - 5.11 MIL/uL   Hemoglobin 12.3 12.0 - 15.0 g/dL   HCT 36.0 36.0 - 46.0 %   MCV 86.7 78.0 - 100.0 fL   MCH 29.6 26.0 - 34.0 pg   MCHC 34.2 30.0 - 36.0 g/dL   RDW 13.1 11.5 - 15.5 %   Platelets 199 150 - 400 K/uL   Neutrophils Relative % 53 43 - 77 %   Neutro Abs 4.7 1.7 - 7.7 K/uL   Lymphocytes Relative 35 12 - 46 %   Lymphs Abs 3.2 0.7 - 4.0 K/uL   Monocytes Relative 11 3 - 12 %   Monocytes Absolute 1.0 0.1 - 1.0 K/uL   Eosinophils Relative 1 0 - 5 %   Eosinophils Absolute 0.1 0.0 - 0.7 K/uL   Basophils Relative 0 0 - 1 %   Basophils Absolute 0.0 0.0 - 0.1 K/uL  Urinalysis, Routine w reflex microscopic     Status: Abnormal   Collection Time: 01/18/15  2:15 AM  Result Value Ref  Range   Color, Urine AMBER (A) YELLOW    Comment: BIOCHEMICALS MAY  BE AFFECTED BY COLOR   APPearance CLEAR CLEAR   Specific Gravity, Urine 1.020 1.005 - 1.030   pH 5.5 5.0 - 8.0   Glucose, UA NEGATIVE NEGATIVE mg/dL   Hgb urine dipstick LARGE (A) NEGATIVE   Bilirubin Urine SMALL (A) NEGATIVE   Ketones, ur 15 (A) NEGATIVE mg/dL   Protein, ur 30 (A) NEGATIVE mg/dL   Urobilinogen, UA 0.2 0.0 - 1.0 mg/dL   Nitrite POSITIVE (A) NEGATIVE   Leukocytes, UA NEGATIVE NEGATIVE  Pregnancy, urine     Status: None   Collection Time: 01/18/15  2:15 AM  Result Value Ref Range   Preg Test, Ur NEGATIVE NEGATIVE    Comment:        THE SENSITIVITY OF THIS METHODOLOGY IS >20 mIU/mL.   Urine microscopic-add on     Status: Abnormal   Collection Time: 01/18/15  2:15 AM  Result Value Ref Range   Squamous Epithelial / LPF MANY (A) RARE   WBC, UA 0-2 <3 WBC/hpf   RBC / HPF TOO NUMEROUS TO COUNT <3 RBC/hpf   Bacteria, UA MANY (A) RARE  Sodium, urine, random     Status: None   Collection Time: 01/18/15  2:15 AM  Result Value Ref Range   Sodium, Ur 48 mmol/L  Creatinine, urine, random     Status: None   Collection Time: 01/18/15  2:15 AM  Result Value Ref Range   Creatinine, Urine 158.35 mg/dL  Urine culture     Status: None   Collection Time: 01/18/15  3:15 AM  Result Value Ref Range   Specimen Description URINE, CLEAN CATCH    Special Requests NONE    Colony Count      8,000 COLONIES/ML Performed at [facility]    Culture      INSIGNIFICANT GROWTH Performed at [facility]    Report Status 01/19/2015 FINAL   Basic metabolic panel     Status: Abnormal   Collection Time: 01/18/15  7:19 AM  Result Value Ref Range   Sodium 141 135 - 145 mmol/L   Potassium 3.3 (L) 3.5 - 5.1 mmol/L   Chloride 111 101 - 111 mmol/L   CO2 24 22 - 32 mmol/L   Glucose, Bld 92 70 - 99 mg/dL   BUN 25 (H) 6 - 20 mg/dL   Creatinine, Ser 2.70 (H) 0.44 - 1.00 mg/dL   Calcium 8.3 (L) 8.9 - 10.3 mg/dL    GFR calc non Af Amer 22 (L) >60 mL/min   GFR calc Af Amer 26 (L) >60 mL/min    Comment: (NOTE) The eGFR has been calculated using the CKD EPI equation. This calculation has not been validated in all clinical situations. eGFR's persistently <60 mL/min signify possible Chronic Kidney Disease.    Anion gap 6 5 - 15  Basic metabolic panel     Status: Abnormal   Collection Time: 01/18/15  2:38 PM  Result Value Ref Range   Sodium 140 135 - 145 mmol/L   Potassium 3.7 3.5 - 5.1 mmol/L   Chloride 111 101 - 111 mmol/L   CO2 22 22 - 32 mmol/L   Glucose, Bld 94 70 - 99 mg/dL   BUN 22 (H) 6 - 20 mg/dL   Creatinine, Ser 2.62 (H) 0.44 - 1.00 mg/dL   Calcium 8.3 (L) 8.9 - 10.3 mg/dL   GFR calc non Af Amer 23 (L) >60 mL/min   GFR calc Af Amer 26 (L) >60 mL/min    Comment: (NOTE)  The eGFR has been calculated using the CKD EPI equation. This calculation has not been validated in all clinical situations. eGFR's persistently <60 mL/min signify possible Chronic Kidney Disease.    Anion gap 7 5 - 15  CBC     Status: Abnormal   Collection Time: 01/18/15  6:43 PM  Result Value Ref Range   WBC 8.7 4.0 - 10.5 K/uL   RBC 3.89 3.87 - 5.11 MIL/uL   Hemoglobin 11.5 (L) 12.0 - 15.0 g/dL   HCT 34.2 (L) 36.0 - 46.0 %   MCV 87.9 78.0 - 100.0 fL   MCH 29.6 26.0 - 34.0 pg   MCHC 33.6 30.0 - 36.0 g/dL   RDW 13.4 11.5 - 15.5 %   Platelets 215 150 - 400 K/uL  TSH     Status: None   Collection Time: 01/18/15  6:43 PM  Result Value Ref Range   TSH 0.434 0.350 - 4.500 uIU/mL  Basic metabolic panel     Status: Abnormal   Collection Time: 01/19/15  7:31 AM  Result Value Ref Range   Sodium 142 135 - 145 mmol/L   Potassium 3.5 3.5 - 5.1 mmol/L   Chloride 115 (H) 101 - 111 mmol/L   CO2 22 22 - 32 mmol/L   Glucose, Bld 98 65 - 99 mg/dL   BUN 19 6 - 20 mg/dL   Creatinine, Ser 2.19 (H) 0.44 - 1.00 mg/dL   Calcium 8.1 (L) 8.9 - 10.3 mg/dL   GFR calc non Af Amer 28 (L) >60 mL/min   GFR calc Af Amer 33 (L) >60  mL/min    Comment: (NOTE) The eGFR has been calculated using the CKD EPI equation. This calculation has not been validated in all clinical situations. eGFR's persistently <60 mL/min signify possible Chronic Kidney Disease.    Anion gap 5 5 - 15  CBC     Status: Abnormal   Collection Time: 01/19/15  7:31 AM  Result Value Ref Range   WBC 6.6 4.0 - 10.5 K/uL   RBC 3.53 (L) 3.87 - 5.11 MIL/uL   Hemoglobin 10.6 (L) 12.0 - 15.0 g/dL   HCT 31.2 (L) 36.0 - 46.0 %   MCV 88.4 78.0 - 100.0 fL   MCH 30.0 26.0 - 34.0 pg   MCHC 34.0 30.0 - 36.0 g/dL   RDW 13.4 11.5 - 15.5 %   Platelets 172 150 - 400 K/uL    Vitals: Blood pressure 144/92, pulse 74, temperature 98.4 F (36.9 C), temperature source Oral, resp. rate 20, height '5\' 9"'  (1.753 m), weight 72.576 kg (160 lb), last menstrual period 01/15/2015, SpO2 99 %.  Risk to Self: Suicidal Ideation: No Suicidal Intent: No Is patient at risk for suicide?: No Suicidal Plan?: No Access to Means: No What has been your use of drugs/alcohol within the last 12 months?: pt reports twice monthly THC use How many times?: 0 Other Self Harm Risks: none Triggers for Past Attempts:  (n/a) Intentional Self Injurious Behavior: None Risk to Others: Homicidal Ideation: No Thoughts of Harm to Others: No Current Homicidal Intent: No Current Homicidal Plan: No Access to Homicidal Means: No Identified Victim: none History of harm to others?: No Assessment of Violence: None Noted Violent Behavior Description: pt denies Does patient have access to weapons?: No Criminal Charges Pending?: No Does patient have a court date: No Prior Inpatient Therapy: Prior Inpatient Therapy: No Prior Therapy Dates: na Prior Therapy Facilty/Provider(s): na Reason for Treatment: na Prior Outpatient Therapy: Prior Outpatient  Therapy: No Prior Therapy Dates: na Prior Therapy Facilty/Provider(s): na Reason for Treatment: na Does patient have an ACCT team?: Unknown Does patient  have Intensive In-House Services?  : No Does patient have [facility] services? : Unknown Does patient have P4CC services?: Unknown  Current Facility-Administered Medications  Medication Dose Route Frequency Provider Last Rate Last Dose  . 0.9 %  sodium chloride infusion  1,000 mL Intravenous Continuous Jola Schmidt, MD 125 mL/hr at 01/18/15 1800 1,000 mL at 01/18/15 1800  . 0.9 %  sodium chloride infusion   Intravenous Continuous Kelvin Cellar, MD 150 mL/hr at 01/19/15 1020    . acetaminophen (TYLENOL) tablet 650 mg  650 mg Oral Q6H PRN Kelvin Cellar, MD   650 mg at 01/19/15 1526   Or  . acetaminophen (TYLENOL) suppository 650 mg  650 mg Rectal Q6H PRN Kelvin Cellar, MD      . alum & mag hydroxide-simeth (MAALOX/MYLANTA) 200-200-20 MG/5ML suspension 30 mL  30 mL Oral Q6H PRN Kelvin Cellar, MD      . cephALEXin (KEFLEX) capsule 500 mg  500 mg Oral 3 times per day Kelvin Cellar, MD      . heparin injection 5,000 Units  5,000 Units Subcutaneous 3 times per day Kelvin Cellar, MD   5,000 Units at 01/19/15 1453  . hydrOXYzine (ATARAX/VISTARIL) tablet 25 mg  25 mg Oral TID PRN Kelvin Cellar, MD      . levothyroxine (SYNTHROID, LEVOTHROID) tablet 50 mcg  50 mcg Oral QAC breakfast Milton Ferguson, MD   50 mcg at 01/19/15 1017  . LORazepam (ATIVAN) tablet 1 mg  1 mg Oral Q8H PRN Milton Ferguson, MD   1 mg at 01/19/15 1457  . nicotine (NICODERM CQ - dosed in mg/24 hours) patch 21 mg  21 mg Transdermal Daily Jeryl Columbia, NP   21 mg at 01/18/15 2159  . ondansetron (ZOFRAN) tablet 4 mg  4 mg Oral Q6H PRN Kelvin Cellar, MD       Or  . ondansetron (ZOFRAN) injection 4 mg  4 mg Intravenous Q6H PRN Kelvin Cellar, MD      . potassium chloride SA (K-DUR,KLOR-CON) CR tablet 40 mEq  40 mEq Oral Once Kelvin Cellar, MD      . pregabalin (LYRICA) capsule 200 mg  200 mg Oral TID Milton Ferguson, MD   200 mg at 01/19/15 1053  . QUEtiapine (SEROQUEL) tablet 400 mg  400 mg Oral QHS Milton Ferguson, MD    400 mg at 01/18/15 2159    Musculoskeletal: Strength & Muscle Tone: within normal limits Gait & Station: normal Patient leans: N/A  Psychiatric Specialty Exam:     Blood pressure 144/92, pulse 74, temperature 98.4 F (36.9 C), temperature source Oral, resp. rate 20, height '5\' 9"'  (8.786 m), weight 72.576 kg (160 lb), last menstrual period 01/15/2015, SpO2 99 %.Body mass index is 23.62 kg/(m^2).  General Appearance: Casual  Eye Contact::  Good  Speech:  Clear and Coherent and Normal Rate  Volume:  Normal  Mood:  Angry, Anxious, Depressed and Irritable  Affect:  Depressed and Tearful  Thought Process:  Linear  Orientation:  Full (Time, Place, and Person)  Thought Content:  Rumination  Suicidal Thoughts:  Overdose; Denies suicidal ideation at this time  Homicidal Thoughts:  No  Memory:  Immediate;   Poor Recent;   Poor Remote;   Fair  Judgement:  Fair  Insight:  Lacking  Psychomotor Activity:  Normal  Concentration:  Fair  Recall:  Poor  Fund of Knowledge:Fair  Language: Good  Akathisia:  Negative  Handed:  Right  AIMS (if indicated):     Assets:  Communication Skills Social Support  ADL's:  Intact  Cognition: WNL  Sleep:      Medical Decision Making: Established Problem, Stable/Improving (1), Review of Psycho-Social Stressors (1), Review or order clinical lab tests (1), Review and summation of old records (2), Independent Review of image, tracing or specimen (2) and Review of Medication Regimen & Side Effects (2)   Treatment Plan Summary: Daily contact with patient to assess and evaluate symptoms and progress in treatment and Medication management  Plan:  Inpatient treatment Disposition: Admit to [facility] ([address]) for inpatient treatment for Major Depressive Disorder, recurrent with psychotic features  Rankin, Shuvon, FNP-BC 01/19/2015 5:17 PM  I have been consulted about this patient and agree with the assessment and plan Geralyn Flash A. [place].D.

## 2015-01-20 ENCOUNTER — Encounter (HOSPITAL_COMMUNITY): Payer: Self-pay | Admitting: Registered Nurse

## 2015-01-20 DIAGNOSIS — F19951 Other psychoactive substance use, unspecified with psychoactive substance-induced psychotic disorder with hallucinations: Secondary | ICD-10-CM

## 2015-01-20 HISTORY — DX: Other psychoactive substance use, unspecified with psychoactive substance-induced psychotic disorder with hallucinations: F19.951

## 2015-01-20 MED ORDER — LORAZEPAM 1 MG PO TABS: 1.0000 mg | ORAL_TABLET | Freq: Two times a day (BID) | ORAL | Status: DC

## 2015-01-20 MED ORDER — LORAZEPAM 1 MG PO TABS: 1.0000 mg | ORAL_TABLET | Freq: Every day | ORAL | Status: DC

## 2015-01-20 MED ORDER — NICOTINE 21 MG/24HR TD PT24: 21.0000 mg | MEDICATED_PATCH | Freq: Every day | TRANSDERMAL | Status: DC

## 2015-01-20 MED ORDER — LEVOTHYROXINE SODIUM 50 MCG PO TABS
50.0000 ug | ORAL_TABLET | Freq: Every day | ORAL | Status: DC
  Administered 2015-01-20 – 2015-01-21 (×2): 50 ug via ORAL
  Filled 2015-01-20 (×2): qty 1
  Filled 2015-01-20: qty 2
  Filled 2015-01-20 (×2): qty 1

## 2015-01-20 MED ORDER — VITAMIN B-1 100 MG PO TABS
100.0000 mg | ORAL_TABLET | Freq: Every day | ORAL | Status: DC
  Administered 2015-01-21: 100 mg via ORAL
  Filled 2015-01-20 (×3): qty 1

## 2015-01-20 MED ORDER — LORAZEPAM 1 MG PO TABS
1.0000 mg | ORAL_TABLET | Freq: Four times a day (QID) | ORAL | Status: DC | PRN
  Administered 2015-01-20 (×2): 1 mg via ORAL
  Filled 2015-01-20 (×2): qty 1

## 2015-01-20 MED ORDER — MAGNESIUM CITRATE PO SOLN
1.0000 | Freq: Once | ORAL | Status: AC
  Administered 2015-01-20: 1 via ORAL

## 2015-01-20 MED ORDER — LOPERAMIDE HCL 2 MG PO CAPS: 2.0000 mg | ORAL_CAPSULE | ORAL | Status: DC | PRN

## 2015-01-20 MED ORDER — HYDROXYZINE HCL 25 MG PO TABS
25.0000 mg | ORAL_TABLET | Freq: Four times a day (QID) | ORAL | Status: DC | PRN
  Filled 2015-01-20: qty 6

## 2015-01-20 MED ORDER — CEPHALEXIN 500 MG PO CAPS
500.0000 mg | ORAL_CAPSULE | Freq: Three times a day (TID) | ORAL | Status: DC
  Administered 2015-01-20 (×2): 500 mg via ORAL
  Filled 2015-01-20 (×6): qty 1
  Filled 2015-01-20: qty 2
  Filled 2015-01-20 (×2): qty 1

## 2015-01-20 MED ORDER — MAGNESIUM HYDROXIDE 400 MG/5ML PO SUSP: 30.0000 mL | Freq: Every day | ORAL | Status: DC | PRN

## 2015-01-20 MED ORDER — QUETIAPINE FUMARATE 400 MG PO TABS
400.0000 mg | ORAL_TABLET | Freq: Every day | ORAL | Status: DC
  Administered 2015-01-20 (×2): 400 mg via ORAL
  Filled 2015-01-20: qty 1
  Filled 2015-01-20: qty 2
  Filled 2015-01-20: qty 1
  Filled 2015-01-20 (×2): qty 3
  Filled 2015-01-20: qty 1

## 2015-01-20 MED ORDER — THIAMINE HCL 100 MG/ML IJ SOLN
100.0000 mg | Freq: Once | INTRAMUSCULAR | Status: DC
Start: 2015-01-20 — End: 2015-01-21

## 2015-01-20 MED ORDER — ADULT MULTIVITAMIN W/MINERALS CH
1.0000 | ORAL_TABLET | Freq: Every day | ORAL | Status: DC
  Administered 2015-01-20 – 2015-01-21 (×2): 1 via ORAL
  Filled 2015-01-20 (×5): qty 1

## 2015-01-20 MED ORDER — PREGABALIN 100 MG PO CAPS
200.0000 mg | ORAL_CAPSULE | Freq: Three times a day (TID) | ORAL | Status: DC
  Administered 2015-01-20 – 2015-01-21 (×5): 200 mg via ORAL
  Filled 2015-01-20 (×5): qty 2

## 2015-01-20 MED ORDER — LORAZEPAM 1 MG PO TABS
1.0000 mg | ORAL_TABLET | Freq: Four times a day (QID) | ORAL | Status: AC
  Administered 2015-01-20 (×4): 1 mg via ORAL
  Filled 2015-01-20 (×4): qty 1

## 2015-01-20 MED ORDER — LORAZEPAM 1 MG PO TABS
1.0000 mg | ORAL_TABLET | Freq: Three times a day (TID) | ORAL | Status: DC
  Administered 2015-01-21: 1 mg via ORAL
  Filled 2015-01-20: qty 1

## 2015-01-20 MED ORDER — ONDANSETRON 4 MG PO TBDP: 4.0000 mg | ORAL_TABLET | Freq: Four times a day (QID) | ORAL | Status: DC | PRN

## 2015-01-20 MED ORDER — NICOTINE 21 MG/24HR TD PT24
21.0000 mg | MEDICATED_PATCH | Freq: Every day | TRANSDERMAL | Status: DC
Start: 2015-01-20 — End: 2015-01-21
  Administered 2015-01-20 – 2015-01-21 (×2): 21 mg via TRANSDERMAL
  Filled 2015-01-20 (×5): qty 1

## 2015-01-20 MED ORDER — CEPHALEXIN 500 MG PO CAPS
500.0000 mg | ORAL_CAPSULE | Freq: Two times a day (BID) | ORAL | Status: DC
  Administered 2015-01-21: 500 mg via ORAL
  Filled 2015-01-20: qty 1
  Filled 2015-01-20: qty 9
  Filled 2015-01-20: qty 1
  Filled 2015-01-20: qty 9
  Filled 2015-01-20: qty 1
  Filled 2015-01-20 (×2): qty 9

## 2015-01-20 NOTE — Progress Notes (Signed)
[place] Group Notes:  (Nursing/MHT/Case Management/Adjunct)  Date:  01/20/2015  Time:  10:50 PM   Type of Therapy:  Psychoeducational Skills  Participation Level:  Active  Participation Quality:  Attentive and Sharing  Affect:  Appropriate  Cognitive:  Appropriate  Insight:  Good  Engagement in Group:  Engaged  Modes of Intervention:  Discussion  Summary of Progress/Problems: Tonight in Wrap up group, Thi stated that her day was a 9 she said that the hospital is a very relaxing environment to help her and other people. Her support system is her boyfriend and his family. She stated that she isnt from the area and doesn't have much family help but that his family is more than enough. Jeanette Caprice 01/20/2015, 10:50 PM

## 2015-01-20 NOTE — Tx Team (Signed)
Interdisciplinary Treatment Plan Update (Adult)  Date:  01/20/2015  Time Reviewed:  12:56 PM   Progress in Treatment: Attending groups: Patient is attending groups. Participating in groups:  Patient engages in discussion Taking medication as prescribed:  Patient is taking medications Tolerating medication:  Patient is tolerating medications Family/Significant othe contact made:   No, but will ask for consent for collateral contact Patient understands diagnosis:Yes, patient understands diagnosis and need for treatment Discussing patient identified problems/goals with staff:  Yes, patient is able to express goals/problems Medical problems stabilized or resolved:  Yes Denies suicidal/homicidal ideation: Yes, patient is denying SI/HI. Issues/concerns per patient self-inventory:   Other:   Discharge Plan or Barriers:  Patient will return home and follow up with Dr. Toy Care  Reason for Continuation of Hospitalization: Anxiety Depression Medication stabilization Psychosis  Comments:   Additional comments:  Patient and CSW reviewed Patient Discharge Process Letter/Patient Involvement Form.  Patient verbalized understanding and signed form.  Patient and CSW also reviewed and identified patient's goals and treatment plan.  Patient verbalized understanding and agreed to plan.  Estimated length of stay: 3-5 days   Review of initial/current patient goals per problem list:  Please see care plan   Attendees: Patient 01/20/2015 12:56 PM   Family:   01/20/2015 12:56 PM   Physician:  Carlton Adam, MD 01/20/2015 12:56 PM   Nursing:   Mayra Neer 01/20/2015 12:56 PM   Clinical Social Worker:  Joette Catching, [place] 01/20/2015 12:56 PM   Clinical Social Worker:  Erasmo Downer Drinkard, LCSW-A 01/20/2015 12:56 PM   Case Manager:  Lars Pinks, RN 01/20/2015 12:56 PM   Other:  Marcella Dubs, RN 01/20/2015 12:56 PM  Other:   01/20/2015  12:56 PM   Other:  01/20/2015 12:56 PM   Other:  01/20/2015 12:56 PM    Other:  01/20/2015 12:56 PM   Other:   01/20/2015 12:56 PM   Other:   01/20/2015 12:56 PM   Other:  01/20/2015 12:56 PM   Other:   01/20/2015 12:56 PM    Scribe for Treatment Team:   Concha Pyo, 01/20/2015   12:56 PM

## 2015-01-20 NOTE — Progress Notes (Signed)
Recreation Therapy Notes  Date: 01/20/15 Time: 9:30am Location: [address] Group Room  Group Topic: Stress Management  Goal Area(s) Addresses:  Patient will actively participate in stress management techniques presented during session.   Intervention: Stress management techniques  Activity: Guided Imagery. LRT provided instruction and demonstration for Guided Imagery.   Education: Stress Management, Discharge Planning.   Clinical Observations/Feedback: Patient did not attend group.   Victorino Sparrow, LRT/CTRS         Victorino Sparrow A 01/20/2015 3:45 PM

## 2015-01-20 NOTE — [facility] Group Notes (Signed)
[facility] LCSW Aftercare Discharge Planning Group Note   01/20/2015 10:10 AM  Participation Quality:  Did not attend group.  Kendrea Cerritos, [place]

## 2015-01-20 NOTE — [facility] Suicide Risk Assessment (Signed)
[facility] Admission Suicide Risk Assessment   Nursing information obtained from:  Patient Demographic factors:  Caucasian Current Mental Status:  NA Loss Factors:  Financial problems / change in socioeconomic status Historical Factors:  Family history of suicide, Family history of mental illness or substance abuse Risk Reduction Factors:  Employed, Positive social support Total Time spent with patient: 45 minutes Principal Problem: <principal problem not specified> Diagnosis:   Patient Active Problem List   Diagnosis Date Noted  . MDD (major depressive disorder), recurrent, severe, with psychosis [F33.3] 01/19/2015  . AKI (acute kidney injury) [N17.9] 01/18/2015  . Hypokalemia [E87.6] 01/18/2015  . Migraine without aura [G43.009]   . IUD (intrauterine device) in place [Z97.5] 06/02/2013  . Hypothyroid [E03.9] 12/09/2012  . Finger fracture, left [S62.609A]   . Chronic left shoulder pain [M25.512, G89.29]   . Mood disorder [F39] 03/25/2012  . AR (allergic rhinitis) [J30.9] 03/25/2012  . Dysmenorrhea [N94.6] 03/25/2012  . PMDD (premenstrual dysphoric disorder) [N94.3] 03/25/2012  . Chronic insomnia [G47.00] 03/25/2012  . Migraine [346]      Continued Clinical Symptoms:  Alcohol Use Disorder Identification Test Final Score (AUDIT): 2 The "Alcohol Use Disorders Identification Test", Guidelines for Use in Primary Care, Second Edition.  World Pharmacologist [facility]). Score between 0-7:  no or low risk or alcohol related problems. Score between 8-15:  moderate risk of alcohol related problems. Score between 16-19:  high risk of alcohol related problems. Score 20 or above:  warrants further diagnostic evaluation for alcohol dependence and treatment.   CLINICAL FACTORS:   Severe Anxiety and/or Agitation Depression:   Severe   Musculoskeletal: Strength & Muscle Tone: within normal limits Gait & Station: normal Patient leans: normal  Psychiatric Specialty Exam: Physical Exam   Review of Systems  Constitutional: Negative.   HENT: Negative.   Eyes: Negative.   Respiratory:       Pack a day  Cardiovascular: Negative.   Gastrointestinal: Negative.   Genitourinary: Negative.   Musculoskeletal: Positive for back pain.  Skin: Negative.   Neurological: Positive for tremors.  Endo/Heme/Allergies: Negative.   Psychiatric/Behavioral: Positive for depression. The patient is nervous/anxious and has insomnia.     Blood pressure 106/66, pulse 90, temperature 98.4 F (36.9 C), temperature source Oral, resp. rate 20, height 5\' 7"  (1.702 m), weight 78.926 kg (174 lb), last menstrual period 01/15/2015.Body mass index is 27.25 kg/(m^2).  General Appearance: Fairly Groomed  Engineer, water::  Fair  Speech:  Clear and Coherent  Volume:  Normal  Mood:  Anxious  Affect:  anxious worried  Thought Process:  Coherent and Goal Directed  Orientation:  Full (Time, Place, and Person)  Thought Content:  events that happened symptoms worries concerns  Suicidal Thoughts:  No  Homicidal Thoughts:  No  Memory:  Immediate;   Fair Recent;   Fair Remote;   Fair  Judgement:  Fair  Insight:  Present  Psychomotor Activity:  Restlessness  Concentration:  Fair  Recall:  [employer] of Knowledge:Fair  Language: Fair  Akathisia:  No  Handed:  Right  AIMS (if indicated):     Assets:  Desire for Improvement Housing Talents/Skills Vocational/Educational  Sleep:     Cognition: WNL  ADL's:  Intact     COGNITIVE FEATURES THAT CONTRIBUTE TO RISK:  Closed-mindedness, Polarized thinking and Thought constriction (tunnel vision)    SUICIDE RISK:   Mild:  Suicidal ideation of limited frequency, intensity, duration, and specificity.  There are no identifiable plans, no associated intent, mild dysphoria  and related symptoms, good self-control (both objective and subjective assessment), few other risk factors, and identifiable protective factors, including available and accessible social  support. 34 Y/O female with history of anxiety, depression, ADHD who states she in maintenance treatment with Xanax 1 mg up to QID, Adderall 20 mg up to TID. She states she was places on Doxepin and shortly after wards she became psychotic having to be brought to the ED and admitted. She states that nothing else is of significance. She states she has been on the Xanax Adderall for years without any problems. The Doxepin was new to her. She states that the fact she is doing well now kind of proofs it PLAN OF CARE: Supportive approach/coping skills                               Psychosis ( resolved) will continue to monitor for recurrence                               Further evaluate for any other causes.                               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 Will encourage to try to lower dose due to the tolerance/increase dose/withdrawal cycle with Duanne Moron She is asking to be D/C as she needs to be back at work. Will reassess in the AM and if still doing well will D/C               Medical Decision Making:  Review of Psycho-Social Stressors (1), Review or order clinical lab tests (1), Review of Medication Regimen & Side Effects (2) and Review of New Medication or Change in Dosage (2)  I certify that inpatient services furnished can reasonably be expected to improve the patient's condition.   Karmyn Lowman A 01/20/2015, 1:50 PM

## 2015-01-20 NOTE — Progress Notes (Signed)
Patient ID: Barbara Rogers, female   DOB: 1981-01-04, 34 y.o.   MRN: [id number]   Pt currently presents with a flat affect and anxious behavior. Pt was unusually drowsy/sedated this morning.  Pt reports "I want to talk to a doctor, I don't think I'm going through withdrawal and I don't know why I'm here. I feel like I'm being punished for my reaction to [place]." Pt reports "I remember when I was hallucinating, I would talk to someone who wasn't there."  Pt provided with medications per providers orders. Pt's labs and vitals were monitored throughout the day. Pt supported emotionally and encouraged to express concerns and questions. Pt educated on medications and diet/nutrition. Pt's safety ensured with 15 minute and environmental checks. Pt currently denies SI/HI and A/V hallucinations. Pt verbally agrees to seek staff if SI/HI or A/VH occurs and to consult with staff before acting on these thoughts.

## 2015-01-20 NOTE — [facility] Group Notes (Signed)
[facility] LCSW Group Therapy  01/20/2015 4:17 PM  Type of Therapy:  Group Therapy  Participation Level:  Did Not Attend - patient was meeting with MD.  Joette Catching Hairston 01/20/2015, 4:17 PM

## 2015-01-20 NOTE — Progress Notes (Signed)
  [facility] Adult Case Management Discharge Plan :  Will you be returning to the same living situation after discharge:  Yes,  Patient is returning to her home. At discharge, do you have transportation home?:  Yes, patient will arrange transportation home. Do you have the ability to pay for your medications: Yes,  Patient is able to obtain medications.  Release of information consent forms completed and in the chart;  Patient's signature needed at discharge.  Patient to Follow up at: Follow-up Information    Follow up with [facility]. [facility] On 01/24/2015.   Why:  Dr. Toy Care Tuesday, Jan 24, 2015 at 2:15 [facility]   Contact information:   [place], [place]  [id number]  [phone]      Patient denies SI/HI: Patient no longer endorsing SI/HI or other thoughts of self harm.  Safety Planning and Suicide Prevention discussed: .Reviewed with all patients during discharge planning group  Have you used any form of tobacco in the last 30 days? (Cigarettes, Smokeless Tobacco, Cigars, and/or Pipes): Yes  Has patient been referred to the Quitline?:  Patient declined referral to Quitline.   Concha Pyo 01/20/2015, 4:07 PM

## 2015-01-20 NOTE — Plan of Care (Signed)
Problem: Alteration in mood; excessive anxiety as evidenced by: Goal: STG-Pt will report an absence of self-harm thoughts/actions (Patient will report an absence of self-harm thoughts or actions)  Outcome: Progressing Pt denies any thoughts of harming herself.

## 2015-01-20 NOTE — H&P (Signed)
Psychiatric Admission Assessment Adult  Patient Identification: Barbara Rogers MRN:  [id number] Date of Evaluation:  01/20/2015 Chief Complaint:  mdd Principal Diagnosis: Drug psychosis with hallucinations Diagnosis:   Patient Active Problem List   Diagnosis Date Noted  . 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 MDD (major depressive disorder), recurrent, severe, with psychosis [F33.3] 01/19/2015  . AKI (acute kidney injury) [N17.9] 01/18/2015  . Hypokalemia [E87.6] 01/18/2015  . Migraine without aura [G43.009]   . IUD (intrauterine device) in place [Z97.5] 06/02/2013  . Hypothyroid [E03.9] 12/09/2012  . Finger fracture, left [S62.609A]   . Chronic left shoulder pain [M25.512, G89.29]   . Mood disorder [F39] 03/25/2012  . AR (allergic rhinitis) [J30.9] 03/25/2012  . Dysmenorrhea [N94.6] 03/25/2012  . PMDD (premenstrual dysphoric disorder) [N94.3] 03/25/2012  . Chronic insomnia [G47.00] 03/25/2012  . Migraine [346]    History of Present Illness:: Patient states that she had been taking Doxepin and feels that she had a reaction to the medication; causing her to have hallucinations. "My boyfriend called for me to go to the hospital cause he said I was acting crazy.  I don't remember what I did; I know while I was taking Doxepin I was having a slew of hallucinations, but I really don't remember what I was doing. I won't in my right mind; but since I been off of that medicine I'm back to myself.  I had been taking it for bout a week.  The first day or so I was trying to hide it; I was feeling a little weird."  Patient denies suicidal/homicidal ideation, psychosis, and paranoia.  "I don't know where they got that suicide stuff from; I really don't remember that's why I had to call my boyfriend and ask; and he said he didn't tell no body nothing about a suicide; I guess they thought since my liver enzymes and stuff was high that I had taken an overdose; but I didn't.   Patient  has outpatient services with Dr. Toy Care and her next appointment is 01/24/2015. Patient also states that she has been stressed related to the death of her grandfather. "I think I may have had a nervous breakdown."     Elements:  Location:  Worsening depression. Quality:  hallucinations. Severity:  sever. Duration:  1 week. Associated Signs/Symptoms: Depression Symptoms:  depressed mood, insomnia, hopelessness, (Hypo) Manic Symptoms:  Irritable Mood, Anxiety Symptoms:  Excessive Worry, Psychotic Symptoms:  Hallucinations: Auditory Visual  Denies at this time; states it was related to her medication. PTSD Symptoms: Denies Total Time spent with patient: 1 hour  Past Medical History:  Past Medical History  Diagnosis Date  . Allergy   . Migraine without aura   . Anxiety   . Depression   . Dysmenorrhea   . PMDD (premenstrual dysphoric disorder)   . Insomnia   . Nephrolithiasis   . Finger fracture, left 10/31/2012    LEFT 4th finger  . Chronic left shoulder pain 08/2012  . Asthma     exacerbated by bronchitisi   History reviewed. No pertinent past surgical history. Family History:  Family History  Problem Relation Age of Onset  . Migraines Mother   . Mental illness Mother   . Heart disease Father   . Alcohol abuse Father   . Arthritis Maternal Grandmother   . Thyroid disease Maternal Grandmother   . Heart disease Maternal Grandfather     AMI 1996, 2014  . Anemia Maternal Grandfather     bone marrow  dysfunction   Social History:  History  Alcohol Use No     History  Drug Use  . Yes  . Special: Marijuana    History   Social History  . Marital Status: Single    Spouse Name: N/A  . Number of Children: 0  . Years of Education: 12   Occupational History  . [employer]  .      [employer]   Social History Main Topics  . Smoking status: Current Some Day Smoker -- 1.00 packs/day    Types: Cigarettes  . Smokeless tobacco: Never Used     Comment: using e-cig to try to  quit  . Alcohol Use: No  . Drug Use: Yes    Special: Marijuana  . Sexual Activity:    Partners: Male    Patent examiner Protection: IUD   Other Topics Concern  . None   Social History Narrative   Lives with her boyfriend.   Additional Social History:    Pain Medications: pt denies abuse - reports compliance w/ lidocaine patches Prescriptions: sse PTA list Over the Counter: pt denies abuse History of alcohol / drug use?: Yes Name of Substance 1: marijuana 1 - Age of First Use: 18 1 - Amount (size/oz): less than one gram 1 - Frequency: twice monthly  1 - Duration: off and on 1 - Last Use / Amount: unknown Name of Substance 2: alcohol 2 - Age of First Use: 18 2 - Amount (size/oz): 2-3 drinks 2 - Frequency: "every few months" 2 - Last Use / Amount: unknown   Musculoskeletal: Strength & Muscle Tone: within normal limits Gait & Station: normal Patient leans: N/A  Psychiatric Specialty Exam: Physical Exam  Nursing note and vitals reviewed. Constitutional: She is oriented to person, place, and time.  Neck: Normal range of motion.  Respiratory: Effort normal.  Musculoskeletal: Normal range of motion.  Neurological: She is alert and oriented to person, place, and time.  Psychiatric: Her speech is normal and behavior is normal. Thought content normal. Cognition and memory are normal. She exhibits a depressed mood.    Review of Systems  Musculoskeletal: Positive for joint pain.  Endo/Heme/Allergies:       Hyperthyroidism   Psychiatric/Behavioral: Positive for depression. Hallucinations: "While I was taking the diosapien I had a sllew of hallucinations. Substance abuse: Denies. The patient is nervous/anxious.   All other systems reviewed and are negative.   Blood pressure 106/66, pulse 90, temperature 98.4 F (36.9 C), temperature source Oral, resp. rate 20, height _0  (1.702 m), weight 78.926 kg (174 lb), last menstrual period 01/15/2015.Body mass index is 27.25  kg/(m^2).  General Appearance: Casual  Eye Contact::  Good  Speech:  Clear and Coherent  Volume:  Normal  Mood:  Anxious and Depressed  Affect:  Depressed  Thought Process:  Circumstantial  Orientation:  Full (Time, Place, and Person)  Thought Content:  Rumination and At this time denies hallucinations, delusions, and paranoia  Suicidal Thoughts:  No  Homicidal Thoughts:  No  Memory:  Immediate;   Fair Recent;   Fair Remote;   Good  Judgement:  Intact  Insight:  Present  Psychomotor Activity:  Normal  Concentration:  Fair  Recall:  [employer] of Knowledge:Good  Language: Good  Akathisia:  No  Handed:  Right  AIMS (if indicated):     Assets:  Communication Skills Desire for Improvement Social Support  ADL's:  Intact  Cognition: WNL  Sleep:  Risk to Self: Is patient at risk for suicide?: No What has been your use of drugs/alcohol within the last 12 months?: None Risk to Others:   Prior Inpatient Therapy:   Prior Outpatient Therapy:    Alcohol Screening: 1. How often do you have a drink containing alcohol?: Monthly or less 2. How many drinks containing alcohol do you have on a typical day when you are drinking?: 3 or 4 3. How often do you have six or more drinks on one occasion?: Never Preliminary Score: 1 4. How often during the last year have you found that you were not able to stop drinking once you had started?: Never 5. How often during the last year have you failed to do what was normally expected from you becasue of drinking?: Never 6. How often during the last year have you needed a first drink in the morning to get yourself going after a heavy drinking session?: Never 7. How often during the last year have you had a feeling of guilt of remorse after drinking?: Never 8. How often during the last year have you been unable to remember what happened the night before because you had been drinking?: Never 9. Have you or someone else been injured as a result of your  drinking?: No 10. Has a relative or friend or a doctor or another health worker been concerned about your drinking or suggested you cut down?: No Alcohol Use Disorder Identification Test Final Score (AUDIT): 2 Brief Intervention: AUDIT score less than 7 or less-screening does not suggest unhealthy drinking-brief intervention not indicated  Allergies:   Allergies  Allergen Reactions  . Penicillins     unknown  . Progestins     Extreme moodiness  . Sinequan [Doxepin] Other (See Comments) and Hypertension    Hallucinations, delusions, severe agitation   Lab Results:  Results for orders placed or performed during the hospital encounter of 01/16/15 (from the past 48 hour(s))  CBC     Status: Abnormal   Collection Time: 01/18/15  6:43 PM  Result Value Ref Range   WBC 8.7 4.0 - 10.5 K/uL   RBC 3.89 3.87 - 5.11 MIL/uL   Hemoglobin 11.5 (L) 12.0 - 15.0 g/dL   HCT 34.2 (L) 36.0 - 46.0 %   MCV 87.9 78.0 - 100.0 fL   MCH 29.6 26.0 - 34.0 pg   MCHC 33.6 30.0 - 36.0 g/dL   RDW 13.4 11.5 - 15.5 %   Platelets 215 150 - 400 K/uL  TSH     Status: None   Collection Time: 01/18/15  6:43 PM  Result Value Ref Range   TSH 0.434 0.350 - 4.500 uIU/mL  Basic metabolic panel     Status: Abnormal   Collection Time: 01/19/15  7:31 AM  Result Value Ref Range   Sodium 142 135 - 145 mmol/L   Potassium 3.5 3.5 - 5.1 mmol/L   Chloride 115 (H) 101 - 111 mmol/L   CO2 22 22 - 32 mmol/L   Glucose, Bld 98 65 - 99 mg/dL   BUN 19 6 - 20 mg/dL   Creatinine, Ser 2.19 (H) 0.44 - 1.00 mg/dL   Calcium 8.1 (L) 8.9 - 10.3 mg/dL   GFR calc non Af Amer 28 (L) >60 mL/min   GFR calc Af Amer 33 (L) >60 mL/min    Comment: (NOTE) The eGFR has been calculated using the CKD EPI equation. This calculation has not been validated in all clinical situations. eGFR's persistently <60 mL/min  signify possible Chronic Kidney Disease.    Anion gap 5 5 - 15  CBC     Status: Abnormal   Collection Time: 01/19/15  7:31 AM  Result  Value Ref Range   WBC 6.6 4.0 - 10.5 K/uL   RBC 3.53 (L) 3.87 - 5.11 MIL/uL   Hemoglobin 10.6 (L) 12.0 - 15.0 g/dL   HCT 31.2 (L) 36.0 - 46.0 %   MCV 88.4 78.0 - 100.0 fL   MCH 30.0 26.0 - 34.0 pg   MCHC 34.0 30.0 - 36.0 g/dL   RDW 13.4 11.5 - 15.5 %   Platelets 172 150 - 400 K/uL   Current Medications: Current Facility-Administered Medications  Medication Dose Route Frequency Provider Last Rate Last Dose  . cephALEXin (KEFLEX) capsule 500 mg  500 mg Oral 3 times per day Shuvon B Rankin, NP   500 mg at 01/20/15 1410  . hydrOXYzine (ATARAX/VISTARIL) tablet 25 mg  25 mg Oral Q6H PRN Shuvon B Rankin, NP      . levothyroxine (SYNTHROID, LEVOTHROID) tablet 50 mcg  50 mcg Oral QAC breakfast Shuvon B Rankin, NP   50 mcg at 01/20/15 0034  . loperamide (IMODIUM) capsule 2-4 mg  2-4 mg Oral PRN Shuvon B Rankin, NP      . LORazepam (ATIVAN) tablet 1 mg  1 mg Oral Q6H PRN Shuvon B Rankin, NP   1 mg at 01/20/15 0022  . LORazepam (ATIVAN) tablet 1 mg  1 mg Oral QID Shuvon B Rankin, NP   1 mg at 01/20/15 1138   Followed by  . [START ON 01/21/2015] LORazepam (ATIVAN) tablet 1 mg  1 mg Oral TID Shuvon B Rankin, NP       Followed by  . [START ON 01/22/2015] LORazepam (ATIVAN) tablet 1 mg  1 mg Oral BID Shuvon B Rankin, NP       Followed by  . [START ON 01/23/2015] LORazepam (ATIVAN) tablet 1 mg  1 mg Oral Daily Shuvon B Rankin, NP      . magnesium citrate solution 1 Bottle  1 Bottle Oral Once Kerrie Buffalo, NP      . magnesium hydroxide (MILK OF MAGNESIA) suspension 30 mL  30 mL Oral Daily PRN Shuvon B Rankin, NP      . multivitamin with minerals tablet 1 tablet  1 tablet Oral Daily Shuvon B Rankin, NP   1 tablet at 01/20/15 0759  . nicotine (NICODERM CQ - dosed in mg/24 hours) patch 21 mg  21 mg Transdermal Daily Harriet Butte, NP   21 mg at 01/20/15 0801  . ondansetron (ZOFRAN-ODT) disintegrating tablet 4 mg  4 mg Oral Q6H PRN Shuvon B Rankin, NP      . pregabalin (LYRICA) capsule 200 mg  200 mg Oral  TID Shuvon B Rankin, NP   200 mg at 01/20/15 1138  . QUEtiapine (SEROQUEL) tablet 400 mg  400 mg Oral QHS Shuvon B Rankin, NP   400 mg at 01/20/15 0022  . thiamine (B-1) injection 100 mg  100 mg Intramuscular Once Shuvon B Rankin, NP   100 mg at 01/20/15 0026  . [START ON 01/21/2015] thiamine (VITAMIN B-1) tablet 100 mg  100 mg Oral Daily Shuvon B Rankin, NP       PTA Medications: Prescriptions prior to admission  Medication Sig Dispense Refill Last Dose  . albuterol (PROVENTIL HFA;VENTOLIN HFA) 108 (90 BASE) MCG/ACT inhaler Inhale 2 puffs into the lungs every 4 (four) hours as needed for wheezing (  cough, shortness of breath or wheezing.). 1 Inhaler 1 unknown  . cephALEXin (KEFLEX) 500 MG capsule Take 1 capsule (500 mg total) by mouth every 8 (eight) hours. 9 capsule 0   . levothyroxine (SYNTHROID, LEVOTHROID) 50 MCG tablet TAKE 1 TABLET (50 MCG TOTAL) BY MOUTH DAILY. 90 tablet 1 unknown  . Multiple Vitamins-Minerals (MULTIVITAMINS THER. W/MINERALS) TABS Take 1 tablet by mouth daily.    unknown    Previous Psychotropic Medications: Yes   Substance Abuse History in the last 12 months:  No.    Consequences of Substance Abuse: Denies  Results for orders placed or performed during the hospital encounter of 01/16/15 (from the past 72 hour(s))  CK     Status: Abnormal   Collection Time: 01/17/15 10:53 PM  Result Value Ref Range   Total CK 1743 (H) 38 - 234 U/L  Comprehensive metabolic panel     Status: Abnormal   Collection Time: 01/17/15 11:00 PM  Result Value Ref Range   Sodium 139 135 - 145 mmol/L   Potassium 3.1 (L) 3.5 - 5.1 mmol/L   Chloride 106 101 - 111 mmol/L   CO2 22 22 - 32 mmol/L   Glucose, Bld 89 70 - 99 mg/dL   BUN 28 (H) 6 - 20 mg/dL   Creatinine, Ser 3.22 (H) 0.44 - 1.00 mg/dL    Comment: DELTA CHECK NOTED   Calcium 8.8 (L) 8.9 - 10.3 mg/dL   Total Protein 6.9 6.5 - 8.1 g/dL   Albumin 4.0 3.5 - 5.0 g/dL   AST 33 15 - 41 U/L   ALT 16 14 - 54 U/L   Alkaline  Phosphatase 79 38 - 126 U/L   Total Bilirubin 0.9 0.3 - 1.2 mg/dL   GFR calc non Af Amer 18 (L) >60 mL/min   GFR calc Af Amer 21 (L) >60 mL/min    Comment: (NOTE) The eGFR has been calculated using the CKD EPI equation. This calculation has not been validated in all clinical situations. eGFR's persistently <60 mL/min signify possible Chronic Kidney Disease.    Anion gap 11 5 - 15  CBC with Differential     Status: None   Collection Time: 01/17/15 11:00 PM  Result Value Ref Range   WBC 9.1 4.0 - 10.5 K/uL   RBC 4.15 3.87 - 5.11 MIL/uL   Hemoglobin 12.3 12.0 - 15.0 g/dL   HCT 36.0 36.0 - 46.0 %   MCV 86.7 78.0 - 100.0 fL   MCH 29.6 26.0 - 34.0 pg   MCHC 34.2 30.0 - 36.0 g/dL   RDW 13.1 11.5 - 15.5 %   Platelets 199 150 - 400 K/uL   Neutrophils Relative % 53 43 - 77 %   Neutro Abs 4.7 1.7 - 7.7 K/uL   Lymphocytes Relative 35 12 - 46 %   Lymphs Abs 3.2 0.7 - 4.0 K/uL   Monocytes Relative 11 3 - 12 %   Monocytes Absolute 1.0 0.1 - 1.0 K/uL   Eosinophils Relative 1 0 - 5 %   Eosinophils Absolute 0.1 0.0 - 0.7 K/uL   Basophils Relative 0 0 - 1 %   Basophils Absolute 0.0 0.0 - 0.1 K/uL  Urinalysis, Routine w reflex microscopic     Status: Abnormal   Collection Time: 01/18/15  2:15 AM  Result Value Ref Range   Color, Urine AMBER (A) YELLOW    Comment: BIOCHEMICALS MAY BE AFFECTED BY COLOR   APPearance CLEAR CLEAR   Specific Gravity, Urine 1.020  1.005 - 1.030   pH 5.5 5.0 - 8.0   Glucose, UA NEGATIVE NEGATIVE mg/dL   Hgb urine dipstick LARGE (A) NEGATIVE   Bilirubin Urine SMALL (A) NEGATIVE   Ketones, ur 15 (A) NEGATIVE mg/dL   Protein, ur 30 (A) NEGATIVE mg/dL   Urobilinogen, UA 0.2 0.0 - 1.0 mg/dL   Nitrite POSITIVE (A) NEGATIVE   Leukocytes, UA NEGATIVE NEGATIVE  Pregnancy, urine     Status: None   Collection Time: 01/18/15  2:15 AM  Result Value Ref Range   Preg Test, Ur NEGATIVE NEGATIVE    Comment:        THE SENSITIVITY OF THIS METHODOLOGY IS >20 mIU/mL.   Urine  microscopic-add on     Status: Abnormal   Collection Time: 01/18/15  2:15 AM  Result Value Ref Range   Squamous Epithelial / LPF MANY (A) RARE   WBC, UA 0-2 <3 WBC/hpf   RBC / HPF TOO NUMEROUS TO COUNT <3 RBC/hpf   Bacteria, UA MANY (A) RARE  Sodium, urine, random     Status: None   Collection Time: 01/18/15  2:15 AM  Result Value Ref Range   Sodium, Ur 48 mmol/L  Creatinine, urine, random     Status: None   Collection Time: 01/18/15  2:15 AM  Result Value Ref Range   Creatinine, Urine 158.35 mg/dL  Urine culture     Status: None   Collection Time: 01/18/15  3:15 AM  Result Value Ref Range   Specimen Description URINE, CLEAN CATCH    Special Requests NONE    Colony Count      8,000 COLONIES/ML Performed at [facility]    Culture      INSIGNIFICANT GROWTH Performed at [facility]    Report Status 01/19/2015 FINAL   Basic metabolic panel     Status: Abnormal   Collection Time: 01/18/15  7:19 AM  Result Value Ref Range   Sodium 141 135 - 145 mmol/L   Potassium 3.3 (L) 3.5 - 5.1 mmol/L   Chloride 111 101 - 111 mmol/L   CO2 24 22 - 32 mmol/L   Glucose, Bld 92 70 - 99 mg/dL   BUN 25 (H) 6 - 20 mg/dL   Creatinine, Ser 2.70 (H) 0.44 - 1.00 mg/dL   Calcium 8.3 (L) 8.9 - 10.3 mg/dL   GFR calc non Af Amer 22 (L) >60 mL/min   GFR calc Af Amer 26 (L) >60 mL/min    Comment: (NOTE) The eGFR has been calculated using the CKD EPI equation. This calculation has not been validated in all clinical situations. eGFR's persistently <60 mL/min signify possible Chronic Kidney Disease.    Anion gap 6 5 - 15  Basic metabolic panel     Status: Abnormal   Collection Time: 01/18/15  2:38 PM  Result Value Ref Range   Sodium 140 135 - 145 mmol/L   Potassium 3.7 3.5 - 5.1 mmol/L   Chloride 111 101 - 111 mmol/L   CO2 22 22 - 32 mmol/L   Glucose, Bld 94 70 - 99 mg/dL   BUN 22 (H) 6 - 20 mg/dL   Creatinine, Ser 2.62 (H) 0.44 - 1.00 mg/dL   Calcium 8.3 (L) 8.9 - 10.3 mg/dL    GFR calc non Af Amer 23 (L) >60 mL/min   GFR calc Af Amer 26 (L) >60 mL/min    Comment: (NOTE) The eGFR has been calculated using the CKD EPI equation. This calculation has not  been validated in all clinical situations. eGFR's persistently <60 mL/min signify possible Chronic Kidney Disease.    Anion gap 7 5 - 15  CBC     Status: Abnormal   Collection Time: 01/18/15  6:43 PM  Result Value Ref Range   WBC 8.7 4.0 - 10.5 K/uL   RBC 3.89 3.87 - 5.11 MIL/uL   Hemoglobin 11.5 (L) 12.0 - 15.0 g/dL   HCT 34.2 (L) 36.0 - 46.0 %   MCV 87.9 78.0 - 100.0 fL   MCH 29.6 26.0 - 34.0 pg   MCHC 33.6 30.0 - 36.0 g/dL   RDW 13.4 11.5 - 15.5 %   Platelets 215 150 - 400 K/uL  TSH     Status: None   Collection Time: 01/18/15  6:43 PM  Result Value Ref Range   TSH 0.434 0.350 - 4.500 uIU/mL  Basic metabolic panel     Status: Abnormal   Collection Time: 01/19/15  7:31 AM  Result Value Ref Range   Sodium 142 135 - 145 mmol/L   Potassium 3.5 3.5 - 5.1 mmol/L   Chloride 115 (H) 101 - 111 mmol/L   CO2 22 22 - 32 mmol/L   Glucose, Bld 98 65 - 99 mg/dL   BUN 19 6 - 20 mg/dL   Creatinine, Ser 2.19 (H) 0.44 - 1.00 mg/dL   Calcium 8.1 (L) 8.9 - 10.3 mg/dL   GFR calc non Af Amer 28 (L) >60 mL/min   GFR calc Af Amer 33 (L) >60 mL/min    Comment: (NOTE) The eGFR has been calculated using the CKD EPI equation. This calculation has not been validated in all clinical situations. eGFR's persistently <60 mL/min signify possible Chronic Kidney Disease.    Anion gap 5 5 - 15  CBC     Status: Abnormal   Collection Time: 01/19/15  7:31 AM  Result Value Ref Range   WBC 6.6 4.0 - 10.5 K/uL   RBC 3.53 (L) 3.87 - 5.11 MIL/uL   Hemoglobin 10.6 (L) 12.0 - 15.0 g/dL   HCT 31.2 (L) 36.0 - 46.0 %   MCV 88.4 78.0 - 100.0 fL   MCH 30.0 26.0 - 34.0 pg   MCHC 34.0 30.0 - 36.0 g/dL   RDW 13.4 11.5 - 15.5 %   Platelets 172 150 - 400 K/uL    Observation Level/Precautions:  15 minute checks  Laboratory:   CBC Chemistry Profile UDS UA  Psychotherapy:  Individual and group sessions  Medications:  Will start medications add/adjust as appropriate for patient stabilization  Consultations:  Psychiatry  Discharge Concerns:  Safety, stabilization, and risk of access to medication and medication stabilization   Estimated LOS:  5-7 days  Other:     Psychological Evaluations: Yes   Treatment Plan Summary: Daily contact with patient to assess and evaluate symptoms and progress in treatment and Medication management  1. Admit for crisis management and stabilization 2. Medication management to reduce current symptoms to bale line and improve the patient's overall level of functioning:  Started on the Ativan detox protocol. Continues Seroquel 400 mg 3. Treat health problems as indicated 4. Develop treatment plan to decrease risk of relapse upon discharge and the need for readmission. 5. Psycho-social education regarding relapse prevention and self care. 6. Health care follow up as needed for medical problems 7. Restart home medications where appropriate.    Medical Decision Making:  Established Problem, Stable/Improving (1), Review of Psycho-Social Stressors (1), Review or order clinical lab tests (1),  Review and summation of old records (2), Review of Last Therapy Session (1), Independent Review of image, tracing or specimen (2) and Review of Medication Regimen & Side Effects (2)  I certify that inpatient services furnished can reasonably be expected to improve the patient's condition.   Earleen Newport, FNP-BC 5/13/20162:54 PM I personally assessed the patient, reviewed the physical exam and labs and formulated the treatment plan Geralyn Flash A. Sabra Heck, M.D.
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
## 2015-01-20 NOTE — Progress Notes (Signed)
Pt reports she has had a good day, and although she did not want to come to [facility], she feels it is a good experience for her.  She says that she feels better now that the doxepin is out of her system.  She does say that she still feels shaky and that she really does not want to stop taking the xanax.  She denies SI/HI/AV.  She voices her gratitude for all that the staff has done for her since she came in last night.  Pt makes her needs known to staff.  Pt is hopeful to discharge homein the next day or two so that she does not miss any more work.  Support and encouragement offered. Safety maintained with q15 minute checks.

## 2015-01-20 NOTE — [facility] Suicide Risk Assessment (Signed)
[facility] INPATIENT:  Family/Significant Other Suicide Prevention Education  Suicide Prevention Education:  Education Complete; Cleatrice Burke, Boyfriend, [phone]; has been identified by the patient as the family member/significant other with whom the patient will be residing, and identified as the person(s) who will aid the patient in the event of a mental health crisis (suicidal ideations/suicide attempt).  With written consent from the patient, the family member/significant other has been provided the following suicide prevention education, prior to the and/or following the discharge of the patient.  The suicide prevention education provided includes the following:  Suicide risk factors  Suicide prevention and interventions  National Suicide Hotline telephone number  [facility] assessment telephone number  [facility] Emergency Assistance [place] and/or Residential Mobile Crisis Unit telephone number  Request made of family/significant other to:  Remove weapons (e.g., guns, rifles, knives), all items previously/currently identified as safety concern.    Remove drugs/medications (over-the-counter, prescriptions, illicit drugs), all items previously/currently identified as a safety concern.  The family member/significant other verbalizes understanding of the suicide prevention education information provided.  The family member/significant other agrees to remove the items of safety concern listed above.  Concha Pyo 01/20/2015, 4:12 PM

## 2015-01-21 ENCOUNTER — Encounter (HOSPITAL_COMMUNITY): Payer: Self-pay | Admitting: Registered Nurse

## 2015-01-21 DIAGNOSIS — F19951 Other psychoactive substance use, unspecified with psychoactive substance-induced psychotic disorder with hallucinations: Secondary | ICD-10-CM

## 2015-01-21 MED ORDER — CEPHALEXIN 500 MG PO CAPS: 500.0000 mg | ORAL_CAPSULE | Freq: Two times a day (BID) | ORAL | Status: DC

## 2015-01-21 MED ORDER — PREGABALIN 200 MG PO CAPS: 200.0000 mg | ORAL_CAPSULE | Freq: Three times a day (TID) | ORAL | Status: DC

## 2015-01-21 MED ORDER — HYDROXYZINE HCL 25 MG PO TABS: 25.0000 mg | ORAL_TABLET | Freq: Four times a day (QID) | ORAL | Status: DC | PRN

## 2015-01-21 MED ORDER — QUETIAPINE FUMARATE 400 MG PO TABS: 400.0000 mg | ORAL_TABLET | Freq: Every day | ORAL | Status: DC

## 2015-01-21 NOTE — Discharge Summary (Signed)
Physician Discharge Summary Note  Patient:  Barbara Rogers is an 34 y.o., female MRN:  [id number] DOB:  [birth year] Patient phone:  [phone] (home)  Patient address:   [address] [place] [address],  Total Time spent with patient: Greater than 30 minutes  Date of Admission:  01/19/2015 Date of Discharge: 01/21/2015  Reason for Admission:  Per H&P admission:  Patient states that she had been taking Doxepin and feels that she had a reaction to the medication; causing her to have hallucinations. "My boyfriend called for me to go to the hospital cause he said I was acting crazy. I don't remember what I did; I know while I was taking Doxepin I was having a slew of hallucinations, but I really don't remember what I was doing. I won't in my right mind; but since I been off of that medicine I'm back to myself. I had been taking it for bout a week. The first day or so I was trying to hide it; I was feeling a little weird." Patient denies suicidal/homicidal ideation, psychosis, and paranoia. "I don't know where they got that suicide stuff from; I really don't remember that's why I had to call my boyfriend and ask; and he said he didn't tell no body nothing about a suicide; I guess they thought since my liver enzymes and stuff was high that I had taken an overdose; but I didn't.  Patient has outpatient services with Dr. Toy Care and her next appointment is 01/24/2015. Patient also states that she has been stressed related to the death of her grandfather. "I think I may have had a nervous breakdown."    Principal Problem: Drug psychosis with hallucinations Discharge Diagnoses: Patient Active Problem List   Diagnosis Date Noted  . 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 MDD (major depressive disorder), recurrent, severe, with psychosis [F33.3] 01/19/2015  . AKI (acute kidney injury) [N17.9] 01/18/2015  . Hypokalemia [E87.6] 01/18/2015  . Migraine without aura [G43.009]    . IUD (intrauterine device) in place [Z97.5] 06/02/2013  . Hypothyroid [E03.9] 12/09/2012  . Finger fracture, left [S62.609A]   . Chronic left shoulder pain [M25.512, G89.29]   . Mood disorder [F39] 03/25/2012  . AR (allergic rhinitis) [J30.9] 03/25/2012  . Dysmenorrhea [N94.6] 03/25/2012  . PMDD (premenstrual dysphoric disorder) [N94.3] 03/25/2012  . Chronic insomnia [G47.00] 03/25/2012  . Migraine [346]     Musculoskeletal: Strength & Muscle Tone: within normal limits Gait & Station: normal Patient leans: N/A  Psychiatric Specialty Exam:  See Suicide Risk Assessment Physical Exam  Nursing note and vitals reviewed. Constitutional: She is oriented to person, place, and time.  Neck: Normal range of motion.  Respiratory: Effort normal.  Musculoskeletal: Normal range of motion.  Neurological: She is alert and oriented to person, place, and time.    Review of Systems  Psychiatric/Behavioral: Negative for suicidal ideas, hallucinations and substance abuse. Depression: Stable. Nervous/anxious: Stable. Insomnia: Stable.   All other systems reviewed and are negative.   Blood pressure 123/86, pulse 98, temperature 98.6 F (37 C), temperature source Oral, resp. rate 16, height '5\' 7"'  (1.702 m), weight 78.926 kg (174 lb), last menstrual period 01/15/2015.Body mass index is 27.25 kg/(m^2).  Have you used any form of tobacco in the last 30 days? (Cigarettes, Smokeless Tobacco, Cigars, and/or Pipes): Yes  Has this patient used any form of tobacco in the last 30 days? (Cigarettes, Smokeless Tobacco, Cigars, and/or Pipes) Yes, A prescription for an FDA-approved tobacco cessation medication was  offered at discharge and the patient refused  Past Medical History:  Past Medical History  Diagnosis Date  . Allergy   . Migraine without aura   . Anxiety   . Depression   . Dysmenorrhea   . PMDD (premenstrual dysphoric disorder)   . Insomnia   . Nephrolithiasis   . Finger fracture, left  10/31/2012    LEFT 4th finger  . Chronic left shoulder pain 08/2012  . Asthma     exacerbated by bronchitisi   History reviewed. No pertinent past surgical history. Family History:  Family History  Problem Relation Age of Onset  . Migraines Mother   . Mental illness Mother   . Heart disease Father   . Alcohol abuse Father   . Arthritis Maternal Grandmother   . Thyroid disease Maternal Grandmother   . Heart disease Maternal Grandfather     AMI 1996, 2014  . Anemia Maternal Grandfather     bone marrow dysfunction   Social History:  History  Alcohol Use No     History  Drug Use  . Yes  . Special: Marijuana    History   Social History  . Marital Status: Single    Spouse Name: N/A  . Number of Children: 0  . Years of Education: 12   Occupational History  . [employer]  .      [employer]   Social History Main Topics  . Smoking status: Current Some Day Smoker -- 1.00 packs/day    Types: Cigarettes  . Smokeless tobacco: Never Used     Comment: using e-cig to try to quit  . Alcohol Use: No  . Drug Use: Yes    Special: Marijuana  . Sexual Activity:    Partners: Male    Patent examiner Protection: IUD   Other Topics Concern  . None   Social History Narrative   Lives with her boyfriend.    Risk to Self: Is patient at risk for suicide?: No What has been your use of drugs/alcohol within the last 12 months?: None Risk to Others:   Prior Inpatient Therapy:   Prior Outpatient Therapy:    Level of Care:  OP  Hospital Course:  Barbara Rogers was admitted for Drug psychosis with hallucinations and crisis management.  She was treated discharged with the medications listed below under Medication List.  Medical problems were identified and treated as needed.  Home medications were restarted as appropriate.  Improvement was monitored by observation and Barbara Rogers daily report of symptom reduction.  Emotional and mental status was monitored by daily self-inventory reports  completed by Barbara Rogers and clinical staff.         Barbara Rogers was evaluated by the treatment team for stability and plans for continued recovery upon discharge.  Barbara Rogers motivation was an integral factor for scheduling further treatment.  Employment, transportation, bed availability, health status, family support, and any pending legal issues were also considered during her hospital stay.  She was offered further treatment options upon discharge including but not limited to Residential, Intensive Outpatient, and Outpatient treatment.  Barbara Rogers will follow up with the services as listed below under Follow Up Information.     Upon completion of this admission the patient was both mentally and medically stable for discharge denying suicidal/homicidal ideation, auditory/visual/tactile hallucinations, delusional thoughts and paranoia.      Consults:  psychiatry  Significant Diagnostic Studies:  labs: CBC/Diff, CMET, UDS, ETOH, Urinalysis  Discharge Vitals:   Blood pressure 123/86, pulse 98, temperature 98.6 F (37 C), temperature source Oral, resp. rate 16, height '5\' 7"'  (1.702 m), weight 78.926 kg (174 lb), last menstrual period 01/15/2015. Body mass index is 27.25 kg/(m^2). Lab Results:   Results for orders placed or performed during the hospital encounter of 01/16/15 (from the past 72 hour(s))  Basic metabolic panel     Status: Abnormal   Collection Time: 01/18/15  2:38 PM  Result Value Ref Range   Sodium 140 135 - 145 mmol/L   Potassium 3.7 3.5 - 5.1 mmol/L   Chloride 111 101 - 111 mmol/L   CO2 22 22 - 32 mmol/L   Glucose, Bld 94 70 - 99 mg/dL   BUN 22 (H) 6 - 20 mg/dL   Creatinine, Ser 2.62 (H) 0.44 - 1.00 mg/dL   Calcium 8.3 (L) 8.9 - 10.3 mg/dL   GFR calc non Af Amer 23 (L) >60 mL/min   GFR calc Af Amer 26 (L) >60 mL/min    Comment: (NOTE) The eGFR has been calculated using the CKD EPI equation. This calculation has not been validated in all clinical  situations. eGFR's persistently <60 mL/min signify possible Chronic Kidney Disease.    Anion gap 7 5 - 15  CBC     Status: Abnormal   Collection Time: 01/18/15  6:43 PM  Result Value Ref Range   WBC 8.7 4.0 - 10.5 K/uL   RBC 3.89 3.87 - 5.11 MIL/uL   Hemoglobin 11.5 (L) 12.0 - 15.0 g/dL   HCT 34.2 (L) 36.0 - 46.0 %   MCV 87.9 78.0 - 100.0 fL   MCH 29.6 26.0 - 34.0 pg   MCHC 33.6 30.0 - 36.0 g/dL   RDW 13.4 11.5 - 15.5 %   Platelets 215 150 - 400 K/uL  TSH     Status: None   Collection Time: 01/18/15  6:43 PM  Result Value Ref Range   TSH 0.434 0.350 - 4.500 uIU/mL  Basic metabolic panel     Status: Abnormal   Collection Time: 01/19/15  7:31 AM  Result Value Ref Range   Sodium 142 135 - 145 mmol/L   Potassium 3.5 3.5 - 5.1 mmol/L   Chloride 115 (H) 101 - 111 mmol/L   CO2 22 22 - 32 mmol/L   Glucose, Bld 98 65 - 99 mg/dL   BUN 19 6 - 20 mg/dL   Creatinine, Ser 2.19 (H) 0.44 - 1.00 mg/dL   Calcium 8.1 (L) 8.9 - 10.3 mg/dL   GFR calc non Af Amer 28 (L) >60 mL/min   GFR calc Af Amer 33 (L) >60 mL/min    Comment: (NOTE) The eGFR has been calculated using the CKD EPI equation. This calculation has not been validated in all clinical situations. eGFR's persistently <60 mL/min signify possible Chronic Kidney Disease.    Anion gap 5 5 - 15  CBC     Status: Abnormal   Collection Time: 01/19/15  7:31 AM  Result Value Ref Range   WBC 6.6 4.0 - 10.5 K/uL   RBC 3.53 (L) 3.87 - 5.11 MIL/uL   Hemoglobin 10.6 (L) 12.0 - 15.0 g/dL   HCT 31.2 (L) 36.0 - 46.0 %   MCV 88.4 78.0 - 100.0 fL   MCH 30.0 26.0 - 34.0 pg   MCHC 34.0 30.0 - 36.0 g/dL   RDW 13.4 11.5 - 15.5 %   Platelets 172 150 - 400 K/uL    Physical Findings: AIMS:  Facial and Oral Movements Muscles of Facial Expression: None, normal Lips and Perioral Area: None, normal Jaw: None, normal Tongue: None, normal,Extremity Movements Upper (arms, wrists, hands, fingers): None, normal Lower (legs, knees, ankles, toes): None,  normal, Trunk Movements Neck, shoulders, hips: None, normal, Overall Severity Severity of abnormal movements (highest score from questions above): None, normal Incapacitation due to abnormal movements: None, normal Patient's awareness of abnormal movements (rate only patient's report): No Awareness, Dental Status Current problems with teeth and/or dentures?: No (missing teeth) Does patient usually wear dentures?: No  CIWA:  CIWA-Ar Total: 0 COWS:      See Psychiatric Specialty Exam and Suicide Risk Assessment completed by Attending Physician prior to discharge.  Discharge destination:  Home  Is patient on multiple antipsychotic therapies at discharge:  No   Has Patient had three or more failed trials of antipsychotic monotherapy by history:  No    Recommended Plan for Multiple Antipsychotic Therapies: NA      Discharge Instructions    Activity as tolerated - No restrictions    Complete by:  As directed      Diet general    Complete by:  As directed      Discharge instructions    Complete by:  As directed   Take all of you medications as prescribed by your mental healthcare provider.  Report any adverse effects and reactions from your medications to your outpatient provider promptly. Do not engage in alcohol and or illegal drug use while on prescription medicines. In the event of worsening symptoms call the crisis hotline, 911, and or go to the nearest emergency department for appropriate evaluation and treatment of symptoms. Follow-up with your primary care provider for your medical issues, concerns and or health care needs.   Keep all scheduled appointments.  If you are unable to keep an appointment call to reschedule.  Let the nurse know if you will need medications before next scheduled appointment.            Medication List    TAKE these medications      Indication   albuterol 108 (90 BASE) MCG/ACT inhaler  Commonly known as:  PROVENTIL HFA;VENTOLIN HFA  Inhale 2  puffs into the lungs every 4 (four) hours as needed for wheezing (cough, shortness of breath or wheezing.).      cephALEXin 500 MG capsule  Commonly known as:  KEFLEX  Take 1 capsule (500 mg total) by mouth 2 (two) times daily.   Indication:  bacterial infection     hydrOXYzine 25 MG tablet  Commonly known as:  ATARAX/VISTARIL  Take 1 tablet (25 mg total) by mouth every 6 (six) hours as needed (anxiety/agitation).   Indication:  anxiety     levothyroxine 50 MCG tablet  Commonly known as:  SYNTHROID, LEVOTHROID  TAKE 1 TABLET (50 MCG TOTAL) BY MOUTH DAILY.      multivitamins ther. w/minerals Tabs tablet  Take 1 tablet by mouth daily.      pregabalin 200 MG capsule  Commonly known as:  LYRICA  Take 1 capsule (200 mg total) by mouth 3 (three) times daily.   Indication:  Pain     QUEtiapine 400 MG tablet  Commonly known as:  SEROQUEL  Take 1 tablet (400 mg total) by mouth at bedtime.   Indication:  Mood stabilization       Follow-up Information    Follow up with [facility] On 01/24/2015.   Why:  Dr. Toy Care  Tuesday, Jan 24, 2015 at 2:15 [facility]   Contact information:   [address] [place], [place]  [address]  [phone]      Follow-up recommendations:  Activity:  As tolerated Diet:  As tolerated  Comments:   Patient has been instructed to take medications as prescribed; and report adverse effects to outpatient provider.  Follow up with primary doctor for any medical issues and If symptoms recur report to nearest emergency or crisis hot line.    Total Discharge Time: Greater than 30 minutes  Signed: Earleen Newport, FNP-BC 01/21/2015, 11:01 AM   I have examined the patient and agree with the discharge plan and findings. I have also done suicide assessment on this patient.

## 2015-01-21 NOTE — Progress Notes (Signed)
Discharge note:  Patient discharged home per MD order.  Patient received all personal belongings, prescriptions and medication samples.  Patient's discharge instructions reviewed, along with follow up and medication schedule.  Patient indicated understanding.  She denies SI/HI/AVH.  She left ambulatory with her boyfriend.

## 2015-01-21 NOTE — [facility] Suicide Risk Assessment (Signed)
[facility] Discharge Suicide Risk Assessment   Demographic Factors:  female with recent loss of family member  Total Time spent with patient: 30 minutes  Musculoskeletal: Strength & Muscle Tone: within normal limits Gait & Station: normal Patient leans: no lean  Psychiatric Specialty Exam: Physical Exam  Constitutional: She is oriented to person, place, and time. No distress.  HENT:  Head: Normocephalic and atraumatic.  Neurological: She is alert and oriented to person, place, and time.  Skin: She is not diaphoretic.  Psychiatric: She has a normal mood and affect.    Review of Systems  Constitutional: Negative.   Cardiovascular: Negative for chest pain.  Skin: Negative for rash.  Psychiatric/Behavioral: Negative for depression and suicidal ideas.    Blood pressure 123/86, pulse 98, temperature 98.6 F (37 C), temperature source Oral, resp. rate 16, height 5\' 7"  (1.702 m), weight 78.926 kg (174 lb), last menstrual period 01/15/2015.Body mass index is 27.25 kg/(m^2).  General Appearance: Casual  Eye Contact::  Fair  Speech:  Slow409  Volume:  Normal  Mood:  Euthymic  Affect:  Congruent  Thought Process:  Coherent  Orientation:  Full (Time, Place, and Person)  Thought Content:  Rumination  Suicidal Thoughts:  No  Homicidal Thoughts:  No  Memory:  Immediate;   Fair Recent;   Fair  Judgement:  Fair  Insight:  Fair  Psychomotor Activity:  Normal  Concentration:  Fair  Recall:  [employer] of Knowledge:Fair  Language: Fair  Akathisia:  Negative  Handed:  Right  AIMS (if indicated):     Assets:  Desire for Improvement Vocational/Educational  Sleep:     Cognition: WNL  ADL's:  Intact   Have you used any form of tobacco in the last 30 days? (Cigarettes, Smokeless Tobacco, Cigars, and/or Pipes): Yes  Has this patient used any form of tobacco in the last 30 days? (Cigarettes, Smokeless Tobacco, Cigars, and/or Pipes) N/A  Mental Status Per Nursing Assessment::   On  Admission:  NA  Current Mental Status by Physician: see MSE above  Loss Factors: Loss of significant relationship  Historical Factors: Impulsivity  Risk Reduction Factors:   Positive coping skills or problem solving skills  Continued Clinical Symptoms:  Dysthymia  Cognitive Features That Contribute To Risk:  None    Suicide Risk:  Minimal: No identifiable suicidal ideation.  Patients presenting with no risk factors but with morbid ruminations; may be classified as minimal risk based on the severity of the depressive symptoms  Principal Problem: Drug psychosis with hallucinations Discharge Diagnoses:  Patient Active Problem List   Diagnosis Date Noted  . 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 MDD (major depressive disorder), recurrent, severe, with psychosis [F33.3] 01/19/2015  . AKI (acute kidney injury) [N17.9] 01/18/2015  . Hypokalemia [E87.6] 01/18/2015  . Migraine without aura [G43.009]   . IUD (intrauterine device) in place [Z97.5] 06/02/2013  . Hypothyroid [E03.9] 12/09/2012  . Finger fracture, left [S62.609A]   . Chronic left shoulder pain [M25.512, G89.29]   . Mood disorder [F39] 03/25/2012  . AR (allergic rhinitis) [J30.9] 03/25/2012  . Dysmenorrhea [N94.6] 03/25/2012  . PMDD (premenstrual dysphoric disorder) [N94.3] 03/25/2012  . Chronic insomnia [G47.00] 03/25/2012  . Migraine [346]     Follow-up Information    Follow up with [facility]. [facility] On 01/24/2015.   Why:  Dr. Toy Care Tuesday, Jan 24, 2015 at 2:15 [facility]   Contact information:   [address] [place], [place]  [address]  [phone]  Plan Of Care/Follow-up recommendations:  Activity:  as tolerated Diet:  regular See Discharge summary for details and follow ups above. Compliance stressed. Is patient on multiple antipsychotic therapies at discharge:  No   Has Patient had three or more failed trials of antipsychotic monotherapy by history:  No  Recommended  Plan for Multiple Antipsychotic Therapies: NA    Barbara Rogers 01/21/2015, 12:26 PM

## 2015-01-23 ENCOUNTER — Encounter: Payer: Self-pay | Admitting: Physician Assistant

## 2015-01-23 MED ORDER — FLUCONAZOLE 150 MG PO TABS: 150.0000 mg | ORAL_TABLET | Freq: Once | ORAL | Status: DC

## 2015-02-02 ENCOUNTER — Encounter: Payer: Self-pay | Admitting: Physician Assistant

## 2015-02-09 ENCOUNTER — Telehealth: Payer: Self-pay | Admitting: Radiology

## 2015-02-09 MED ORDER — HYDROCODONE-ACETAMINOPHEN 10-325 MG PO TABS: 1.0000 | ORAL_TABLET | Freq: Three times a day (TID) | ORAL | Status: DC | PRN

## 2015-02-09 NOTE — Telephone Encounter (Signed)
Notified pt that rx is ready.

## 2015-02-09 NOTE — Telephone Encounter (Signed)
Patient notified via My Chart.  Meds ordered this encounter  Medications  . HYDROcodone-acetaminophen (NORCO) 10-325 MG per tablet    Sig: Take 1 tablet by mouth every 8 (eight) hours as needed for severe pain.    Dispense:  90 tablet    Refill:  0    Order Specific Question:  Supervising Provider    Answer:  DOOLITTLE, ROBERT P [[id number]]

## 2015-03-03 ENCOUNTER — Other Ambulatory Visit: Payer: Self-pay | Admitting: Physician Assistant

## 2015-03-03 ENCOUNTER — Encounter: Payer: Self-pay | Admitting: Physician Assistant

## 2015-03-09 ENCOUNTER — Other Ambulatory Visit: Payer: Self-pay | Admitting: Physician Assistant

## 2015-03-09 MED ORDER — HYDROCODONE-ACETAMINOPHEN 10-325 MG PO TABS: 1.0000 | ORAL_TABLET | Freq: Three times a day (TID) | ORAL | Status: DC | PRN

## 2015-03-09 NOTE — Telephone Encounter (Signed)
Rx printed.  Meds ordered this encounter  Medications  . HYDROcodone-acetaminophen (NORCO) 10-325 MG per tablet    Sig: Take 1 tablet by mouth every 8 (eight) hours as needed for severe pain.    Dispense:  90 tablet    Refill:  0    Order Specific Question:  Supervising Provider    Answer:  DOOLITTLE, ROBERT P [[id number]]

## 2015-03-09 NOTE — Telephone Encounter (Signed)
Rx in drawer, and pt notified by MyChart.

## 2015-03-16 ENCOUNTER — Encounter: Payer: Self-pay | Admitting: Physician Assistant

## 2015-03-21 ENCOUNTER — Encounter: Payer: Self-pay | Admitting: Physician Assistant

## 2015-03-23 ENCOUNTER — Encounter: Payer: Self-pay | Admitting: Physician Assistant

## 2015-03-31 ENCOUNTER — Ambulatory Visit (INDEPENDENT_AMBULATORY_CARE_PROVIDER_SITE_OTHER): Payer: BLUE CROSS/BLUE SHIELD | Admitting: Physician Assistant

## 2015-03-31 ENCOUNTER — Encounter: Payer: Self-pay | Admitting: Physician Assistant

## 2015-03-31 ENCOUNTER — Telehealth: Payer: Self-pay

## 2015-03-31 VITALS — BP 120/76 | HR 80 | Temp 98.4°F | Resp 16 | Ht 68.0 in | Wt 167.8 lb

## 2015-03-31 DIAGNOSIS — G4700 Insomnia, unspecified: Secondary | ICD-10-CM

## 2015-03-31 DIAGNOSIS — N920 Excessive and frequent menstruation with regular cycle: Secondary | ICD-10-CM

## 2015-03-31 DIAGNOSIS — M25512 Pain in left shoulder: Secondary | ICD-10-CM | POA: Diagnosis not present

## 2015-03-31 DIAGNOSIS — G43011 Migraine without aura, intractable, with status migrainosus: Secondary | ICD-10-CM | POA: Diagnosis not present

## 2015-03-31 DIAGNOSIS — G8929 Other chronic pain: Secondary | ICD-10-CM | POA: Diagnosis not present

## 2015-03-31 DIAGNOSIS — R42 Dizziness and giddiness: Secondary | ICD-10-CM

## 2015-03-31 DIAGNOSIS — E039 Hypothyroidism, unspecified: Secondary | ICD-10-CM

## 2015-03-31 DIAGNOSIS — F333 Major depressive disorder, recurrent, severe with psychotic symptoms: Secondary | ICD-10-CM

## 2015-03-31 DIAGNOSIS — F39 Unspecified mood [affective] disorder: Secondary | ICD-10-CM

## 2015-03-31 DIAGNOSIS — F5104 Psychophysiologic insomnia: Secondary | ICD-10-CM

## 2015-03-31 DIAGNOSIS — F3281 Premenstrual dysphoric disorder: Secondary | ICD-10-CM

## 2015-03-31 DIAGNOSIS — N946 Dysmenorrhea, unspecified: Secondary | ICD-10-CM

## 2015-03-31 DIAGNOSIS — N943 Premenstrual tension syndrome: Secondary | ICD-10-CM

## 2015-03-31 LAB — POCT CBC
Granulocyte percent: 60.8 %G (ref 37–80)
HCT, POC: 39.2 % (ref 37.7–47.9)
Hemoglobin: 13.1 g/dL (ref 12.2–16.2)
LYMPH, POC: 2 (ref 0.6–3.4)
MCH: 29.7 pg (ref 27–31.2)
MCHC: 33.5 g/dL (ref 31.8–35.4)
MCV: 88.5 fL (ref 80–97)
MID (CBC): 0.4 (ref 0–0.9)
MPV: 9 fL (ref 0–99.8)
POC Granulocyte: 3.6 (ref 2–6.9)
POC LYMPH PERCENT: 33.2 %L (ref 10–50)
POC MID %: 6 % (ref 0–12)
Platelet Count, POC: 252 10*3/uL (ref 142–424)
RBC: 4.42 M/uL (ref 4.04–5.48)
RDW, POC: 15.1 %
WBC: 6 10*3/uL (ref 4.6–10.2)

## 2015-03-31 LAB — GLUCOSE, POCT (MANUAL RESULT ENTRY): POC Glucose: 96 mg/dl (ref 70–99)

## 2015-03-31 MED ORDER — OXYCODONE-ACETAMINOPHEN 7.5-325 MG PO TABS: 1.0000 | ORAL_TABLET | Freq: Three times a day (TID) | ORAL | Status: DC | PRN

## 2015-03-31 MED ORDER — DIAZEPAM 10 MG PO TABS: 10.0000 mg | ORAL_TABLET | Freq: Four times a day (QID) | ORAL | Status: DC | PRN

## 2015-03-31 MED ORDER — HYDROCODONE-ACETAMINOPHEN 7.5-325 MG PO TABS: 1.0000 | ORAL_TABLET | Freq: Three times a day (TID) | ORAL | Status: DC | PRN

## 2015-03-31 MED ORDER — OXYCODONE-ACETAMINOPHEN 7.5-325 MG PO TABS: 1.0000 | ORAL_TABLET | ORAL | Status: DC | PRN

## 2015-03-31 NOTE — Progress Notes (Signed)
Patient ID: Barbara Rogers, female    DOB: [birth year], 34 y.o.   MRN: [id number]  PCP: Wynne Dust  Subjective:   Chief Complaint  Patient presents with  . Depression    pt. wants to talk to Dr. Domingo Mend   . Abdominal Pain    x comes on and goes for years     HPI Presents requesting a work note (due to dysmenorrhea) and prescriptions (requests Valium and pain medication).  Needs a note for Fri, Mon, Tuesday, RTW on Wednesday. She's been unable to work due to dysmenorrhea. This has been an ongoing problem for years. She's tried multiple methods of contraception to minimize her symptoms (severe cramps that extend into the low back and heavy bleeding), all without benefit or with severe adverse reactions. She currently uses Paragard for contraception, but desires a hysterectomy. Contacted Dr. Glo Herring regarding a hysterectomy ("he's a family doctor in [place] who has done hysterectomies for almost every woman in my family"), but it's to be several months before she can get in with him.  This period began yesterday. Took 6 tylenol this morning. Her boyfriend's family was here recently and his grandmother accidentally took her medication bag when she left to go home to [place]. She's bringing it back on 8/09, but that means that she doesn't have hydrocodone or alprazolam.  She called for pain medication today, but hadn't picked it up yet. On her way to work, her bleeding became very heavy and a clot ran down her leg, so she came here for evaluation rather that just picking up the prescription.   Requests a prescription for oxycodone to take during her periods and then the hydrocodone to take the resot of the time. Tramadol doesn't work at all, "that's like a joke that the gynecologist tried to give me like I had normal cramps like everyone else, and that I was lucky that they were giving it to me."  Sees Dr. Toy Care for her psychiatric problems. Has what she calls "black outs." Has had 2  episodes. The first was when she became psychotic in 01/2015. At the time, it was thought a result of Doxepin, but she's had another episode since stopping it. No recollection of her hosptial or Behavioral Health stay. 2 weeks ago, had an episode where "I'm not making any sense. I'm seeing snakes and people. I see people holding [my boyfriend] hostage. Terrors." She doesn't know what she was doing, what her behavior was like, and doesn't want to know. Lasted for several days, of which she has no memory. Brian's (her boyfriend) mother came and got her, gave her a shower and made her lay down. "If I have one of these spells at work, they'll fire me." Vision changes episodically. Episodic shaking. Very nervous. Afraid of other people. "Shaking is an early sign of Parkinson's." "I refuse to take anything else from Dr. Toy Care. Everything she's given me has made me sick. I don't trust her to gve me something to fix it. And if she does, it'll make me sick and given me migraines. I don't want to be her [place] pig" "She's not listening to me." "The whole reason I went to her was to control my PMDD." Doesn't like the Seroquel. Reports that she was on 800 mg, and tapered herself off, then experienced withdrawal symptoms, but reports that Dr. Toy Care told her it was a virus. Missed 13 days of work due to feeling so bad. The Seroquel was restarted when she was hospitalized in  04-Mar-2023.   She's had 2 unexcused absences from work this year. "They're trying to fire me." She was out of work for the month of 04-Mar-2023. Dealing with the death of both her grandparents.   Nerve pain, tingling that she's had before in the LEFT forearm now also happening in the RIGHT (elbow to fingers, epecially the thumb, index and middle fingers).   Feeling episodically "lightheaded, not a lot, but enough to scare me."   She uses marijuana, but no other illicit drugs. "It's what keeps me from going crazy."   Review of Systems As above. No CP, SOB,  nausea, vomiting or diarrhea.    Patient Active Problem List   Diagnosis Date Noted  . 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 MDD (major depressive disorder), recurrent, severe, with psychosis 01/19/2015  . AKI (acute kidney injury) 01/18/2015  . Hypokalemia 01/18/2015  . Migraine without aura   . IUD (intrauterine device) in place 06/02/2013  . Hypothyroid 12/09/2012  . Finger fracture, left   . Chronic left shoulder pain   . Mood disorder 03/25/2012  . AR (allergic rhinitis) 03/25/2012  . Dysmenorrhea 03/25/2012  . PMDD (premenstrual dysphoric disorder) 03/25/2012  . Chronic insomnia 03/25/2012  . Migraine      Prior to Admission medications   Medication Sig Start Date End Date Taking? Authorizing Provider  albuterol (PROVENTIL HFA;VENTOLIN HFA) 108 (90 BASE) MCG/ACT inhaler Inhale 2 puffs into the lungs every 4 (four) hours as needed for wheezing (cough, shortness of breath or wheezing.). 08/24/14 01/06/16 Yes Kaileah Shevchenko, PA-C  HYDROcodone-acetaminophen (NORCO) 10-325 MG per tablet Take 1 tablet by mouth every 8 (eight) hours as needed for severe pain. 03/09/15  Yes Lonzo Saulter, PA-C  HYDROcodone-acetaminophen (NORCO) 7.5-325 MG per tablet Take 1 tablet by mouth every 8 (eight) hours as needed for moderate pain or severe pain. 03/31/15  Yes Gladyse Corvin, PA-C  levothyroxine (SYNTHROID, LEVOTHROID) 50 MCG tablet TAKE 1 TABLET (50 MCG TOTAL) BY MOUTH DAILY. 08/23/14  Yes Dee Paden, PA-C  lidocaine (LIDODERM) 5 % Place 2 patches onto the skin daily. Remove and discard patch within 12 hours. PATIENT NEEDS OFFICE VISIT FOR ADDITIONAL REFILLS 03/03/15  Yes Lamberto Dinapoli, PA-C  Multiple Vitamins-Minerals (MULTIVITAMINS THER. W/MINERALS) TABS Take 1 tablet by mouth daily.    Yes Historical Provider, MD  pregabalin (LYRICA) 200 MG capsule Take 1 capsule (200 mg total) by mouth 3 (three) times daily. 01/21/15  Yes Shuvon B Rankin, NP  QUEtiapine (SEROQUEL) 400 MG tablet  Take 1 tablet (400 mg total) by mouth at bedtime. 01/21/15  Yes Shuvon B Rankin, NP  hydrOXYzine (ATARAX/VISTARIL) 25 MG tablet Take 1 tablet (25 mg total) by mouth every 6 (six) hours as needed (anxiety/agitation). Patient not taking: Reported on 03/31/2015 01/21/15   Mercy Moore Rankin, NP     Allergies  Allergen Reactions  . Penicillins     unknown  . Progestins     Extreme moodiness  . Sinequan [Doxepin] Other (See Comments) and Hypertension    Hallucinations, delusions, severe agitation       Objective:  Physical Exam  Constitutional: She is oriented to person, place, and time. She appears well-developed and well-nourished. She is active and cooperative. No distress.  BP 120/76 mmHg  Pulse 80  Temp(Src) 98.4 F (36.9 C) (Oral)  Resp 16  Ht 5\' 8"  (1.727 m)  Wt 167 lb 12.8 oz (76.114 kg)  BMI 25.52 kg/m2  SpO2 98%  LMP 03/30/2015   Eyes: Conjunctivae are  normal. No scleral icterus.  Neck: No thyromegaly present.  Cardiovascular: Normal rate, regular rhythm, normal heart sounds and intact distal pulses.   Pulmonary/Chest: Effort normal and breath sounds normal.  Lymphadenopathy:    She has no cervical adenopathy.  Neurological: She is alert and oriented to person, place, and time.  Skin: Skin is warm and dry.  Psychiatric: Her speech is normal and behavior is normal. Her mood appears not anxious. Her affect is not angry, not blunt, not labile and not inappropriate. Thought content is not paranoid and not delusional. Cognition and memory are not impaired. She does not express impulsivity or inappropriate judgment. She exhibits a depressed mood. She expresses no homicidal and no suicidal ideation.      Results for orders placed or performed in visit on 03/31/15  POCT CBC  Result Value Ref Range   WBC 6.0 4.6 - 10.2 K/uL   Lymph, poc 2.0 0.6 - 3.4   POC LYMPH PERCENT 33.2 10 - 50 %L   MID (cbc) 0.4 0 - 0.9   POC MID % 6.0 0 - 12 %M   POC Granulocyte 3.6 2 - 6.9    Granulocyte percent 60.8 37 - 80 %G   RBC 4.42 4.04 - 5.48 M/uL   Hemoglobin 13.1 12.2 - 16.2 g/dL   HCT, POC 39.2 37.7 - 47.9 %   MCV 88.5 80 - 97 fL   MCH, POC 29.7 27 - 31.2 pg   MCHC 33.5 31.8 - 35.4 g/dL   RDW, POC 15.1 %   Platelet Count, POC 252 142 - 424 K/uL   MPV 9.0 0 - 99.8 fL  POCT glucose (manual entry)  Result Value Ref Range   POC Glucose 96 70 - 99 mg/dl        Assessment & Plan:   1. Dysmenorrhea 2. Menorrhagia with regular cycle She will schedule with Dr. Glo Herring to discuss hysterectomy. I suspect many of her symptoms will improve with hysterectomy.  - POCT CBC - Ambulatory referral to Pain Clinic - oxyCODONE-acetaminophen (PERCOCET) 7.5-325 MG per tablet; Take 1 tablet by mouth every 8 (eight) hours as needed for severe pain.  Dispense: 60 tablet; Refill: 0   3. PMDD (premenstrual dysphoric disorder) She'll schedule with Dr. Glo Herring and a new psychiatrist. - Ambulatory referral to Psychiatry  4. Hypothyroidism, unspecified hypothyroidism type Euthyroid 8 weeks ago. Update to make sure current treatment is appropriate. - TSH  5. Chronic insomnia This is related to both her mood disorder and dysmenorrhea. Since she doesn't have her alprazolam, I agree to prescribe Valium for the next 20 days, until she gets her supply back from Brian's grandmother, then re-evaluate. - diazepam (VALIUM) 10 MG tablet; Take 1 tablet (10 mg total) by mouth every 6 (six) hours as needed for anxiety.  Dispense: 80 tablet; Refill: 0  6. MDD (major depressive disorder), recurrent, severe, with psychosis See above. - Ambulatory referral to Psychiatry - diazepam (VALIUM) 10 MG tablet; Take 1 tablet (10 mg total) by mouth every 6 (six) hours as needed for anxiety.  Dispense: 80 tablet; Refill: 0  7. Chronic left shoulder pain - Ambulatory referral to Pain Clinic  8. Intractable migraine without aura and with status migrainosus She will self-schedule with neurology.  9. Mood  disorder See above. - Ambulatory referral to Psychiatry  10. Lightheadedness Rest, hydrate. - POCT glucose (manual entry)  Lengthy discussion about appropriate use of controlled substances, marijuana, engaging specialists to managed her complicated problems. We will discuss the  next steps in 20 days, but ultimately plan transition to specialty care for her chronic pain, mood disorder and dysmenorrhea.  Fara Chute, PA-C Physician Assistant-Certified [facility]

## 2015-03-31 NOTE — Telephone Encounter (Signed)
Pt needs 10 pills of her HYDROcodone-acetaminophen (NORCO) 10-325 MG per tablet [[id number]] prescription. She would like the 7.5 version, or her insurance won't cover the cost.  She left her pills at her boyfriend's mother's house, and she lives an hour away. There was a death in the family. Her job is on the line. She needs these pills to get through work. Please advise at [phone]

## 2015-03-31 NOTE — Telephone Encounter (Signed)
Meds ordered this encounter  Medications  . HYDROcodone-acetaminophen (NORCO) 7.5-325 MG per tablet    Sig: Take 1 tablet by mouth every 8 (eight) hours as needed for moderate pain or severe pain.    Dispense:  10 tablet    Refill:  0    Order Specific Question:  Supervising Provider    Answer:  DOOLITTLE, ROBERT P [[id number]]    Printed.

## 2015-03-31 NOTE — Patient Instructions (Addendum)
You need to either continue with Dr. Toy Care or change to another psychiatrist. Barbara Rogers made a referral. I've referred you to Pain Management-it will take several months to get an appointment. You will need to stop using marijuana. Please schedule with Dr. Glo Herring to discuss hysterectomy. Please schedule with a neurologist regarding the headaches and lightheadedness (I recommend [facility])

## 2015-04-01 LAB — TSH: TSH: 1.016 u[IU]/mL (ref 0.350–4.500)

## 2015-04-03 ENCOUNTER — Encounter: Payer: Self-pay | Admitting: Physician Assistant

## 2015-04-03 NOTE — Telephone Encounter (Signed)
Left message Rx ready to pick up.

## 2015-04-05 ENCOUNTER — Encounter: Payer: Self-pay | Admitting: Physician Assistant

## 2015-04-06 ENCOUNTER — Encounter: Payer: Self-pay | Admitting: Physician Assistant

## 2015-04-07 ENCOUNTER — Encounter: Payer: Self-pay | Admitting: Physician Assistant

## 2015-04-11 MED ORDER — HYDROCODONE-ACETAMINOPHEN 10-325 MG PO TABS
1.0000 | ORAL_TABLET | Freq: Three times a day (TID) | ORAL | Status: DC | PRN
Start: 2015-04-11 — End: 2015-05-10

## 2015-04-11 NOTE — Telephone Encounter (Signed)
Patient notified via My Chart.  Meds ordered this encounter  Medications  . HYDROcodone-acetaminophen (NORCO) 10-325 MG per tablet    Sig: Take 1 tablet by mouth every 8 (eight) hours as needed.    Dispense:  50 tablet    Refill:  0    Order Specific Question:  Supervising Provider    Answer:  DOOLITTLE, ROBERT P [[id number]]

## 2015-04-12 ENCOUNTER — Telehealth: Payer: Self-pay

## 2015-04-12 ENCOUNTER — Encounter: Payer: Self-pay | Admitting: Physician Assistant

## 2015-04-12 NOTE — Telephone Encounter (Signed)
Patient needs the letter about her being out of work faxed into her job @ [phone] please have [name] sign it before faxing it in. On the letter please list the case# [id number] and her employee Id # [id number]. Call patient if you have any questions.

## 2015-04-12 NOTE — Telephone Encounter (Signed)
Letter made, signed by [name], and faxed.

## 2015-04-13 ENCOUNTER — Telehealth: Payer: Self-pay

## 2015-04-13 ENCOUNTER — Other Ambulatory Visit: Payer: Self-pay | Admitting: Physician Assistant

## 2015-04-13 NOTE — Telephone Encounter (Signed)
[facility] called. Was concerned that pt is wanting to get Hydrocodone filled today after filling Oxycodone on 7/22. After talking with [name] and the pt, sounds like she takes the Oxycodone for break through pain when she is on her period and and takes Hydrocodone the rest of the time. LM with [facility] that this is ok to fill.  Pt called back and states she will get this filled at [facility] [place]. If they call, ok to fill per [name]. Thanks

## 2015-04-17 NOTE — Telephone Encounter (Signed)
Faxed

## 2015-04-18 ENCOUNTER — Other Ambulatory Visit: Payer: Self-pay

## 2015-04-18 NOTE — Telephone Encounter (Signed)
RF req for Lyrica from [facility] [place]. I called and had them transfer from [facility].

## 2015-04-26 ENCOUNTER — Telehealth: Payer: Self-pay

## 2015-04-26 NOTE — Telephone Encounter (Signed)
[name] should be the one to write this note, she will be in clinic tomorrow. Will forward to Violet.

## 2015-04-26 NOTE — Telephone Encounter (Signed)
Pt is needing a note to be out of work Wednesday thru Friday to return on Monday pt is seeing her gyn on 05/01/15 this is an on going thing

## 2015-04-26 NOTE — Telephone Encounter (Signed)
Can we write note? 

## 2015-04-27 NOTE — Telephone Encounter (Signed)
Yes, ok to do note.  Thank you.

## 2015-04-27 NOTE — Telephone Encounter (Signed)
Letter ready to be picked up. Pt notified on voicemail.

## 2015-05-01 ENCOUNTER — Encounter: Payer: BLUE CROSS/BLUE SHIELD | Admitting: Obstetrics and Gynecology

## 2015-05-03 NOTE — Telephone Encounter (Signed)
Patient is calling because she would like the form to be faxed instead. Fax# [phone] Attn: MBEM#[id number] She also wants her employee number included: [id number]

## 2015-05-04 ENCOUNTER — Encounter: Payer: Self-pay | Admitting: Physician Assistant

## 2015-05-04 NOTE — Telephone Encounter (Signed)
Letter faxed.

## 2015-05-07 ENCOUNTER — Other Ambulatory Visit: Payer: Self-pay | Admitting: Physician Assistant

## 2015-05-08 ENCOUNTER — Encounter: Payer: Self-pay | Admitting: Obstetrics and Gynecology

## 2015-05-08 ENCOUNTER — Ambulatory Visit (INDEPENDENT_AMBULATORY_CARE_PROVIDER_SITE_OTHER): Payer: BLUE CROSS/BLUE SHIELD | Admitting: Obstetrics and Gynecology

## 2015-05-08 VITALS — BP 120/82 | Ht 67.0 in | Wt 171.0 lb

## 2015-05-08 DIAGNOSIS — N943 Premenstrual tension syndrome: Secondary | ICD-10-CM

## 2015-05-08 DIAGNOSIS — N946 Dysmenorrhea, unspecified: Secondary | ICD-10-CM | POA: Diagnosis not present

## 2015-05-08 DIAGNOSIS — F3281 Premenstrual dysphoric disorder: Secondary | ICD-10-CM

## 2015-05-08 NOTE — Progress Notes (Signed)
Patient ID: Barbara Rogers, female   DOB: May 16, 1981, 34 y.o.   MRN: [id number] Pt here today to discuss her periods and some problems that she is having. Pt states that she would really like to discuss her heavy bleeding and pain that she has with her periods. Pt states that she has had two periods a month for the past two months. Pt states that she has to stay out of work at least a week every month.

## 2015-05-08 NOTE — Addendum Note (Signed)
Addended by: Jonnie Kind on: 05/08/2015 03:23 PM   Modules accepted: Orders

## 2015-05-08 NOTE — Progress Notes (Signed)
Patient ID: Barbara Rogers, female   DOB: 1980/12/14, 34 y.o.   MRN: [id number] Patient ID: Barbara Rogers, female   DOB: 01-24-1981, 34 y.o.   MRN: [id number]   [facility] Visit  Patient name: Barbara Rogers MRN [id number]  Date of birth: 1980/12/22  CC & HPI:  Barbara Rogers is a 34 y.o. female presenting today for chronic heavy and painful menstrual cycles originally onset at age of 78. Pt reports having two menstrual periods a month for the past two months. Pt further complains of drastic mood changes and increased depression during her menstrual cycle.  Pt states that her Sx have caused her to miss work for 3-4 days every month.   Pt reports prior use of multiple oral contraceptives and NuvaRing without improvement of her Sx. Pt notes that cymbalta also resulted in anorexia. Pt reports she is currently on IUD for contraception. Pt states she never wants to have children.   Pt reports her last menstrual cycle began on 05/01/15.   ROS:  10 Systems reviewed and all are negative for acute change except as noted in the HPI.  Pertinent History Reviewed:   Reviewed: Significant for dysmenorrhea, 3 abortions, and PMDD.  Medical         Past Medical History  Diagnosis Date  . Allergy   . Migraine without aura   . Anxiety   . Depression   . Dysmenorrhea   . PMDD (premenstrual dysphoric disorder)   . Insomnia   . Nephrolithiasis   . Finger fracture, left 10/31/2012    LEFT 4th finger  . Chronic left shoulder pain 08/2012  . Asthma     exacerbated by bronchitisi                              Surgical Hx:   History reviewed. No pertinent past surgical history. Medications: Reviewed & Updated - see associated section                       Current outpatient prescriptions:  .  HYDROcodone-acetaminophen (NORCO) 10-325 MG per tablet, Take 1 tablet by mouth every 8 (eight) hours as needed., Disp: 50 tablet, Rfl: 0 .  levothyroxine (SYNTHROID, LEVOTHROID) 50 MCG tablet, TAKE 1 TABLET  (50 MCG TOTAL) BY MOUTH DAILY., Disp: 90 tablet, Rfl: 1 .  lidocaine (LIDODERM) 5 %, Place 2 patches onto the skin daily. Remove and discard patch within 12 hours. PATIENT NEEDS OFFICE VISIT FOR ADDITIONAL REFILLS, Disp: 60 patch, Rfl: 0 .  LYRICA 200 MG capsule, TAKE ONE CAPSULE BY MOUTH 3 TIMES A DAY, Disp: 270 capsule, Rfl: 1 .  Multiple Vitamins-Minerals (MULTIVITAMINS THER. W/MINERALS) TABS, Take 1 tablet by mouth daily. , Disp: , Rfl:  .  QUEtiapine (SEROQUEL) 400 MG tablet, Take 1 tablet (400 mg total) by mouth at bedtime., Disp: 30 tablet, Rfl: 0 .  diazepam (VALIUM) 10 MG tablet, Take 1 tablet (10 mg total) by mouth every 6 (six) hours as needed for anxiety. (Patient not taking: Reported on 05/08/2015), Disp: 80 tablet, Rfl: 0   Social History: Reviewed -  reports that she has been smoking Cigarettes.  She has a 15 pack-year smoking history. She has never used smokeless tobacco.  Objective Findings:  Vitals: Blood pressure 120/82, height 5\' 7"  (1.702 m), weight 171 lb (77.565 kg), last menstrual period 05/03/2015. Office visit only.   Assessment & Plan:  A:  1. Dysmenorrhea 2. PMMD  P:  1. consider laparoscopic tubal sterilization and endometrial ablation  2. Complete ultrasound in 2 weeks.  Would need exam , and discussion of u/s prior to decision of endo ablation / btl 3. FMLA papers for missing last 3 days due to menses and pain   This chart was SCRIBED for Mallory Shirk, MD by Terressa Koyanagi, ED Scribe. This patient was seen in room 1, and the patient's care was started at 2:48 PM.  I personally performed the services described in this documentation, which was SCRIBED in my presence. The recorded information has been reviewed and considered accurate. It has been edited as necessary during review. Jonnie Kind, MD

## 2015-05-08 NOTE — Progress Notes (Deleted)
Patient ID: Barbara Rogers, female   DOB: 04-15-1981, 34 y.o.   MRN: [id number]   [facility] Visit  Patient name: Barbara Rogers MRN [id number]  Date of birth: 06/20/1981  CC & HPI:  Barbara Rogers is a 34 y.o. female presenting today for chronic heavy and painful menstrual cycles originally onset at age of 43. Pt reports having two menstrual periods a month for the past two months. Pt further complains of drastic mood changes and increased depression during her menstrual cycle.  Pt states that her Sx have caused her to miss work for 3-4 days every month.   Pt reports prior use of multiple oral contraceptives and NuvaRing without improvement of her Sx. Pt notes that cymbalta also resulted in anorexia. Pt reports she is currently on IUD for contraception. Pt states she never wants to have children.   Pt reports her last menstrual cycle began on 05/01/15.   ROS:  10 Systems reviewed and all are negative for acute change except as noted in the HPI.  Pertinent History Reviewed:   Reviewed: Significant for dysmenorrhea, 3 abortions, and PMDD.  Medical         Past Medical History  Diagnosis Date   Allergy    Migraine without aura    Anxiety    Depression    Dysmenorrhea    PMDD (premenstrual dysphoric disorder)    Insomnia    Nephrolithiasis    Finger fracture, left 10/31/2012    LEFT 4th finger   Chronic left shoulder pain 08/2012   Asthma     exacerbated by bronchitisi                              Surgical Hx:   History reviewed. No pertinent past surgical history. Medications: Reviewed & Updated - see associated section                       Current outpatient prescriptions:    HYDROcodone-acetaminophen (NORCO) 10-325 MG per tablet, Take 1 tablet by mouth every 8 (eight) hours as needed., Disp: 50 tablet, Rfl: 0   levothyroxine (SYNTHROID, LEVOTHROID) 50 MCG tablet, TAKE 1 TABLET (50 MCG TOTAL) BY MOUTH DAILY., Disp: 90 tablet, Rfl: 1   lidocaine (LIDODERM)  5 %, Place 2 patches onto the skin daily. Remove and discard patch within 12 hours. PATIENT NEEDS OFFICE VISIT FOR ADDITIONAL REFILLS, Disp: 60 patch, Rfl: 0   LYRICA 200 MG capsule, TAKE ONE CAPSULE BY MOUTH 3 TIMES A DAY, Disp: 270 capsule, Rfl: 1   Multiple Vitamins-Minerals (MULTIVITAMINS THER. W/MINERALS) TABS, Take 1 tablet by mouth daily. , Disp: , Rfl:    QUEtiapine (SEROQUEL) 400 MG tablet, Take 1 tablet (400 mg total) by mouth at bedtime., Disp: 30 tablet, Rfl: 0   diazepam (VALIUM) 10 MG tablet, Take 1 tablet (10 mg total) by mouth every 6 (six) hours as needed for anxiety. (Patient not taking: Reported on 05/08/2015), Disp: 80 tablet, Rfl: 0   Social History: Reviewed -  reports that she has been smoking Cigarettes.  She has a 15 pack-year smoking history. She has never used smokeless tobacco.  Objective Findings:  Vitals: Blood pressure 120/82, height 5\' 7"  (1.702 m), weight 171 lb (77.565 kg), last menstrual period 05/03/2015. Office visit only.   Assessment & Plan:   A:  1. Dysmenorrhea 2. PMMD  P:  1. consider laparoscopic tubal sterilization and  endometrial ablation  2. Complete ultrasound in 2 weeks.  Would need exam , and discussion of u/s prior to decision of endo ablation / btl 3. FMLA papers for missing last 3 days due to menses and pain   This chart was SCRIBED for Mallory Shirk, MD by Terressa Koyanagi, ED Scribe. This patient was seen in room 1, and the patient's care was started at 2:48 PM.  I personally performed the services described in this documentation, which was SCRIBED in my presence. The recorded information has been reviewed and considered accurate. It has been edited as necessary during review. Jonnie Kind, MD

## 2015-05-09 ENCOUNTER — Encounter: Payer: Self-pay | Admitting: Physician Assistant

## 2015-05-10 MED ORDER — HYDROCODONE-ACETAMINOPHEN 10-325 MG PO TABS
1.0000 | ORAL_TABLET | Freq: Three times a day (TID) | ORAL | Status: DC | PRN
Start: 2015-05-10 — End: 2015-05-26

## 2015-05-10 NOTE — Telephone Encounter (Signed)
Patient notified via My Chart.  Meds ordered this encounter  Medications  . HYDROcodone-acetaminophen (NORCO) 10-325 MG per tablet    Sig: Take 1 tablet by mouth every 8 (eight) hours as needed.    Dispense:  50 tablet    Refill:  0    Order Specific Question:  Supervising Provider    Answer:  Tami Lin P [[id number]]    I believe that there is also a refill request from 8/28? I couldn't get to it to authorize it from there. Please decline that one due to addressing it by another means.

## 2015-05-19 ENCOUNTER — Encounter: Payer: Self-pay | Admitting: Physician Assistant

## 2015-05-19 ENCOUNTER — Encounter: Payer: Self-pay | Admitting: Obstetrics and Gynecology

## 2015-05-19 ENCOUNTER — Other Ambulatory Visit: Payer: Self-pay | Admitting: Obstetrics and Gynecology

## 2015-05-19 DIAGNOSIS — N946 Dysmenorrhea, unspecified: Secondary | ICD-10-CM

## 2015-05-19 DIAGNOSIS — F3281 Premenstrual dysphoric disorder: Secondary | ICD-10-CM

## 2015-05-22 ENCOUNTER — Ambulatory Visit (INDEPENDENT_AMBULATORY_CARE_PROVIDER_SITE_OTHER): Payer: BLUE CROSS/BLUE SHIELD

## 2015-05-22 ENCOUNTER — Telehealth: Payer: Self-pay | Admitting: Obstetrics and Gynecology

## 2015-05-22 ENCOUNTER — Telehealth: Payer: Self-pay | Admitting: Physician Assistant

## 2015-05-22 ENCOUNTER — Encounter: Payer: Self-pay | Admitting: Obstetrics and Gynecology

## 2015-05-22 DIAGNOSIS — N943 Premenstrual tension syndrome: Secondary | ICD-10-CM

## 2015-05-22 DIAGNOSIS — N946 Dysmenorrhea, unspecified: Secondary | ICD-10-CM

## 2015-05-22 DIAGNOSIS — F3281 Premenstrual dysphoric disorder: Secondary | ICD-10-CM

## 2015-05-22 NOTE — Progress Notes (Addendum)
US PELVIC US TA/TV: heterogenous anteverted uterus w/ mult fibroids, #1) ant rt subserosal fibroid 1.5 x 1.8 x 1.1cm,(#2) post intramural fibroid 1.6 x 1.2 x 1.9cm,normal ov's bilat,nabothian cyst 7.5 x 4.7 x 9.2 mm,EEC 7.34mm,IUD centrally located w/in endometrium.

## 2015-05-22 NOTE — Telephone Encounter (Signed)
Plan is to do a bilateral salpingectomy and uterine ablation. Wants to know if this plan will take care of her problems.  Also, the most recent Rx for Norco on 05/10/2015 was for #50 tablets, since I used the smaller quantity prescription to refill. When she exhausts this supply, she will request a fill, and remind me that she needs the #90 for a 30-day supply.

## 2015-05-22 NOTE — Telephone Encounter (Signed)
From My Chart: I Chelly  I don't know if this is normal but there is really no need for me to come in. I wanted to see if you find time tomorrow or if you work this weekend. my phone number is [phone]. it will probably take 10 minutes tops. I wanted to discuss my medication and this surgery I'm about to have for some of my g7n problems. I talked with Dr Glo Herring but I have a couple of questions and in want to see if this procedure will help me like I need to be helped  I just didn't want to get a message from a nurse. please call if you can. thanks for everything. have a blessed day. you can call anytime early or late.       Left message for patient to return my call.

## 2015-05-22 NOTE — Telephone Encounter (Signed)
I spoke with the pt and advised the pt that she would have to be seen by Dr. Glo Herring for a pre op exam before her surgery could be scheduled and that she would have to make an appointment. I spoke with Dr. Glo Herring and he is going to give the pt a note for work and we will have to schedule her after he comes back from leave. I will explain this to the pt and get her scheduled to see him as soon as he comes back.

## 2015-05-24 ENCOUNTER — Telehealth: Payer: Self-pay | Admitting: Obstetrics and Gynecology

## 2015-05-24 ENCOUNTER — Encounter: Payer: Self-pay | Admitting: Physician Assistant

## 2015-05-24 ENCOUNTER — Encounter: Payer: Self-pay | Admitting: Obstetrics & Gynecology

## 2015-05-24 NOTE — Telephone Encounter (Signed)
I did the letter, you will need to print it off

## 2015-05-24 NOTE — Telephone Encounter (Signed)
Pt states that she wants her work note extended for one more. Pt states that she needs the note to say, that she was out of work 9/9-9/13, return on the 14th. Pt wants the note to extended another day! Pt states that she is not bleeding heavy anymore just spotting.

## 2015-05-25 ENCOUNTER — Encounter: Payer: Self-pay | Admitting: Physician Assistant

## 2015-05-25 ENCOUNTER — Telehealth: Payer: Self-pay | Admitting: Obstetrics and Gynecology

## 2015-05-25 NOTE — Telephone Encounter (Signed)
Pt aware that Dr. Elonda Husky has written the note for her to return to work on Friday. Pt asked to be taken out of work until Monday and I advised the pt that Dr Elonda Husky would probably not give her that long out of work. Will let pt know note is in front office waiting for her.

## 2015-05-25 NOTE — Telephone Encounter (Signed)
Spoke with pt and letter put in front office.
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
## 2015-05-26 ENCOUNTER — Telehealth: Payer: Self-pay

## 2015-05-26 DIAGNOSIS — F333 Major depressive disorder, recurrent, severe with psychotic symptoms: Secondary | ICD-10-CM

## 2015-05-26 DIAGNOSIS — F5104 Psychophysiologic insomnia: Secondary | ICD-10-CM

## 2015-05-26 MED ORDER — HYDROCODONE-ACETAMINOPHEN 10-325 MG PO TABS: 1.0000 | ORAL_TABLET | Freq: Three times a day (TID) | ORAL | Status: DC | PRN

## 2015-05-26 MED ORDER — DIAZEPAM 10 MG PO TABS: 10.0000 mg | ORAL_TABLET | Freq: Four times a day (QID) | ORAL | Status: DC | PRN

## 2015-05-26 NOTE — Telephone Encounter (Signed)
Patient also wants to know if she can also get a refill for valium

## 2015-05-26 NOTE — Telephone Encounter (Signed)
Barbara Rogers spoke with pt to let her know.
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
## 2015-05-26 NOTE — Telephone Encounter (Signed)
Patient notified via My Chart.  Meds ordered this encounter  Medications  . HYDROcodone-acetaminophen (NORCO) 10-325 MG per tablet    Sig: Take 1 tablet by mouth every 8 (eight) hours as needed.    Dispense:  90 tablet    Refill:  0    Order Specific Question:  Supervising Provider    Answer:  DOOLITTLE, ROBERT P [[id number]]

## 2015-05-26 NOTE — Telephone Encounter (Signed)
Letter in pick up draw.

## 2015-05-26 NOTE — Telephone Encounter (Signed)
I have replied to the patient, and printed a prescription for her pain medication.  OK to provide her a work note.

## 2015-05-31 ENCOUNTER — Encounter: Payer: Self-pay | Admitting: Physician Assistant

## 2015-06-01 ENCOUNTER — Encounter: Payer: Self-pay | Admitting: Physician Assistant

## 2015-06-01 NOTE — Telephone Encounter (Signed)
Letter printed and signed at 104. Will bring to 102 after clinic.  Patient notified via My Chart.

## 2015-06-02 ENCOUNTER — Encounter: Payer: Self-pay | Admitting: Physician Assistant

## 2015-06-05 ENCOUNTER — Encounter: Payer: Self-pay | Admitting: Obstetrics and Gynecology

## 2015-06-05 ENCOUNTER — Encounter: Payer: BLUE CROSS/BLUE SHIELD | Admitting: Obstetrics and Gynecology

## 2015-06-07 ENCOUNTER — Telehealth: Payer: Self-pay | Admitting: Obstetrics and Gynecology

## 2015-06-07 NOTE — Telephone Encounter (Signed)
Pt states that she has had a lot of problems with her periods. Pt states that she has not been at work since last Friday and wants a note to return to work on Monday. Pt states that the note needs to say she was out on Monday thru this Friday, and may return to work next Monday.   Pt states that she is having multiple periods a month and it is really taking a toll on her. Pt aware Dr. Glo Herring would be out of the office until Friday and it would probably be then before she hears from [place]. Pt verbalized understanding.

## 2015-06-08 NOTE — Telephone Encounter (Signed)
Work note printed for patient.

## 2015-06-10 ENCOUNTER — Telehealth: Payer: Self-pay | Admitting: Physician Assistant

## 2015-06-10 ENCOUNTER — Ambulatory Visit (INDEPENDENT_AMBULATORY_CARE_PROVIDER_SITE_OTHER): Payer: BLUE CROSS/BLUE SHIELD | Admitting: Physician Assistant

## 2015-06-10 ENCOUNTER — Ambulatory Visit (INDEPENDENT_AMBULATORY_CARE_PROVIDER_SITE_OTHER): Payer: BLUE CROSS/BLUE SHIELD

## 2015-06-10 VITALS — BP 92/50 | HR 109 | Temp 98.8°F | Resp 16 | Wt 180.0 lb

## 2015-06-10 DIAGNOSIS — M79605 Pain in left leg: Secondary | ICD-10-CM | POA: Diagnosis not present

## 2015-06-10 DIAGNOSIS — R202 Paresthesia of skin: Secondary | ICD-10-CM | POA: Diagnosis not present

## 2015-06-10 DIAGNOSIS — Z114 Encounter for screening for human immunodeficiency virus [HIV]: Secondary | ICD-10-CM | POA: Diagnosis not present

## 2015-06-10 DIAGNOSIS — M79604 Pain in right leg: Secondary | ICD-10-CM

## 2015-06-10 DIAGNOSIS — R600 Localized edema: Secondary | ICD-10-CM | POA: Diagnosis not present

## 2015-06-10 DIAGNOSIS — L989 Disorder of the skin and subcutaneous tissue, unspecified: Secondary | ICD-10-CM | POA: Diagnosis not present

## 2015-06-10 LAB — POCT CBC
Granulocyte percent: 59.4 %G (ref 37–80)
HCT, POC: 39.1 % (ref 37.7–47.9)
HEMOGLOBIN: 12.2 g/dL (ref 12.2–16.2)
LYMPH, POC: 3.3 (ref 0.6–3.4)
MCH, POC: 28.5 pg (ref 27–31.2)
MCHC: 31.2 g/dL — AB (ref 31.8–35.4)
MCV: 91.3 fL (ref 80–97)
MID (cbc): 0.5 (ref 0–0.9)
MPV: 8.6 fL (ref 0–99.8)
POC Granulocyte: 5.5 (ref 2–6.9)
POC LYMPH %: 35.3 % (ref 10–50)
POC MID %: 5.3 % (ref 0–12)
Platelet Count, POC: 310 10*3/uL (ref 142–424)
RBC: 4.28 M/uL (ref 4.04–5.48)
RDW, POC: 14.9 %
WBC: 9.3 10*3/uL (ref 4.6–10.2)

## 2015-06-10 MED ORDER — DOXYCYCLINE HYCLATE 100 MG PO CAPS: 100.0000 mg | ORAL_CAPSULE | Freq: Two times a day (BID) | ORAL | Status: AC

## 2015-06-10 MED ORDER — PREDNISONE 20 MG PO TABS: ORAL_TABLET | ORAL | Status: DC
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
## 2015-06-10 NOTE — Patient Instructions (Signed)
Rest. Take the prednisone and doxycyline as prescribed. If the symptoms worsen, or if you develop fever, go to the ER.

## 2015-06-10 NOTE — Progress Notes (Signed)
Subjective:     Patient ID: Barbara Rogers, female   DOB: 12-03-1980, 34 y.o.   MRN: [id number] PCP: JEFFERY,[name], PA-C   Chief Complaint  Patient presents with  . Leg Pain    left leg  . Ulcers    Bilateral ankles    HPI  Patient presents today for evaluation of left leg pain. On Monday, she slept on the floor wrong. She was lying on the floor looking on her computer screen when she fell asleep. She woke up and could barely make it to her bed because her left leg was so numb, she could barely keep her balance. She did make it to her bed, and woke up at around 2 AM and went to the kitchen to get some water. She had to crawl back on her hands and knees to her bed. She couldn't get herself up into her bed, so she pulled the mattress and the covers onto the floor. She was unable to get herself out of bed for about a day because she was stuck between the wall and the mattress. Her cell phone was in the car so she couldn't call anyone. She yelled and yelled for help. Finally, her boyfriend and his mother decided something must be wrong because she was not answering the phone or posting on Facebook, so they went to her home and found her.   On Tuesday, her boyfriend helped her take a warm bath and this helped a little bit. Her left foot felt like it was "in an ice skating rink." She had to stay off of her left leg on Tuesday and Wednesday. Her hips feel tight and sore. Her left foot will go numb and tingly when she sits. The crease of her left knee hurts. Her left leg "basically hurts all over." She has been trying ice, heat, Tylenol, and extra Norco with no relief. She states that she has used Flexeril in the past for muscle spasms and it has not helped, and she wants something stronger for her pain. Her left leg feels tight, like she has been doing squats, but she has not been doing any physical activity recently.   She also has an ulcerated area on her right and left. She thinks it may be a spider  bite but she is unsure. She can't recall a specific injury/bite to the foot. It has caused her feett to swell. The lesions are not causing her pain.  A couple of weeks ago, she was lying in her recliner when it flipped with her still in it. She hit her head on the fireplace and as a result had a large knot for awhile on the back of her head. A friend told her that her leg pain may be related to this and the patient wanted to bring it up to make sure there was no relation between her head injury and her leg pain.   Review of Systems  Musculoskeletal: Negative for back pain.       Left leg pain  Skin: Positive for wound (Right and left foot have "bites").  See HPI   Patient Active Problem List   Diagnosis Date Noted  . 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 MDD (major depressive disorder), recurrent, severe, with psychosis 01/19/2015  . AKI (acute kidney injury) 01/18/2015  . Hypokalemia 01/18/2015  . Migraine without aura   . IUD (intrauterine device) in place 06/02/2013  . Hypothyroid 12/09/2012  . Finger fracture, left   . Chronic  left shoulder pain   . Mood disorder 03/25/2012  . AR (allergic rhinitis) 03/25/2012  . Dysmenorrhea 03/25/2012  . PMDD (premenstrual dysphoric disorder) 03/25/2012  . Chronic insomnia 03/25/2012  . Migraine      Prior to Admission medications   Medication Sig Start Date End Date Taking? Authorizing Provider  diazepam (VALIUM) 10 MG tablet Take 1 tablet (10 mg total) by mouth every 6 (six) hours as needed for anxiety. 05/26/15   [name] Jeffery, PA-C  HYDROcodone-acetaminophen (NORCO) 10-325 MG per tablet Take 1 tablet by mouth every 8 (eight) hours as needed. 05/26/15   [name] Jeffery, PA-C  levothyroxine (SYNTHROID, LEVOTHROID) 50 MCG tablet TAKE 1 TABLET (50 MCG TOTAL) BY MOUTH DAILY. 08/23/14   [name] Jeffery, PA-C  lidocaine (LIDODERM) 5 % Place 2 patches onto the skin daily. Remove and discard patch within 12 hours. PATIENT NEEDS OFFICE  VISIT FOR ADDITIONAL REFILLS 03/03/15   Harrison Mons, PA-C  LYRICA 200 MG capsule TAKE ONE CAPSULE BY MOUTH 3 TIMES A DAY 04/15/15   Mancel Bale, PA-C  Multiple Vitamins-Minerals (MULTIVITAMINS THER. W/MINERALS) TABS Take 1 tablet by mouth daily.     Historical Provider, MD  QUEtiapine (SEROQUEL) 400 MG tablet Take 1 tablet (400 mg total) by mouth at bedtime. 01/21/15   Shuvon B Rankin, NP    Allergies  Allergen Reactions  . Penicillins     unknown  . Progestins     Extreme moodiness  . Sinequan [Doxepin] Other (See Comments) and Hypertension    Hallucinations, delusions, severe agitation      Objective:  Physical Exam  Constitutional: She is oriented to person, place, and time. She appears well-developed and well-nourished.  HENT:  Head: Normocephalic and atraumatic.  Cardiovascular: Intact distal pulses.   Musculoskeletal:       Feet:  Decreased ROM of the left foot due to pain.  Neurological: She is alert and oriented to person, place, and time. She has normal reflexes. No sensory deficit.  Sensation to the distal extremities is intact. Strength of the distal extremities 5/5.  Skin: Skin is warm and dry.  Psychiatric: She has a normal mood and affect. Her behavior is normal. Thought content normal.    BP 92/50 mmHg  Pulse 109  Temp(Src) 98.8 F (37.1 C)  Resp 16  Wt 180 lb (81.647 kg)  SpO2 97%  LMP 05/17/2015   Results for orders placed or performed in visit on 06/10/15  POCT CBC  Result Value Ref Range   WBC 9.3 4.6 - 10.2 K/uL   Lymph, poc 3.3 0.6 - 3.4   POC LYMPH PERCENT 35.3 10 - 50 %L   MID (cbc) 0.5 0 - 0.9   POC MID % 5.3 0 - 12 %M   POC Granulocyte 5.5 2 - 6.9   Granulocyte percent 59.4 37 - 80 %G   RBC 4.28 4.04 - 5.48 M/uL   Hemoglobin 12.2 12.2 - 16.2 g/dL   HCT, POC 39.1 37.7 - 47.9 %   MCV 91.3 80 - 97 fL   MCH, POC 28.5 27 - 31.2 pg   MCHC 31.2 (A) 31.8 - 35.4 g/dL   RDW, POC 14.9 %   Platelet Count, POC 310 142 - 424 K/uL   MPV 8.6 0 -  99.8 fL    FINDINGS: Six lumbar type vertebral bodies which will be labeled T12 through L5 for the purposes of this study. Sacroiliac joints are symmetric. Intrauterine device. Maintenance of vertebral body height and alignment. Intervertebral  disc heights are maintained. Possible right-sided renal stone or stones.  IMPRESSION: No acute osseous abnormality.  Possible right nephrolithiasis.  Assessment & Plan:  1. Pain of left lower extremity - DG Lumbar Spine Complete; Future  2. Paresthesia of left leg No acute findings on X-ray. Prednisone taper. Recheck next Friday (10/07). - DG Lumbar Spine Complete; Future - predniSONE (DELTASONE) 20 MG tablet; Take 3 PO QAM x3days, 2 PO QAM x3days, 1 PO QAM x3days  Dispense: 18 tablet; Refill: 0  3. Skin lesion Normal white count. Doxycycline. Recheck next Friday (10/07).  - POCT CBC - doxycycline (VIBRAMYCIN) 100 MG capsule; Take 1 capsule (100 mg total) by mouth 2 (two) times daily.  Dispense: 20 capsule; Refill: 0  4. Bilateral edema of lower extremity Likely due to skin lesions on both feet. - Comprehensive metabolic panel  5. Screening for HIV (human immunodeficiency virus) - HIV antibody  Follow-up next Friday (10/07). If symptoms worsen in the meantime, go to the Emergency Room. If you have to miss work on Monday, you must be seen here on that day to receive a work note.     Haile Toppins D. Race, PA-S Physician Assistant Student [facility]

## 2015-06-10 NOTE — Telephone Encounter (Signed)
Pt. Asking for new Rx due to pain in leg (Valum is not working)/// Pt also request Out of work note for 09/26-9/30 to return to work 10/3.  [phone]

## 2015-06-10 NOTE — Telephone Encounter (Signed)
This is a new problem, so she'll need to be evaluated for this.

## 2015-06-11 LAB — COMPREHENSIVE METABOLIC PANEL
ALT: 84 U/L — ABNORMAL HIGH (ref 6–29)
AST: 62 U/L — AB (ref 10–30)
Albumin: 3.4 g/dL — ABNORMAL LOW (ref 3.6–5.1)
Alkaline Phosphatase: 81 U/L (ref 33–115)
BILIRUBIN TOTAL: 0.2 mg/dL (ref 0.2–1.2)
BUN: 15 mg/dL (ref 7–25)
CALCIUM: 8.7 mg/dL (ref 8.6–10.2)
CO2: 29 mmol/L (ref 20–31)
Chloride: 102 mmol/L (ref 98–110)
Creat: 0.83 mg/dL (ref 0.50–1.10)
GLUCOSE: 107 mg/dL — AB (ref 65–99)
POTASSIUM: 4.4 mmol/L (ref 3.5–5.3)
Sodium: 135 mmol/L (ref 135–146)
Total Protein: 5.7 g/dL — ABNORMAL LOW (ref 6.1–8.1)

## 2015-06-11 LAB — HIV ANTIBODY (ROUTINE TESTING W REFLEX): HIV 1&2 Ab, 4th Generation: NONREACTIVE

## 2015-06-12 ENCOUNTER — Telehealth: Payer: Self-pay

## 2015-06-12 ENCOUNTER — Encounter: Payer: Self-pay | Admitting: Physician Assistant

## 2015-06-12 NOTE — Telephone Encounter (Signed)
Pt was seen in the office.

## 2015-06-12 NOTE — Telephone Encounter (Signed)
Pt requesting an extended oow note for today,rtn to work tomorrow OCT 4 from chelle, Leg is better but not ready to push it!   Best phone for pt is [phone]

## 2015-06-13 ENCOUNTER — Encounter: Payer: Self-pay | Admitting: Physician Assistant

## 2015-06-13 ENCOUNTER — Telehealth: Payer: Self-pay

## 2015-06-13 NOTE — Telephone Encounter (Signed)
Spoke with Pamala Hurry. OK to extend note through today, RTW tomorrow, 10/05.

## 2015-06-13 NOTE — Telephone Encounter (Signed)
Ready to pick up.  

## 2015-06-13 NOTE — Telephone Encounter (Signed)
Done, see notes under 10/4 ph message.

## 2015-06-13 NOTE — Telephone Encounter (Signed)
Pt is needing her out of work note faxed to [phone] case [phone] on it and [id number] employee id

## 2015-06-13 NOTE — Telephone Encounter (Signed)
Barbara Rogers was speaking to pt while I was trying to get the letter to go through fax, and pt asked for another day off. Now needs note to read to RTW tomorrow 10/5. I called and OK'd it with [name] and will re-write note. I have tried 8 times to get fax to go through. Called and [facility] for pt that I am leaving a copy for her to p/up, or she can call w/alternate fax # to try.

## 2015-06-13 NOTE — Telephone Encounter (Signed)
Barbara Rogers is going to call you about pt.

## 2015-06-13 NOTE — Telephone Encounter (Signed)
OK for work note extension to Return to Work TODAY 06/13/2015.

## 2015-06-14 ENCOUNTER — Encounter: Payer: Self-pay | Admitting: Physician Assistant

## 2015-06-15 ENCOUNTER — Telehealth: Payer: Self-pay

## 2015-06-15 NOTE — Telephone Encounter (Signed)
Patient recently requested a work excuse note from 9/26-10/5. Patient now wants it faxed to her work [place]. Attn: case# [id number] Emp ID [id number] , Fax# [phone]

## 2015-06-16 NOTE — Telephone Encounter (Signed)
Called and spoke with pt and she stated that she is living in [place] now and she does not have insurance at this time and no money for copay or gas to get her to see chelle today.  I advised her of what [name] stated about the note saying 9/23-10/05 and return to work on 10/06 and the pt was fine with that and requested that this be faxed to her work with the case number on it.  [name] is this ok?  Pt did state that she will be making an appt with you, but did not say when.  thanks

## 2015-06-16 NOTE — Telephone Encounter (Signed)
Barbara Rogers,  Please print a new work note for [name], for 9/23-10/05 returning on 10/06.  She's to call you with the proper fax number.  However, she's also supposed to come in today for a recheck, but I am suspicious that she will not.

## 2015-06-16 NOTE — Telephone Encounter (Signed)
Pt is supposed to be coming in to see [name].

## 2015-06-16 NOTE — Telephone Encounter (Signed)
Pt says the wrong letter was faxed, she wants the RTW note to say 10/5 not 10/4. The fax #, case # and employee ID # are all correct from the previous message. She would like [name] to sign it.

## 2015-06-16 NOTE — Telephone Encounter (Signed)
Patient called and states that her return to work note should document that she was out of work from 06/05/2015-06/15/2015 and that she may return to work today 06/16/2015. Please fax to [phone]. Patient's case # is [phone] and her employee ID # is [id number].

## 2015-06-16 NOTE — Telephone Encounter (Signed)
Yes, this is ok.

## 2015-06-16 NOTE — Telephone Encounter (Signed)
The note can say 9/23-10/05, RTW 10/06.  I was VERY clear about the plan. She needs to come see me today, and we can discuss the note.  I am very disappointed that she has not followed the plan that we discussed-that she would return here on Monday 10/03 if she wasn't able to return to work, and has extended the note by one day every day this week.

## 2015-06-19 ENCOUNTER — Encounter: Payer: BLUE CROSS/BLUE SHIELD | Admitting: Obstetrics and Gynecology

## 2015-06-19 NOTE — Telephone Encounter (Signed)
Revised note faxed

## 2015-06-19 NOTE — Telephone Encounter (Signed)
Ok. I'll let Jonelle Sidle know. Please call and ask to speak with her. Make sure she has the correct fax number.  I'll ask her to go ahead and print a letter for 9/23-10/05, returning on 10/06.   Warmly,  [name]

## 2015-06-22 ENCOUNTER — Encounter: Payer: Self-pay | Admitting: Physician Assistant

## 2015-06-22 ENCOUNTER — Other Ambulatory Visit: Payer: Self-pay | Admitting: Physician Assistant

## 2015-06-22 ENCOUNTER — Encounter: Payer: Self-pay | Admitting: Obstetrics and Gynecology

## 2015-06-22 DIAGNOSIS — F5104 Psychophysiologic insomnia: Secondary | ICD-10-CM

## 2015-06-22 DIAGNOSIS — F333 Major depressive disorder, recurrent, severe with psychotic symptoms: Secondary | ICD-10-CM

## 2015-06-23 MED ORDER — CYCLOBENZAPRINE HCL 10 MG PO TABS: 10.0000 mg | ORAL_TABLET | Freq: Every day | ORAL | Status: DC

## 2015-06-24 NOTE — Addendum Note (Signed)
Addended by: Fara Chute on: 06/24/2015 05:28 PM   Modules accepted: Orders

## 2015-06-28 ENCOUNTER — Ambulatory Visit (INDEPENDENT_AMBULATORY_CARE_PROVIDER_SITE_OTHER): Payer: BLUE CROSS/BLUE SHIELD | Admitting: Physician Assistant

## 2015-06-28 ENCOUNTER — Ambulatory Visit (INDEPENDENT_AMBULATORY_CARE_PROVIDER_SITE_OTHER): Payer: BLUE CROSS/BLUE SHIELD

## 2015-06-28 VITALS — BP 112/72 | HR 92 | Temp 98.6°F | Resp 16 | Ht 67.0 in | Wt 169.0 lb

## 2015-06-28 DIAGNOSIS — M25562 Pain in left knee: Secondary | ICD-10-CM | POA: Diagnosis not present

## 2015-06-28 DIAGNOSIS — M25552 Pain in left hip: Secondary | ICD-10-CM

## 2015-06-28 LAB — POCT CBC
Granulocyte percent: 60.3 %G (ref 37–80)
HCT, POC: 38.3 % (ref 37.7–47.9)
HEMOGLOBIN: 12.9 g/dL (ref 12.2–16.2)
LYMPH, POC: 2.5 (ref 0.6–3.4)
MCH, POC: 30.4 pg (ref 27–31.2)
MCHC: 33.5 g/dL (ref 31.8–35.4)
MCV: 90.6 fL (ref 80–97)
MID (CBC): 0.5 (ref 0–0.9)
MPV: 8.5 fL (ref 0–99.8)
POC Granulocyte: 4.6 (ref 2–6.9)
POC LYMPH PERCENT: 33.5 %L (ref 10–50)
POC MID %: 6.2 % (ref 0–12)
Platelet Count, POC: 193 10*3/uL (ref 142–424)
RBC: 4.23 M/uL (ref 4.04–5.48)
RDW, POC: 15 %
WBC: 7.6 10*3/uL (ref 4.6–10.2)

## 2015-06-28 LAB — COMPREHENSIVE METABOLIC PANEL
ALBUMIN: 4.4 g/dL (ref 3.6–5.1)
ALK PHOS: 85 U/L (ref 33–115)
ALT: 11 U/L (ref 6–29)
AST: 12 U/L (ref 10–30)
BILIRUBIN TOTAL: 0.5 mg/dL (ref 0.2–1.2)
BUN: 9 mg/dL (ref 7–25)
CALCIUM: 9.4 mg/dL (ref 8.6–10.2)
CO2: 27 mmol/L (ref 20–31)
CREATININE: 0.73 mg/dL (ref 0.50–1.10)
Chloride: 103 mmol/L (ref 98–110)
GLUCOSE: 87 mg/dL (ref 65–99)
Potassium: 3.6 mmol/L (ref 3.5–5.3)
SODIUM: 138 mmol/L (ref 135–146)
Total Protein: 7.1 g/dL (ref 6.1–8.1)

## 2015-06-28 LAB — TSH: TSH: 0.866 u[IU]/mL (ref 0.350–4.500)

## 2015-06-28 LAB — POCT SEDIMENTATION RATE: POCT SED RATE: 16 mm/hr (ref 0–22)

## 2015-06-28 MED ORDER — HYDROCODONE-ACETAMINOPHEN 10-325 MG PO TABS: 1.0000 | ORAL_TABLET | Freq: Three times a day (TID) | ORAL | Status: DC | PRN

## 2015-06-28 NOTE — Progress Notes (Signed)
Patient ID: AIBHLINN KALMAR, female    DOB: 06-Sep-1981, 34 y.o.   MRN: [id number]  PCP: Rehan Holness, PA-C  Subjective:   Chief Complaint  Patient presents with  . sores on feet    both/ x 2weeks  . Leg Pain    x 2 weeks/ left leg    HPI Presents for evaluation of b/l foot sores and left leg pain.   Completed antibiotic therapy (doxycycline) for her feet last week, which are improved, however she reports continued pain in both feet. Has been doing epsom salt baths in the meantime with temporary relief. Denies any associated fevers, chills, drainage, or swelling.   Left leg continues to cause pain whenever the patient squats then stands. Pain starts in the left hip/groin area and radiates down the back of her left leg, stopping at the back of the left knee. She is able to ambulate, but cannot walk for long periods of time and either limps or uses a cane. She describes the pain as burning, but states it is distinct from the chronic neuropathic pain she experiences in her left shoulder and arm. She also reports continued cold intolerance in both feet with associated skin color changes (purple). Patient suspects it is related to poor circulation.  Patient has been out of work at [employer] for the last month d/t her left leg pain. Could not come to f/u appointment with Kai Railsback last week d/t transportation issues. Feels hopeless d/t being out of work for this long and continuing to have pain.    Review of Systems Constitutional: Negative for fever, chills and fatigue.  HENT: Negative.  Eyes: Negative.  Respiratory: Negative for cough, chest tightness, shortness of breath and wheezing.  Cardiovascular: Negative for chest pain, palpitations and leg swelling. Gastrointestinal: Negative.  Endocrine: complains of feeling cold in the feet, otherwise Negative.  Genitourinary: Negative.  Musculoskeletal: Positive for myalgias. Negative for back pain and joint swelling.  Skin: Positive for wound (both  ankles). Negative for color change and rash.  Neurological: Negative for dizziness, weakness, light-headedness and headaches.  Psychiatric/Behavioral: Negative for suicidal ideas.      Patient Active Problem List   Diagnosis Date Noted  . Drug psychosis with hallucinations ([place]) 01/20/2015  . MDD (major depressive disorder), recurrent, severe, with psychosis ([place]) 01/19/2015  . AKI (acute kidney injury) ([place]) 01/18/2015  . Hypokalemia 01/18/2015  . Migraine without aura   . IUD (intrauterine device) in place 06/02/2013  . Hypothyroid 12/09/2012  . Finger fracture, left   . Chronic left shoulder pain   . Mood disorder ([place]) 03/25/2012  . AR (allergic rhinitis) 03/25/2012  . Dysmenorrhea 03/25/2012  . PMDD (premenstrual dysphoric disorder) 03/25/2012  . Chronic insomnia 03/25/2012  . Migraine      Prior to Admission medications   Medication Sig Start Date End Date Taking? Authorizing Provider  ALPRAZolam (XANAX XR) 2 MG 24 hr tablet  06/17/15  Yes Historical Provider, MD  ALPRAZolam Duanne Moron) 1 MG tablet  06/22/15  Yes Historical Provider, MD  amphetamine-dextroamphetamine (ADDERALL) 20 MG tablet  05/05/15  Yes Historical Provider, MD  cyclobenzaprine (FLEXERIL) 10 MG tablet Take 1 tablet (10 mg total) by mouth at bedtime. 06/23/15  Yes Clell Trahan, PA-C  HYDROcodone-acetaminophen (NORCO) 10-325 MG per tablet Take 1 tablet by mouth every 8 (eight) hours as needed. 05/26/15  Yes Yarielys Beed, PA-C  levothyroxine (SYNTHROID, LEVOTHROID) 50 MCG tablet TAKE 1 TABLET (50 MCG TOTAL) BY MOUTH DAILY. 08/23/14  Yes Harrison Mons, PA-C  lidocaine (LIDODERM) 5 % Place 2 patches onto the skin daily. Remove and discard patch within 12 hours. PATIENT NEEDS OFFICE VISIT FOR ADDITIONAL REFILLS 03/03/15  Yes Gael Delude, PA-C  LYRICA 200 MG capsule TAKE ONE CAPSULE BY MOUTH 3 TIMES A DAY 04/15/15  Yes Mancel Bale, PA-C  Multiple Vitamins-Minerals (MULTIVITAMINS THER. W/MINERALS) TABS Take 1  tablet by mouth daily.    Yes Historical Provider, MD  QUEtiapine (SEROQUEL) 400 MG tablet Take 1 tablet (400 mg total) by mouth at bedtime. 01/21/15  Yes Shuvon B Rankin, NP  zolpidem (AMBIEN) 10 MG tablet  06/22/15  Yes Historical Provider, MD  gabapentin (NEURONTIN) 800 MG tablet  06/21/15   Historical Provider, MD     Allergies  Allergen Reactions  . Penicillins     unknown  . Progestins     Extreme moodiness  . Sinequan [Doxepin] Other (See Comments) and Hypertension    Hallucinations, delusions, severe agitation       Objective:  Physical Exam  Constitutional: She is oriented to person, place, and time. Vital signs are normal. She appears well-developed and well-nourished. She is active and cooperative. No distress.  BP 112/72 mmHg  Pulse 92  Temp(Src) 98.6 F (37 C) (Oral)  Resp 16  Ht 5\' 7"  (1.702 m)  Wt 169 lb (76.658 kg)  BMI 26.46 kg/m2  SpO2 98%  LMP 06/01/2015  HENT:  Head: Normocephalic and atraumatic.  Right Ear: Hearing normal.  Left Ear: Hearing normal.  Eyes: Conjunctivae are normal. No scleral icterus.  Neck: Normal range of motion. Neck supple. No thyromegaly present.  Cardiovascular: Normal rate, regular rhythm and normal heart sounds.   Pulses:      Radial pulses are 2+ on the right side, and 2+ on the left side.  Pulmonary/Chest: Effort normal and breath sounds normal.  Musculoskeletal:       Right hip: Normal.       Left hip: She exhibits normal range of motion, normal strength, no tenderness, no bony tenderness, no swelling, no crepitus, no deformity and no laceration.       Right knee: Normal.       Left knee: She exhibits normal range of motion, no swelling, no effusion, no ecchymosis, no deformity, no laceration, no erythema, normal alignment, no LCL laxity, normal patellar mobility, no bony tenderness, normal meniscus and no MCL laxity. Tenderness (popliteal space) found. No medial joint line, no lateral joint line, no MCL, no LCL and no  patellar tendon tenderness noted.       Right ankle: Normal. Achilles tendon normal.       Left ankle: Normal. Achilles tendon normal.       Feet:  Pain in the groin with flexion of the LEFT hip, and with external rotation of the LEFT hip. Pain at the greater trochanter with ADDuction of the LEFT hip.  Lymphadenopathy:       Head (right side): No tonsillar, no preauricular, no posterior auricular and no occipital adenopathy present.       Head (left side): No tonsillar, no preauricular, no posterior auricular and no occipital adenopathy present.    She has no cervical adenopathy.       Right: No supraclavicular adenopathy present.       Left: No supraclavicular adenopathy present.  Neurological: She is alert and oriented to person, place, and time. No sensory deficit.  Skin: Skin is warm, dry and intact. No rash noted. No cyanosis or erythema. Nails show no clubbing.  Psychiatric: She has a normal mood and affect. Her speech is normal and behavior is normal.       LEFT hip: [facility] reading (PRIMARY) by  Dr. Tamala Julian. Normal appearing hip.  Results for orders placed or performed in visit on 06/28/15  POCT CBC  Result Value Ref Range   WBC 7.6 4.6 - 10.2 K/uL   Lymph, poc 2.5 0.6 - 3.4   POC LYMPH PERCENT 33.5 10 - 50 %L   MID (cbc) 0.5 0 - 0.9   POC MID % 6.2 0 - 12 %M   POC Granulocyte 4.6 2 - 6.9   Granulocyte percent 60.3 37 - 80 %G   RBC 4.23 4.04 - 5.48 M/uL   Hemoglobin 12.9 12.2 - 16.2 g/dL   HCT, POC 38.3 37.7 - 47.9 %   MCV 90.6 80 - 97 fL   MCH, POC 30.4 27 - 31.2 pg   MCHC 33.5 31.8 - 35.4 g/dL   RDW, POC 15.0 %   Platelet Count, POC 193 142 - 424 K/uL   MPV 8.5 0 - 99.8 fL   POCT SEDIMENTATION RATE  Result Value Ref Range   POCT SED RATE 16 0 - 22 mm/hr         Assessment & Plan:   1. Hip pain, left Unclear etiology. Sounds musculoskeletal rather than vascular. Await remaining labs. MRI and refer to ortho. - DG HIP UNILAT W OR W/O PELVIS 2-3 VIEWS LEFT;  Future - MR Hip Left W Wo Contrast; Future - AMB referral to orthopedics - HYDROcodone-acetaminophen (NORCO) 10-325 MG tablet; Take 1 tablet by mouth every 8 (eight) hours as needed.  Dispense: 90 tablet; Refill: 0  2. Arthralgia of left lower leg See above. - POCT CBC - POCT SEDIMENTATION RATE - Comprehensive metabolic panel - TSH   Fara Chute, PA-C Physician Assistant-Certified [facility]

## 2015-06-28 NOTE — Progress Notes (Signed)
Subjective:    Patient ID: Barbara Rogers, female    DOB: Dec 22, 1980, 34 y.o.   MRN: [id number]  Chief Complaint  Patient presents with  . sores on feet    both/ x 2weeks  . Leg Pain    x 2 weeks/ left leg   HPI  Patient presents today for re-evaluation of b/l foot sores and left leg pain. Completed antibiotic therapy (doxycycline) for her feet last week, which are improved, however she reports continued pain in both feet. Has been doing epsom salt baths in the meantime with temporary relief. Denies any associated fevers, chills, drainage, or swelling. Left leg continues to cause pain whenever the patient squats then stands. Pain starts in the left hip/groin area and radiates down the back of her left leg, stopping at the back of the left knee. She is able to ambulate, but cannot walk for long periods of time and either limps or uses a cane. She describes the pain as burning, but states it is distinct from the chronic neuropathic pain she experiences in her left shoulder and arm. She also reports continued cold intolerance in both feet with associated skin color changes (purple). Patient suspects it is related to poor circulation.    Patient has been out of work at [employer] for the last month d/t her left leg pain. Could not come to f/u appointment with [name] last week d/t transportation issues. Feels hopeless d/t being out of work for this long and continuing to have pain.    Review of Systems  Constitutional: Negative for fever, chills and fatigue.  HENT: Negative.   Eyes: Negative.   Respiratory: Negative for cough, chest tightness, shortness of breath and wheezing.   Cardiovascular: Negative for chest pain, palpitations and leg swelling.  Gastrointestinal: Negative.   Endocrine: Negative.   Genitourinary: Negative.   Musculoskeletal: Positive for myalgias. Negative for back pain and joint swelling.  Skin: Positive for wound (nickel-sized ulcers on R lateral foot and L lateral ankle,  punched out appearance). Negative for color change and rash.  Neurological: Negative for dizziness, weakness, light-headedness and headaches.  Psychiatric/Behavioral: Negative for suicidal ideas.   Patient Active Problem List   Diagnosis Date Noted  . 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 MDD (major depressive disorder), recurrent, severe, with psychosis ([place]) 01/19/2015  . AKI (acute kidney injury) ([place]) 01/18/2015  . Hypokalemia 01/18/2015  . Migraine without aura   . IUD (intrauterine device) in place 06/02/2013  . Hypothyroid 12/09/2012  . Finger fracture, left   . Chronic left shoulder pain   . Mood disorder ([place]) 03/25/2012  . AR (allergic rhinitis) 03/25/2012  . Dysmenorrhea 03/25/2012  . PMDD (premenstrual dysphoric disorder) 03/25/2012  . Chronic insomnia 03/25/2012  . Migraine    Prior to Admission medications   Medication Sig Start Date End Date Taking? Authorizing Provider  ALPRAZolam (XANAX XR) 2 MG 24 hr tablet  06/17/15  Yes Historical Provider, MD  ALPRAZolam Duanne Moron) 1 MG tablet  06/22/15  Yes Historical Provider, MD  amphetamine-dextroamphetamine (ADDERALL) 20 MG tablet  05/05/15  Yes Historical Provider, MD  cyclobenzaprine (FLEXERIL) 10 MG tablet Take 1 tablet (10 mg total) by mouth at bedtime. 06/23/15  Yes [name] Jeffery, PA-C  HYDROcodone-acetaminophen (NORCO) 10-325 MG tablet Take 1 tablet by mouth every 8 (eight) hours as needed. 06/28/15  Yes [name] Jeffery, PA-C  levothyroxine (SYNTHROID, LEVOTHROID) 50 MCG tablet TAKE 1 TABLET (50 MCG TOTAL) BY MOUTH DAILY. 08/23/14  Yes Harrison Mons,  PA-C  lidocaine (LIDODERM) 5 % Place 2 patches onto the skin daily. Remove and discard patch within 12 hours. PATIENT NEEDS OFFICE VISIT FOR ADDITIONAL REFILLS 03/03/15  Yes [name] Jeffery, PA-C  LYRICA 200 MG capsule TAKE ONE CAPSULE BY MOUTH 3 TIMES A DAY 04/15/15  Yes Mancel Bale, PA-C  Multiple Vitamins-Minerals (MULTIVITAMINS THER. W/MINERALS) TABS Take 1  tablet by mouth daily.    Yes Historical Provider, MD  QUEtiapine (SEROQUEL) 400 MG tablet Take 1 tablet (400 mg total) by mouth at bedtime. 01/21/15  Yes Shuvon B Rankin, NP  zolpidem (AMBIEN) 10 MG tablet  06/22/15  Yes Historical Provider, MD  gabapentin (NEURONTIN) 800 MG tablet  06/21/15   Historical Provider, MD   Allergies  Allergen Reactions  . Penicillins     unknown  . Progestins     Extreme moodiness  . Sinequan [Doxepin] Other (See Comments) and Hypertension    Hallucinations, delusions, severe agitation   Patient's family and social history reviewed, no new changes.    Objective:   Physical Exam  Constitutional: She is oriented to person, place, and time. She appears well-developed and well-nourished. No distress.  BP 112/72 mmHg  Pulse 92  Temp(Src) 98.6 F (37 C) (Oral)  Resp 16  Ht 5\' 7"  (1.702 m)  Wt 169 lb (76.658 kg)  BMI 26.46 kg/m2  SpO2 98%  LMP 06/01/2015  HENT:  Head: Normocephalic and atraumatic.  Eyes: EOM are normal. No scleral icterus.  Musculoskeletal: She exhibits tenderness. She exhibits no edema.  Decreased left-sided ROM d/t leg pain  Neurological: She is alert and oriented to person, place, and time.  Skin: Skin is dry. No rash noted. No erythema. There is pallor.  Psychiatric: She has a normal mood and affect. Her behavior is normal. Judgment and thought content normal.   Results for orders placed or performed in visit on 06/28/15  POCT CBC  Result Value Ref Range   WBC 7.6 4.6 - 10.2 K/uL   Lymph, poc 2.5 0.6 - 3.4   POC LYMPH PERCENT 33.5 10 - 50 %L   MID (cbc) 0.5 0 - 0.9   POC MID % 6.2 0 - 12 %M   POC Granulocyte 4.6 2 - 6.9   Granulocyte percent 60.3 37 - 80 %G   RBC 4.23 4.04 - 5.48 M/uL   Hemoglobin 12.9 12.2 - 16.2 g/dL   HCT, POC 38.3 37.7 - 47.9 %   MCV 90.6 80 - 97 fL   MCH, POC 30.4 27 - 31.2 pg   MCHC 33.5 31.8 - 35.4 g/dL   RDW, POC 15.0 %   Platelet Count, POC 193 142 - 424 K/uL   MPV 8.5 0 - 99.8 fL  POCT  SEDIMENTATION RATE  Result Value Ref Range   POCT SED RATE 16 0 - 22 mm/hr   Left hip appears normal without bony deformity.    Assessment & Plan:  1. Hip pain, left - Order MR left hip w and w/o contrast to r/o trochanter bursitis or other soft tissue deformity. Need to r/o musculoskeletal etiology. - DG HIP UNILAT W OR W/O PELVIS 2-3 VIEWS LEFT; Future - MR Hip Left W Wo Contrast; Future - AMB referral to orthopedics - HYDROcodone-acetaminophen (NORCO) 10-325 MG tablet; Take 1 tablet by mouth every 8 (eight) hours as needed.  Dispense: 90 tablet; Refill: 0  2. Arthralgia of left lower leg - Need to r/o vasculitis or connective tissue disorder.  - POCT CBC - POCT  SEDIMENTATION RATE - Comprehensive metabolic panel - TSH

## 2015-07-07 ENCOUNTER — Encounter: Payer: Self-pay | Admitting: Physician Assistant

## 2015-07-08 ENCOUNTER — Encounter (HOSPITAL_COMMUNITY): Payer: Self-pay | Admitting: *Deleted

## 2015-07-08 ENCOUNTER — Emergency Department (HOSPITAL_COMMUNITY)
Admission: EM | Admit: 2015-07-08 | Discharge: 2015-07-08 | Disposition: A | Discharge status: Home / Self Care | Payer: BLUE CROSS/BLUE SHIELD | Attending: Emergency Medicine | Admitting: Emergency Medicine

## 2015-07-08 ENCOUNTER — Emergency Department (HOSPITAL_COMMUNITY): Payer: BLUE CROSS/BLUE SHIELD

## 2015-07-08 DIAGNOSIS — Y92410 Unspecified street and highway as the place of occurrence of the external cause: Secondary | ICD-10-CM | POA: Diagnosis not present

## 2015-07-08 DIAGNOSIS — J45909 Unspecified asthma, uncomplicated: Secondary | ICD-10-CM | POA: Diagnosis not present

## 2015-07-08 DIAGNOSIS — Z79899 Other long term (current) drug therapy: Secondary | ICD-10-CM | POA: Diagnosis not present

## 2015-07-08 DIAGNOSIS — F419 Anxiety disorder, unspecified: Secondary | ICD-10-CM | POA: Diagnosis not present

## 2015-07-08 DIAGNOSIS — Z720 Tobacco use: Secondary | ICD-10-CM | POA: Diagnosis not present

## 2015-07-08 DIAGNOSIS — Z87442 Personal history of urinary calculi: Secondary | ICD-10-CM | POA: Insufficient documentation

## 2015-07-08 DIAGNOSIS — F329 Major depressive disorder, single episode, unspecified: Secondary | ICD-10-CM | POA: Diagnosis not present

## 2015-07-08 DIAGNOSIS — S161XXA Strain of muscle, fascia and tendon at neck level, initial encounter: Secondary | ICD-10-CM | POA: Diagnosis not present

## 2015-07-08 DIAGNOSIS — G43009 Migraine without aura, not intractable, without status migrainosus: Secondary | ICD-10-CM | POA: Diagnosis not present

## 2015-07-08 DIAGNOSIS — Z8781 Personal history of (healed) traumatic fracture: Secondary | ICD-10-CM | POA: Diagnosis not present

## 2015-07-08 DIAGNOSIS — Y998 Other external cause status: Secondary | ICD-10-CM | POA: Diagnosis not present

## 2015-07-08 DIAGNOSIS — Y9389 Activity, other specified: Secondary | ICD-10-CM | POA: Diagnosis not present

## 2015-07-08 DIAGNOSIS — Z8742 Personal history of other diseases of the female genital tract: Secondary | ICD-10-CM | POA: Diagnosis not present

## 2015-07-08 DIAGNOSIS — Z880 Allergy status to penicillin: Secondary | ICD-10-CM | POA: Diagnosis not present

## 2015-07-08 DIAGNOSIS — G4700 Insomnia, unspecified: Secondary | ICD-10-CM | POA: Insufficient documentation

## 2015-07-08 DIAGNOSIS — G8929 Other chronic pain: Secondary | ICD-10-CM | POA: Insufficient documentation

## 2015-07-08 DIAGNOSIS — S199XXA Unspecified injury of neck, initial encounter: Secondary | ICD-10-CM | POA: Diagnosis present

## 2015-07-08 MED ORDER — IBUPROFEN 800 MG PO TABS: 800.0000 mg | ORAL_TABLET | Freq: Three times a day (TID) | ORAL | Status: DC

## 2015-07-08 MED ORDER — METHOCARBAMOL 500 MG PO TABS: 500.0000 mg | ORAL_TABLET | Freq: Two times a day (BID) | ORAL | Status: DC

## 2015-07-08 NOTE — Discharge Instructions (Signed)
Acute Torticollis °Torticollis is a condition in which the muscles of the neck tighten (contract) abnormally, causing the neck to twist and the head to move into an unnatural position. Torticollis that develops suddenly is called acute torticollis. If torticollis becomes chronic and is left untreated, the face and neck can become deformed. °CAUSES °This condition may be caused by: °· Sleeping in an awkward position (common). °· Extending or twisting the neck muscles beyond their normal position. °· Infection. °In some cases, the cause may not be known. °SYMPTOMS °Symptoms of this condition include: °· An unnatural position of the head. °· Neck pain. °· A limited ability to move the neck. °· Twisting of the neck to one side. °DIAGNOSIS °This condition is diagnosed with a physical exam. You may also have imaging tests, such as an X-ray, CT scan, or MRI. °TREATMENT °Treatment for this condition involves trying to relax the neck muscles. It may include: °· Medicines or shots. °· Physical therapy. °· Surgery. This may be done in severe cases. °HOME CARE INSTRUCTIONS °· Take medicines only as directed by your health care provider. °· Do stretching exercises and massage your neck as directed by your health care provider. °· Keep all follow-up visits as directed by your health care provider. This is important. °SEEK MEDICAL CARE IF: °· You develop a fever. °SEEK IMMEDIATE MEDICAL CARE IF: °· You develop difficulty breathing. °· You develop noisy breathing (stridor). °· You start drooling. °· You have trouble swallowing or have pain with swallowing. °· You develop numbness or weakness in your hands or feet. °· You have changes in your speech, understanding, or vision. °· Your pain gets worse. °  °This information is not intended to replace advice given to you by your health care provider. Make sure you discuss any questions you have with your health care provider. °  °Document Released: 08/23/2000 Document Revised:  01/10/2015 Document Reviewed: 08/22/2014 °Elsevier Interactive Patient Education ©2016 [employer] ° °
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
## 2015-07-08 NOTE — ED Notes (Addendum)
Pt refuses to wear her C-Collar any longer. States it makes her neck hurt worse.

## 2015-07-08 NOTE — ED Notes (Signed)
Pt reminded not to eat before being seen by EDP. Pt continues to eat.

## 2015-07-08 NOTE — ED Notes (Signed)
C-collar applied by NT.

## 2015-07-10 MED ORDER — NICOTINE 21-14-7 MG/24HR TD KIT: PACK | TRANSDERMAL | Status: DC

## 2015-07-16 ENCOUNTER — Ambulatory Visit (INDEPENDENT_AMBULATORY_CARE_PROVIDER_SITE_OTHER): Payer: BLUE CROSS/BLUE SHIELD | Admitting: Physician Assistant

## 2015-07-16 VITALS — BP 110/80 | HR 109 | Temp 97.5°F | Resp 18 | Ht 67.0 in | Wt 170.2 lb

## 2015-07-16 DIAGNOSIS — M25562 Pain in left knee: Secondary | ICD-10-CM

## 2015-07-16 DIAGNOSIS — R350 Frequency of micturition: Secondary | ICD-10-CM | POA: Diagnosis not present

## 2015-07-16 DIAGNOSIS — M542 Cervicalgia: Secondary | ICD-10-CM

## 2015-07-16 LAB — POC MICROSCOPIC URINALYSIS (UMFC): Mucus: ABSENT

## 2015-07-16 LAB — POCT URINALYSIS DIP (MANUAL ENTRY)
Bilirubin, UA: NEGATIVE
Glucose, UA: NEGATIVE
Ketones, POC UA: NEGATIVE
NITRITE UA: NEGATIVE
PH UA: 7
Protein Ur, POC: NEGATIVE
Spec Grav, UA: 1.015
UROBILINOGEN UA: 0.2

## 2015-07-16 MED ORDER — OXYCODONE-ACETAMINOPHEN 7.5-325 MG PO TABS: 1.0000 | ORAL_TABLET | Freq: Three times a day (TID) | ORAL | Status: DC | PRN

## 2015-07-16 MED ORDER — CYCLOBENZAPRINE HCL 10 MG PO TABS
5.0000 mg | ORAL_TABLET | Freq: Every day | ORAL | Status: DC
Start: 2015-07-16 — End: 2015-07-20

## 2015-07-16 MED ORDER — NICOTINE 21-14-7 MG/24HR TD KIT: PACK | TRANSDERMAL | Status: DC

## 2015-07-16 MED ORDER — PREDNISONE 20 MG PO TABS: ORAL_TABLET | ORAL | Status: DC
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
## 2015-07-16 NOTE — Patient Instructions (Signed)
Start the gabapentin from Dr. Toy Care. Start by taking 1/2 tablet at bedtime. If you tolerate that, you can increase it to the whole tablet at bedtime. You can then add 1/2 tablet each morning, and then increase to the whole tablet in the mornings, too.

## 2015-07-18 LAB — URINE CULTURE

## 2015-07-18 MED ORDER — SULFAMETHOXAZOLE-TRIMETHOPRIM 800-160 MG PO TABS: 1.0000 | ORAL_TABLET | Freq: Two times a day (BID) | ORAL | Status: DC

## 2015-07-18 NOTE — Addendum Note (Signed)
Addended by: Fara Chute on: 07/18/2015 02:16 PM   Modules accepted: Orders

## 2015-07-19 ENCOUNTER — Encounter: Payer: Self-pay | Admitting: Physician Assistant

## 2015-07-19 ENCOUNTER — Telehealth: Payer: Self-pay

## 2015-07-19 ENCOUNTER — Other Ambulatory Visit: Payer: Self-pay | Admitting: Physician Assistant

## 2015-07-19 DIAGNOSIS — M542 Cervicalgia: Secondary | ICD-10-CM

## 2015-07-19 DIAGNOSIS — M25562 Pain in left knee: Secondary | ICD-10-CM
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
## 2015-07-20 MED ORDER — PREDNISONE 20 MG PO TABS: ORAL_TABLET | ORAL | Status: DC

## 2015-07-20 MED ORDER — CYCLOBENZAPRINE HCL 10 MG PO TABS: 5.0000 mg | ORAL_TABLET | Freq: Every day | ORAL | Status: DC

## 2015-07-20 NOTE — Telephone Encounter (Signed)
I authorized the prednisone and flexeril. I am not comfortable refilling the oxycodone.  Patient notified via My Chart.

## 2015-07-20 NOTE — Addendum Note (Signed)
Addended by: Fara Chute on: 07/20/2015 01:35 PM   Modules accepted: Orders

## 2015-07-21 ENCOUNTER — Encounter: Payer: Self-pay | Admitting: Physician Assistant

## 2015-07-21 ENCOUNTER — Other Ambulatory Visit: Payer: Self-pay | Admitting: Physician Assistant

## 2015-07-24 ENCOUNTER — Telehealth: Payer: Self-pay

## 2015-07-24 DIAGNOSIS — M25562 Pain in left knee: Secondary | ICD-10-CM

## 2015-07-24 DIAGNOSIS — M542 Cervicalgia: Secondary | ICD-10-CM

## 2015-07-24 NOTE — Telephone Encounter (Signed)
Pt called stating her pharmacy did not receive Prednisone, Flexeril, and nicotine patch. Can I re-send. She would like a note for work to go back to work on Tuesday.

## 2015-07-25 MED ORDER — CYCLOBENZAPRINE HCL 10 MG PO TABS: 5.0000 mg | ORAL_TABLET | Freq: Every day | ORAL | Status: DC

## 2015-07-25 MED ORDER — PREDNISONE 20 MG PO TABS: ORAL_TABLET | ORAL | Status: DC

## 2015-07-25 MED ORDER — NICOTINE 21-14-7 MG/24HR TD KIT: PACK | TRANSDERMAL | Status: DC

## 2015-07-25 NOTE — Telephone Encounter (Signed)
Called pt, mailbox full

## 2015-07-25 NOTE — Telephone Encounter (Signed)
Thank you for resending prescriptions. [place] for work note to return to work TODAY, Tuesday 07/25/2015.

## 2015-07-26 NOTE — Telephone Encounter (Signed)
[name], I didn't know if taking the pred might have contributed to yeast inf, so am sending this req to you for review instead of denying.

## 2015-07-26 NOTE — Telephone Encounter (Signed)
Pt is speaking to [place] though mychart.

## 2015-07-29 ENCOUNTER — Ambulatory Visit (INDEPENDENT_AMBULATORY_CARE_PROVIDER_SITE_OTHER): Payer: BLUE CROSS/BLUE SHIELD | Admitting: Physician Assistant

## 2015-07-29 ENCOUNTER — Telehealth: Payer: Self-pay

## 2015-07-29 ENCOUNTER — Telehealth: Payer: Self-pay | Admitting: Physician Assistant

## 2015-07-29 VITALS — BP 118/72 | HR 88 | Temp 98.3°F | Resp 18 | Ht 67.0 in | Wt 174.4 lb

## 2015-07-29 DIAGNOSIS — F3281 Premenstrual dysphoric disorder: Secondary | ICD-10-CM | POA: Diagnosis not present

## 2015-07-29 DIAGNOSIS — G4700 Insomnia, unspecified: Secondary | ICD-10-CM | POA: Diagnosis not present

## 2015-07-29 DIAGNOSIS — F333 Major depressive disorder, recurrent, severe with psychotic symptoms: Secondary | ICD-10-CM

## 2015-07-29 DIAGNOSIS — M25562 Pain in left knee: Secondary | ICD-10-CM

## 2015-07-29 DIAGNOSIS — R05 Cough: Secondary | ICD-10-CM

## 2015-07-29 DIAGNOSIS — F5104 Psychophysiologic insomnia: Secondary | ICD-10-CM

## 2015-07-29 DIAGNOSIS — M79605 Pain in left leg: Secondary | ICD-10-CM

## 2015-07-29 DIAGNOSIS — F17200 Nicotine dependence, unspecified, uncomplicated: Secondary | ICD-10-CM

## 2015-07-29 DIAGNOSIS — M542 Cervicalgia: Secondary | ICD-10-CM | POA: Diagnosis not present

## 2015-07-29 DIAGNOSIS — R202 Paresthesia of skin: Secondary | ICD-10-CM | POA: Diagnosis not present

## 2015-07-29 DIAGNOSIS — R0981 Nasal congestion: Secondary | ICD-10-CM

## 2015-07-29 DIAGNOSIS — L989 Disorder of the skin and subcutaneous tissue, unspecified: Secondary | ICD-10-CM

## 2015-07-29 DIAGNOSIS — Z720 Tobacco use: Secondary | ICD-10-CM | POA: Diagnosis not present

## 2015-07-29 DIAGNOSIS — R059 Cough, unspecified: Secondary | ICD-10-CM

## 2015-07-29 MED ORDER — FLUTICASONE PROPIONATE 50 MCG/ACT NA SUSP: 2.0000 | Freq: Every day | NASAL | Status: DC

## 2015-07-29 MED ORDER — BENZONATATE 100 MG PO CAPS: 100.0000 mg | ORAL_CAPSULE | Freq: Three times a day (TID) | ORAL | Status: DC | PRN

## 2015-07-29 MED ORDER — NICOTINE 21-14-7 MG/24HR TD KIT: PACK | TRANSDERMAL | Status: DC

## 2015-07-29 MED ORDER — CYCLOBENZAPRINE HCL 10 MG PO TABS: 5.0000 mg | ORAL_TABLET | Freq: Every day | ORAL | Status: DC
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
## 2015-07-29 NOTE — Telephone Encounter (Signed)
Pharmacy called. Spoke to [employer] and gave verbal order to change rx.

## 2015-07-29 NOTE — Telephone Encounter (Signed)
Pt was just seen by chelle and is at the pharmacy now , she wants to know if she can get the flexeril rx changed from once a day to 3 times a day so insurance will cover it   Best number [phone]

## 2015-08-04 ENCOUNTER — Encounter: Payer: Self-pay | Admitting: Physician Assistant

## 2015-08-04 DIAGNOSIS — M542 Cervicalgia: Secondary | ICD-10-CM

## 2015-08-04 DIAGNOSIS — M25562 Pain in left knee: Secondary | ICD-10-CM

## 2015-08-05 MED ORDER — OXYCODONE-ACETAMINOPHEN 7.5-325 MG PO TABS: 1.0000 | ORAL_TABLET | Freq: Three times a day (TID) | ORAL | Status: DC | PRN

## 2015-08-05 NOTE — Telephone Encounter (Signed)
Patient notified via My Chart.  Meds ordered this encounter  Medications  . oxyCODONE-acetaminophen (PERCOCET) 7.5-325 MG tablet    Sig: Take 1 tablet by mouth every 8 (eight) hours as needed for severe pain.    Dispense:  90 tablet    Refill:  0    May fill on/after 08/14/2015    Order Specific Question:  Supervising Provider    Answer:  Tami Lin P [id number]

## 2015-08-07 ENCOUNTER — Telehealth: Payer: Self-pay

## 2015-08-07 NOTE — Telephone Encounter (Signed)
Patient is requesting her letter from [facility] be faxed to her employer at [place] Number [phone]  Emp [id number]

## 2015-08-08 ENCOUNTER — Encounter: Payer: Self-pay | Admitting: Physician Assistant

## 2015-08-08 NOTE — Telephone Encounter (Signed)
See mychart message. She wants it changed until 11/30

## 2015-08-08 NOTE — Telephone Encounter (Signed)
Ok for letter

## 2015-08-09 ENCOUNTER — Telehealth: Payer: Self-pay | Admitting: Physician Assistant

## 2015-08-09 NOTE — Telephone Encounter (Signed)
Pt called to check the status of My chart req. To have out of work not changed to list out of work from 08-04-15 through 08-09-15 to return 11-31-16. She also asked that this letter be sent to her employer; she didn't have information for the employer's correspondence but states we have the needed information.

## 2015-08-09 NOTE — Telephone Encounter (Signed)
Letter faxed.

## 2015-08-09 NOTE — Telephone Encounter (Signed)
Faxed note to her emplyer.

## 2015-08-14 ENCOUNTER — Encounter: Payer: Self-pay | Admitting: Physician Assistant

## 2015-08-14 NOTE — Telephone Encounter (Signed)
2 notes, per her request.  She came in to pick them up.

## 2015-08-21 ENCOUNTER — Encounter: Payer: Self-pay | Admitting: Physician Assistant

## 2015-08-22 NOTE — Telephone Encounter (Signed)
Work note printed at 104. Will bring to 102 after clinic.

## 2015-08-23 ENCOUNTER — Other Ambulatory Visit: Payer: Self-pay | Admitting: Physician Assistant

## 2015-08-29 ENCOUNTER — Telehealth: Payer: Self-pay | Admitting: *Deleted
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
## 2015-09-06 ENCOUNTER — Encounter: Payer: Self-pay | Admitting: Physician Assistant

## 2015-09-06 DIAGNOSIS — M25552 Pain in left hip: Secondary | ICD-10-CM

## 2015-09-07 MED ORDER — HYDROCODONE-ACETAMINOPHEN 10-325 MG PO TABS: 1.0000 | ORAL_TABLET | Freq: Three times a day (TID) | ORAL | Status: DC | PRN

## 2015-09-07 MED ORDER — LIDOCAINE 5 % EX PTCH: 2.0000 | MEDICATED_PATCH | CUTANEOUS | Status: DC

## 2015-09-07 MED ORDER — LEVOTHYROXINE SODIUM 50 MCG PO TABS: ORAL_TABLET | ORAL | Status: DC

## 2015-09-07 NOTE — Telephone Encounter (Signed)
Patient notified via My Chart.  Meds ordered this encounter  Medications  . HYDROcodone-acetaminophen (NORCO) 10-325 MG tablet    Sig: Take 1 tablet by mouth every 8 (eight) hours as needed.    Dispense:  90 tablet    Refill:  0    Order Specific Question:  Supervising Provider    Answer:  DOOLITTLE, ROBERT P [id number]  . lidocaine (LIDODERM) 5 %    Sig: Place 2 patches onto the skin daily. Remove and discard patch within 12 hours.    Dispense:  180 patch    Refill:  0    Order Specific Question:  Supervising Provider    Answer:  DOOLITTLE, ROBERT P [id number]  . levothyroxine (SYNTHROID, LEVOTHROID) 50 MCG tablet    Sig: Take 1 tablet (50 mcg total) by mouth daily    Dispense:  90 tablet    Refill:  0    Order Specific Question:  Supervising Provider    Answer:  DOOLITTLE, ROBERT P [id number]

## 2015-09-08 NOTE — Telephone Encounter (Signed)
Norco Rx in drawer.

## 2015-10-06 ENCOUNTER — Encounter: Payer: Self-pay | Admitting: Physician Assistant

## 2015-10-06 DIAGNOSIS — M25552 Pain in left hip: Secondary | ICD-10-CM

## 2015-10-06 MED ORDER — HYDROCODONE-ACETAMINOPHEN 10-325 MG PO TABS: 1.0000 | ORAL_TABLET | Freq: Three times a day (TID) | ORAL | Status: DC | PRN

## 2015-10-08 ENCOUNTER — Telehealth: Payer: Self-pay | Admitting: *Deleted

## 2015-10-08 NOTE — Telephone Encounter (Signed)
Patient aware ready for pickup 

## 2015-10-13 ENCOUNTER — Telehealth: Payer: Self-pay

## 2015-10-13 NOTE — Telephone Encounter (Signed)
Pending payment for $22.25 for 32 pages of records. Invoice faxed on 10/13/2015.

## 2015-10-23 ENCOUNTER — Encounter: Payer: Self-pay | Admitting: Physician Assistant

## 2015-10-24 MED ORDER — ALBUTEROL SULFATE HFA 108 (90 BASE) MCG/ACT IN AERS: 2.0000 | INHALATION_SPRAY | RESPIRATORY_TRACT | Status: DC | PRN

## 2015-10-24 MED ORDER — MOMETASONE FUROATE 50 MCG/ACT NA SUSP: 2.0000 | Freq: Every day | NASAL | Status: DC

## 2015-10-24 MED ORDER — PREGABALIN 200 MG PO CAPS: 200.0000 mg | ORAL_CAPSULE | Freq: Three times a day (TID) | ORAL | Status: DC

## 2015-10-24 NOTE — Telephone Encounter (Signed)
Patient notified via My Chart.  Lyrica printed at 104. Will bring to 102 after clinic. Please fax/call to her pharmacy.  Meds ordered this encounter  Medications  . pregabalin (LYRICA) 200 MG capsule    Sig: Take 1 capsule (200 mg total) by mouth 3 (three) times daily.    Dispense:  270 capsule    Refill:  1    Order Specific Question:  Supervising Provider    Answer:  DOOLITTLE, ROBERT P [id number]  . mometasone (NASONEX) 50 MCG/ACT nasal spray    Sig: Place 2 sprays into the nose daily.    Dispense:  51 g    Refill:  3    Order Specific Question:  Supervising Provider    Answer:  DOOLITTLE, ROBERT P [id number]  . albuterol (PROVENTIL HFA;VENTOLIN HFA) 108 (90 Base) MCG/ACT inhaler    Sig: Inhale 2 puffs into the lungs every 4 (four) hours as needed for wheezing or shortness of breath (cough, shortness of breath or wheezing.).    Dispense:  1 Inhaler    Refill:  1    Order Specific Question:  Supervising Provider    Answer:  DOOLITTLE, ROBERT P [id number]

## 2015-10-25 NOTE — Telephone Encounter (Signed)
Faxed

## 2015-11-03 ENCOUNTER — Other Ambulatory Visit: Payer: Self-pay

## 2015-11-03 ENCOUNTER — Encounter: Payer: Self-pay | Admitting: Physician Assistant

## 2015-11-03 DIAGNOSIS — M79605 Pain in left leg: Secondary | ICD-10-CM

## 2015-11-03 DIAGNOSIS — Z0271 Encounter for disability determination: Secondary | ICD-10-CM

## 2015-11-03 DIAGNOSIS — M542 Cervicalgia: Secondary | ICD-10-CM

## 2015-11-03 MED ORDER — CYCLOBENZAPRINE HCL 10 MG PO TABS: 5.0000 mg | ORAL_TABLET | Freq: Three times a day (TID) | ORAL | Status: DC | PRN

## 2015-11-03 NOTE — Telephone Encounter (Signed)
Payment received and records faxed on 11/03/15

## 2015-11-06 ENCOUNTER — Other Ambulatory Visit: Payer: Self-pay | Admitting: Physician Assistant

## 2015-11-06 ENCOUNTER — Encounter: Payer: Self-pay | Admitting: Physician Assistant

## 2015-11-06 DIAGNOSIS — M25552 Pain in left hip: Secondary | ICD-10-CM

## 2015-11-07 MED ORDER — HYDROCODONE-ACETAMINOPHEN 10-325 MG PO TABS: 1.0000 | ORAL_TABLET | Freq: Three times a day (TID) | ORAL | Status: DC | PRN

## 2015-11-07 NOTE — Telephone Encounter (Signed)
Rx printed at 104. Will bring to 102 after clinic.  Meds ordered this encounter  Medications  . HYDROcodone-acetaminophen (NORCO) 10-325 MG tablet    Sig: Take 1 tablet by mouth every 8 (eight) hours as needed.    Dispense:  90 tablet    Refill:  0    Order Specific Question:  Supervising Provider    Answer:  DOOLITTLE, ROBERT P [id number]

## 2015-11-08 NOTE — Telephone Encounter (Signed)
Already done

## 2015-11-13 ENCOUNTER — Encounter: Payer: Self-pay | Admitting: Physician Assistant

## 2015-11-14 NOTE — Telephone Encounter (Signed)
Work note printed at 104. Will bring to 102 after clinic. Patient will send fax number when she has it.

## 2015-11-16 NOTE — Telephone Encounter (Signed)
Letter re-done, printed and signed. Placed in nurses' box. Please fax to number requested.

## 2015-11-17 NOTE — Telephone Encounter (Signed)
Please fax revised work note.

## 2015-11-19 ENCOUNTER — Encounter: Payer: Self-pay | Admitting: *Deleted

## 2015-11-19 ENCOUNTER — Telehealth: Payer: Self-pay | Admitting: *Deleted

## 2015-11-19 NOTE — Telephone Encounter (Signed)
Pt notified about letter was ready but she wanted [place] to fax it.  Note was faxed to [employer] [phone]

## 2015-11-20 ENCOUNTER — Encounter: Payer: Self-pay | Admitting: Physician Assistant

## 2015-11-21 ENCOUNTER — Encounter: Payer: Self-pay | Admitting: *Deleted

## 2015-11-23 ENCOUNTER — Telehealth: Payer: Self-pay

## 2015-11-23 NOTE — Telephone Encounter (Signed)
Unfortunately, [name] is not here at this time, that can confirm this almost 2 weeks of pain that is hindering her quality of life.  Please alert her that she must come in for evaluation, and letter.

## 2015-11-23 NOTE — Telephone Encounter (Signed)
Please write the note for what the patient wants I will sign for it.

## 2015-11-23 NOTE — Telephone Encounter (Signed)
Patient stated Barbara Rogers wrote her a note out of work from the 6-12th. Patient stated she went back to work and still having bad cramps. Patient stated she had to leave work. She doesn't like to take her medication while at work. Patient need a work note stating she can return to work on Monday March 20th. Note need to say return to full duty. [phone].

## 2015-11-27 ENCOUNTER — Telehealth: Payer: Self-pay

## 2015-11-27 DIAGNOSIS — M25552 Pain in left hip: Secondary | ICD-10-CM

## 2015-11-27 MED ORDER — HYDROCODONE-ACETAMINOPHEN 10-325 MG PO TABS: 1.0000 | ORAL_TABLET | Freq: Three times a day (TID) | ORAL | Status: DC | PRN
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
## 2015-11-27 NOTE — Telephone Encounter (Signed)
[name] is not here. Can someone help?

## 2015-11-27 NOTE — Telephone Encounter (Signed)
I have filled. She can pick up rx early but she will not be able to fill until 12/04/15.  Pt needs to return to see [name] before can get further refills.

## 2015-11-28 ENCOUNTER — Telehealth: Payer: Self-pay

## 2015-11-28 NOTE — Telephone Encounter (Signed)
Advise on note?

## 2015-11-28 NOTE — Telephone Encounter (Signed)
I wrote a note for work for last week because her cramps were really bad - is she still on her menses?

## 2015-11-28 NOTE — Telephone Encounter (Signed)
Pt wants a note to be out of work from 11-27-2015-12-04-2015 due to her illness of PMDD. Pt also states that she is clinically depressed. Please call patient when note is ready for pick up.

## 2015-11-29 ENCOUNTER — Other Ambulatory Visit: Payer: Self-pay | Admitting: Physician Assistant

## 2015-11-29 NOTE — Telephone Encounter (Signed)
I have written note, however this is the last note she will receive from [place] until she is seen in the office. [name] will be back after 3/28. We cannot keep writing notes for patient without seeing her.

## 2015-11-29 NOTE — Telephone Encounter (Signed)
See my chart note.

## 2015-11-29 NOTE — Telephone Encounter (Signed)
Ok advised pt through [employer].

## 2015-11-30 NOTE — Telephone Encounter (Signed)
Ok to give her a note for 3/21 and then ok to go back to work. No we do not do these injections - she will need to call them

## 2015-11-30 NOTE — Telephone Encounter (Signed)
I am very sorry but there are so many message going on now I have no idea which is 1st and last. I just responded to a message that she went back to work yesterday.  So I cam closing this message chain.

## 2015-12-06 ENCOUNTER — Emergency Department (HOSPITAL_COMMUNITY)
Admission: EM | Admit: 2015-12-06 | Discharge: 2015-12-08 | Payer: BLUE CROSS/BLUE SHIELD | Attending: Emergency Medicine | Admitting: Emergency Medicine

## 2015-12-06 ENCOUNTER — Encounter (HOSPITAL_COMMUNITY): Payer: Self-pay | Admitting: Emergency Medicine

## 2015-12-06 DIAGNOSIS — F329 Major depressive disorder, single episode, unspecified: Secondary | ICD-10-CM | POA: Insufficient documentation

## 2015-12-06 DIAGNOSIS — F17210 Nicotine dependence, cigarettes, uncomplicated: Secondary | ICD-10-CM | POA: Insufficient documentation

## 2015-12-06 DIAGNOSIS — J45909 Unspecified asthma, uncomplicated: Secondary | ICD-10-CM | POA: Diagnosis not present

## 2015-12-06 DIAGNOSIS — Z79899 Other long term (current) drug therapy: Secondary | ICD-10-CM | POA: Diagnosis not present

## 2015-12-06 DIAGNOSIS — F28 Other psychotic disorder not due to a substance or known physiological condition: Secondary | ICD-10-CM | POA: Diagnosis not present

## 2015-12-06 DIAGNOSIS — R440 Auditory hallucinations: Secondary | ICD-10-CM | POA: Diagnosis present

## 2015-12-06 LAB — CBC WITH DIFFERENTIAL/PLATELET
BASOS ABS: 0 10*3/uL (ref 0.0–0.1)
BASOS PCT: 0 %
Eosinophils Absolute: 0 10*3/uL (ref 0.0–0.7)
Eosinophils Relative: 0 %
HEMATOCRIT: 41.7 % (ref 36.0–46.0)
HEMOGLOBIN: 14.6 g/dL (ref 12.0–15.0)
Lymphocytes Relative: 27 %
Lymphs Abs: 2.5 10*3/uL (ref 0.7–4.0)
MCH: 31.3 pg (ref 26.0–34.0)
MCHC: 35 g/dL (ref 30.0–36.0)
MCV: 89.5 fL (ref 78.0–100.0)
MONOS PCT: 7 %
Monocytes Absolute: 0.6 10*3/uL (ref 0.1–1.0)
NEUTROS ABS: 5.9 10*3/uL (ref 1.7–7.7)
NEUTROS PCT: 66 %
Platelets: 274 10*3/uL (ref 150–400)
RBC: 4.66 MIL/uL (ref 3.87–5.11)
RDW: 13.2 % (ref 11.5–15.5)
WBC: 9 10*3/uL (ref 4.0–10.5)

## 2015-12-06 LAB — BASIC METABOLIC PANEL
ANION GAP: 12 (ref 5–15)
BUN: 15 mg/dL (ref 6–20)
CALCIUM: 9.3 mg/dL (ref 8.9–10.3)
CHLORIDE: 102 mmol/L (ref 101–111)
CO2: 24 mmol/L (ref 22–32)
CREATININE: 0.86 mg/dL (ref 0.44–1.00)
Glucose, Bld: 131 mg/dL — ABNORMAL HIGH (ref 65–99)
Potassium: 3.2 mmol/L — ABNORMAL LOW (ref 3.5–5.1)
Sodium: 138 mmol/L (ref 135–145)

## 2015-12-06 LAB — RAPID URINE DRUG SCREEN, HOSP PERFORMED
AMPHETAMINES: NOT DETECTED
Barbiturates: NOT DETECTED
Benzodiazepines: NOT DETECTED
Cocaine: NOT DETECTED
OPIATES: NOT DETECTED
Tetrahydrocannabinol: POSITIVE — AB

## 2015-12-06 LAB — ETHANOL

## 2015-12-06 MED ORDER — ZIPRASIDONE MESYLATE 20 MG IM SOLR
10.0000 mg | Freq: Once | INTRAMUSCULAR | Status: AC
  Administered 2015-12-06: 10 mg via INTRAMUSCULAR
  Filled 2015-12-06: qty 20

## 2015-12-06 MED ORDER — ZIPRASIDONE MESYLATE 20 MG IM SOLR
20.0000 mg | Freq: Once | INTRAMUSCULAR | Status: AC
  Administered 2015-12-06: 20 mg via INTRAMUSCULAR
  Filled 2015-12-06: qty 20

## 2015-12-06 MED ORDER — LORAZEPAM 2 MG/ML IJ SOLN
2.0000 mg | Freq: Once | INTRAMUSCULAR | Status: AC
  Administered 2015-12-06: 2 mg via INTRAMUSCULAR

## 2015-12-06 MED ORDER — LORAZEPAM 2 MG/ML IJ SOLN
INTRAMUSCULAR | Status: AC
  Administered 2015-12-06: 2 mg via INTRAMUSCULAR
  Filled 2015-12-06: qty 1

## 2015-12-06 MED ORDER — STERILE WATER FOR INJECTION IJ SOLN
INTRAMUSCULAR | Status: AC
  Administered 2015-12-06: 1.2 mL
  Filled 2015-12-06: qty 10

## 2015-12-06 MED ORDER — LORAZEPAM 2 MG/ML IJ SOLN
2.0000 mg | Freq: Once | INTRAMUSCULAR | Status: AC
  Administered 2015-12-06: 2 mg via INTRAMUSCULAR
  Filled 2015-12-06: qty 1

## 2015-12-06 MED ORDER — DIPHENHYDRAMINE HCL 25 MG PO CAPS
50.0000 mg | ORAL_CAPSULE | Freq: Once | ORAL | Status: AC
  Administered 2015-12-06: 50 mg via ORAL
  Filled 2015-12-06: qty 2

## 2015-12-06 MED ORDER — HALOPERIDOL LACTATE 5 MG/ML IJ SOLN
5.0000 mg | Freq: Once | INTRAMUSCULAR | Status: AC
  Administered 2015-12-06: 5 mg via INTRAMUSCULAR
  Filled 2015-12-06: qty 1
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
## 2015-12-06 NOTE — Progress Notes (Addendum)
FNP Catalina Pizza reccommended psychiatric inpatient treatment on 3/29.  Patient has been referred to: [facility] - per [place], adult beds only. [facility] - per [place], 1 adult female and 1 adult female. [facility] - accepting for the waitlist. [facility] - left voicemail. [facility] - left voicemail. [facility] - per Aaron Edelman, "We have been accepting referrals." [facility] - per Aurea Graff, send referral.  At capacity: [facility] - per [place] - per [facility] - per [facility] - per [facility]  CSW will continue to seek placement.  Verlon Setting, [place] Disposition staff 12/06/2015 6:33 PM

## 2015-12-06 NOTE — Progress Notes (Signed)
Ayisha at [facility] asked to be transferred to speak with pt's RN. [facility] phone number provided. Ayisha informed Probation officer that she will be Merchant navy officer with further updates on placement.  Verlon Setting, [place] Disposition staff 12/06/2015 8:45 PM
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
## 2015-12-06 NOTE — ED Notes (Addendum)
Pt released from restraints, ambulated to bathroom with nurse and officer at side, pt refused bathroom privileges, alert but disoriented, pt attempted to walk in wrong room, redirected to her room by this nurse. Restraints were reapplied. Skin assessed, wrists are slightly reddened and skin is intact- pt pulling which is causing redness to wrists. Palpable radial pulses bilaterally and cap refill less than 2 seconds.  Left ankle has red mark, skin remains intact. Shackles moved to right ankle per officer for pressure relief.

## 2015-12-06 NOTE — ED Notes (Signed)
Security wanded pt ?

## 2015-12-06 NOTE — ED Notes (Signed)
Pt attempting to rip the sharps container off the wall. Pt jerking the drawers in the room trying to break them off the cabinets. Pt getting more agitated. Dr. Thurnell Garbe notified, order given for Ativan 2mg  IV.
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
## 2015-12-06 NOTE — ED Notes (Addendum)
Per EMS, pt experiencing hallucinations and "hearing voices." EMS states she reports, "a man standing at her front door holding a gun." Pt also told EMS that she "woke up under her porch this morning and was playing in the mud." States she thought she was on "Survivor." Pt hx of psychosis. Pt calm at this time. Denies SI/HI
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
## 2015-12-06 NOTE — ED Provider Notes (Signed)
CSN: [id number]     Arrival date & time 12/06/15  1139 History   First MD Initiated Contact with Patient 12/06/15 1148     Chief Complaint  Patient presents with  . V70.1     (Consider location/radiation/quality/duration/timing/severity/associated sxs/prior Treatment) HPI....level 5 caveat for psychosis.   Patient complains of auditory hallucinations and visual hallucinations, "a man standing at the front door holding a gun". She thought she was on the show "Survivor".  History of psychosis, depression, anxiety, insomnia  Past Medical History  Diagnosis Date  . Allergy   . Migraine without aura   . Anxiety   . Depression   . Dysmenorrhea   . PMDD (premenstrual dysphoric disorder)   . Insomnia   . Nephrolithiasis   . Finger fracture, left 10/31/2012    LEFT 4th finger  . Chronic left shoulder pain 08/2012  . Asthma     exacerbated by bronchitisi   History reviewed. No pertinent past surgical history. Family History  Problem Relation Age of Onset  . Migraines Mother   . Mental illness Mother   . Heart disease Father   . Alcohol abuse Father   . Arthritis Maternal Grandmother   . Thyroid disease Maternal Grandmother   . Heart disease Maternal Grandfather     AMI 1996, 2014  . Anemia Maternal Grandfather     bone marrow dysfunction   Social History  Substance Use Topics  . Smoking status: Current Some Day Smoker -- 1.00 packs/day for 15 years    Types: Cigarettes  . Smokeless tobacco: Never Used     Comment: using e-cig to try to quit  . Alcohol Use: No   OB History    Gravida Para Term Preterm AB TAB SAB Ectopic Multiple Living   '3 0 0 0 3 3 0 0 0 0 '$     Review of Systems  Reason unable to perform ROS: Psychosis.      Allergies  Penicillins; Progestins; and Sinequan  Home Medications   Prior to Admission medications   Medication Sig Start Date End Date Taking? Authorizing Provider  albuterol (PROVENTIL HFA;VENTOLIN HFA) 108 (90 Base) MCG/ACT inhaler  Inhale 2 puffs into the lungs every 4 (four) hours as needed for wheezing or shortness of breath (cough, shortness of breath or wheezing.). 10/24/15   [name] Jeffery, PA-C  ALPRAZolam (XANAX XR) 2 MG 24 hr tablet Take 2 mg by mouth at bedtime.  06/17/15   Historical Provider, MD  ALPRAZolam Duanne Moron) 1 MG tablet Take 1 mg by mouth 4 (four) times daily.  06/22/15   Historical Provider, MD  amphetamine-dextroamphetamine (ADDERALL) 20 MG tablet Take 20 mg by mouth 3 (three) times daily.  05/05/15   Historical Provider, MD  benzonatate (TESSALON) 100 MG capsule Take 1-2 capsules (100-200 mg total) by mouth 3 (three) times daily as needed for cough. 07/29/15   [name] Jeffery, PA-C  cyclobenzaprine (FLEXERIL) 10 MG tablet TAKE 1/2 TO 1 TABLET BY MOUTH 3 TIMES DAILY AS NEEDED FOR MUSCLE SPASMS 12/01/15   [name] Jeffery, PA-C  gabapentin (NEURONTIN) 800 MG tablet  07/15/15   Historical Provider, MD  HYDROcodone-acetaminophen (NORCO) 10-325 MG tablet Take 1 tablet by mouth every 8 (eight) hours as needed. Cannot fill until 12/04/15 11/27/15   Ezekiel Slocumb, PA-C  ibuprofen (ADVIL,MOTRIN) 800 MG tablet Take 1 tablet (800 mg total) by mouth 3 (three) times daily. 07/08/15   Fransico Meadow, PA-C  levothyroxine (SYNTHROID, LEVOTHROID) 50 MCG tablet Take 1 tablet (50 mcg  total) by mouth daily 09/07/15   [name] Jeffery, PA-C  lidocaine (LIDODERM) 5 % Place 2 patches onto the skin daily. Remove and discard patch within 12 hours. 09/07/15   [name] Jeffery, PA-C  mometasone (NASONEX) 50 MCG/ACT nasal spray Place 2 sprays into the nose daily. 10/24/15   [name] Jeffery, PA-C  Multiple Vitamins-Minerals (MULTIVITAMINS THER. W/MINERALS) TABS Take 1 tablet by mouth daily.     Historical Provider, MD  Nicotine 21-14-7 MG/24HR KIT Use as directed 07/29/15   Harrison Mons, PA-C  oxyCODONE-acetaminophen (PERCOCET) 7.5-325 MG tablet Take 1 tablet by mouth every 8 (eight) hours as needed for severe pain. 08/05/15   [name] Jeffery, PA-C   predniSONE (DELTASONE) 20 MG tablet Take 3 PO QAM x3days, 2 PO QAM x3days, 1 PO QAM x3days 07/25/15   [name] Jeffery, PA-C  pregabalin (LYRICA) 200 MG capsule Take 1 capsule (200 mg total) by mouth 3 (three) times daily. 10/24/15   [name] Jeffery, PA-C  QUEtiapine (SEROQUEL) 400 MG tablet Take 1 tablet (400 mg total) by mouth at bedtime. Patient taking differently: Take 800 mg by mouth at bedtime.  01/21/15   Shuvon B Rankin, NP  zolpidem (AMBIEN) 10 MG tablet Take 10 mg by mouth at bedtime as needed for sleep.  06/22/15   Historical Provider, MD   BP 138/70 mmHg  Pulse 115  Temp(Src) 98.6 F (37 C) (Oral)  Resp 20  Ht 5' 9" (1.753 m)  Wt 170 lb (77.111 kg)  BMI 25.09 kg/m2  SpO2 100%  LMP  (LMP Unknown) Physical Exam  Constitutional:  Agitated  HENT:  Head: Normocephalic and atraumatic.  Eyes: Conjunctivae and EOM are normal. Pupils are equal, round, and reactive to light.  Neck: Normal range of motion. Neck supple.  Cardiovascular: Normal rate and regular rhythm.   Pulmonary/Chest: Effort normal and breath sounds normal.  Abdominal: Soft. Bowel sounds are normal.  Musculoskeletal: Normal range of motion.  Neurological: She is alert.  Skin: Skin is warm and dry.  Psychiatric:  Flight of ideas, tangential thinking  Nursing note and vitals reviewed.   ED Course  Procedures (including critical care time) Labs Review Labs Reviewed  CBC WITH DIFFERENTIAL/PLATELET  BASIC METABOLIC PANEL  ETHANOL  URINE RAPID DRUG SCREEN, HOSP PERFORMED    Imaging Review No results found. I have personally reviewed and evaluated these images and lab results as part of my medical decision-making.   EKG Interpretation None      MDM   Final diagnoses:  Other psychotic disorder not due to substance or known physiological condition    Patient appears psychotic. Will do involuntary commitment papers and consult behavioral health. Geodon 10 mg IM    Nat Christen, MD 12/06/15 1253
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
## 2015-12-06 NOTE — ED Notes (Signed)
Pt wrists are red while pulling on restraints, when at rest no redness observed.

## 2015-12-06 NOTE — ED Notes (Signed)
Pt states, "I need to get in a room, I can't stay out here in the hallway, it's frightening me". Pt is trembling, eyes opened very wide and pt has pressured speech. Dr. Lacinda Axon notified. Order given for Geodon IM.

## 2015-12-07 ENCOUNTER — Telehealth: Payer: Self-pay | Admitting: Physician Assistant

## 2015-12-07 MED ORDER — LORAZEPAM 1 MG PO TABS
1.0000 mg | ORAL_TABLET | ORAL | Status: AC | PRN
  Administered 2015-12-07: 1 mg via ORAL
  Filled 2015-12-07: qty 1

## 2015-12-07 MED ORDER — LORAZEPAM 2 MG/ML IJ SOLN
2.0000 mg | Freq: Once | INTRAMUSCULAR | Status: AC
  Administered 2015-12-07: 2 mg via INTRAMUSCULAR
  Filled 2015-12-07: qty 1

## 2015-12-07 MED ORDER — STERILE WATER FOR INJECTION IJ SOLN
INTRAMUSCULAR | Status: AC
  Filled 2015-12-07: qty 10

## 2015-12-07 MED ORDER — ZIPRASIDONE MESYLATE 20 MG IM SOLR
20.0000 mg | INTRAMUSCULAR | Status: AC | PRN
  Administered 2015-12-07: 20 mg via INTRAMUSCULAR
  Filled 2015-12-07 (×2): qty 20

## 2015-12-07 MED ORDER — ZIPRASIDONE MESYLATE 20 MG IM SOLR
10.0000 mg | Freq: Two times a day (BID) | INTRAMUSCULAR | Status: DC
  Administered 2015-12-07 – 2015-12-08 (×2): 10 mg via INTRAMUSCULAR
  Filled 2015-12-07 (×2): qty 20

## 2015-12-07 MED ORDER — OLANZAPINE 5 MG PO TBDP
10.0000 mg | ORAL_TABLET | Freq: Three times a day (TID) | ORAL | Status: DC | PRN
  Administered 2015-12-07: 10 mg via ORAL
  Filled 2015-12-07 (×2): qty 2

## 2015-12-07 MED ORDER — NICOTINE 21 MG/24HR TD PT24
21.0000 mg | MEDICATED_PATCH | Freq: Every day | TRANSDERMAL | Status: DC
  Administered 2015-12-07 – 2015-12-08 (×2): 21 mg via TRANSDERMAL
  Filled 2015-12-07 (×2): qty 1

## 2015-12-07 MED ORDER — ZIPRASIDONE MESYLATE 20 MG IM SOLR
20.0000 mg | Freq: Once | INTRAMUSCULAR | Status: AC
  Administered 2015-12-07: 20 mg via INTRAMUSCULAR
  Filled 2015-12-07: qty 20

## 2015-12-07 MED ORDER — MOMETASONE FUROATE 50 MCG/ACT NA SUSP: 2.0000 | Freq: Every day | NASAL | Status: DC

## 2015-12-07 MED ORDER — DIPHENHYDRAMINE HCL 50 MG/ML IJ SOLN
50.0000 mg | Freq: Once | INTRAMUSCULAR | Status: AC
Start: 2015-12-07 — End: 2015-12-07
  Administered 2015-12-07: 50 mg via INTRAMUSCULAR
  Filled 2015-12-07: qty 1

## 2015-12-07 MED ORDER — ZOLPIDEM TARTRATE 5 MG PO TABS
10.0000 mg | ORAL_TABLET | Freq: Every evening | ORAL | Status: DC | PRN
  Administered 2015-12-07: 10 mg via ORAL
  Filled 2015-12-07: qty 2

## 2015-12-07 MED ORDER — HALOPERIDOL LACTATE 5 MG/ML IJ SOLN
10.0000 mg | Freq: Once | INTRAMUSCULAR | Status: AC
  Administered 2015-12-07: 10 mg via INTRAMUSCULAR
  Filled 2015-12-07: qty 2

## 2015-12-07 NOTE — ED Notes (Signed)
Pt thrashing on stretcher, constant talking and intermittent yelling. Not answering questions appropriately. Appears to be responding to internal stimuli. Making comments like "just come at me anytime".

## 2015-12-07 NOTE — ED Notes (Signed)
Pt boyfriend, Cleatrice Burke, called from boss cell phone for update on pt. Informed still looking for inpatient bed.

## 2015-12-07 NOTE — ED Notes (Signed)
Patient having increased anxiety and agitation, given PRN Zyprexa and Geodon, patient cooperative, given phone to call family.

## 2015-12-07 NOTE — ED Notes (Signed)
Pt given water and states "what you think we can hydrate these people away". Pt laughing inappropriately and continues with word salad.

## 2015-12-07 NOTE — Telephone Encounter (Signed)
Spoke with Summer. Requesting refills of Nasonex, which she reports patient has been filling there. I note a year's worth of refills were sent to [facility] [place] on 10/24/15. Sent Rx to [facility] with recommendation that they discuss with her the importance of selecting a single pharmacy for her prescriptions.  Meds ordered this encounter  Medications  . mometasone (NASONEX) 50 MCG/ACT nasal spray    Sig: Place 2 sprays into the nose daily.    Dispense:  51 g    Refill:  3    Order Specific Question:  Supervising Provider    Answer:  DOOLITTLE, ROBERT P [id number]

## 2015-12-07 NOTE — ED Notes (Signed)
Pt released from restraints, ambulated to bathroom with nurse and officer at side, pt refused bathroom privileges, alert but disoriented, pt attempted to grab a bottle of water off of counter while walking past nurses station, redirected not to do this. Informed that this nurse would bring her water once back in her room.  Restraints were reapplied. Skin assessed, wrists are reddened and skin is intact- pt pulling which is causing redness to wrists. Palpable radial pulses bilaterally and cap refill less than 2 seconds.  Right ankle skin check reveals skin within normal limits.

## 2015-12-07 NOTE — ED Notes (Signed)
Pt appears to be sleeping, restless at times. Rise and fall of chest noted.

## 2015-12-07 NOTE — ED Provider Notes (Signed)
12:49 AM Frederico Hamman, PA at [facility] states to repeat geodon 20 mg, Ativan 2 mg and benadryl 50 mg IM and try to get a EKG.    EKG Interpretation  Date/Time:  Thursday December 07 2015 01:00:43 EDT Ventricular Rate:  101 PR Interval:  116 QRS Duration: 90 QT Interval:  336 QTC Calculation: 435 R Axis:   84 Text Interpretation:  Sinus tachycardia Minimal ST depression, inferior leads Baseline wander in lead(s) II III aVR aVF No old tracing to compare Confirmed by Jericho Cieslik  MD-I, Omie Ferger ([id number]) on 12/07/2015 1:07:08 AM      4:05 AM I spoke to [place], [place] about patient still being agitated. She can still be heard yelling around the emergency department she is sitting up in the stretcher trying to get out of her stretcher. He states he will talk to his psychiatrist call me back.  4:15 AM Frederico Hamman called back and states the psychiatrist states to give patient Ativan 2 mg IM and then an hour later started on Geodon 10 mg every 12 hours and Ativan 2 mg every 6 hours. We discussed that this is the same medication we have been using without improvement of her behavior and he acknowledges that.  Rolland Porter, MD, Barbette Or, MD 12/07/15 [phone]

## 2015-12-07 NOTE — ED Notes (Signed)
Pt jerking arms and hitting side rails, yelling obscenities and attempting to spit on staff.

## 2015-12-07 NOTE — ED Notes (Addendum)
Pt's boyfriend mother, Laverna Peace [phone]. Reports pt father died x 1 week ago. Pt was estranged from father since birth. Mrs. Gilford Rile also states son's phone is not in service and pt has no other family.

## 2015-12-07 NOTE — Significant Event (Cosign Needed)
Contacted by [facility] EDP , Dr. Tomi Bamberger due to concerns with the patient demonstrating continued agitation, hallucinations and delusions. I, spoke with the on-call MD, Dr. Shea Evans at 04:15 am on 3/30  and communicated the same concerns, including results of recent EKG and labs. Per Dr Shea Evans it is reccommended to continue with IV or IM Ativan 2 mg q 6 hours and Geodon 10 mg IM q 12 hours prn psychosis. TTS will continue to seek placement    On call: Eappen MD

## 2015-12-07 NOTE — ED Notes (Signed)
Pt ambulated to restroom. 
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
## 2015-12-07 NOTE — ED Notes (Addendum)
Pt assisted to drink 8 oz of water. Pt consumed all water without difficulty. Pt is diaphoretic from pulling at restraints to arms and flaying/moving up and down in bed.  Law officer remains at bedside. When obtaining vitals, pt talking out loud as if she is on phone talking with father.  No clear conversation present. Pt is alert, talking to people who are not present. conversation cannot be followed and are not sensible.

## 2015-12-07 NOTE — ED Notes (Signed)
Patient placed in new bottoms, hair combed, and bed remade.

## 2015-12-07 NOTE — ED Notes (Signed)
Pt remains anxious, will attempt to follow simple commands, however for the most pt is very easily distracted and continues to be uncooperative. Pt is pulling at wrist restraints, increasing the redness to skin. Law officer remains at bedside.  Attempted to ambulate pt, however d/T pt increased agitation/anxiousness, unable to safely ambulate.  ROM to all extremities performed to upper and lower extremities.  Pt given two 8 oz cups of water with assistance.

## 2015-12-07 NOTE — ED Notes (Signed)
Pt ambulated around stretcher in room.

## 2015-12-07 NOTE — ED Notes (Signed)
Pt continues yelling and thrashing on stretcher. Pt removed paper scrub bottoms by tearing biting and kicking. EDP notified and ordered given.

## 2015-12-08 MED ORDER — STERILE WATER FOR INJECTION IJ SOLN
INTRAMUSCULAR | Status: AC
  Filled 2015-12-08: qty 10

## 2015-12-08 NOTE — ED Notes (Signed)
Per Erasmo Downer at [facility] to seek inpatient placement.

## 2015-12-08 NOTE — ED Provider Notes (Signed)
Patient accepted to [facility] by Dr. Selinda Flavin.   BP 115/72 mmHg  Pulse 83  Temp(Src) 98.2 F (36.8 C) (Oral)  Resp 16  Ht 5\' 9"  (1.753 m)  Wt 170 lb (77.111 kg)  BMI 25.09 kg/m2  SpO2 98%  LMP  (LMP Unknown)   Ezequiel Essex, MD 12/08/15 1313

## 2015-12-08 NOTE — Progress Notes (Signed)
Followed up on inpatient referrals. Also considered for admission to [facility] upon appropriate bed availability.  Referred to: [facility]- per Mechele Claude. [facility] called back requesting update on pt's behavior and medications today. Provided general information available in chart and referred to her [facility] for specifics needed. [facility]- per [facility]  Declined: [facility]  Both due to acuity being too high for unit  Initiated referral to [facility]. Completed referral form and faxed to [facility] requesting authorization number for referral. Will refer to [facility] when auth# provided.  Sharren Bridge, MSW, LCSW Clinical Social Work, Disposition  12/08/2015 [phone]

## 2015-12-08 NOTE — ED Notes (Addendum)
Per Nunzio Cory pt accepted by Dr. Selinda Flavin at [facility] [phone].

## 2015-12-11 ENCOUNTER — Telehealth: Payer: Self-pay

## 2015-12-11 NOTE — Telephone Encounter (Signed)
Pt is in a [facility] in [place]. In in the process of going to different mental hospitals, she think she has misplaced her meds. She needs [name] to write meds for pain and cyclobenzoprem medication. She is having hallucinations due to stress and her dad passed away last week. She will be back hopefully Friday. She uses a payphone to call because she is not allowed to use her cellphone. We can contact her boyfriend Gaspar Bidding and speak to him she states.  Ihor Austin her boyfriend [phone]

## 2015-12-12 NOTE — Telephone Encounter (Signed)
Holland Falling  [phone]   Claim number [id number]  Id  [id number]   Please extend the letter to March 22 and fax to the above number.   She is in the hospital.

## 2015-12-12 NOTE — Telephone Encounter (Signed)
When someone is in the hospital, it's up to the providers there to prescribe the medications while she is in the hospital, and to discharge her with enough to get in to the PCP for follow-up. If she is discharged on Friday, she should come in to see me over the weekend. I'm here both Saturday and Sunday. If it's next week, I'm in the office on Wednesday 1p-6p and Friday 8a-6p.

## 2015-12-13 ENCOUNTER — Encounter: Payer: Self-pay | Admitting: Physician Assistant

## 2015-12-13 DIAGNOSIS — M25552 Pain in left hip: Secondary | ICD-10-CM
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
## 2015-12-14 NOTE — Telephone Encounter (Signed)
"  The Letter?" Again, if this is related to her hospital admission, it will be handled by the providers caring for her there, or I can address it when she follows up with me upon hospital discharge.

## 2015-12-14 NOTE — Telephone Encounter (Signed)
Left message for pt to call back  °

## 2015-12-14 NOTE — Telephone Encounter (Signed)
OK to fax any letter already written to the number of her choice.  She'll need to see me for prescriptions, and the hospital is responsible for given her enough medication to last to follow up (within reason).

## 2015-12-15 NOTE — Telephone Encounter (Signed)
SPoke to chelle through [employer]

## 2015-12-19 MED ORDER — HYDROCODONE-ACETAMINOPHEN 10-325 MG PO TABS: 1.0000 | ORAL_TABLET | Freq: Three times a day (TID) | ORAL | Status: DC | PRN

## 2015-12-19 NOTE — Telephone Encounter (Signed)
Rx printed at 104. Will bring to 102 after clinic. Patient notified via My Chart.   Meds ordered this encounter  Medications  . HYDROcodone-acetaminophen (NORCO) 10-325 MG tablet    Sig: Take 1 tablet by mouth every 8 (eight) hours as needed. Cannot fill until 12/04/15    Dispense:  45 tablet    Refill:  0    This is a two-week supply. Patient lost medication filled 3/29 when she was admitted to hospital. Office visit with me required for additional fills.    Order Specific Question:  Supervising Provider    Answer:  Tami Lin P [id number]

## 2016-01-01 ENCOUNTER — Other Ambulatory Visit: Payer: Self-pay | Admitting: Physician Assistant

## 2016-01-01 ENCOUNTER — Encounter: Payer: Self-pay | Admitting: Physician Assistant

## 2016-01-04 DIAGNOSIS — Z029 Encounter for administrative examinations, unspecified: Secondary | ICD-10-CM

## 2016-01-18 ENCOUNTER — Ambulatory Visit (INDEPENDENT_AMBULATORY_CARE_PROVIDER_SITE_OTHER): Payer: BLUE CROSS/BLUE SHIELD | Admitting: Physician Assistant

## 2016-01-18 VITALS — BP 102/66 | HR 79 | Temp 98.6°F | Resp 16 | Ht 68.0 in | Wt 169.4 lb

## 2016-01-18 DIAGNOSIS — E039 Hypothyroidism, unspecified: Secondary | ICD-10-CM

## 2016-01-18 DIAGNOSIS — F3281 Premenstrual dysphoric disorder: Secondary | ICD-10-CM

## 2016-01-18 DIAGNOSIS — M25512 Pain in left shoulder: Secondary | ICD-10-CM | POA: Diagnosis not present

## 2016-01-18 DIAGNOSIS — L659 Nonscarring hair loss, unspecified: Secondary | ICD-10-CM | POA: Diagnosis not present

## 2016-01-18 DIAGNOSIS — M25552 Pain in left hip: Secondary | ICD-10-CM | POA: Diagnosis not present

## 2016-01-18 DIAGNOSIS — G8929 Other chronic pain: Secondary | ICD-10-CM

## 2016-01-18 LAB — CBC WITH DIFFERENTIAL/PLATELET
BASOS PCT: 0 %
Basophils Absolute: 0 cells/uL (ref 0–200)
Eosinophils Absolute: 120 cells/uL (ref 15–500)
Eosinophils Relative: 2 %
HEMATOCRIT: 44 % (ref 35.0–45.0)
Hemoglobin: 14.3 g/dL (ref 11.7–15.5)
LYMPHS PCT: 47 %
Lymphs Abs: 2820 cells/uL (ref 850–3900)
MCH: 30 pg (ref 27.0–33.0)
MCHC: 32.5 g/dL (ref 32.0–36.0)
MCV: 92.4 fL (ref 80.0–100.0)
MONO ABS: 360 {cells}/uL (ref 200–950)
MONOS PCT: 6 %
MPV: 11.7 fL (ref 7.5–12.5)
NEUTROS PCT: 45 %
Neutro Abs: 2700 cells/uL (ref 1500–7800)
PLATELETS: 252 10*3/uL (ref 140–400)
RBC: 4.76 MIL/uL (ref 3.80–5.10)
RDW: 13.5 % (ref 11.0–15.0)
WBC: 6 10*3/uL (ref 3.8–10.8)

## 2016-01-18 LAB — COMPREHENSIVE METABOLIC PANEL
ALT: 8 U/L (ref 6–29)
AST: 12 U/L (ref 10–30)
Albumin: 4.4 g/dL (ref 3.6–5.1)
Alkaline Phosphatase: 82 U/L (ref 33–115)
BUN: 12 mg/dL (ref 7–25)
CALCIUM: 9.2 mg/dL (ref 8.6–10.2)
CO2: 27 mmol/L (ref 20–31)
Chloride: 102 mmol/L (ref 98–110)
Creat: 0.65 mg/dL (ref 0.50–1.10)
GLUCOSE: 84 mg/dL (ref 65–99)
POTASSIUM: 4.5 mmol/L (ref 3.5–5.3)
Sodium: 136 mmol/L (ref 135–146)
Total Bilirubin: 0.4 mg/dL (ref 0.2–1.2)
Total Protein: 6.9 g/dL (ref 6.1–8.1)

## 2016-01-18 LAB — TSH: TSH: 1.97 mIU/L

## 2016-01-18 MED ORDER — LIDOCAINE 5 % EX PTCH: 2.0000 | MEDICATED_PATCH | CUTANEOUS | Status: DC

## 2016-01-18 MED ORDER — PREGABALIN 200 MG PO CAPS: 200.0000 mg | ORAL_CAPSULE | Freq: Three times a day (TID) | ORAL | Status: DC

## 2016-01-18 MED ORDER — HYDROCODONE-ACETAMINOPHEN 10-325 MG PO TABS: 1.0000 | ORAL_TABLET | Freq: Three times a day (TID) | ORAL | Status: DC | PRN
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
## 2016-01-18 NOTE — Patient Instructions (Signed)
     IF you received an x-ray today, you will receive an invoice from [facility]. Please contact [facility] at [phone] with questions or concerns regarding your invoice.   IF you received labwork today, you will receive an invoice from [facility]. Please contact [facility] at [phone] with questions or concerns regarding your invoice.   Our billing staff will not be able to assist you with questions regarding bills from these companies.  You will be contacted with the lab results as soon as they are available. The fastest way to get your results is to activate your My Chart account. Instructions are located on the last page of this paperwork. If you have not heard from us regarding the results in 2 weeks, please contact this office.      

## 2016-01-22 ENCOUNTER — Telehealth: Payer: Self-pay

## 2016-01-22 NOTE — Telephone Encounter (Signed)
[name],  Please see comments below.   Thanks  [employer]
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
## 2016-01-23 NOTE — Telephone Encounter (Signed)
Called pt, left on VM to contact Dr. Toy Care for the medication.

## 2016-01-24 DIAGNOSIS — Z0271 Encounter for disability determination: Secondary | ICD-10-CM

## 2016-01-29 ENCOUNTER — Emergency Department (HOSPITAL_COMMUNITY): Payer: No Typology Code available for payment source

## 2016-01-29 ENCOUNTER — Encounter (HOSPITAL_COMMUNITY): Payer: Self-pay | Admitting: Emergency Medicine

## 2016-01-29 ENCOUNTER — Inpatient Hospital Stay (HOSPITAL_COMMUNITY): Payer: No Typology Code available for payment source

## 2016-01-29 ENCOUNTER — Inpatient Hospital Stay (HOSPITAL_COMMUNITY)
Admission: EM | Admit: 2016-01-29 | Discharge: 2016-02-09 | DRG: 459 | Disposition: A | Discharge status: Home Health | Payer: No Typology Code available for payment source | Attending: General Surgery | Admitting: General Surgery

## 2016-01-29 DIAGNOSIS — F419 Anxiety disorder, unspecified: Secondary | ICD-10-CM | POA: Diagnosis present

## 2016-01-29 DIAGNOSIS — F17200 Nicotine dependence, unspecified, uncomplicated: Secondary | ICD-10-CM | POA: Diagnosis present

## 2016-01-29 DIAGNOSIS — S12201A Unspecified nondisplaced fracture of third cervical vertebra, initial encounter for closed fracture: Secondary | ICD-10-CM | POA: Diagnosis not present

## 2016-01-29 DIAGNOSIS — S0181XA Laceration without foreign body of other part of head, initial encounter: Secondary | ICD-10-CM | POA: Diagnosis present

## 2016-01-29 DIAGNOSIS — S060X9A Concussion with loss of consciousness of unspecified duration, initial encounter: Secondary | ICD-10-CM

## 2016-01-29 DIAGNOSIS — S32009A Unspecified fracture of unspecified lumbar vertebra, initial encounter for closed fracture: Secondary | ICD-10-CM | POA: Diagnosis present

## 2016-01-29 DIAGNOSIS — S22081A Stable burst fracture of T11-T12 vertebra, initial encounter for closed fracture: Principal | ICD-10-CM | POA: Diagnosis present

## 2016-01-29 DIAGNOSIS — Z79899 Other long term (current) drug therapy: Secondary | ICD-10-CM

## 2016-01-29 DIAGNOSIS — S129XXA Fracture of neck, unspecified, initial encounter: Secondary | ICD-10-CM | POA: Diagnosis not present

## 2016-01-29 DIAGNOSIS — Z720 Tobacco use: Secondary | ICD-10-CM | POA: Insufficient documentation

## 2016-01-29 DIAGNOSIS — S12200A Unspecified displaced fracture of third cervical vertebra, initial encounter for closed fracture: Secondary | ICD-10-CM | POA: Diagnosis present

## 2016-01-29 DIAGNOSIS — S0101XA Laceration without foreign body of scalp, initial encounter: Secondary | ICD-10-CM | POA: Diagnosis present

## 2016-01-29 DIAGNOSIS — H1133 Conjunctival hemorrhage, bilateral: Secondary | ICD-10-CM | POA: Diagnosis present

## 2016-01-29 DIAGNOSIS — E876 Hypokalemia: Secondary | ICD-10-CM | POA: Diagnosis not present

## 2016-01-29 DIAGNOSIS — J45909 Unspecified asthma, uncomplicated: Secondary | ICD-10-CM | POA: Diagnosis present

## 2016-01-29 DIAGNOSIS — N390 Urinary tract infection, site not specified: Secondary | ICD-10-CM | POA: Diagnosis not present

## 2016-01-29 DIAGNOSIS — S2239XA Fracture of one rib, unspecified side, initial encounter for closed fracture: Secondary | ICD-10-CM

## 2016-01-29 DIAGNOSIS — R402432 Glasgow coma scale score 3-8, at arrival to emergency department: Secondary | ICD-10-CM | POA: Diagnosis present

## 2016-01-29 DIAGNOSIS — S42002A Fracture of unspecified part of left clavicle, initial encounter for closed fracture: Secondary | ICD-10-CM | POA: Diagnosis present

## 2016-01-29 DIAGNOSIS — J9601 Acute respiratory failure with hypoxia: Secondary | ICD-10-CM | POA: Diagnosis present

## 2016-01-29 DIAGNOSIS — S272XXA Traumatic hemopneumothorax, initial encounter: Secondary | ICD-10-CM | POA: Diagnosis not present

## 2016-01-29 DIAGNOSIS — R451 Restlessness and agitation: Secondary | ICD-10-CM | POA: Diagnosis not present

## 2016-01-29 DIAGNOSIS — S60222A Contusion of left hand, initial encounter: Secondary | ICD-10-CM | POA: Diagnosis present

## 2016-01-29 DIAGNOSIS — F4323 Adjustment disorder with mixed anxiety and depressed mood: Secondary | ICD-10-CM | POA: Diagnosis present

## 2016-01-29 DIAGNOSIS — F1320 Sedative, hypnotic or anxiolytic dependence, uncomplicated: Secondary | ICD-10-CM | POA: Diagnosis present

## 2016-01-29 DIAGNOSIS — J811 Chronic pulmonary edema: Secondary | ICD-10-CM

## 2016-01-29 DIAGNOSIS — F329 Major depressive disorder, single episode, unspecified: Secondary | ICD-10-CM | POA: Diagnosis present

## 2016-01-29 DIAGNOSIS — Z9689 Presence of other specified functional implants: Secondary | ICD-10-CM

## 2016-01-29 DIAGNOSIS — S12201D Unspecified nondisplaced fracture of third cervical vertebra, subsequent encounter for fracture with routine healing: Secondary | ICD-10-CM | POA: Diagnosis not present

## 2016-01-29 DIAGNOSIS — S42009A Fracture of unspecified part of unspecified clavicle, initial encounter for closed fracture: Secondary | ICD-10-CM

## 2016-01-29 DIAGNOSIS — J939 Pneumothorax, unspecified: Secondary | ICD-10-CM | POA: Diagnosis not present

## 2016-01-29 DIAGNOSIS — R0602 Shortness of breath: Secondary | ICD-10-CM

## 2016-01-29 DIAGNOSIS — S22009A Unspecified fracture of unspecified thoracic vertebra, initial encounter for closed fracture: Secondary | ICD-10-CM | POA: Diagnosis present

## 2016-01-29 DIAGNOSIS — R52 Pain, unspecified: Secondary | ICD-10-CM | POA: Diagnosis not present

## 2016-01-29 DIAGNOSIS — S42102A Fracture of unspecified part of scapula, left shoulder, initial encounter for closed fracture: Secondary | ICD-10-CM | POA: Diagnosis present

## 2016-01-29 DIAGNOSIS — M25522 Pain in left elbow: Secondary | ICD-10-CM

## 2016-01-29 DIAGNOSIS — S299XXA Unspecified injury of thorax, initial encounter: Secondary | ICD-10-CM

## 2016-01-29 DIAGNOSIS — S060XAA Concussion with loss of consciousness status unknown, initial encounter: Secondary | ICD-10-CM | POA: Diagnosis present

## 2016-01-29 DIAGNOSIS — R402411 Glasgow coma scale score 13-15, in the field [EMT or ambulance]: Secondary | ICD-10-CM | POA: Diagnosis present

## 2016-01-29 DIAGNOSIS — R1310 Dysphagia, unspecified: Secondary | ICD-10-CM | POA: Diagnosis present

## 2016-01-29 DIAGNOSIS — J9690 Respiratory failure, unspecified, unspecified whether with hypoxia or hypercapnia: Secondary | ICD-10-CM

## 2016-01-29 DIAGNOSIS — S27329A Contusion of lung, unspecified, initial encounter: Secondary | ICD-10-CM

## 2016-01-29 DIAGNOSIS — J9600 Acute respiratory failure, unspecified whether with hypoxia or hypercapnia: Secondary | ICD-10-CM | POA: Diagnosis present

## 2016-01-29 DIAGNOSIS — R000 Tachycardia, unspecified: Secondary | ICD-10-CM

## 2016-01-29 DIAGNOSIS — D62 Acute posthemorrhagic anemia: Secondary | ICD-10-CM | POA: Diagnosis present

## 2016-01-29 DIAGNOSIS — S060X9D Concussion with loss of consciousness of unspecified duration, subsequent encounter: Secondary | ICD-10-CM | POA: Diagnosis not present

## 2016-01-29 DIAGNOSIS — S2243XA Multiple fractures of ribs, bilateral, initial encounter for closed fracture: Secondary | ICD-10-CM | POA: Diagnosis present

## 2016-01-29 DIAGNOSIS — Z419 Encounter for procedure for purposes other than remedying health state, unspecified: Secondary | ICD-10-CM

## 2016-01-29 DIAGNOSIS — T148XXA Other injury of unspecified body region, initial encounter: Secondary | ICD-10-CM

## 2016-01-29 DIAGNOSIS — R0682 Tachypnea, not elsewhere classified: Secondary | ICD-10-CM

## 2016-01-29 DIAGNOSIS — S12300A Unspecified displaced fracture of fourth cervical vertebra, initial encounter for closed fracture: Secondary | ICD-10-CM | POA: Diagnosis present

## 2016-01-29 DIAGNOSIS — S298XXD Other specified injuries of thorax, subsequent encounter: Secondary | ICD-10-CM | POA: Diagnosis not present

## 2016-01-29 HISTORY — DX: Unspecified mood (affective) disorder: F39

## 2016-01-29 HISTORY — DX: Concussion with loss of consciousness of unspecified duration, initial encounter: S06.0X9A

## 2016-01-29 HISTORY — DX: Unspecified displaced fracture of third cervical vertebra, initial encounter for closed fracture: S12.200A

## 2016-01-29 HISTORY — DX: Concussion with loss of consciousness status unknown, initial encounter: S06.0XAA

## 2016-01-29 LAB — TRIGLYCERIDES: TRIGLYCERIDES: 79 mg/dL (ref <150)

## 2016-01-29 LAB — PROTIME-INR
INR: 1.21 (ref 0.00–1.49)
PROTHROMBIN TIME: 15.5 s — AB (ref 11.6–15.2)

## 2016-01-29 LAB — I-STAT ARTERIAL BLOOD GAS, ED
Acid-Base Excess: 1 mmol/L (ref 0.0–2.0)
Bicarbonate: 27.1 mEq/L — ABNORMAL HIGH (ref 20.0–24.0)
O2 Saturation: 97 %
PCO2 ART: 48.7 mmHg — AB (ref 35.0–45.0)
PH ART: 7.353 (ref 7.350–7.450)
PO2 ART: 96 mmHg (ref 80.0–100.0)
Patient temperature: 98.5
TCO2: 29 mmol/L (ref 0–100)

## 2016-01-29 LAB — I-STAT CHEM 8, ED
BUN: 14 mg/dL (ref 6–20)
CALCIUM ION: 1.18 mmol/L (ref 1.12–1.23)
CHLORIDE: 99 mmol/L — AB (ref 101–111)
Creatinine, Ser: 1 mg/dL (ref 0.44–1.00)
Glucose, Bld: 158 mg/dL — ABNORMAL HIGH (ref 65–99)
HCT: 43 % (ref 36.0–46.0)
HEMOGLOBIN: 14.6 g/dL (ref 12.0–15.0)
Potassium: 3.4 mmol/L — ABNORMAL LOW (ref 3.5–5.1)
SODIUM: 142 mmol/L (ref 135–145)
TCO2: 30 mmol/L (ref 0–100)

## 2016-01-29 LAB — COMPREHENSIVE METABOLIC PANEL
ALBUMIN: 3.5 g/dL (ref 3.5–5.0)
ALK PHOS: 99 U/L (ref 38–126)
ALT: 415 U/L — ABNORMAL HIGH (ref 14–54)
ANION GAP: 10 (ref 5–15)
AST: 796 U/L — ABNORMAL HIGH (ref 15–41)
BUN: 12 mg/dL (ref 6–20)
CALCIUM: 9.1 mg/dL (ref 8.9–10.3)
CHLORIDE: 104 mmol/L (ref 101–111)
CO2: 27 mmol/L (ref 22–32)
Creatinine, Ser: 0.97 mg/dL (ref 0.44–1.00)
GFR calc Af Amer: >60 mL/min (ref >60)
GFR calc non Af Amer: >60 mL/min (ref >60)
GLUCOSE: 162 mg/dL — AB (ref 65–99)
POTASSIUM: 3.5 mmol/L (ref 3.5–5.1)
SODIUM: 141 mmol/L (ref 135–145)
Total Bilirubin: 0.5 mg/dL (ref 0.3–1.2)
Total Protein: 6 g/dL — ABNORMAL LOW (ref 6.5–8.1)

## 2016-01-29 LAB — URINALYSIS, ROUTINE W REFLEX MICROSCOPIC
Bilirubin Urine: NEGATIVE
GLUCOSE, UA: NEGATIVE mg/dL
Ketones, ur: 15 mg/dL — AB
Nitrite: NEGATIVE
PH: 5.5 (ref 5.0–8.0)
Protein, ur: 100 mg/dL — AB

## 2016-01-29 LAB — CBC
HEMATOCRIT: 39.2 % (ref 36.0–46.0)
HEMOGLOBIN: 12.8 g/dL (ref 12.0–15.0)
MCH: 29.2 pg (ref 26.0–34.0)
MCHC: 32.7 g/dL (ref 30.0–36.0)
MCV: 89.5 fL (ref 78.0–100.0)
Platelets: 225 10*3/uL (ref 150–400)
RBC: 4.38 MIL/uL (ref 3.87–5.11)
RDW: 13.1 % (ref 11.5–15.5)
WBC: 14.4 10*3/uL — ABNORMAL HIGH (ref 4.0–10.5)

## 2016-01-29 LAB — PREPARE FRESH FROZEN PLASMA
UNIT DIVISION: 0
Unit division: 0

## 2016-01-29 LAB — RAPID URINE DRUG SCREEN, HOSP PERFORMED
AMPHETAMINES: POSITIVE — AB
BENZODIAZEPINES: POSITIVE — AB
Barbiturates: NOT DETECTED
COCAINE: NOT DETECTED
OPIATES: POSITIVE — AB
Tetrahydrocannabinol: POSITIVE — AB

## 2016-01-29 LAB — URINE MICROSCOPIC-ADD ON

## 2016-01-29 LAB — ABO/RH: ABO/RH(D): O POS

## 2016-01-29 LAB — ETHANOL: Alcohol, Ethyl (B): <5 mg/dL (ref <5)

## 2016-01-29 LAB — I-STAT CG4 LACTIC ACID, ED: Lactic Acid, Venous: 2.57 mmol/L (ref 0.5–2.0)

## 2016-01-29 LAB — I-STAT BETA HCG BLOOD, ED (MC, WL, AP ONLY)

## 2016-01-29 LAB — MRSA PCR SCREENING: MRSA by PCR: NEGATIVE

## 2016-01-29 LAB — CDS SEROLOGY

## 2016-01-29 MED ORDER — ONDANSETRON HCL 4 MG/2ML IJ SOLN: 4.0000 mg | Freq: Four times a day (QID) | INTRAMUSCULAR | Status: DC | PRN

## 2016-01-29 MED ORDER — PROPOFOL 1000 MG/100ML IV EMUL
5.0000 ug/kg/min | Freq: Once | INTRAVENOUS | Status: AC
  Administered 2016-01-29: 10 ug/kg/min via INTRAVENOUS

## 2016-01-29 MED ORDER — PROPOFOL 1000 MG/100ML IV EMUL
0.0000 ug/kg/min | INTRAVENOUS | Status: DC
  Administered 2016-01-29: 17 ug/kg/min via INTRAVENOUS
  Administered 2016-01-30: 50 ug/kg/min via INTRAVENOUS
  Administered 2016-01-30: 30 ug/kg/min via INTRAVENOUS
  Administered 2016-01-30 (×2): 50 ug/kg/min via INTRAVENOUS
  Administered 2016-01-31 (×2): 40 ug/kg/min via INTRAVENOUS
  Administered 2016-01-31 – 2016-02-02 (×10): 50 ug/kg/min via INTRAVENOUS
  Filled 2016-01-29 (×18): qty 100

## 2016-01-29 MED ORDER — PROPOFOL 1000 MG/100ML IV EMUL
INTRAVENOUS | Status: AC
  Filled 2016-01-29: qty 100

## 2016-01-29 MED ORDER — DOCUSATE SODIUM 50 MG/5ML PO LIQD: 100.0000 mg | Freq: Two times a day (BID) | ORAL | Status: DC | PRN

## 2016-01-29 MED ORDER — PANTOPRAZOLE SODIUM 40 MG IV SOLR
40.0000 mg | Freq: Every day | INTRAVENOUS | Status: DC
  Administered 2016-01-29 – 2016-02-06 (×9): 40 mg via INTRAVENOUS
  Filled 2016-01-29 (×9): qty 40

## 2016-01-29 MED ORDER — ONDANSETRON HCL 4 MG PO TABS: 4.0000 mg | ORAL_TABLET | Freq: Four times a day (QID) | ORAL | Status: DC | PRN

## 2016-01-29 MED ORDER — POTASSIUM CHLORIDE IN NACL 20-0.9 MEQ/L-% IV SOLN
INTRAVENOUS | Status: DC
  Administered 2016-01-30 (×3): via INTRAVENOUS
  Administered 2016-01-30 – 2016-02-01 (×3): 100 mL/h via INTRAVENOUS
  Administered 2016-02-01: 16:00:00 via INTRAVENOUS
  Administered 2016-02-02: 100 mL/h via INTRAVENOUS
  Administered 2016-02-02 – 2016-02-07 (×11): via INTRAVENOUS
  Filled 2016-01-29 (×28): qty 1000

## 2016-01-29 MED ORDER — PANTOPRAZOLE SODIUM 40 MG PO TBEC
40.0000 mg | DELAYED_RELEASE_TABLET | Freq: Every day | ORAL | Status: DC
  Administered 2016-02-07 – 2016-02-08 (×2): 40 mg via ORAL
  Filled 2016-01-29 (×2): qty 1

## 2016-01-29 MED ORDER — CHLORHEXIDINE GLUCONATE 0.12% ORAL RINSE (MEDLINE KIT)
15.0000 mL | Freq: Two times a day (BID) | OROMUCOSAL | Status: DC
  Administered 2016-01-29 – 2016-02-04 (×11): 15 mL via OROMUCOSAL

## 2016-01-29 MED ORDER — SODIUM CHLORIDE 0.9 % IV SOLN
INTRAVENOUS | Status: DC
  Administered 2016-01-30: 18:00:00 via INTRAVENOUS

## 2016-01-29 MED ORDER — SUCCINYLCHOLINE CHLORIDE 20 MG/ML IJ SOLN
INTRAMUSCULAR | Status: AC | PRN
Start: 2016-01-29 — End: 2016-01-29
  Administered 2016-01-29: 100 mg via INTRAVENOUS

## 2016-01-29 MED ORDER — IOPAMIDOL (ISOVUE-300) INJECTION 61%
100.0000 mL | Freq: Once | INTRAVENOUS | Status: AC | PRN
  Administered 2016-01-29: 100 mL via INTRAVENOUS

## 2016-01-29 MED ORDER — SODIUM CHLORIDE 0.9 % IV BOLUS (SEPSIS)
1000.0000 mL | Freq: Once | INTRAVENOUS | Status: AC
  Administered 2016-01-29: 1000 mL via INTRAVENOUS

## 2016-01-29 MED ORDER — ANTISEPTIC ORAL RINSE SOLUTION (CORINZ)
7.0000 mL | Freq: Four times a day (QID) | OROMUCOSAL | Status: DC
  Administered 2016-01-30 – 2016-02-01 (×13): 7 mL via OROMUCOSAL

## 2016-01-29 MED ORDER — FENTANYL CITRATE (PF) 100 MCG/2ML IJ SOLN
100.0000 ug | INTRAMUSCULAR | Status: DC | PRN
  Administered 2016-01-29 – 2016-02-01 (×2): 100 ug via INTRAVENOUS
  Filled 2016-01-29 (×3): qty 2

## 2016-01-29 MED ORDER — SODIUM CHLORIDE 0.9 % IV SOLN: INTRAVENOUS | Status: DC | PRN

## 2016-01-29 MED ORDER — BISACODYL 10 MG RE SUPP: 10.0000 mg | Freq: Every day | RECTAL | Status: DC | PRN

## 2016-01-29 MED ORDER — ETOMIDATE 2 MG/ML IV SOLN
INTRAVENOUS | Status: AC | PRN
  Administered 2016-01-29: 20 mg via INTRAVENOUS

## 2016-01-29 MED ORDER — PROPOFOL 1000 MG/100ML IV EMUL: 0.0000 ug/kg/min | INTRAVENOUS | Status: DC

## 2016-01-29 MED ORDER — ACETAMINOPHEN 650 MG RE SUPP
650.0000 mg | RECTAL | Status: DC | PRN
  Administered 2016-01-29: 650 mg via RECTAL
  Filled 2016-01-29: qty 1

## 2016-01-29 MED ORDER — FENTANYL CITRATE (PF) 100 MCG/2ML IJ SOLN
100.0000 ug | INTRAMUSCULAR | Status: DC | PRN
  Administered 2016-01-30 – 2016-02-04 (×31): 100 ug via INTRAVENOUS
  Filled 2016-01-29 (×30): qty 2
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
## 2016-01-29 NOTE — ED Notes (Signed)
Foley site area appears okay.
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
## 2016-01-29 NOTE — ED Notes (Signed)
Pt to CT at 1320

## 2016-01-29 NOTE — Progress Notes (Signed)
Patient's temperature rising (101.2 F). Her heart rate is sustaining in the high 120s. 137mcg Fentanyl given without relief. Trauma paged. Order for acetaminophen 650mg  rectally given. Will continue to monitor. Thayer Ohm D

## 2016-01-29 NOTE — Progress Notes (Signed)
Patient transported from ED to room 123456 without complications.

## 2016-01-29 NOTE — ED Notes (Signed)
See trauma charting

## 2016-01-29 NOTE — Procedures (Signed)
Intubation Procedure Note Crawford Givens [id number] 09/09/1875  Procedure: Intubation Indications: Airway protection and maintenance  Procedure Details Consent: Unable to obtain consent because of altered level of consciousness. Time Out: Verified patient identification, verified procedure, site/side was marked, verified correct patient position, special equipment/implants available, medications/allergies/relevent history reviewed, required imaging and test results available.  Performed  Maximum sterile technique was used including gloves, gown, hand hygiene and mask.  MAC and 3    Evaluation Hemodynamic Status: BP stable throughout; O2 sats: transiently fell during during procedure Patient's Current Condition: stable Complications: No apparent complications Patient did tolerate procedure well. Chest X-ray ordered to verify placement.  CXR: pending.  Pt intubated using Glidescope # 3 Mac blade with 7.5 ett secured at 23, pulled back to 20 per MD post CXR. Pt spo2 dropped to 55% during intubation but quickly increased to 100% once ett secured and pt manually ventilated. Pt stable at this time. ETT verified with positive color change on etco2, bilateral BS, CXR unconfirmed but read at bedside by trauma MD. RT will continue to monitor.    Leanora Sans 01/29/2016

## 2016-01-29 NOTE — ED Notes (Signed)
Prior to Foley insertion, a tinge of blood was seen on the pt's meatus. Dr. Grandville Silos was informed and stated to continue with insertion of the Temp Foley. Millie - RN and Janett Billow - RN present and aware.

## 2016-01-30 ENCOUNTER — Inpatient Hospital Stay (HOSPITAL_COMMUNITY): Payer: No Typology Code available for payment source | Admitting: Certified Registered"

## 2016-01-30 ENCOUNTER — Inpatient Hospital Stay (HOSPITAL_COMMUNITY): Payer: No Typology Code available for payment source

## 2016-01-30 ENCOUNTER — Encounter (HOSPITAL_COMMUNITY): Admission: EM | Disposition: A | Discharge status: Home Health | Payer: Self-pay | Source: Home / Self Care

## 2016-01-30 ENCOUNTER — Encounter (HOSPITAL_COMMUNITY): Payer: Self-pay | Admitting: Certified Registered"

## 2016-01-30 DIAGNOSIS — S22009A Unspecified fracture of unspecified thoracic vertebra, initial encounter for closed fracture: Secondary | ICD-10-CM | POA: Diagnosis present

## 2016-01-30 HISTORY — PX: POSTERIOR LUMBAR FUSION 4 LEVEL: SHX6037

## 2016-01-30 LAB — CBC
HCT: 30.9 % — ABNORMAL LOW (ref 36.0–46.0)
Hemoglobin: 10 g/dL — ABNORMAL LOW (ref 12.0–15.0)
MCH: 29.1 pg (ref 26.0–34.0)
MCHC: 32.4 g/dL (ref 30.0–36.0)
MCV: 89.8 fL (ref 78.0–100.0)
PLATELETS: 141 10*3/uL — AB (ref 150–400)
RBC: 3.44 MIL/uL — ABNORMAL LOW (ref 3.87–5.11)
RDW: 13.6 % (ref 11.5–15.5)
WBC: 8.1 10*3/uL (ref 4.0–10.5)

## 2016-01-30 LAB — BLOOD GAS, ARTERIAL
ACID-BASE DEFICIT: 2.6 mmol/L — AB (ref 0.0–2.0)
Bicarbonate: 21.7 mEq/L (ref 20.0–24.0)
DRAWN BY: 36277
FIO2: 0.6
LHR: 15 {breaths}/min
O2 SAT: 96.8 %
PCO2 ART: 37.3 mmHg (ref 35.0–45.0)
PEEP: 8 cmH2O
PH ART: 7.383 (ref 7.350–7.450)
PO2 ART: 92.2 mmHg (ref 80.0–100.0)
Patient temperature: 98.6
TCO2: 22.9 mmol/L (ref 0–100)
VT: 0.56 mL

## 2016-01-30 LAB — COMPREHENSIVE METABOLIC PANEL
ALBUMIN: 2.7 g/dL — AB (ref 3.5–5.0)
ALT: 218 U/L — ABNORMAL HIGH (ref 14–54)
AST: 220 U/L — AB (ref 15–41)
Alkaline Phosphatase: 74 U/L (ref 38–126)
Anion gap: 6 (ref 5–15)
BUN: 9 mg/dL (ref 6–20)
CHLORIDE: 109 mmol/L (ref 101–111)
CO2: 23 mmol/L (ref 22–32)
Calcium: 7.9 mg/dL — ABNORMAL LOW (ref 8.9–10.3)
Creatinine, Ser: 0.68 mg/dL (ref 0.44–1.00)
GFR calc Af Amer: >60 mL/min (ref >60)
GLUCOSE: 113 mg/dL — AB (ref 65–99)
POTASSIUM: 3.8 mmol/L (ref 3.5–5.1)
SODIUM: 138 mmol/L (ref 135–145)
Total Bilirubin: 0.4 mg/dL (ref 0.3–1.2)
Total Protein: 4.7 g/dL — ABNORMAL LOW (ref 6.5–8.1)

## 2016-01-30 LAB — POCT I-STAT 7, (LYTES, BLD GAS, ICA,H+H)
Acid-base deficit: 7 mmol/L — ABNORMAL HIGH (ref 0.0–2.0)
Bicarbonate: 20.4 meq/L (ref 20.0–24.0)
Calcium, Ion: 1.16 mmol/L (ref 1.12–1.23)
HEMATOCRIT: 26 % — AB (ref 36.0–46.0)
Hemoglobin: 8.8 g/dL — ABNORMAL LOW (ref 12.0–15.0)
O2 SAT: 99 %
PCO2 ART: 48.3 mmHg — AB (ref 35.0–45.0)
PH ART: 7.234 — AB (ref 7.350–7.450)
PO2 ART: 157 mmHg — AB (ref 80.0–100.0)
Patient temperature: 37.1
Potassium: 4 mmol/L (ref 3.5–5.1)
Sodium: 138 mmol/L (ref 135–145)
TCO2: 22 mmol/L (ref 0–100)

## 2016-01-30 LAB — BLOOD PRODUCT ORDER (VERBAL) VERIFICATION

## 2016-01-30 LAB — TRIGLYCERIDES: Triglycerides: 68 mg/dL (ref <150)

## 2016-01-30 SURGERY — POSTERIOR LUMBAR FUSION 4 LEVEL
Anesthesia: General | Site: Back

## 2016-01-30 MED ORDER — SODIUM CHLORIDE 0.9 % IR SOLN
Status: DC | PRN
  Administered 2016-01-30: 19:00:00

## 2016-01-30 MED ORDER — FENTANYL CITRATE (PF) 250 MCG/5ML IJ SOLN
INTRAMUSCULAR | Status: DC | PRN
  Administered 2016-01-30: 50 ug via INTRAVENOUS
  Administered 2016-01-30: 100 ug via INTRAVENOUS
  Administered 2016-01-30 (×2): 50 ug via INTRAVENOUS
  Administered 2016-01-30: 100 ug via INTRAVENOUS

## 2016-01-30 MED ORDER — MENTHOL 3 MG MT LOZG: 1.0000 | LOZENGE | OROMUCOSAL | Status: DC | PRN

## 2016-01-30 MED ORDER — SODIUM CHLORIDE 0.9 % IV SOLN: 250.0000 mL | INTRAVENOUS | Status: DC

## 2016-01-30 MED ORDER — BUPIVACAINE LIPOSOME 1.3 % IJ SUSP
20.0000 mL | INTRAMUSCULAR | Status: AC
  Filled 2016-01-30: qty 20

## 2016-01-30 MED ORDER — SODIUM CHLORIDE 0.9% FLUSH: 3.0000 mL | INTRAVENOUS | Status: DC | PRN

## 2016-01-30 MED ORDER — LACTATED RINGERS IV SOLN
INTRAVENOUS | Status: DC | PRN
  Administered 2016-01-30: 20:00:00 via INTRAVENOUS

## 2016-01-30 MED ORDER — LIDOCAINE HCL (PF) 1 % IJ SOLN
INTRAMUSCULAR | Status: AC
  Administered 2016-01-30: 30 mL
  Filled 2016-01-30: qty 30

## 2016-01-30 MED ORDER — SODIUM CHLORIDE 0.9 % IV BOLUS (SEPSIS)
1000.0000 mL | Freq: Once | INTRAVENOUS | Status: AC
  Administered 2016-01-30: 1000 mL via INTRAVENOUS

## 2016-01-30 MED ORDER — THROMBIN 20000 UNITS EX SOLR
CUTANEOUS | Status: DC | PRN
  Administered 2016-01-30: 19:00:00 via TOPICAL

## 2016-01-30 MED ORDER — SODIUM CHLORIDE 0.9 % IJ SOLN
INTRAMUSCULAR | Status: AC
  Filled 2016-01-30: qty 10

## 2016-01-30 MED ORDER — FENTANYL CITRATE (PF) 250 MCG/5ML IJ SOLN
INTRAMUSCULAR | Status: AC
  Filled 2016-01-30: qty 5

## 2016-01-30 MED ORDER — VANCOMYCIN HCL 1000 MG IV SOLR
INTRAVENOUS | Status: AC
  Filled 2016-01-30: qty 1000

## 2016-01-30 MED ORDER — SODIUM CHLORIDE 0.9 % IV SOLN
1250.0000 mg | Freq: Two times a day (BID) | INTRAVENOUS | Status: AC
Start: 2016-01-31 — End: 2016-02-02
  Administered 2016-01-31 – 2016-02-02 (×6): 1250 mg via INTRAVENOUS
  Filled 2016-01-30 (×6): qty 1250

## 2016-01-30 MED ORDER — VECURONIUM BROMIDE 10 MG IV SOLR
INTRAVENOUS | Status: AC
  Filled 2016-01-30: qty 10

## 2016-01-30 MED ORDER — ESMOLOL HCL 100 MG/10ML IV SOLN
INTRAVENOUS | Status: DC | PRN
  Administered 2016-01-30 (×4): 10 mg via INTRAVENOUS

## 2016-01-30 MED ORDER — LIDOCAINE-EPINEPHRINE (PF) 1 %-1:200000 IJ SOLN
20.0000 mL | Freq: Once | INTRAMUSCULAR | Status: DC
  Filled 2016-01-30: qty 20

## 2016-01-30 MED ORDER — CEFAZOLIN SODIUM-DEXTROSE 2-4 GM/100ML-% IV SOLN: 2.0000 g | Freq: Three times a day (TID) | INTRAVENOUS | Status: DC

## 2016-01-30 MED ORDER — VECURONIUM BROMIDE 10 MG IV SOLR
INTRAVENOUS | Status: DC | PRN
  Administered 2016-01-30: 5 mg via INTRAVENOUS
  Administered 2016-01-30: 2 mg via INTRAVENOUS
  Administered 2016-01-30: 5 mg via INTRAVENOUS

## 2016-01-30 MED ORDER — VANCOMYCIN HCL IN DEXTROSE 1-5 GM/200ML-% IV SOLN
INTRAVENOUS | Status: AC
  Administered 2016-01-30: 1000 mg via INTRAVENOUS
  Filled 2016-01-30: qty 200

## 2016-01-30 MED ORDER — ESMOLOL HCL 100 MG/10ML IV SOLN
INTRAVENOUS | Status: AC
  Filled 2016-01-30: qty 10

## 2016-01-30 MED ORDER — 0.9 % SODIUM CHLORIDE (POUR BTL) OPTIME
TOPICAL | Status: DC | PRN
  Administered 2016-01-30: 1000 mL

## 2016-01-30 MED ORDER — FUROSEMIDE 10 MG/ML IJ SOLN
INTRAMUSCULAR | Status: AC
  Filled 2016-01-30: qty 4

## 2016-01-30 MED ORDER — VANCOMYCIN HCL 1000 MG IV SOLR
INTRAVENOUS | Status: DC | PRN
  Administered 2016-01-30: 1000 mg via TOPICAL

## 2016-01-30 MED ORDER — BUPIVACAINE LIPOSOME 1.3 % IJ SUSP
INTRAMUSCULAR | Status: DC | PRN
  Administered 2016-01-30: 20 mL

## 2016-01-30 MED ORDER — ACETAMINOPHEN 325 MG PO TABS
650.0000 mg | ORAL_TABLET | ORAL | Status: DC | PRN
  Administered 2016-02-03 (×2): 650 mg via ORAL
  Filled 2016-01-30 (×2): qty 2

## 2016-01-30 MED ORDER — PHENOL 1.4 % MT LIQD: 1.0000 | OROMUCOSAL | Status: DC | PRN

## 2016-01-30 MED ORDER — FUROSEMIDE 10 MG/ML IJ SOLN
INTRAMUSCULAR | Status: DC | PRN
  Administered 2016-01-30: 10 mg via INTRAMUSCULAR

## 2016-01-30 MED ORDER — SODIUM CHLORIDE 0.9% FLUSH
3.0000 mL | Freq: Two times a day (BID) | INTRAVENOUS | Status: DC
  Administered 2016-01-30 – 2016-02-06 (×13): 3 mL via INTRAVENOUS

## 2016-01-30 MED ORDER — ACETAMINOPHEN 650 MG RE SUPP: 650.0000 mg | RECTAL | Status: DC | PRN

## 2016-01-30 MED ORDER — ALBUMIN HUMAN 5 % IV SOLN
INTRAVENOUS | Status: DC | PRN
  Administered 2016-01-30: 20:00:00 via INTRAVENOUS

## 2016-01-30 MED ORDER — LIDOCAINE-EPINEPHRINE 1 %-1:100000 IJ SOLN
INTRAMUSCULAR | Status: DC | PRN
  Administered 2016-01-30: 10 mL

## 2016-01-30 MED ORDER — ONDANSETRON HCL 4 MG/2ML IJ SOLN: 4.0000 mg | INTRAMUSCULAR | Status: DC | PRN

## 2016-01-30 SURGICAL SUPPLY — 72 items
APL SKNCLS STERI-STRIP NONHPOA (GAUZE/BANDAGES/DRESSINGS) ×1
BAG DECANTER FOR FLEXI CONT (MISCELLANEOUS) ×2 IMPLANT
BENZOIN TINCTURE PRP APPL 2/3 (GAUZE/BANDAGES/DRESSINGS) ×2 IMPLANT
BLADE CLIPPER SURG (BLADE) IMPLANT
BLADE SURG 11 STRL SS (BLADE) ×2 IMPLANT
BRUSH SCRUB EZ PLAIN DRY (MISCELLANEOUS) ×2 IMPLANT
BUR MATCHSTICK NEURO 3.0 LAGG (BURR) ×2 IMPLANT
BUR PRECISION FLUTE 6.0 (BURR) ×2 IMPLANT
CANISTER SUCT 3000ML PPV (MISCELLANEOUS) ×2 IMPLANT
CAP LOCKING THREADED (Cap) ×10 IMPLANT
CONT SPEC 4OZ CLIKSEAL STRL BL (MISCELLANEOUS) ×2 IMPLANT
COVER BACK TABLE 60X90IN (DRAPES) ×2 IMPLANT
DECANTER SPIKE VIAL GLASS SM (MISCELLANEOUS) ×2 IMPLANT
DRAPE C-ARM 42X72 X-RAY (DRAPES) ×2 IMPLANT
DRAPE C-ARMOR (DRAPES) ×2 IMPLANT
DRAPE LAPAROTOMY 100X72X124 (DRAPES) ×2 IMPLANT
DRAPE POUCH INSTRU U-SHP 10X18 (DRAPES) ×2 IMPLANT
DRAPE PROXIMA HALF (DRAPES) IMPLANT
DRAPE SURG 17X23 STRL (DRAPES) ×2 IMPLANT
DRSG OPSITE 4X5.5 SM (GAUZE/BANDAGES/DRESSINGS) ×1 IMPLANT
DRSG OPSITE POSTOP 4X10 (GAUZE/BANDAGES/DRESSINGS) ×1 IMPLANT
DRSG OPSITE POSTOP 4X8 (GAUZE/BANDAGES/DRESSINGS) ×1 IMPLANT
DURAPREP 26ML APPLICATOR (WOUND CARE) ×2 IMPLANT
ELECT REM PT RETURN 9FT ADLT (ELECTROSURGICAL) ×2
ELECTRODE REM PT RTRN 9FT ADLT (ELECTROSURGICAL) ×1 IMPLANT
EVACUATOR 3/16  PVC DRAIN (DRAIN) ×1
EVACUATOR 3/16 PVC DRAIN (DRAIN) ×1 IMPLANT
GAUZE SPONGE 4X4 12PLY STRL (GAUZE/BANDAGES/DRESSINGS) ×2 IMPLANT
GAUZE SPONGE 4X4 16PLY XRAY LF (GAUZE/BANDAGES/DRESSINGS) ×2 IMPLANT
GLOVE BIO SURGEON STRL SZ 6.5 (GLOVE) ×2 IMPLANT
GLOVE BIO SURGEON STRL SZ7 (GLOVE) ×1 IMPLANT
GLOVE BIO SURGEON STRL SZ8 (GLOVE) ×4 IMPLANT
GLOVE EXAM NITRILE LRG STRL (GLOVE) IMPLANT
GLOVE EXAM NITRILE MD LF STRL (GLOVE) IMPLANT
GLOVE EXAM NITRILE XL STR (GLOVE) IMPLANT
GLOVE EXAM NITRILE XS STR PU (GLOVE) IMPLANT
GLOVE INDICATOR 8.5 STRL (GLOVE) ×4 IMPLANT
GOWN STRL REUS W/ TWL LRG LVL3 (GOWN DISPOSABLE) IMPLANT
GOWN STRL REUS W/ TWL XL LVL3 (GOWN DISPOSABLE) ×2 IMPLANT
GOWN STRL REUS W/TWL 2XL LVL3 (GOWN DISPOSABLE) IMPLANT
GOWN STRL REUS W/TWL LRG LVL3 (GOWN DISPOSABLE) ×2
GOWN STRL REUS W/TWL XL LVL3 (GOWN DISPOSABLE) ×4
KIT BASIN OR (CUSTOM PROCEDURE TRAY) ×2 IMPLANT
KIT ROOM TURNOVER OR (KITS) ×2 IMPLANT
LIQUID BAND (GAUZE/BANDAGES/DRESSINGS) ×2 IMPLANT
MIX DBX 10CC 35% BONE (Bone Implant) ×2 IMPLANT
NDL HYPO 21X1.5 SAFETY (NEEDLE) ×1 IMPLANT
NDL HYPO 25X1 1.5 SAFETY (NEEDLE) ×1 IMPLANT
NEEDLE HYPO 21X1.5 SAFETY (NEEDLE) ×2 IMPLANT
NEEDLE HYPO 25X1 1.5 SAFETY (NEEDLE) ×2 IMPLANT
NS IRRIG 1000ML POUR BTL (IV SOLUTION) ×2 IMPLANT
PACK LAMINECTOMY NEURO (CUSTOM PROCEDURE TRAY) ×2 IMPLANT
PAD ARMBOARD 7.5X6 YLW CONV (MISCELLANEOUS) ×6 IMPLANT
ROD REVERE 300MM STRAIGHT (Rod) ×1 IMPLANT
SCREW AMP MODULAR CREO 6.5X45 (Screw) ×4 IMPLANT
SCREW CREO 6.5X40 (Screw) ×6 IMPLANT
SCREW PA THRD CREO TULIP 5.5X4 (Head) ×10 IMPLANT
SPONGE LAP 4X18 X RAY DECT (DISPOSABLE) IMPLANT
SPONGE SURGIFOAM ABS GEL 100 (HEMOSTASIS) ×2 IMPLANT
STRIP BIOACTIVE 20CC 25X100X8 (Miscellaneous) ×1 IMPLANT
STRIP CLOSURE SKIN 1/2X4 (GAUZE/BANDAGES/DRESSINGS) ×4 IMPLANT
STRIP SURGICAL 1/2 X 6 IN (GAUZE/BANDAGES/DRESSINGS) ×2 IMPLANT
STRIP VITOSS 25X100X4MM (Neuro Prosthesis/Implant) ×2 IMPLANT
SUT VIC AB 0 CT1 18XCR BRD8 (SUTURE) ×2 IMPLANT
SUT VIC AB 0 CT1 8-18 (SUTURE) ×6
SUT VIC AB 2-0 CT1 18 (SUTURE) ×4 IMPLANT
SUT VIC AB 4-0 PS2 27 (SUTURE) ×2 IMPLANT
SYR 20CC LL (SYRINGE) ×2 IMPLANT
TOWEL OR 17X24 6PK STRL BLUE (TOWEL DISPOSABLE) ×2 IMPLANT
TOWEL OR 17X26 10 PK STRL BLUE (TOWEL DISPOSABLE) ×2 IMPLANT
TRAY FOLEY W/METER SILVER 14FR (SET/KITS/TRAYS/PACK) ×2 IMPLANT
WATER STERILE IRR 1000ML POUR (IV SOLUTION) ×2 IMPLANT
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
## 2016-01-30 NOTE — Procedures (Signed)
Arterial Catheter Insertion Procedure Note Barbara Rogers [id number] 23-Jun-1981  Procedure: Insertion of Arterial Catheter  Indications: Blood pressure monitoring and Frequent blood sampling  Procedure Details Consent: Unable to obtain consent because of intubated/sedated. . Time Out: Verified patient identification, verified procedure, site/side was marked, verified correct patient position, special equipment/implants available, medications/allergies/relevent history reviewed, required imaging and test results available.  Performed  Maximum sterile technique was used including antiseptics, cap, gloves, gown, hand hygiene, mask and sheet. Skin prep: Chlorhexidine;  20 gauge catheter was inserted into left radial artery using the Seldinger technique.  Evaluation Blood flow good; BP tracing good. Complications: No apparent complications   Aline inserted per MD order. No complications. Initial BP of 135/57.    Barbara Rogers [facility] 01/30/2016

## 2016-01-30 NOTE — Consult Note (Signed)
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 along    Jari Pigg, PA-C [facility] [phone] (P) 01/30/2016, 12:30 PM

## 2016-01-30 NOTE — Anesthesia Postprocedure Evaluation (Signed)
Anesthesia Post Note  Patient: Barbara Rogers  Procedure(s) Performed: Procedure(s) (LRB): T9-L2 Posterior Stabilization, Posterior Lumbar Fusion with Pedicle Screws (N/A)  Patient location during evaluation: ICU Anesthesia Type: General Level of consciousness: patient remains intubated per anesthesia plan Vital Signs Assessment: post-procedure vital signs reviewed and stable Respiratory status: patient remains intubated per anesthesia plan Cardiovascular status: stable Anesthetic complications: no    Last Vitals:  Filed Vitals:   01/30/16 2157 01/30/16 2200  BP: 108/78 106/75  Pulse: 110 109  Temp: 37.4 C 37.4 C  Resp: 18 16    Last Pain: There were no vitals filed for this visit.               Effie Berkshire
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
## 2016-01-30 NOTE — Progress Notes (Signed)
Patient ID: Barbara Rogers, female   DOB: 1981-04-13, 35 y.o.   MRN: [id number] Patient is on propofol still sedated however is arousable does respond appropriately and does appear to understand what I'm saying.  Neurologically she appears stable moving extremities well  Plan surgical stabilization later on this evening I have introduced this constipation however I will return at lunch and we will turn off the propofol and try to explain to her the procedure with her being lighter on sedation. We plan a T9 to L2 pack a screw and posterior lateral fusion.
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
## 2016-01-30 NOTE — Progress Notes (Signed)
Propofol turned off for 20 minutes prior to Dr. Saintclair Halsted discussing back surgery. She was able to nod appropriately to all questions asked and understood risks of surgery. Barbara Rogers at bedside to witness surgical procedure discussion. Patient agreed to surgery.

## 2016-01-30 NOTE — Progress Notes (Signed)
Patient ID: Barbara Rogers, female   DOB: 1981-07-09, 35 y.o.   MRN: [id number] Patient remained sedated however we turned off the propofol was off for about 10-15 minutes then the nurse and I discussed the procedure with the patient she nodded appropriately when prompted we gave her a riding board and she did write down the word numb to explain numbness that she is experiencing in the front of her neck and chest. She denies any numbness or tingling or pain into her legs. I explained the risks and benefits of a T9-L2 possibly L3 posterior spinal fixation with screws rods and implants. We went over the risks and benefits of the operation as well as alternatives. She understands and nodded understanding. We will proceed forward this afternoon. We turned on the sedation after the consultation.

## 2016-01-30 NOTE — Progress Notes (Signed)
[facility] radiology called with reports of patients chest xray: 30-40% pneumothorax. Dr. Grandville Silos made aware and prepared to place chest tube.

## 2016-01-30 NOTE — Procedures (Signed)
Chest Tube Insertion Procedure Note  Pre-operative Diagnosis: R Pneumothorax  Post-operative Diagnosis:  R Pneumothorax  Procedure Details  Emergency consent. I did also discuss with her fiancee just prior and he gave permission. Barbara Rogers has no family members in her life otherwise.  She was given IV pain medication and sedation. After sterile skin prep, using standard technique, a 20 French tube was placed in the R anterior axillary line, above the nipple level.  Findings: Rush of air  Estimated Blood Loss:  100cc         Specimens:  None              Complications:  None; patient tolerated the procedure well.         Disposition: ICU - intubated and critically ill.         Condition: stable  Georganna Skeans, MD, MPH, FACS Trauma: [phone] General Surgery: [phone]
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
## 2016-01-30 NOTE — Transfer of Care (Signed)
Immediate Anesthesia Transfer of Care Note  Patient: Barbara Rogers  Procedure(s) Performed: Procedure(s): T9-L2 Posterior Stabilization, Posterior Lumbar Fusion with Pedicle Screws (N/A)  Patient Location: PACU  Anesthesia Type:General  Level of Consciousness: sedated and unresponsive  Airway & Oxygen Therapy: Patient remains intubated per anesthesia plan and Patient placed on Ventilator (see vital sign flow sheet for setting)  Post-op Assessment: Report given to RN  Post vital signs: Reviewed and stable  Last Vitals:  Filed Vitals:   01/30/16 1700 01/30/16 1800  BP: 116/78 123/66  Pulse: 111 111  Temp: 38.3 C 38.3 C  Resp: 21 22    Last Pain: There were no vitals filed for this visit.       Complications: No apparent anesthesia complications

## 2016-01-31 ENCOUNTER — Encounter (HOSPITAL_COMMUNITY): Payer: Self-pay | Admitting: Neurosurgery

## 2016-01-31 ENCOUNTER — Inpatient Hospital Stay (HOSPITAL_COMMUNITY): Payer: No Typology Code available for payment source

## 2016-01-31 LAB — CBC
HEMATOCRIT: 23.8 % — AB (ref 36.0–46.0)
HEMOGLOBIN: 7.8 g/dL — AB (ref 12.0–15.0)
MCH: 28.5 pg (ref 26.0–34.0)
MCHC: 32.8 g/dL (ref 30.0–36.0)
MCV: 86.9 fL (ref 78.0–100.0)
Platelets: 132 10*3/uL — ABNORMAL LOW (ref 150–400)
RBC: 2.74 MIL/uL — AB (ref 3.87–5.11)
RDW: 13.7 % (ref 11.5–15.5)
WBC: 10.8 10*3/uL — AB (ref 4.0–10.5)

## 2016-01-31 LAB — BASIC METABOLIC PANEL
ANION GAP: 7 (ref 5–15)
CHLORIDE: 111 mmol/L (ref 101–111)
CO2: 20 mmol/L — ABNORMAL LOW (ref 22–32)
Calcium: 7.7 mg/dL — ABNORMAL LOW (ref 8.9–10.3)
Creatinine, Ser: 0.62 mg/dL (ref 0.44–1.00)
GFR calc Af Amer: >60 mL/min (ref >60)
GFR calc non Af Amer: >60 mL/min (ref >60)
GLUCOSE: 123 mg/dL — AB (ref 65–99)
POTASSIUM: 3.8 mmol/L (ref 3.5–5.1)
Sodium: 138 mmol/L (ref 135–145)

## 2016-01-31 LAB — BLOOD GAS, ARTERIAL
ACID-BASE DEFICIT: 4.8 mmol/L — AB (ref 0.0–2.0)
Bicarbonate: 19.2 mEq/L — ABNORMAL LOW (ref 20.0–24.0)
DRAWN BY: 40415
FIO2: 0.5
O2 SAT: 99.1 %
PCO2 ART: 31.7 mmHg — AB (ref 35.0–45.0)
PEEP/CPAP: 8 cmH2O
PH ART: 7.399 (ref 7.350–7.450)
Patient temperature: 100.4
RATE: 15 resp/min
TCO2: 20.2 mmol/L (ref 0–100)
VT: 560 mL
pO2, Arterial: 152 mmHg — ABNORMAL HIGH (ref 80.0–100.0)

## 2016-01-31 MED ORDER — FUROSEMIDE 10 MG/ML IJ SOLN
40.0000 mg | Freq: Once | INTRAMUSCULAR | Status: AC
  Administered 2016-01-31: 40 mg via INTRAVENOUS
  Filled 2016-01-31: qty 4

## 2016-01-31 MED FILL — Heparin Sodium (Porcine) Inj 1000 Unit/ML: INTRAMUSCULAR | Qty: 30 | Status: AC

## 2016-01-31 MED FILL — Sodium Chloride IV Soln 0.9%: INTRAVENOUS | Qty: 1000 | Status: AC

## 2016-01-31 NOTE — Progress Notes (Signed)
Follow up - Trauma and Critical Care  Patient Details:    Barbara Rogers is an 35 y.o. female.  Lines/tubes : Airway 7.5 mm (Active)  Secured at (cm) 20 cm 01/31/2016  9:00 AM  Measured From Lips 01/31/2016  9:00 AM  Secured Location Right 01/31/2016  9:00 AM  Secured By [employer] 01/31/2016  9:00 AM  Tube Holder Repositioned Yes 01/31/2016  9:00 AM  Site Condition Dry 01/31/2016  9:00 AM     Arterial Line 01/30/16 Left Radial (Active)  Site Assessment Clean;Dry;Intact 01/31/2016  8:00 AM  Line Status Pulsatile blood flow 01/31/2016  8:00 AM  Art Line Waveform Appropriate 01/31/2016  8:00 AM  Art Line Interventions Zeroed and calibrated;Leveled 01/31/2016  8:00 AM  Color/Movement/Sensation Capillary refill less than 3 sec 01/31/2016  8:00 AM  Dressing Type Transparent 01/31/2016  8:00 AM  Dressing Status Clean;Dry;Intact 01/31/2016  8:00 AM  Interventions New dressing 01/30/2016  2:00 PM  Dressing Change Due 02/03/16 01/30/2016 10:00 PM     Chest Tube 1 Right Pleural (Active)  Suction -20 cm H2O 01/31/2016  8:00 AM  Chest Tube Air Leak None 01/31/2016  8:00 AM  Drainage Description Bright red 01/31/2016  8:00 AM  Dressing Status Clean;Dry;Intact 01/31/2016  8:00 AM  Dressing Intervention New dressing 01/30/2016  2:00 PM  Site Assessment Clean;Dry;Intact 01/31/2016  8:00 AM  Surrounding Skin Dry;Intact 01/31/2016  8:00 AM  Output (mL) 140 mL 01/30/2016  5:00 PM     Closed System Drain 1 Posterior Back Accordion (Hemovac) 15 Fr. (Active)  Site Description Unable to view 01/31/2016  8:00 AM  Dressing Status Clean;Dry 01/31/2016  8:00 AM  Drainage Appearance Bloody 01/31/2016  8:00 AM  Status To suction (Charged) 01/31/2016  8:00 AM  Output (mL) 120 mL 01/31/2016  8:00 AM     NG/OG Tube Orogastric 18 Fr. Center mouth (Active)  Placement Verification Auscultation 01/31/2016  8:00 AM  Site Assessment Clean;Dry;Intact 01/31/2016  8:00 AM  Status Suction-low intermittent 01/31/2016  8:00 AM   Drainage Appearance Brown 01/30/2016 10:00 PM     Urethral Catheter Tanya T. - EMT Temperature probe 14 Fr. (Active)  Indication for Insertion or Continuance of Catheter Unstable critical patients (first 24-48 hours);Peri-operative use for selective surgical procedure 01/31/2016  8:00 AM  Site Assessment Clean;Intact 01/31/2016  8:00 AM  Catheter Maintenance Bag below level of bladder;Catheter secured;Drainage bag/tubing not touching floor;Insertion date on drainage bag;No dependent loops;Seal intact;Bag emptied prior to transport 01/31/2016  8:00 AM  Collection Container Standard drainage bag 01/31/2016  8:00 AM  Securement Method Securing device (Describe) 01/31/2016  8:00 AM  Urinary Catheter Interventions Unclamped 01/31/2016  8:00 AM  Output (mL) 150 mL 01/31/2016  3:00 AM    Microbiology/Sepsis markers: Results for orders placed or performed during the hospital encounter of 01/29/16  MRSA PCR Screening     Status: None   Collection Time: 01/29/16  4:41 PM  Result Value Ref Range Status   MRSA by PCR NEGATIVE NEGATIVE Final    Comment:        The GeneXpert MRSA Assay (FDA approved for NASAL specimens only), is one component of a comprehensive MRSA colonization surveillance program. It is not intended to diagnose MRSA infection nor to guide or monitor treatment for MRSA infections.     Anti-infectives:  Anti-infectives    Start     Dose/Rate Route Frequency Ordered Stop   01/31/16 0600  vancomycin (VANCOCIN) 1,250 mg in sodium chloride 0.9 % 250  mL IVPB     1,250 mg 166.7 mL/hr over 90 Minutes Intravenous Every 12 hours 01/30/16 2247 02/03/16 0559   01/30/16 2215  ceFAZolin (ANCEF) IVPB 2g/100 mL premix  Status:  Discontinued     2 g 200 mL/hr over 30 Minutes Intravenous Every 8 hours 01/30/16 2200 01/30/16 2233   01/30/16 1859  vancomycin (VANCOCIN) powder  Status:  Discontinued       As needed 01/30/16 1859 01/30/16 2129   01/30/16 1858  bacitracin 50,000 Units in sodium  chloride irrigation 0.9 % 500 mL irrigation  Status:  Discontinued       As needed 01/30/16 1858 01/30/16 2129   01/30/16 1839  vancomycin (VANCOCIN) 1-5 GM/200ML-% IVPB    Comments:  Loreli Dollar   : cabinet override      01/30/16 1839 01/30/16 1927   01/30/16 1742  vancomycin (VANCOCIN) 1000 MG powder    Comments:  Loreli Dollar   : cabinet override      01/30/16 1742 01/31/16 0544      Best Practice/Protocols:  VTE Prophylaxis: Mechanical GI Prophylaxis: Proton Pump Inhibitor Continous Sedation  Consults: Treatment Team:  Kary Kos, MD [place] , MD    Events:  Subjective:    Overnight Issues: Patient is sedated and on the ventilator.  Objective:  Vital signs for last 24 hours: Temp:  [99.3 F (37.4 C)-100.9 F (38.3 C)] 99.9 F (37.7 C) (05/24 1000) Pulse Rate:  [104-119] 110 (05/24 1000) Resp:  [16-30] 20 (05/24 1000) BP: (101-151)/(66-94) 116/77 mmHg (05/24 1000) SpO2:  [94 %-100 %] 98 % (05/24 1000) Arterial Line BP: (116-159)/(60-80) 134/71 mmHg (05/24 1000) FiO2 (%):  [40 %-100 %] 40 % (05/24 1000)  Hemodynamic parameters for last 24 hours:    Intake/Output from previous day: 05/23 0701 - 05/24 0700 In: 4844.2 [I.V.:4344.2; IV Piggyback:500] Out: 2100 [Urine:1595; Drains:165; Blood:200; Chest Tube:140]  Intake/Output this shift: Total I/O In: 397.2 [I.V.:397.2] Out: 120 [Drains:120]  Vent settings for last 24 hours: Vent Mode:  [-] PRVC FiO2 (%):  [40 %-100 %] 40 % Set Rate:  [15 bmp] 15 bmp Vt Set:  [560 mL] 560 mL PEEP:  [8 cmH20] 8 cmH20 Plateau Pressure:  [19 cmH20-22 cmH20] 19 cmH20  Physical Exam:  General: no respiratory distress and not overbreathing the ventilator Neuro: nonfocal exam and RASS -1 Resp: clear to auscultation bilaterally CVS: regular rate and rhythm, S1, S2 normal, no murmur, click, rub or gallop GI: soft, nontender, BS WNL, no r/g Extremities: no edema, no erythema, pulses WNL  Results for orders placed or  performed during the hospital encounter of 01/29/16 (from the past 24 hour(s))  Provider-confirm verbal Blood Bank order - Type & Screen, RBC, FFP; 4 Units; Order taken: 01/29/2016; 12:40 PM; Level 1 Trauma, Emergency Release     Status: None   Collection Time: 01/30/16  5:07 PM  Result Value Ref Range   Blood product order confirm MD AUTHORIZATION REQUESTED   I-STAT 7, (LYTES, BLD GAS, ICA, H+H)     Status: Abnormal   Collection Time: 01/30/16  8:36 PM  Result Value Ref Range   pH, Arterial 7.234 (L) 7.350 - 7.450   pCO2 arterial 48.3 (H) 35.0 - 45.0 mmHg   pO2, Arterial 157.0 (H) 80.0 - 100.0 mmHg   Bicarbonate 20.4 20.0 - 24.0 mEq/L   TCO2 22 0 - 100 mmol/L   O2 Saturation 99.0 %   Acid-base deficit 7.0 (H) 0.0 - 2.0 mmol/L   Sodium 138 135 -  145 mmol/L   Potassium 4.0 3.5 - 5.1 mmol/L   Calcium, Ion 1.16 1.12 - 1.23 mmol/L   HCT 26.0 (L) 36.0 - 46.0 %   Hemoglobin 8.8 (L) 12.0 - 15.0 g/dL   Patient temperature 37.1 C    Sample type ARTERIAL   Blood gas, arterial     Status: Abnormal   Collection Time: 01/31/16  4:05 AM  Result Value Ref Range   FIO2 0.50    Delivery systems VENTILATOR    Mode PRESSURE REGULATED VOLUME CONTROL    VT 560 mL   LHR 15 resp/min   Peep/cpap 8.0 cm H20   pH, Arterial 7.399 7.350 - 7.450   pCO2 arterial 31.7 (L) 35.0 - 45.0 mmHg   pO2, Arterial 152 (H) 80.0 - 100.0 mmHg   Bicarbonate 19.2 (L) 20.0 - 24.0 mEq/L   TCO2 20.2 0 - 100 mmol/L   Acid-base deficit 4.8 (H) 0.0 - 2.0 mmol/L   O2 Saturation 99.1 %   Patient temperature 100.4    Collection site A-LINE    Drawn by [id number]    Sample type ARTERIAL   CBC     Status: Abnormal   Collection Time: 01/31/16  5:10 AM  Result Value Ref Range   WBC 10.8 (H) 4.0 - 10.5 K/uL   RBC 2.74 (L) 3.87 - 5.11 MIL/uL   Hemoglobin 7.8 (L) 12.0 - 15.0 g/dL   HCT 23.8 (L) 36.0 - 46.0 %   MCV 86.9 78.0 - 100.0 fL   MCH 28.5 26.0 - 34.0 pg   MCHC 32.8 30.0 - 36.0 g/dL   RDW 13.7 11.5 - 15.5 %   Platelets 132  (L) 150 - 400 K/uL  Basic metabolic panel     Status: Abnormal   Collection Time: 01/31/16  5:10 AM  Result Value Ref Range   Sodium 138 135 - 145 mmol/L   Potassium 3.8 3.5 - 5.1 mmol/L   Chloride 111 101 - 111 mmol/L   CO2 20 (L) 22 - 32 mmol/L   Glucose, Bld 123 (H) 65 - 99 mg/dL   BUN <5 (L) 6 - 20 mg/dL   Creatinine, Ser 0.62 0.44 - 1.00 mg/dL   Calcium 7.7 (L) 8.9 - 10.3 mg/dL   GFR calc non Af Amer >60 >60 mL/min   GFR calc Af Amer >60 >60 mL/min   Anion gap 7 5 - 15     Assessment/Plan:   NEURO  Altered Mental Status:  sedation and but follows commands.   Plan: Wean sedation as we move towards extubation.  PULM  Bialteral pneumothoraces, not enough to place CT on the left.  Has CT on the right   Plan: Wean to extubate.  No plan for an additional CT.  CARDIO  No significant issues   Plan: CPM  RENAL  No significant issues   Plan: May require Lasix  GI  No isses   Plan: CPM.  Hold tube feedings for extubation if possible  ID  No known infectious problems   Plan: CPM with prophylactic drain management with Vanco  HEME  Anemia acute blood loss anemia)   Plan: No blood for now.  ENDO No specific issues   Plan: CPM  Global Issues  Will wean the patient for extubation.  Will watch PTXs with repeat CXRs    LOS: 2 days   Additional comments:I reviewed the patient's new clinical lab test results. cbc/bmet and I reviewed the patients new imaging test results. cxr  Critical  Care Total Time*: 30 Minutes  Emunah Texidor 01/31/2016  *Care during the described time interval was provided by me and/or other providers on the critical care team.  I have reviewed this patient's available data, including medical history, events of note, physical examination and test results as part of my evaluation.

## 2016-01-31 NOTE — Progress Notes (Signed)
Radiology called to report that new chest xray shows a new Left pneumothorax that was not present on the last xray.  Called these results to Dr. Rosendo Gros. No chest tube placement planned at this time, MD told me to watch for signs of hypoxia, tachycardia, hypotension and report any new findings if they occur.   I will continue to monitor the patient closely for any signs of distress.  Barbara Rogers
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
## 2016-02-01 ENCOUNTER — Inpatient Hospital Stay (HOSPITAL_COMMUNITY): Payer: No Typology Code available for payment source

## 2016-02-01 LAB — CBC WITH DIFFERENTIAL/PLATELET
BASOS ABS: 0 10*3/uL (ref 0.0–0.1)
Basophils Relative: 0 %
EOS PCT: 1 %
Eosinophils Absolute: 0.1 10*3/uL (ref 0.0–0.7)
HEMATOCRIT: 19.9 % — AB (ref 36.0–46.0)
HEMOGLOBIN: 6.6 g/dL — AB (ref 12.0–15.0)
LYMPHS PCT: 10 %
Lymphs Abs: 1 10*3/uL (ref 0.7–4.0)
MCH: 29.1 pg (ref 26.0–34.0)
MCHC: 33.2 g/dL (ref 30.0–36.0)
MCV: 87.7 fL (ref 78.0–100.0)
MONO ABS: 0.8 10*3/uL (ref 0.1–1.0)
Monocytes Relative: 8 %
Neutro Abs: 8 10*3/uL — ABNORMAL HIGH (ref 1.7–7.7)
Neutrophils Relative %: 81 %
Platelets: 124 10*3/uL — ABNORMAL LOW (ref 150–400)
RBC: 2.27 MIL/uL — AB (ref 3.87–5.11)
RDW: 13.7 % (ref 11.5–15.5)
WBC: 9.9 10*3/uL (ref 4.0–10.5)

## 2016-02-01 LAB — MAGNESIUM
MAGNESIUM: 1.6 mg/dL — AB (ref 1.7–2.4)
MAGNESIUM: 1.6 mg/dL — AB (ref 1.7–2.4)

## 2016-02-01 LAB — CBC
HEMATOCRIT: 23.8 % — AB (ref 36.0–46.0)
Hemoglobin: 8.1 g/dL — ABNORMAL LOW (ref 12.0–15.0)
MCH: 29 pg (ref 26.0–34.0)
MCHC: 34 g/dL (ref 30.0–36.0)
MCV: 85.3 fL (ref 78.0–100.0)
Platelets: 118 10*3/uL — ABNORMAL LOW (ref 150–400)
RBC: 2.79 MIL/uL — ABNORMAL LOW (ref 3.87–5.11)
RDW: 13.3 % (ref 11.5–15.5)
WBC: 9 10*3/uL (ref 4.0–10.5)

## 2016-02-01 LAB — BASIC METABOLIC PANEL
ANION GAP: 5 (ref 5–15)
BUN: 7 mg/dL (ref 6–20)
CHLORIDE: 109 mmol/L (ref 101–111)
CO2: 23 mmol/L (ref 22–32)
Calcium: 7.9 mg/dL — ABNORMAL LOW (ref 8.9–10.3)
Creatinine, Ser: 0.47 mg/dL (ref 0.44–1.00)
GFR calc Af Amer: >60 mL/min (ref >60)
GFR calc non Af Amer: >60 mL/min (ref >60)
GLUCOSE: 126 mg/dL — AB (ref 65–99)
POTASSIUM: 3.4 mmol/L — AB (ref 3.5–5.1)
Sodium: 137 mmol/L (ref 135–145)

## 2016-02-01 LAB — GLUCOSE, CAPILLARY
GLUCOSE-CAPILLARY: 93 mg/dL (ref 65–99)
Glucose-Capillary: 85 mg/dL (ref 65–99)

## 2016-02-01 LAB — PHOSPHORUS
PHOSPHORUS: 1.9 mg/dL — AB (ref 2.5–4.6)
PHOSPHORUS: 1.7 mg/dL — AB (ref 2.5–4.6)

## 2016-02-01 LAB — PREPARE RBC (CROSSMATCH)

## 2016-02-01 MED ORDER — SELENIUM 50 MCG PO TABS
200.0000 ug | ORAL_TABLET | Freq: Every day | ORAL | Status: DC
  Administered 2016-02-01 – 2016-02-03 (×3): 200 ug
  Filled 2016-02-01 (×4): qty 4

## 2016-02-01 MED ORDER — PRO-STAT SUGAR FREE PO LIQD
30.0000 mL | Freq: Three times a day (TID) | ORAL | Status: DC
  Administered 2016-02-01 – 2016-02-03 (×8): 30 mL
  Filled 2016-02-01 (×8): qty 30

## 2016-02-01 MED ORDER — PRO-STAT SUGAR FREE PO LIQD: 30.0000 mL | Freq: Two times a day (BID) | ORAL | Status: DC

## 2016-02-01 MED ORDER — PIVOT 1.5 CAL PO LIQD
1000.0000 mL | ORAL | Status: DC
  Administered 2016-02-01 – 2016-02-03 (×3): 1000 mL

## 2016-02-01 MED ORDER — VITAL HIGH PROTEIN PO LIQD: 1000.0000 mL | ORAL | Status: DC

## 2016-02-01 MED ORDER — SODIUM CHLORIDE 0.9 % IV SOLN
Freq: Once | INTRAVENOUS | Status: AC
  Administered 2016-02-01: 08:00:00 via INTRAVENOUS

## 2016-02-01 MED ORDER — VITAMIN C 500 MG PO TABS
1000.0000 mg | ORAL_TABLET | Freq: Three times a day (TID) | ORAL | Status: DC
Start: 2016-02-01 — End: 2016-02-04
  Administered 2016-02-01 – 2016-02-04 (×9): 1000 mg
  Filled 2016-02-01 (×9): qty 2

## 2016-02-01 NOTE — Progress Notes (Signed)
CRITICAL VALUE ALERT  Critical value received:  HGB 6.6  Date of notification:  02/01/2016  Time of notification:  0630  Critical value read back:Yes.    Nurse who received alert:  Tawni Carnes, RN  MD notified (1st page):  Dr. Hulen Skains  Time of first page:  0645  MD notified (2nd page):  Time of second page:  Responding MD:  Dr. Hulen Skains  Time MD responded:  [phone]
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
## 2016-02-01 NOTE — Progress Notes (Signed)
ET tube advanced 2cm per MD order. Pt tolerated well, BBS heard. RT will continue to monitor.
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
## 2016-02-02 ENCOUNTER — Inpatient Hospital Stay (HOSPITAL_COMMUNITY): Payer: No Typology Code available for payment source

## 2016-02-02 DIAGNOSIS — J939 Pneumothorax, unspecified: Secondary | ICD-10-CM

## 2016-02-02 DIAGNOSIS — S2243XA Multiple fractures of ribs, bilateral, initial encounter for closed fracture: Secondary | ICD-10-CM

## 2016-02-02 LAB — TYPE AND SCREEN
ABO/RH(D): O POS
Antibody Screen: NEGATIVE
UNIT DIVISION: 0
UNIT DIVISION: 0
UNIT DIVISION: 0
Unit division: 0

## 2016-02-02 LAB — CBC
HCT: 24.3 % — ABNORMAL LOW (ref 36.0–46.0)
Hemoglobin: 8.1 g/dL — ABNORMAL LOW (ref 12.0–15.0)
MCH: 28.6 pg (ref 26.0–34.0)
MCHC: 33.3 g/dL (ref 30.0–36.0)
MCV: 85.9 fL (ref 78.0–100.0)
PLATELETS: 141 10*3/uL — AB (ref 150–400)
RBC: 2.83 MIL/uL — ABNORMAL LOW (ref 3.87–5.11)
RDW: 13.7 % (ref 11.5–15.5)
WBC: 7.9 10*3/uL (ref 4.0–10.5)

## 2016-02-02 LAB — BASIC METABOLIC PANEL
Anion gap: 7 (ref 5–15)
BUN: 6 mg/dL (ref 6–20)
CHLORIDE: 109 mmol/L (ref 101–111)
CO2: 24 mmol/L (ref 22–32)
CREATININE: 0.42 mg/dL — AB (ref 0.44–1.00)
Calcium: 7.8 mg/dL — ABNORMAL LOW (ref 8.9–10.3)
GFR calc Af Amer: >60 mL/min (ref >60)
GLUCOSE: 116 mg/dL — AB (ref 65–99)
POTASSIUM: 3.2 mmol/L — AB (ref 3.5–5.1)
SODIUM: 140 mmol/L (ref 135–145)

## 2016-02-02 LAB — MAGNESIUM
MAGNESIUM: 1.6 mg/dL — AB (ref 1.7–2.4)
MAGNESIUM: 1.5 mg/dL — AB (ref 1.7–2.4)

## 2016-02-02 LAB — BLOOD GAS, ARTERIAL
Acid-Base Excess: 0.2 mmol/L (ref 0.0–2.0)
Bicarbonate: 24 mEq/L (ref 20.0–24.0)
DRAWN BY: 24485
FIO2: 40
MECHVT: 560 mL
O2 SAT: 98.1 %
PATIENT TEMPERATURE: 98.6
PCO2 ART: 36.7 mmHg (ref 35.0–45.0)
PEEP: 5 cmH2O
PH ART: 7.432 (ref 7.350–7.450)
PO2 ART: 106 mmHg — AB (ref 80.0–100.0)
RATE: 15 resp/min
TCO2: 25.1 mmol/L (ref 0–100)

## 2016-02-02 LAB — GLUCOSE, CAPILLARY
GLUCOSE-CAPILLARY: 112 mg/dL — AB (ref 65–99)
GLUCOSE-CAPILLARY: 108 mg/dL — AB (ref 65–99)
Glucose-Capillary: 118 mg/dL — ABNORMAL HIGH (ref 65–99)
Glucose-Capillary: 123 mg/dL — ABNORMAL HIGH (ref 65–99)
Glucose-Capillary: 130 mg/dL — ABNORMAL HIGH (ref 65–99)
Glucose-Capillary: 162 mg/dL — ABNORMAL HIGH (ref 65–99)
Glucose-Capillary: 165 mg/dL — ABNORMAL HIGH (ref 65–99)

## 2016-02-02 LAB — PHOSPHORUS
PHOSPHORUS: 3 mg/dL (ref 2.5–4.6)
Phosphorus: 1.8 mg/dL — ABNORMAL LOW (ref 2.5–4.6)

## 2016-02-02 LAB — TRIGLYCERIDES: Triglycerides: 190 mg/dL — ABNORMAL HIGH (ref <150)

## 2016-02-02 MED ORDER — HYDROCODONE-ACETAMINOPHEN 10-325 MG PO TABS
1.0000 | ORAL_TABLET | Freq: Two times a day (BID) | ORAL | Status: DC
  Administered 2016-02-02 – 2016-02-03 (×3): 1 via ORAL
  Filled 2016-02-02 (×4): qty 1

## 2016-02-02 MED ORDER — FUROSEMIDE 10 MG/ML IJ SOLN
40.0000 mg | Freq: Once | INTRAMUSCULAR | Status: AC
  Administered 2016-02-02: 40 mg via INTRAVENOUS
  Filled 2016-02-02: qty 4

## 2016-02-02 MED ORDER — QUETIAPINE FUMARATE 200 MG PO TABS: 200.0000 mg | ORAL_TABLET | Freq: Every day | ORAL | Status: DC

## 2016-02-02 MED ORDER — POTASSIUM CHLORIDE 20 MEQ/15ML (10%) PO SOLN
20.0000 meq | Freq: Two times a day (BID) | ORAL | Status: DC
  Administered 2016-02-02 – 2016-02-05 (×6): 20 meq via ORAL
  Filled 2016-02-02 (×6): qty 15

## 2016-02-02 MED ORDER — PNEUMOCOCCAL VAC POLYVALENT 25 MCG/0.5ML IJ INJ
0.5000 mL | INJECTION | INTRAMUSCULAR | Status: AC
  Administered 2016-02-03: 0.5 mL via INTRAMUSCULAR
  Filled 2016-02-02: qty 0.5

## 2016-02-02 MED ORDER — SENNOSIDES 8.8 MG/5ML PO SYRP: 5.0000 mL | ORAL_SOLUTION | Freq: Two times a day (BID) | ORAL | Status: DC | PRN

## 2016-02-02 MED ORDER — ANTISEPTIC ORAL RINSE SOLUTION (CORINZ)
7.0000 mL | OROMUCOSAL | Status: DC
  Administered 2016-02-02 – 2016-02-04 (×22): 7 mL via OROMUCOSAL

## 2016-02-02 MED ORDER — LORAZEPAM 1 MG PO TABS
1.0000 mg | ORAL_TABLET | Freq: Four times a day (QID) | ORAL | Status: DC | PRN
  Administered 2016-02-02 – 2016-02-03 (×2): 1 mg via ORAL
  Filled 2016-02-02 (×2): qty 1

## 2016-02-02 MED ORDER — DEXMEDETOMIDINE HCL IN NACL 400 MCG/100ML IV SOLN
0.4000 ug/kg/h | INTRAVENOUS | Status: DC
  Administered 2016-02-02: 0.8 ug/kg/h via INTRAVENOUS
  Administered 2016-02-02: 1.8 ug/kg/h via INTRAVENOUS
  Administered 2016-02-02: 1.4 ug/kg/h via INTRAVENOUS
  Administered 2016-02-02 (×2): 1.8 ug/kg/h via INTRAVENOUS
  Administered 2016-02-02: 0.4 ug/kg/h via INTRAVENOUS
  Administered 2016-02-03: 1.3 ug/kg/h via INTRAVENOUS
  Administered 2016-02-03: 1.2 ug/kg/h via INTRAVENOUS
  Administered 2016-02-03 (×4): 1.8 ug/kg/h via INTRAVENOUS
  Administered 2016-02-03: 1.2 ug/kg/h via INTRAVENOUS
  Administered 2016-02-04: 1.5 ug/kg/h via INTRAVENOUS
  Administered 2016-02-04 (×2): 1.8 ug/kg/h via INTRAVENOUS
  Filled 2016-02-02 (×4): qty 100
  Filled 2016-02-02: qty 50
  Filled 2016-02-02: qty 100
  Filled 2016-02-02: qty 50
  Filled 2016-02-02 (×2): qty 100
  Filled 2016-02-02: qty 50
  Filled 2016-02-02: qty 100
  Filled 2016-02-02: qty 50
  Filled 2016-02-02 (×3): qty 100
  Filled 2016-02-02: qty 50
  Filled 2016-02-02 (×2): qty 100
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
## 2016-02-02 NOTE — Progress Notes (Signed)
[facility] Progress Note  Pt remains intubated Repeat clavicle films on Monday    Jari Pigg, PA-C [facility] [phone] (P) 02/02/2016 10:38 AM
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
## 2016-02-03 ENCOUNTER — Inpatient Hospital Stay (HOSPITAL_COMMUNITY): Payer: No Typology Code available for payment source

## 2016-02-03 DIAGNOSIS — S2243XA Multiple fractures of ribs, bilateral, initial encounter for closed fracture: Secondary | ICD-10-CM

## 2016-02-03 DIAGNOSIS — S12201A Unspecified nondisplaced fracture of third cervical vertebra, initial encounter for closed fracture: Secondary | ICD-10-CM

## 2016-02-03 DIAGNOSIS — J939 Pneumothorax, unspecified: Secondary | ICD-10-CM

## 2016-02-03 DIAGNOSIS — J9601 Acute respiratory failure with hypoxia: Secondary | ICD-10-CM

## 2016-02-03 DIAGNOSIS — S27321D Contusion of lung, unilateral, subsequent encounter: Secondary | ICD-10-CM

## 2016-02-03 LAB — CBC WITH DIFFERENTIAL/PLATELET
Basophils Absolute: 0 10*3/uL (ref 0.0–0.1)
Basophils Relative: 0 %
Eosinophils Absolute: 0.4 10*3/uL (ref 0.0–0.7)
Eosinophils Relative: 5 %
HCT: 25.3 % — ABNORMAL LOW (ref 36.0–46.0)
HEMOGLOBIN: 8.5 g/dL — AB (ref 12.0–15.0)
LYMPHS ABS: 1.6 10*3/uL (ref 0.7–4.0)
LYMPHS PCT: 20 %
MCH: 29.5 pg (ref 26.0–34.0)
MCHC: 33.6 g/dL (ref 30.0–36.0)
MCV: 87.8 fL (ref 78.0–100.0)
MONO ABS: 0.7 10*3/uL (ref 0.1–1.0)
MONOS PCT: 8 %
NEUTROS ABS: 5.7 10*3/uL (ref 1.7–7.7)
Neutrophils Relative %: 67 %
PLATELETS: 185 10*3/uL (ref 150–400)
RBC: 2.88 MIL/uL — ABNORMAL LOW (ref 3.87–5.11)
RDW: 13.8 % (ref 11.5–15.5)
WBC: 8.3 10*3/uL (ref 4.0–10.5)

## 2016-02-03 LAB — GLUCOSE, CAPILLARY
GLUCOSE-CAPILLARY: 126 mg/dL — AB (ref 65–99)
Glucose-Capillary: 114 mg/dL — ABNORMAL HIGH (ref 65–99)
Glucose-Capillary: 126 mg/dL — ABNORMAL HIGH (ref 65–99)
Glucose-Capillary: 133 mg/dL — ABNORMAL HIGH (ref 65–99)
Glucose-Capillary: 135 mg/dL — ABNORMAL HIGH (ref 65–99)
Glucose-Capillary: 151 mg/dL — ABNORMAL HIGH (ref 65–99)

## 2016-02-03 LAB — BASIC METABOLIC PANEL
ANION GAP: 7 (ref 5–15)
BUN: 9 mg/dL (ref 6–20)
CALCIUM: 8 mg/dL — AB (ref 8.9–10.3)
CHLORIDE: 105 mmol/L (ref 101–111)
CO2: 29 mmol/L (ref 22–32)
Creatinine, Ser: 0.49 mg/dL (ref 0.44–1.00)
GFR calc Af Amer: >60 mL/min (ref >60)
GFR calc non Af Amer: >60 mL/min (ref >60)
GLUCOSE: 137 mg/dL — AB (ref 65–99)
POTASSIUM: 3.1 mmol/L — AB (ref 3.5–5.1)
Sodium: 141 mmol/L (ref 135–145)

## 2016-02-03 MED ORDER — PREGABALIN 50 MG PO CAPS
100.0000 mg | ORAL_CAPSULE | Freq: Three times a day (TID) | ORAL | Status: DC
  Administered 2016-02-03 (×2): 100 mg via ORAL
  Filled 2016-02-03 (×3): qty 2

## 2016-02-03 MED ORDER — LORAZEPAM 2 MG/ML IJ SOLN
1.0000 mg | INTRAMUSCULAR | Status: DC | PRN
  Administered 2016-02-03 – 2016-02-07 (×19): 1 mg via INTRAVENOUS
  Filled 2016-02-03 (×18): qty 1

## 2016-02-03 MED ORDER — POTASSIUM CHLORIDE 20 MEQ/15ML (10%) PO SOLN
40.0000 meq | Freq: Once | ORAL | Status: AC
  Administered 2016-02-03: 40 meq via ORAL
  Filled 2016-02-03: qty 30

## 2016-02-03 MED ORDER — POTASSIUM CHLORIDE CRYS ER 20 MEQ PO TBCR: 40.0000 meq | EXTENDED_RELEASE_TABLET | Freq: Once | ORAL | Status: DC

## 2016-02-03 MED ORDER — LORAZEPAM 2 MG/ML IJ SOLN
INTRAMUSCULAR | Status: AC
  Filled 2016-02-03: qty 1

## 2016-02-03 MED ORDER — MAGNESIUM SULFATE 2 GM/50ML IV SOLN
2.0000 g | Freq: Once | INTRAVENOUS | Status: AC
  Administered 2016-02-03: 2 g via INTRAVENOUS
  Filled 2016-02-03: qty 50

## 2016-02-03 MED ORDER — QUETIAPINE FUMARATE 100 MG PO TABS
100.0000 mg | ORAL_TABLET | Freq: Two times a day (BID) | ORAL | Status: DC
  Administered 2016-02-03 (×2): 100 mg via ORAL
  Filled 2016-02-03 (×4): qty 1
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
## 2016-02-04 DIAGNOSIS — S129XXA Fracture of neck, unspecified, initial encounter: Secondary | ICD-10-CM

## 2016-02-04 LAB — BASIC METABOLIC PANEL
ANION GAP: 5 (ref 5–15)
BUN: 13 mg/dL (ref 6–20)
CHLORIDE: 109 mmol/L (ref 101–111)
CO2: 27 mmol/L (ref 22–32)
CREATININE: 0.46 mg/dL (ref 0.44–1.00)
Calcium: 8 mg/dL — ABNORMAL LOW (ref 8.9–10.3)
GFR calc non Af Amer: >60 mL/min (ref >60)
Glucose, Bld: 120 mg/dL — ABNORMAL HIGH (ref 65–99)
POTASSIUM: 3.9 mmol/L (ref 3.5–5.1)
SODIUM: 141 mmol/L (ref 135–145)

## 2016-02-04 LAB — GLUCOSE, CAPILLARY
GLUCOSE-CAPILLARY: 126 mg/dL — AB (ref 65–99)
GLUCOSE-CAPILLARY: 122 mg/dL — AB (ref 65–99)
GLUCOSE-CAPILLARY: 82 mg/dL (ref 65–99)
GLUCOSE-CAPILLARY: 86 mg/dL (ref 65–99)
Glucose-Capillary: 90 mg/dL (ref 65–99)
Glucose-Capillary: 92 mg/dL (ref 65–99)

## 2016-02-04 LAB — MAGNESIUM: MAGNESIUM: 1.6 mg/dL — AB (ref 1.7–2.4)

## 2016-02-04 MED ORDER — FENTANYL 40 MCG/ML IV SOLN
INTRAVENOUS | Status: DC
  Administered 2016-02-04: 18:00:00 via INTRAVENOUS
  Administered 2016-02-04: 240 ug via INTRAVENOUS
  Administered 2016-02-04: 300 ug via INTRAVENOUS
  Administered 2016-02-05: 225 ug via INTRAVENOUS
  Administered 2016-02-05: 300 ug via INTRAVENOUS
  Administered 2016-02-05: 225 ug via INTRAVENOUS
  Administered 2016-02-05: 195 ug via INTRAVENOUS
  Administered 2016-02-05: 210 ug via INTRAVENOUS
  Administered 2016-02-05: 40 ug via INTRAVENOUS
  Administered 2016-02-05: 21:00:00 via INTRAVENOUS
  Administered 2016-02-06: 165 ug via INTRAVENOUS
  Administered 2016-02-06: 210 ug via INTRAVENOUS
  Administered 2016-02-06: 150 ug via INTRAVENOUS
  Administered 2016-02-06: 270 ug via INTRAVENOUS
  Administered 2016-02-06: 90 ug via INTRAVENOUS
  Administered 2016-02-06: 19:00:00 via INTRAVENOUS
  Administered 2016-02-06: 150 ug via INTRAVENOUS
  Administered 2016-02-07: 120 ug via INTRAVENOUS
  Administered 2016-02-07: 285 ug via INTRAVENOUS
  Administered 2016-02-07: 150 ug via INTRAVENOUS
  Filled 2016-02-04 (×4): qty 25

## 2016-02-04 MED ORDER — NALOXONE HCL 0.4 MG/ML IJ SOLN: 0.4000 mg | INTRAMUSCULAR | Status: DC | PRN

## 2016-02-04 MED ORDER — HYDROMORPHONE HCL 1 MG/ML IJ SOLN
1.0000 mg | INTRAMUSCULAR | Status: DC | PRN
  Administered 2016-02-04 – 2016-02-07 (×33): 1 mg via INTRAVENOUS
  Filled 2016-02-04 (×33): qty 1

## 2016-02-04 MED ORDER — CETYLPYRIDINIUM CHLORIDE 0.05 % MT LIQD
7.0000 mL | Freq: Two times a day (BID) | OROMUCOSAL | Status: DC
  Administered 2016-02-04 (×2): 7 mL via OROMUCOSAL

## 2016-02-04 MED ORDER — ENOXAPARIN SODIUM 40 MG/0.4ML ~~LOC~~ SOLN
40.0000 mg | SUBCUTANEOUS | Status: DC
  Administered 2016-02-04 – 2016-02-09 (×6): 40 mg via SUBCUTANEOUS
  Filled 2016-02-04 (×6): qty 0.4

## 2016-02-04 MED ORDER — LORAZEPAM 2 MG/ML IJ SOLN
1.0000 mg | Freq: Once | INTRAMUSCULAR | Status: AC
  Administered 2016-02-04: 1 mg via INTRAVENOUS
  Filled 2016-02-04: qty 1

## 2016-02-04 MED ORDER — SODIUM CHLORIDE 0.9% FLUSH: 10.0000 mL | INTRAVENOUS | Status: DC | PRN

## 2016-02-04 MED ORDER — MAGNESIUM SULFATE 2 GM/50ML IV SOLN
2.0000 g | Freq: Once | INTRAVENOUS | Status: AC
  Administered 2016-02-04: 2 g via INTRAVENOUS
  Filled 2016-02-04: qty 50

## 2016-02-04 MED ORDER — CHLORHEXIDINE GLUCONATE 0.12 % MT SOLN
15.0000 mL | Freq: Two times a day (BID) | OROMUCOSAL | Status: DC
  Administered 2016-02-04 – 2016-02-05 (×3): 15 mL via OROMUCOSAL
  Filled 2016-02-04 (×2): qty 15

## 2016-02-04 MED ORDER — GABAPENTIN 100 MG PO CAPS
100.0000 mg | ORAL_CAPSULE | Freq: Three times a day (TID) | ORAL | Status: DC
  Filled 2016-02-04 (×2): qty 1

## 2016-02-04 MED ORDER — DIPHENHYDRAMINE HCL 50 MG/ML IJ SOLN
12.5000 mg | Freq: Four times a day (QID) | INTRAMUSCULAR | Status: DC | PRN
Start: 2016-02-04 — End: 2016-02-07

## 2016-02-04 MED ORDER — ACETAMINOPHEN 160 MG/5ML PO SOLN: 650.0000 mg | ORAL | Status: DC | PRN

## 2016-02-04 MED ORDER — DIPHENHYDRAMINE HCL 12.5 MG/5ML PO ELIX: 12.5000 mg | ORAL_SOLUTION | Freq: Four times a day (QID) | ORAL | Status: DC | PRN

## 2016-02-04 MED ORDER — OXYCODONE HCL 5 MG/5ML PO SOLN
5.0000 mg | ORAL | Status: DC | PRN
  Administered 2016-02-04 – 2016-02-05 (×3): 10 mg via ORAL
  Filled 2016-02-04 (×4): qty 10

## 2016-02-04 MED ORDER — PREGABALIN 75 MG PO CAPS
200.0000 mg | ORAL_CAPSULE | Freq: Three times a day (TID) | ORAL | Status: DC
  Administered 2016-02-04 – 2016-02-09 (×14): 200 mg via ORAL
  Filled 2016-02-04: qty 2
  Filled 2016-02-04: qty 1
  Filled 2016-02-04 (×3): qty 2
  Filled 2016-02-04 (×2): qty 1
  Filled 2016-02-04: qty 2
  Filled 2016-02-04 (×5): qty 1
  Filled 2016-02-04: qty 2

## 2016-02-04 MED ORDER — SODIUM CHLORIDE 0.9% FLUSH: 9.0000 mL | INTRAVENOUS | Status: DC | PRN

## 2016-02-04 MED ORDER — SODIUM CHLORIDE 0.9% FLUSH
10.0000 mL | Freq: Two times a day (BID) | INTRAVENOUS | Status: DC
  Administered 2016-02-04: 20 mL
  Administered 2016-02-04 – 2016-02-05 (×2): 10 mL
  Administered 2016-02-05 – 2016-02-06 (×2): 20 mL
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
## 2016-02-04 NOTE — Progress Notes (Signed)
Peripherally Inserted Central Catheter/Midline Placement  The IV Nurse has discussed with the patient and/or persons authorized to consent for the patient, the purpose of this procedure and the potential benefits and risks involved with this procedure.  The benefits include less needle sticks, lab draws from the catheter and patient may be discharged home with the catheter.  Risks include, but not limited to, infection, bleeding, blood clot (thrombus formation), and puncture of an artery; nerve damage and irregular heat beat.  Alternatives to this procedure were also discussed.  PICC/Midline Placement Documentation  PICC Double Lumen 02/04/16 PICC Right Cephalic 36 cm 0 cm (Active)  Indication for Insertion or Continuance of Line Vasoactive infusions;Prolonged intravenous therapies;Poor Vasculature-patient has had multiple peripheral attempts or PIVs lasting less than 24 hours 02/04/2016  1:17 PM  Exposed Catheter (cm) 0 cm 02/04/2016  1:17 PM  Site Assessment Clean;Dry;Intact 02/04/2016  1:17 PM  Lumen #1 Status Flushed;Saline locked;Blood return noted 02/04/2016  1:17 PM  Lumen #2 Status Flushed;Saline locked;Blood return noted 02/04/2016  1:17 PM  Dressing Type Transparent 02/04/2016  1:17 PM  Dressing Status Clean;Dry;Intact;Antimicrobial disc in place 02/04/2016  1:17 PM  Line Care Connections checked and tightened 02/04/2016  1:17 PM  Line Adjustment (NICU/IV Team Only) No 02/04/2016  1:17 PM  Dressing Intervention New dressing 02/04/2016  1:17 PM  Dressing Change Due 02/11/16 02/04/2016  1:17 PM       Rolena Infante 02/04/2016, 1:19 PM

## 2016-02-04 NOTE — Procedures (Signed)
Extubation Procedure Note  Patient Details:   Name: Barbara Rogers DOB: 1981/03/17 MRN: [id number]   Airway Documentation:  Airway 7.5 mm (Active)  Secured at (cm) 22 cm 02/04/2016  3:06 AM  Measured From Lips 02/04/2016  3:06 AM  [place] 02/04/2016  3:06 AM  Secured By [employer] 02/04/2016  3:06 AM  Tube Holder Repositioned Yes 02/04/2016  3:06 AM  Cuff Pressure (cm H2O) 28 cm H2O 02/03/2016  7:26 PM  Site Condition Cool;Dry 02/04/2016  3:06 AM    Evaluation  O2 sats: stable throughout Complications: No apparent complications Patient did tolerate procedure well. Bilateral Breath Sounds: Clear, Diminished   Yes  Patient had good cuff leak. RT extubated patient to 4L nasal canula. Patient oriented to time and place.  [name] 02/04/2016, 8:52 AM
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
## 2016-02-04 NOTE — Progress Notes (Signed)
Orthopedic Tech Progress Note Patient Details:  Barbara Rogers 1981/08/12 [id number]  Patient ID: Barbara Rogers, female   DOB: 23-Feb-1981, 35 y.o.   MRN: [id number] Called in bio-tech brace order; spoke with Jeanne Ivan, Charlee Squibb 02/04/2016, 11:39 AM

## 2016-02-05 ENCOUNTER — Inpatient Hospital Stay (HOSPITAL_COMMUNITY): Payer: No Typology Code available for payment source

## 2016-02-05 LAB — BASIC METABOLIC PANEL
ANION GAP: 8 (ref 5–15)
BUN: 7 mg/dL (ref 6–20)
CHLORIDE: 100 mmol/L — AB (ref 101–111)
CO2: 28 mmol/L (ref 22–32)
Calcium: 8.4 mg/dL — ABNORMAL LOW (ref 8.9–10.3)
Creatinine, Ser: 0.43 mg/dL — ABNORMAL LOW (ref 0.44–1.00)
GFR calc non Af Amer: >60 mL/min (ref >60)
Glucose, Bld: 97 mg/dL (ref 65–99)
POTASSIUM: 3.8 mmol/L (ref 3.5–5.1)
Sodium: 136 mmol/L (ref 135–145)

## 2016-02-05 LAB — CBC
HEMATOCRIT: 28 % — AB (ref 36.0–46.0)
HEMOGLOBIN: 9 g/dL — AB (ref 12.0–15.0)
MCH: 28.6 pg (ref 26.0–34.0)
MCHC: 32.1 g/dL (ref 30.0–36.0)
MCV: 88.9 fL (ref 78.0–100.0)
Platelets: 340 10*3/uL (ref 150–400)
RBC: 3.15 MIL/uL — AB (ref 3.87–5.11)
RDW: 14.3 % (ref 11.5–15.5)
WBC: 10.4 10*3/uL (ref 4.0–10.5)

## 2016-02-05 LAB — GLUCOSE, CAPILLARY
GLUCOSE-CAPILLARY: 79 mg/dL (ref 65–99)
GLUCOSE-CAPILLARY: 101 mg/dL — AB (ref 65–99)
Glucose-Capillary: 100 mg/dL — ABNORMAL HIGH (ref 65–99)
Glucose-Capillary: 107 mg/dL — ABNORMAL HIGH (ref 65–99)
Glucose-Capillary: 91 mg/dL (ref 65–99)

## 2016-02-05 LAB — MAGNESIUM: MAGNESIUM: 1.4 mg/dL — AB (ref 1.7–2.4)

## 2016-02-05 MED ORDER — ACETAMINOPHEN 10 MG/ML IV SOLN
1000.0000 mg | Freq: Four times a day (QID) | INTRAVENOUS | Status: AC
  Administered 2016-02-05 – 2016-02-06 (×3): 1000 mg via INTRAVENOUS
  Filled 2016-02-05 (×3): qty 100

## 2016-02-05 MED ORDER — ACETAMINOPHEN 10 MG/ML IV SOLN
1000.0000 mg | Freq: Four times a day (QID) | INTRAVENOUS | Status: DC
  Administered 2016-02-05: 1000 mg via INTRAVENOUS
  Filled 2016-02-05: qty 100

## 2016-02-05 MED ORDER — CETYLPYRIDINIUM CHLORIDE 0.05 % MT LIQD
7.0000 mL | Freq: Two times a day (BID) | OROMUCOSAL | Status: DC
  Administered 2016-02-05 – 2016-02-07 (×5): 7 mL via OROMUCOSAL

## 2016-02-05 MED ORDER — POTASSIUM CHLORIDE 20 MEQ PO PACK
20.0000 meq | PACK | Freq: Two times a day (BID) | ORAL | Status: DC
  Administered 2016-02-05 – 2016-02-07 (×4): 20 meq via ORAL
  Filled 2016-02-05 (×5): qty 1

## 2016-02-05 NOTE — Progress Notes (Signed)
[facility] P[facility]M  Chart reviewed Extubated yesterday  PCCM will sign off  Roselie Awkward, MD [place] P[facility]M Pager: [phone] Cell: [phone] After 3pm or if no response, call [phone]

## 2016-02-05 NOTE — Progress Notes (Signed)
Subjective: Patient continues to have significant pain, continues on fentanyl PCA. Nursing staff reports that thoracolumbar dressing remained clean and dry. Chest tube remains in place.  Objective: Vital signs in last 24 hours: Filed Vitals:   02/05/16 0337 02/05/16 0400 02/05/16 0600 02/05/16 0800  BP:  137/81 124/80 117/86  Pulse:  104 102 99  Temp:  99.1 F (37.3 C) 99.3 F (37.4 C) 99.1 F (37.3 C)  TempSrc:    Core (Comment)  Resp: 19 22 17 20   Height:      Weight:      SpO2: 97% 98% 97% 98%    Intake/Output from previous day: 05/28 0701 - 05/29 0700 In: 2524.4 [I.V.:2499.4; NG/GT:25] Out: 3400 [Urine:3350; Chest Tube:50] Intake/Output this shift: Total I/O In: 100 [I.V.:100] Out: 250 [Urine:250]  Physical Exam:  Moving all 4 extremities to command.  CBC  Recent Labs  02/03/16 0403 02/05/16 0420  WBC 8.3 10.4  HGB 8.5* 9.0*  HCT 25.3* 28.0*  PLT 185 340   BMET  Recent Labs  02/04/16 0403 02/05/16 0420  NA 141 136  K 3.9 3.8  CL 109 100*  CO2 27 28  GLUCOSE 120* 97  BUN 13 7  CREATININE 0.46 0.43*  CALCIUM 8.0* 8.4*   ABG    Component Value Date/Time   PHART 7.432 02/02/2016 1015   PCO2ART 36.7 02/02/2016 1015   PO2ART 106* 02/02/2016 1015   HCO3 24.0 02/02/2016 1015   TCO2 25.1 02/02/2016 1015   ACIDBASEDEF 4.8* 01/31/2016 0405   O2SAT 98.1 02/02/2016 1015    Studies/Results: Dg Clavicle Left  02/05/2016  CLINICAL DATA:  Left clavicular fracture EXAM: LEFT CLAVICLE - 2+ VIEWS COMPARISON:  01/29/2016 FINDINGS: There again seen fractures in the mid body of the left clavicle as well as the distal aspect. The overall appearance is stable from the prior exam. No acute abnormality noted. IMPRESSION: Stable appearance a left clavicular fracture. Electronically Signed   By: Inez Catalina M.D.   On: 02/05/2016 07:21   Dg Chest Port 1 View  02/05/2016  CLINICAL DATA:  Shortness of breath EXAM: PORTABLE CHEST 1 VIEW COMPARISON:  02/03/2016 chest  radiograph. FINDINGS: Right PICC terminates in the lower third of the superior vena cava. Medial apical right chest tube is stable in position. Partially visualized posterior fusion hardware in the lower thoracic spine. Stable cardiomediastinal silhouette with normal heart size. Small right lateral basilar pneumothorax, not appreciably changed. No left pneumothorax. Stable small left pleural effusion. No pulmonary edema. Stable hazy bibasilar lung opacities. Left mid clavicle fracture. Right posterior third, fourth and fifth rib fractures. Multiple lower lateral right rib fractures. Multiple lateral left rib fractures. IMPRESSION: 1. Stable small lateral basilar right pneumothorax. 2. Stable small left pleural effusion. 3. Stable bibasilar lung opacities, favor atelectasis and/or contusion. 4. Re- demonstration of left clavicle and multiple bilateral rib fractures. Electronically Signed   By: Ilona Sorrel M.D.   On: 02/05/2016 07:24    Assessment/Plan: Stable from a neurosurgical perspective. Continue care per trauma surgical service.   Hosie Spangle, MD 02/05/2016, 8:10 AM
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
## 2016-02-05 NOTE — Progress Notes (Signed)
Rehab Admissions Coordinator Note:  Patient was screened by Cleatrice Burke for appropriateness for an Inpatient Acute Rehab Consult per PT and OT recommendation.  At this time, we are recommending Inpatient Rehab consult.  Cleatrice Burke 02/05/2016, 3:08 PM  I can be reached at [phone].

## 2016-02-06 ENCOUNTER — Inpatient Hospital Stay (HOSPITAL_COMMUNITY): Payer: No Typology Code available for payment source

## 2016-02-06 DIAGNOSIS — J939 Pneumothorax, unspecified: Secondary | ICD-10-CM

## 2016-02-06 DIAGNOSIS — S22081D Stable burst fracture of T11-T12 vertebra, subsequent encounter for fracture with routine healing: Secondary | ICD-10-CM

## 2016-02-06 DIAGNOSIS — T148XXA Other injury of unspecified body region, initial encounter: Secondary | ICD-10-CM

## 2016-02-06 DIAGNOSIS — R1310 Dysphagia, unspecified: Secondary | ICD-10-CM

## 2016-02-06 DIAGNOSIS — S299XXA Unspecified injury of thorax, initial encounter: Secondary | ICD-10-CM

## 2016-02-06 DIAGNOSIS — D62 Acute posthemorrhagic anemia: Secondary | ICD-10-CM | POA: Diagnosis not present

## 2016-02-06 DIAGNOSIS — S2243XD Multiple fractures of ribs, bilateral, subsequent encounter for fracture with routine healing: Secondary | ICD-10-CM

## 2016-02-06 DIAGNOSIS — S298XXD Other specified injuries of thorax, subsequent encounter: Secondary | ICD-10-CM

## 2016-02-06 DIAGNOSIS — T148 Other injury of unspecified body region: Secondary | ICD-10-CM

## 2016-02-06 DIAGNOSIS — Z720 Tobacco use: Secondary | ICD-10-CM

## 2016-02-06 DIAGNOSIS — S32008D Other fracture of unspecified lumbar vertebra, subsequent encounter for fracture with routine healing: Secondary | ICD-10-CM

## 2016-02-06 DIAGNOSIS — S060X9D Concussion with loss of consciousness of unspecified duration, subsequent encounter: Secondary | ICD-10-CM

## 2016-02-06 DIAGNOSIS — S12201A Unspecified nondisplaced fracture of third cervical vertebra, initial encounter for closed fracture: Secondary | ICD-10-CM

## 2016-02-06 DIAGNOSIS — R000 Tachycardia, unspecified: Secondary | ICD-10-CM

## 2016-02-06 DIAGNOSIS — R0682 Tachypnea, not elsewhere classified: Secondary | ICD-10-CM

## 2016-02-06 DIAGNOSIS — S0101XD Laceration without foreign body of scalp, subsequent encounter: Secondary | ICD-10-CM

## 2016-02-06 DIAGNOSIS — F4323 Adjustment disorder with mixed anxiety and depressed mood: Secondary | ICD-10-CM

## 2016-02-06 DIAGNOSIS — S12201D Unspecified nondisplaced fracture of third cervical vertebra, subsequent encounter for fracture with routine healing: Secondary | ICD-10-CM

## 2016-02-06 LAB — GLUCOSE, CAPILLARY
GLUCOSE-CAPILLARY: 101 mg/dL — AB (ref 65–99)
GLUCOSE-CAPILLARY: 95 mg/dL (ref 65–99)
GLUCOSE-CAPILLARY: 97 mg/dL (ref 65–99)
Glucose-Capillary: 113 mg/dL — ABNORMAL HIGH (ref 65–99)
Glucose-Capillary: 115 mg/dL — ABNORMAL HIGH (ref 65–99)
Glucose-Capillary: 112 mg/dL — ABNORMAL HIGH (ref 65–99)
Glucose-Capillary: 110 mg/dL — ABNORMAL HIGH (ref 65–99)

## 2016-02-06 MED ORDER — IPRATROPIUM-ALBUTEROL 0.5-2.5 (3) MG/3ML IN SOLN
3.0000 mL | Freq: Two times a day (BID) | RESPIRATORY_TRACT | Status: DC
  Administered 2016-02-07 – 2016-02-08 (×3): 3 mL via RESPIRATORY_TRACT
  Filled 2016-02-06 (×3): qty 3

## 2016-02-06 MED ORDER — IPRATROPIUM-ALBUTEROL 0.5-2.5 (3) MG/3ML IN SOLN
3.0000 mL | Freq: Four times a day (QID) | RESPIRATORY_TRACT | Status: DC
  Administered 2016-02-06: 3 mL via RESPIRATORY_TRACT
  Filled 2016-02-06: qty 3

## 2016-02-06 MED ORDER — METHOCARBAMOL 1000 MG/10ML IJ SOLN
1000.0000 mg | Freq: Three times a day (TID) | INTRAVENOUS | Status: DC | PRN
  Administered 2016-02-06 (×2): 1000 mg via INTRAVENOUS
  Filled 2016-02-06 (×4): qty 10

## 2016-02-06 MED ORDER — ALBUTEROL SULFATE (2.5 MG/3ML) 0.083% IN NEBU: 2.5000 mg | INHALATION_SOLUTION | RESPIRATORY_TRACT | Status: DC | PRN

## 2016-02-06 MED ORDER — OXYCODONE HCL 5 MG PO TABS
5.0000 mg | ORAL_TABLET | ORAL | Status: DC | PRN
  Administered 2016-02-06 – 2016-02-08 (×9): 15 mg via ORAL
  Filled 2016-02-06: qty 2
  Filled 2016-02-06 (×9): qty 3
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
## 2016-02-06 NOTE — Progress Notes (Signed)
Inpatient Rehabilitation  I met with the patient at the bedside to discuss results of IP rehab consult. I talked to her about the IP Rehab program and provided her with informational booklets.  I explained that we will monitor pt. for progress but that she may progress well enough that she will not need CIR.  I will follow along .  Please call if questions.  [place] Admissions Coordinator Cell [phone] Office [phone]

## 2016-02-07 ENCOUNTER — Encounter (HOSPITAL_COMMUNITY): Payer: Self-pay | Admitting: Neurosurgery

## 2016-02-07 ENCOUNTER — Inpatient Hospital Stay (HOSPITAL_COMMUNITY): Payer: No Typology Code available for payment source

## 2016-02-07 LAB — BASIC METABOLIC PANEL
Anion gap: 8 (ref 5–15)
BUN: 5 mg/dL — AB (ref 6–20)
CHLORIDE: 100 mmol/L — AB (ref 101–111)
CO2: 29 mmol/L (ref 22–32)
Calcium: 8.6 mg/dL — ABNORMAL LOW (ref 8.9–10.3)
Creatinine, Ser: 0.5 mg/dL (ref 0.44–1.00)
GFR calc non Af Amer: >60 mL/min (ref >60)
Glucose, Bld: 102 mg/dL — ABNORMAL HIGH (ref 65–99)
POTASSIUM: 3.9 mmol/L (ref 3.5–5.1)
SODIUM: 137 mmol/L (ref 135–145)

## 2016-02-07 LAB — GLUCOSE, CAPILLARY
GLUCOSE-CAPILLARY: 118 mg/dL — AB (ref 65–99)
GLUCOSE-CAPILLARY: 99 mg/dL (ref 65–99)
Glucose-Capillary: 114 mg/dL — ABNORMAL HIGH (ref 65–99)

## 2016-02-07 LAB — CBC
HEMATOCRIT: 28.8 % — AB (ref 36.0–46.0)
HEMOGLOBIN: 9.1 g/dL — AB (ref 12.0–15.0)
MCH: 28.4 pg (ref 26.0–34.0)
MCHC: 31.6 g/dL (ref 30.0–36.0)
MCV: 90 fL (ref 78.0–100.0)
Platelets: 441 10*3/uL — ABNORMAL HIGH (ref 150–400)
RBC: 3.2 MIL/uL — AB (ref 3.87–5.11)
RDW: 14.7 % (ref 11.5–15.5)
WBC: 8.7 10*3/uL (ref 4.0–10.5)

## 2016-02-07 MED ORDER — QUETIAPINE FUMARATE 50 MG PO TABS
50.0000 mg | ORAL_TABLET | Freq: Two times a day (BID) | ORAL | Status: DC
  Administered 2016-02-08 – 2016-02-09 (×2): 50 mg via ORAL
  Filled 2016-02-07 (×4): qty 1

## 2016-02-07 MED ORDER — FENTANYL 25 MCG/HR TD PT72
50.0000 ug | MEDICATED_PATCH | TRANSDERMAL | Status: DC
  Administered 2016-02-07 (×2): 50 ug via TRANSDERMAL
  Filled 2016-02-07: qty 1
  Filled 2016-02-07: qty 2

## 2016-02-07 MED ORDER — METHOCARBAMOL 500 MG PO TABS
1000.0000 mg | ORAL_TABLET | Freq: Three times a day (TID) | ORAL | Status: DC
  Administered 2016-02-07 (×3): 1000 mg via ORAL
  Filled 2016-02-07 (×3): qty 2

## 2016-02-07 MED ORDER — LORAZEPAM 2 MG/ML IJ SOLN
1.0000 mg | Freq: Four times a day (QID) | INTRAMUSCULAR | Status: DC | PRN
  Administered 2016-02-07 – 2016-02-08 (×2): 1 mg via INTRAVENOUS
  Filled 2016-02-07 (×2): qty 1

## 2016-02-07 MED ORDER — KETOROLAC TROMETHAMINE 30 MG/ML IJ SOLN
30.0000 mg | Freq: Once | INTRAMUSCULAR | Status: AC
  Administered 2016-02-07: 30 mg via INTRAVENOUS
  Filled 2016-02-07: qty 1

## 2016-02-07 MED ORDER — POTASSIUM CHLORIDE CRYS ER 20 MEQ PO TBCR
20.0000 meq | EXTENDED_RELEASE_TABLET | Freq: Two times a day (BID) | ORAL | Status: DC
  Administered 2016-02-07 – 2016-02-09 (×3): 20 meq via ORAL
  Filled 2016-02-07 (×4): qty 1

## 2016-02-07 MED ORDER — NALOXONE HCL 0.4 MG/ML IJ SOLN: 0.4000 mg | INTRAMUSCULAR | Status: DC | PRN

## 2016-02-07 MED ORDER — LEVOTHYROXINE SODIUM 50 MCG PO TABS
50.0000 ug | ORAL_TABLET | Freq: Every day | ORAL | Status: DC
Start: 2016-02-08 — End: 2016-02-09
  Administered 2016-02-08 – 2016-02-09 (×2): 50 ug via ORAL
  Filled 2016-02-07 (×2): qty 1

## 2016-02-07 MED ORDER — KETOROLAC TROMETHAMINE 15 MG/ML IJ SOLN
15.0000 mg | Freq: Four times a day (QID) | INTRAMUSCULAR | Status: DC
  Administered 2016-02-07 – 2016-02-08 (×4): 15 mg via INTRAVENOUS
  Filled 2016-02-07 (×4): qty 1

## 2016-02-07 MED ORDER — ONDANSETRON HCL 4 MG/2ML IJ SOLN
4.0000 mg | Freq: Four times a day (QID) | INTRAMUSCULAR | Status: DC | PRN
Start: 2016-02-07 — End: 2016-02-08

## 2016-02-07 MED ORDER — DIPHENHYDRAMINE HCL 50 MG/ML IJ SOLN: 12.5000 mg | Freq: Four times a day (QID) | INTRAMUSCULAR | Status: DC | PRN

## 2016-02-07 MED ORDER — DIPHENHYDRAMINE HCL 12.5 MG/5ML PO ELIX: 12.5000 mg | ORAL_SOLUTION | Freq: Four times a day (QID) | ORAL | Status: DC | PRN

## 2016-02-07 MED ORDER — HYDROMORPHONE 1 MG/ML IV SOLN
INTRAVENOUS | Status: DC
  Administered 2016-02-07: 4.2 mg via INTRAVENOUS
  Administered 2016-02-07: 3.6 mg via INTRAVENOUS
  Administered 2016-02-07: 2.1 mg via INTRAVENOUS
  Administered 2016-02-07: 1.2 mg via INTRAVENOUS
  Administered 2016-02-07: 11:00:00 via INTRAVENOUS
  Administered 2016-02-08: 3 mg via INTRAVENOUS
  Administered 2016-02-08: 3.3 mg via INTRAVENOUS
  Filled 2016-02-07: qty 25

## 2016-02-07 MED ORDER — SODIUM CHLORIDE 0.9% FLUSH: 9.0000 mL | INTRAVENOUS | Status: DC | PRN
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
## 2016-02-07 NOTE — Progress Notes (Signed)
Patient ID: Barbara Rogers, female   DOB: March 07, 1981, 35 y.o.   MRN: [id number] [name] is doing great condition of back pain but no radiculopathy some neck pain but maintaining cervical collar.  Strength out of 5 wound clean dry and intact cervical collar in good position and readjusted  Continue to mobilize with physical occupational therapy maintain a cervical collar at all times and wear back brace whenever out of bed.

## 2016-02-07 NOTE — Progress Notes (Signed)
Report to [place], This RN and Phineas Real, RN checked patient's fentanyl patch and it is still on her left shoulder, intact.   Rachael Zapanta, RN.
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
## 2016-02-07 NOTE — Progress Notes (Addendum)
Patient arrived to 5C15 from [place]. Safety precautions and orders reviewed with patient. 28M Rn Mahala Menghini) and received RN (Amayia Ciano) verified no fentanyl patch on patient which pt also verbalized she did not feel like it is on. Fentanyl patch reapplied at this time. Student RN Suanne Marker) at bedside while RN reapplied patch at this time time. PCA encouraged for pain management along with ICS application. No other distress noted. Will continue to monitor.   Ave Filter, RN
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
## 2016-02-08 ENCOUNTER — Inpatient Hospital Stay (HOSPITAL_COMMUNITY): Payer: No Typology Code available for payment source

## 2016-02-08 ENCOUNTER — Encounter: Payer: Self-pay | Admitting: Orthopedic Surgery

## 2016-02-08 LAB — BASIC METABOLIC PANEL
Anion gap: 6 (ref 5–15)
BUN: 7 mg/dL (ref 6–20)
CHLORIDE: 104 mmol/L (ref 101–111)
CO2: 28 mmol/L (ref 22–32)
CREATININE: 0.49 mg/dL (ref 0.44–1.00)
Calcium: 8.3 mg/dL — ABNORMAL LOW (ref 8.9–10.3)
Glucose, Bld: 100 mg/dL — ABNORMAL HIGH (ref 65–99)
POTASSIUM: 4.2 mmol/L (ref 3.5–5.1)
SODIUM: 138 mmol/L (ref 135–145)

## 2016-02-08 LAB — URINALYSIS, ROUTINE W REFLEX MICROSCOPIC
BILIRUBIN URINE: NEGATIVE
Glucose, UA: NEGATIVE mg/dL
Ketones, ur: NEGATIVE mg/dL
LEUKOCYTES UA: NEGATIVE
NITRITE: POSITIVE — AB
Protein, ur: NEGATIVE mg/dL
SPECIFIC GRAVITY, URINE: 1.019 (ref 1.005–1.030)
pH: 6 (ref 5.0–8.0)

## 2016-02-08 LAB — CBC WITH DIFFERENTIAL/PLATELET
BASOS PCT: 0 %
Basophils Absolute: 0 10*3/uL (ref 0.0–0.1)
EOS ABS: 0.6 10*3/uL (ref 0.0–0.7)
Eosinophils Relative: 8 %
HCT: 26.9 % — ABNORMAL LOW (ref 36.0–46.0)
HEMOGLOBIN: 8.4 g/dL — AB (ref 12.0–15.0)
Lymphocytes Relative: 29 %
Lymphs Abs: 2.1 10*3/uL (ref 0.7–4.0)
MCH: 28.5 pg (ref 26.0–34.0)
MCHC: 31.2 g/dL (ref 30.0–36.0)
MCV: 91.2 fL (ref 78.0–100.0)
Monocytes Absolute: 0.9 10*3/uL (ref 0.1–1.0)
Monocytes Relative: 12 %
Neutro Abs: 3.8 10*3/uL (ref 1.7–7.7)
Neutrophils Relative %: 51 %
PLATELETS: 458 10*3/uL — AB (ref 150–400)
RBC: 2.95 MIL/uL — AB (ref 3.87–5.11)
RDW: 15 % (ref 11.5–15.5)
WBC: 7.4 10*3/uL (ref 4.0–10.5)

## 2016-02-08 LAB — URINE MICROSCOPIC-ADD ON

## 2016-02-08 MED ORDER — HYDROXYZINE HCL 25 MG PO TABS
25.0000 mg | ORAL_TABLET | Freq: Four times a day (QID) | ORAL | Status: DC
  Administered 2016-02-08 – 2016-02-09 (×4): 25 mg via ORAL
  Filled 2016-02-08 (×6): qty 1

## 2016-02-08 MED ORDER — ALPRAZOLAM 0.5 MG PO TABS
1.0000 mg | ORAL_TABLET | ORAL | Status: DC | PRN
  Administered 2016-02-08 – 2016-02-09 (×6): 1 mg via ORAL
  Filled 2016-02-08 (×6): qty 2

## 2016-02-08 MED ORDER — METHOCARBAMOL 500 MG PO TABS
1000.0000 mg | ORAL_TABLET | Freq: Four times a day (QID) | ORAL | Status: DC
  Administered 2016-02-08 – 2016-02-09 (×6): 1000 mg via ORAL
  Filled 2016-02-08 (×6): qty 2

## 2016-02-08 MED ORDER — FENTANYL 25 MCG/HR TD PT72
100.0000 ug | MEDICATED_PATCH | TRANSDERMAL | Status: DC
  Administered 2016-02-08: 100 ug via TRANSDERMAL
  Filled 2016-02-08: qty 4

## 2016-02-08 MED ORDER — OXYCODONE HCL 5 MG PO TABS
10.0000 mg | ORAL_TABLET | ORAL | Status: DC | PRN
  Administered 2016-02-08 (×3): 20 mg via ORAL
  Administered 2016-02-09: 10 mg via ORAL
  Administered 2016-02-09: 20 mg via ORAL
  Filled 2016-02-08: qty 4
  Filled 2016-02-08: qty 2
  Filled 2016-02-08 (×3): qty 4

## 2016-02-08 MED ORDER — HYDROMORPHONE HCL 1 MG/ML IJ SOLN: 1.0000 mg | INTRAMUSCULAR | Status: DC | PRN

## 2016-02-08 MED ORDER — TRAMADOL HCL 50 MG PO TABS
100.0000 mg | ORAL_TABLET | Freq: Four times a day (QID) | ORAL | Status: DC
  Administered 2016-02-08 – 2016-02-09 (×6): 100 mg via ORAL
  Filled 2016-02-08 (×6): qty 2

## 2016-02-08 MED ORDER — IPRATROPIUM-ALBUTEROL 0.5-2.5 (3) MG/3ML IN SOLN: 3.0000 mL | RESPIRATORY_TRACT | Status: DC | PRN

## 2016-02-08 MED ORDER — HYDROXYZINE PAMOATE 25 MG PO CAPS
25.0000 mg | ORAL_CAPSULE | Freq: Four times a day (QID) | ORAL | Status: DC
  Filled 2016-02-08 (×2): qty 1

## 2016-02-08 MED ORDER — SULFAMETHOXAZOLE-TRIMETHOPRIM 800-160 MG PO TABS
1.0000 | ORAL_TABLET | Freq: Two times a day (BID) | ORAL | Status: DC
  Administered 2016-02-08 – 2016-02-09 (×3): 1 via ORAL
  Filled 2016-02-08 (×3): qty 1

## 2016-02-08 MED ORDER — AMPHETAMINE-DEXTROAMPHETAMINE 10 MG PO TABS
20.0000 mg | ORAL_TABLET | Freq: Three times a day (TID) | ORAL | Status: DC
  Administered 2016-02-08 – 2016-02-09 (×3): 20 mg via ORAL
  Filled 2016-02-08 (×5): qty 2
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
## 2016-02-08 NOTE — Progress Notes (Signed)
PT Cancellation Note  Patient Details Name: BLANNIE ARMENTO MRN: [id number] DOB: April 12, 1981   Cancelled Treatment:    Reason Eval/Treat Not Completed: Pain limiting ability to participate;Fatigue/lethargy limiting ability to participate.  Pt adamantly declining any PT and mobility at this time due to pain since stopping PCA.  Will f/u another time.     Catarina Hartshorn, [place] 02/08/2016, 10:37 AM

## 2016-02-08 NOTE — Progress Notes (Signed)
Orthopaedic Trauma Service (OTS)   Subjective: Patient reports pain as moderate.  Not using crutches or walker.  Anticipates 3 months out of work and willing to wait for clavicle to heal.  Objective: Temp:  [97.2 F (36.2 C)-99.7 F (37.6 C)] 98.2 F (36.8 C) (06/01 1328) Pulse Rate:  [88-106] 96 (06/01 1328) Resp:  [12-22] 20 (06/01 1328) BP: (119-132)/(58-86) 127/76 mmHg (06/01 1328) SpO2:  [95 %-100 %] 98 % (06/01 1328) Physical Exam LUEx  Tender clavicle but no tending  shoulder, elbow, wrist, digits- no skin wounds, nontender, no instability, no blocks to gentle motion  Sens  Ax/R/M/U intact  Mot   Ax/ R/ PIN/ M/ AIN/ U intact  Rad 2+    Assessment/Plan: 9 Days Post-Op Procedure(s) (LRB): T9-L2 Posterior Stabilization, Posterior Lumbar Fusion with Pedicle Screws (N/A)  Closed treatment of left clavicle, improving function but persistent pain  Patient reports more motion and activity over the last three days than in the preceding entire admission and so would like to recheck upright xrays at this time as opposed to waiting until the office. Sling for comfort.  Altamese Hopewell, MD [facility] [phone] (p)
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
## 2016-02-09 ENCOUNTER — Encounter (HOSPITAL_COMMUNITY): Payer: Self-pay | Admitting: Neurosurgery

## 2016-02-09 DIAGNOSIS — S272XXA Traumatic hemopneumothorax, initial encounter: Secondary | ICD-10-CM

## 2016-02-09 DIAGNOSIS — S42102A Fracture of unspecified part of scapula, left shoulder, initial encounter for closed fracture: Secondary | ICD-10-CM | POA: Diagnosis present

## 2016-02-09 HISTORY — DX: Traumatic hemopneumothorax, initial encounter: S27.2XXA

## 2016-02-09 MED ORDER — HYDROXYZINE PAMOATE 25 MG PO CAPS: 25.0000 mg | ORAL_CAPSULE | Freq: Four times a day (QID) | ORAL | Status: DC

## 2016-02-09 MED ORDER — FLUCONAZOLE 150 MG PO TABS: 150.0000 mg | ORAL_TABLET | Freq: Every day | ORAL | Status: DC

## 2016-02-09 MED ORDER — ALPRAZOLAM 1 MG PO TABS: 1.0000 mg | ORAL_TABLET | ORAL | Status: DC

## 2016-02-09 MED ORDER — FLUCONAZOLE 100 MG PO TABS
150.0000 mg | ORAL_TABLET | Freq: Every day | ORAL | Status: DC
  Administered 2016-02-09: 150 mg via ORAL
  Filled 2016-02-09: qty 2

## 2016-02-09 MED ORDER — HYDROMORPHONE HCL 1 MG/ML IJ SOLN: 0.5000 mg | INTRAMUSCULAR | Status: DC | PRN

## 2016-02-09 MED ORDER — PREGABALIN 200 MG PO CAPS: 200.0000 mg | ORAL_CAPSULE | Freq: Three times a day (TID) | ORAL | Status: DC

## 2016-02-09 MED ORDER — CYCLOBENZAPRINE HCL 10 MG PO TABS: 5.0000 mg | ORAL_TABLET | Freq: Three times a day (TID) | ORAL | Status: DC | PRN

## 2016-02-09 MED ORDER — POLYETHYLENE GLYCOL 3350 17 G PO PACK
17.0000 g | PACK | Freq: Every day | ORAL | Status: DC
  Administered 2016-02-09: 17 g via ORAL
  Filled 2016-02-09: qty 1

## 2016-02-09 MED ORDER — TRAMADOL HCL 50 MG PO TABS: 100.0000 mg | ORAL_TABLET | Freq: Four times a day (QID) | ORAL | Status: DC

## 2016-02-09 MED ORDER — OXYCODONE HCL ER 20 MG PO T12A
40.0000 mg | EXTENDED_RELEASE_TABLET | Freq: Two times a day (BID) | ORAL | Status: DC
  Administered 2016-02-09: 40 mg via ORAL
  Filled 2016-02-09: qty 2

## 2016-02-09 MED ORDER — OXYCODONE HCL ER 10 MG PO T12A: EXTENDED_RELEASE_TABLET | ORAL | Status: DC

## 2016-02-09 MED ORDER — OXYCODONE-ACETAMINOPHEN 10-325 MG PO TABS: 1.0000 | ORAL_TABLET | ORAL | Status: DC | PRN

## 2016-02-09 MED ORDER — DOCUSATE SODIUM 100 MG PO CAPS: 100.0000 mg | ORAL_CAPSULE | Freq: Two times a day (BID) | ORAL | Status: DC

## 2016-02-09 MED ORDER — SULFAMETHOXAZOLE-TRIMETHOPRIM 800-160 MG PO TABS: 1.0000 | ORAL_TABLET | Freq: Two times a day (BID) | ORAL | Status: DC

## 2016-02-09 MED ORDER — DOCUSATE SODIUM 100 MG PO CAPS
100.0000 mg | ORAL_CAPSULE | Freq: Two times a day (BID) | ORAL | Status: DC
  Administered 2016-02-09: 100 mg via ORAL
  Filled 2016-02-09: qty 1

## 2016-02-09 MED ORDER — POLYETHYLENE GLYCOL 3350 17 G PO PACK: 17.0000 g | PACK | Freq: Every day | ORAL | Status: DC

## 2016-02-09 NOTE — Progress Notes (Signed)
Fentanyl patches removed from pateint's shoulders, waste in bin with Student Rn Rossie Muskrat, RN

## 2016-02-09 NOTE — Progress Notes (Addendum)
Discharge instruction reviewed with patient. All questions answered at this time. RXs given to patient. Note also given. No other concerns voice at this time. Awaiting on transport from family.   Ave Filter, RN
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
## 2016-02-09 NOTE — OR Nursing (Signed)
Addendum made to implant log, Serial # corrected.

## 2016-02-09 NOTE — Discharge Instructions (Signed)
Keep collar on at all times. Wear back brace when sitting or standing.  No driving while taking oxycodone.

## 2016-02-09 NOTE — Care Management Note (Signed)
Case Management Note  Patient Details  Name: Barbara Rogers MRN: [id number] Date of Birth: 1981-03-03  Subjective/Objective:   Pt medically stable for dc home today; boyfriend to provide intermittent assistance.  PT/OT recommending home health follow.                   Action/Plan: Pt agreeable to [facility] therapies; referral to [facility] for [facility] needs.  Start of care 24-48h post dc date.  Best phone # for [facility] care agency to call per pt, is [phone].  Pt denies any DME needs for home.    Expected Discharge Date:   02/09/2016               Expected Discharge Plan:  [place]  In-House Referral:     Discharge planning Services  CM Consult  Post Acute Care Choice:  Home Health Choice offered to:     DME Arranged:    DME Agency:     HH Arranged:  PT, OT HH Agency:  [place]  Status of Service:  Completed, signed off  Medicare Important Message Given:    Date Medicare IM Given:    Medicare IM give by:    Date Additional Medicare IM Given:    Additional Medicare Important Message give by:     If discussed at [place] of Stay Meetings, dates discussed:    Additional Comments:  Reinaldo Raddle, RN, BSN  Trauma/Neuro ICU Case Manager [phone]

## 2016-02-09 NOTE — Clinical Social Work Note (Signed)
Clinical Social Worker continuing to follow patient for support.  Patient is ready for discharge home and has made transportation arrangements.  Patient provided with work letter for [place].  Clinical Social Worker will sign off for now as social work intervention is no longer needed. Please consult [place] again if new need arises.  Barbette Or, [place]
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
## 2016-02-13 ENCOUNTER — Encounter (HOSPITAL_COMMUNITY): Payer: Self-pay | Admitting: *Deleted

## 2016-02-13 ENCOUNTER — Emergency Department (HOSPITAL_COMMUNITY): Payer: BLUE CROSS/BLUE SHIELD

## 2016-02-13 ENCOUNTER — Emergency Department (HOSPITAL_COMMUNITY)
Admission: EM | Admit: 2016-02-13 | Discharge: 2016-02-13 | Disposition: A | Discharge status: Home / Self Care | Payer: BLUE CROSS/BLUE SHIELD | Attending: Emergency Medicine | Admitting: Emergency Medicine

## 2016-02-13 DIAGNOSIS — F329 Major depressive disorder, single episode, unspecified: Secondary | ICD-10-CM | POA: Insufficient documentation

## 2016-02-13 DIAGNOSIS — J441 Chronic obstructive pulmonary disease with (acute) exacerbation: Secondary | ICD-10-CM | POA: Diagnosis not present

## 2016-02-13 DIAGNOSIS — Z79899 Other long term (current) drug therapy: Secondary | ICD-10-CM | POA: Diagnosis not present

## 2016-02-13 DIAGNOSIS — F17200 Nicotine dependence, unspecified, uncomplicated: Secondary | ICD-10-CM | POA: Insufficient documentation

## 2016-02-13 DIAGNOSIS — S42002D Fracture of unspecified part of left clavicle, subsequent encounter for fracture with routine healing: Secondary | ICD-10-CM | POA: Diagnosis not present

## 2016-02-13 DIAGNOSIS — Z79891 Long term (current) use of opiate analgesic: Secondary | ICD-10-CM | POA: Insufficient documentation

## 2016-02-13 DIAGNOSIS — S42102D Fracture of unspecified part of scapula, left shoulder, subsequent encounter for fracture with routine healing: Secondary | ICD-10-CM | POA: Diagnosis not present

## 2016-02-13 DIAGNOSIS — R0602 Shortness of breath: Secondary | ICD-10-CM | POA: Diagnosis present

## 2016-02-13 DIAGNOSIS — J45909 Unspecified asthma, uncomplicated: Secondary | ICD-10-CM | POA: Diagnosis not present

## 2016-02-13 DIAGNOSIS — Z720 Tobacco use: Secondary | ICD-10-CM

## 2016-02-13 DIAGNOSIS — S2243XD Multiple fractures of ribs, bilateral, subsequent encounter for fracture with routine healing: Secondary | ICD-10-CM | POA: Insufficient documentation

## 2016-02-13 DIAGNOSIS — J9811 Atelectasis: Secondary | ICD-10-CM

## 2016-02-13 LAB — URINE MICROSCOPIC-ADD ON: WBC UA: NONE SEEN WBC/hpf (ref 0–5)

## 2016-02-13 LAB — URINALYSIS, ROUTINE W REFLEX MICROSCOPIC
Glucose, UA: NEGATIVE mg/dL
Ketones, ur: >80 mg/dL — AB
LEUKOCYTES UA: NEGATIVE
NITRITE: NEGATIVE
pH: 5.5 (ref 5.0–8.0)

## 2016-02-13 LAB — COMPREHENSIVE METABOLIC PANEL
ALT: 20 U/L (ref 14–54)
AST: 15 U/L (ref 15–41)
Albumin: 3.9 g/dL (ref 3.5–5.0)
Alkaline Phosphatase: 279 U/L — ABNORMAL HIGH (ref 38–126)
Anion gap: 13 (ref 5–15)
BILIRUBIN TOTAL: 0.7 mg/dL (ref 0.3–1.2)
BUN: 8 mg/dL (ref 6–20)
CHLORIDE: 95 mmol/L — AB (ref 101–111)
CO2: 23 mmol/L (ref 22–32)
CREATININE: 0.61 mg/dL (ref 0.44–1.00)
Calcium: 9.4 mg/dL (ref 8.9–10.3)
Glucose, Bld: 141 mg/dL — ABNORMAL HIGH (ref 65–99)
POTASSIUM: 3.5 mmol/L (ref 3.5–5.1)
Sodium: 131 mmol/L — ABNORMAL LOW (ref 135–145)
TOTAL PROTEIN: 8.4 g/dL — AB (ref 6.5–8.1)

## 2016-02-13 LAB — CBC WITH DIFFERENTIAL/PLATELET
BASOS ABS: 0 10*3/uL (ref 0.0–0.1)
Basophils Relative: 0 %
EOS ABS: 0.3 10*3/uL (ref 0.0–0.7)
EOS PCT: 4 %
HCT: 37 % (ref 36.0–46.0)
HEMOGLOBIN: 11.5 g/dL — AB (ref 12.0–15.0)
LYMPHS ABS: 2 10*3/uL (ref 0.7–4.0)
Lymphocytes Relative: 22 %
MCH: 28.2 pg (ref 26.0–34.0)
MCHC: 31.1 g/dL (ref 30.0–36.0)
MCV: 90.7 fL (ref 78.0–100.0)
Monocytes Absolute: 0.4 10*3/uL (ref 0.1–1.0)
Monocytes Relative: 5 %
NEUTROS PCT: 69 %
Neutro Abs: 6.2 10*3/uL (ref 1.7–7.7)
PLATELETS: 751 10*3/uL — AB (ref 150–400)
RBC: 4.08 MIL/uL (ref 3.87–5.11)
RDW: 14.9 % (ref 11.5–15.5)
WBC: 8.9 10*3/uL (ref 4.0–10.5)

## 2016-02-13 LAB — PREGNANCY, URINE: PREG TEST UR: NEGATIVE

## 2016-02-13 MED ORDER — HYDROMORPHONE HCL 1 MG/ML IJ SOLN
1.0000 mg | Freq: Once | INTRAMUSCULAR | Status: AC
  Administered 2016-02-13: 1 mg via INTRAVENOUS
  Filled 2016-02-13: qty 1

## 2016-02-13 MED ORDER — ALBUTEROL SULFATE (2.5 MG/3ML) 0.083% IN NEBU
5.0000 mg | INHALATION_SOLUTION | Freq: Once | RESPIRATORY_TRACT | Status: AC
  Administered 2016-02-13: 5 mg via RESPIRATORY_TRACT
  Filled 2016-02-13: qty 6

## 2016-02-13 MED ORDER — IPRATROPIUM-ALBUTEROL 0.5-2.5 (3) MG/3ML IN SOLN
3.0000 mL | Freq: Once | RESPIRATORY_TRACT | Status: AC
  Administered 2016-02-13: 3 mL via RESPIRATORY_TRACT
  Filled 2016-02-13: qty 3

## 2016-02-13 MED ORDER — METHYLPREDNISOLONE SODIUM SUCC 125 MG IJ SOLR
125.0000 mg | Freq: Once | INTRAMUSCULAR | Status: AC
  Administered 2016-02-13: 125 mg via INTRAVENOUS
  Filled 2016-02-13: qty 2

## 2016-02-13 MED ORDER — FENTANYL 50 MCG/HR TD PT72: 50.0000 ug | MEDICATED_PATCH | TRANSDERMAL | Status: DC

## 2016-02-13 MED ORDER — SODIUM CHLORIDE 0.9 % IV BOLUS (SEPSIS)
1000.0000 mL | Freq: Once | INTRAVENOUS | Status: AC
  Administered 2016-02-13: 1000 mL via INTRAVENOUS

## 2016-02-13 MED ORDER — ONDANSETRON HCL 4 MG/2ML IJ SOLN
4.0000 mg | Freq: Once | INTRAMUSCULAR | Status: AC
  Administered 2016-02-13: 4 mg via INTRAVENOUS
  Filled 2016-02-13: qty 2

## 2016-02-13 MED ORDER — IOPAMIDOL (ISOVUE-370) INJECTION 76%
100.0000 mL | Freq: Once | INTRAVENOUS | Status: AC | PRN
  Administered 2016-02-13: 100 mL via INTRAVENOUS

## 2016-02-13 MED ORDER — PREDNISONE 10 MG (21) PO TBPK: 10.0000 mg | ORAL_TABLET | Freq: Every day | ORAL | Status: DC

## 2016-02-13 MED ORDER — MORPHINE SULFATE (PF) 4 MG/ML IV SOLN
4.0000 mg | Freq: Once | INTRAVENOUS | Status: AC
  Administered 2016-02-13: 4 mg via INTRAVENOUS
  Filled 2016-02-13: qty 1

## 2016-02-13 NOTE — ED Notes (Signed)
MD at bedside. 
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
## 2016-02-13 NOTE — ED Notes (Signed)
RT made aware of breathing tx. 

## 2016-02-13 NOTE — ED Notes (Addendum)
Pt oxygen 89% on RA. Pt placed on oxygen with increase to 92%.
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
## 2016-02-21 ENCOUNTER — Telehealth: Payer: Self-pay
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
## 2016-02-23 ENCOUNTER — Telehealth: Payer: Self-pay | Admitting: Physician Assistant
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
## 2016-02-23 NOTE — Telephone Encounter (Signed)
She needs to talk with Dr. Saintclair Halsted for this. I have not seen her for this problem, and she has several providers managing her for this problem.

## 2016-02-24 NOTE — Telephone Encounter (Signed)
Left message on patients vm that she should call Dr.Cram for this issue as she has not been seen by [name] for this.

## 2016-02-25 ENCOUNTER — Encounter (HOSPITAL_COMMUNITY): Payer: Self-pay | Admitting: Emergency Medicine

## 2016-02-25 ENCOUNTER — Emergency Department (HOSPITAL_COMMUNITY)
Admission: EM | Admit: 2016-02-25 | Discharge: 2016-02-25 | Disposition: A | Discharge status: Home / Self Care | Payer: BLUE CROSS/BLUE SHIELD | Attending: Emergency Medicine | Admitting: Emergency Medicine

## 2016-02-25 DIAGNOSIS — J45909 Unspecified asthma, uncomplicated: Secondary | ICD-10-CM | POA: Diagnosis not present

## 2016-02-25 DIAGNOSIS — F329 Major depressive disorder, single episode, unspecified: Secondary | ICD-10-CM | POA: Insufficient documentation

## 2016-02-25 DIAGNOSIS — F17200 Nicotine dependence, unspecified, uncomplicated: Secondary | ICD-10-CM | POA: Insufficient documentation

## 2016-02-25 DIAGNOSIS — Z79899 Other long term (current) drug therapy: Secondary | ICD-10-CM | POA: Diagnosis not present

## 2016-02-25 DIAGNOSIS — S22081D Stable burst fracture of T11-T12 vertebra, subsequent encounter for fracture with routine healing: Secondary | ICD-10-CM | POA: Diagnosis not present

## 2016-02-25 DIAGNOSIS — Z79891 Long term (current) use of opiate analgesic: Secondary | ICD-10-CM | POA: Diagnosis not present

## 2016-02-25 DIAGNOSIS — S22001D Stable burst fracture of unspecified thoracic vertebra, subsequent encounter for fracture with routine healing: Secondary | ICD-10-CM

## 2016-02-25 DIAGNOSIS — M545 Low back pain: Secondary | ICD-10-CM | POA: Insufficient documentation

## 2016-02-25 HISTORY — DX: Dysphagia, unspecified: R13.10

## 2016-02-25 HISTORY — DX: Respiratory failure, unspecified, unspecified whether with hypoxia or hypercapnia: J96.90

## 2016-02-25 HISTORY — DX: Fracture of neck, unspecified, initial encounter: S12.9XXA

## 2016-02-25 HISTORY — DX: Traumatic hemopneumothorax, initial encounter: S27.2XXA

## 2016-02-25 HISTORY — DX: Fracture of unspecified part of unspecified clavicle, initial encounter for closed fracture: S42.009A

## 2016-02-25 HISTORY — DX: Unspecified displaced fracture of third cervical vertebra, initial encounter for closed fracture: S12.200A

## 2016-02-25 HISTORY — DX: Unspecified fracture of unspecified lumbar vertebra, initial encounter for closed fracture: S32.009A

## 2016-02-25 HISTORY — DX: Stable burst fracture of t11-T12 vertebra, initial encounter for closed fracture: S22.081A

## 2016-02-25 MED ORDER — KETOROLAC TROMETHAMINE 30 MG/ML IJ SOLN
60.0000 mg | Freq: Once | INTRAMUSCULAR | Status: AC
  Administered 2016-02-25: 60 mg via INTRAMUSCULAR
  Filled 2016-02-25: qty 2

## 2016-02-25 NOTE — ED Notes (Signed)
Pt asked for a rx for fentanyl patches or rx for tramadol. MD notified.

## 2016-02-25 NOTE — ED Notes (Signed)
Pt had to be placed in waiting area due to no beds, requesting a shot for pain.  Advised pt we cannot give narcotics to people waiting.  Pt drowsy while requesting medication.  States she did not take her scheduled meds because she wanted a shot instead.
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
## 2016-02-25 NOTE — Discharge Instructions (Signed)
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
## 2016-02-25 NOTE — ED Notes (Signed)
Patient states she needs something to eat and pain medicine at this time. RN aware.

## 2016-02-26 ENCOUNTER — Encounter (HOSPITAL_COMMUNITY): Payer: Self-pay | Admitting: Emergency Medicine

## 2016-02-26 ENCOUNTER — Emergency Department (HOSPITAL_COMMUNITY)
Admission: EM | Admit: 2016-02-26 | Discharge: 2016-02-26 | Disposition: A | Discharge status: Home / Self Care | Payer: BLUE CROSS/BLUE SHIELD | Attending: Emergency Medicine | Admitting: Emergency Medicine

## 2016-02-26 DIAGNOSIS — Z791 Long term (current) use of non-steroidal anti-inflammatories (NSAID): Secondary | ICD-10-CM | POA: Diagnosis not present

## 2016-02-26 DIAGNOSIS — J45909 Unspecified asthma, uncomplicated: Secondary | ICD-10-CM | POA: Insufficient documentation

## 2016-02-26 DIAGNOSIS — R0781 Pleurodynia: Secondary | ICD-10-CM | POA: Diagnosis present

## 2016-02-26 DIAGNOSIS — F17200 Nicotine dependence, unspecified, uncomplicated: Secondary | ICD-10-CM | POA: Insufficient documentation

## 2016-02-26 DIAGNOSIS — F329 Major depressive disorder, single episode, unspecified: Secondary | ICD-10-CM | POA: Diagnosis not present

## 2016-02-26 DIAGNOSIS — Z79899 Other long term (current) drug therapy: Secondary | ICD-10-CM | POA: Diagnosis not present

## 2016-02-26 DIAGNOSIS — R52 Pain, unspecified: Secondary | ICD-10-CM

## 2016-02-26 MED ORDER — OXYCODONE-ACETAMINOPHEN 5-325 MG PO TABS: 1.0000 | ORAL_TABLET | ORAL | Status: DC | PRN

## 2016-02-26 MED ORDER — HYDROMORPHONE HCL 2 MG/ML IJ SOLN
2.0000 mg | Freq: Once | INTRAMUSCULAR | Status: AC
  Administered 2016-02-26: 2 mg via INTRAMUSCULAR
  Filled 2016-02-26: qty 1

## 2016-02-26 MED ORDER — CYCLOBENZAPRINE HCL 10 MG PO TABS: 10.0000 mg | ORAL_TABLET | Freq: Two times a day (BID) | ORAL | Status: DC | PRN

## 2016-02-26 MED ORDER — NAPROXEN 500 MG PO TABS: 500.0000 mg | ORAL_TABLET | Freq: Two times a day (BID) | ORAL | Status: DC
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
## 2016-02-26 NOTE — ED Notes (Signed)
MD at bedside. 
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
## 2016-02-26 NOTE — Discharge Instructions (Signed)
Prescription for pain medicine, muscle relaxer, anti-inflammatory medication. Follow-up your primary care doctor.

## 2016-02-26 NOTE — ED Notes (Signed)
Family at bedside. Patient request to see MD before she is discharged.

## 2016-02-27 ENCOUNTER — Telehealth (HOSPITAL_COMMUNITY): Payer: Self-pay

## 2016-02-27 ENCOUNTER — Encounter: Payer: Self-pay | Admitting: Physician Assistant
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
## 2016-03-02 ENCOUNTER — Telehealth: Payer: Self-pay

## 2016-03-02 MED ORDER — NAPROXEN 500 MG PO TABS: 500.0000 mg | ORAL_TABLET | Freq: Two times a day (BID) | ORAL | Status: DC

## 2016-03-02 MED ORDER — CYCLOBENZAPRINE HCL 10 MG PO TABS: 10.0000 mg | ORAL_TABLET | Freq: Three times a day (TID) | ORAL | Status: DC | PRN

## 2016-03-02 NOTE — Telephone Encounter (Signed)
Called in Flexeril and Naproxen to [facility] in [place] on [address].

## 2016-03-02 NOTE — Telephone Encounter (Signed)
Please see previous note regarding contacting Dr. Windy Carina office. \ Meds ordered this encounter  Medications  . cyclobenzaprine (FLEXERIL) 10 MG tablet    Sig: Take 1 tablet (10 mg total) by mouth 3 (three) times daily as needed for muscle spasms.    Dispense:  30 tablet    Refill:  0    Order Specific Question:  Supervising Provider    Answer:  DOOLITTLE, ROBERT P [id number]  . naproxen (NAPROSYN) 500 MG tablet    Sig: Take 1 tablet (500 mg total) by mouth 2 (two) times daily with a meal.    Dispense:  20 tablet    Refill:  0    Order Specific Question:  Supervising Provider    Answer:  DOOLITTLE, ROBERT P [id number]

## 2016-03-06 NOTE — Telephone Encounter (Signed)
Called Dr. Windy Carina office.  Spoke with medical records. Office note 02/20/2016 to be faxed to me.

## 2016-03-08 ENCOUNTER — Telehealth: Payer: Self-pay | Admitting: Physician Assistant

## 2016-03-08 NOTE — Telephone Encounter (Signed)
Please call Dr. Carlean Jews office and request the most recent office notes.

## 2016-03-13 ENCOUNTER — Telehealth: Payer: Self-pay
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
## 2016-03-14 ENCOUNTER — Telehealth: Payer: Self-pay

## 2016-03-14 ENCOUNTER — Other Ambulatory Visit: Payer: Self-pay | Admitting: Neurosurgery

## 2016-03-14 DIAGNOSIS — F3281 Premenstrual dysphoric disorder: Secondary | ICD-10-CM

## 2016-03-14 DIAGNOSIS — S22081A Stable burst fracture of T11-T12 vertebra, initial encounter for closed fracture: Secondary | ICD-10-CM

## 2016-03-14 MED ORDER — HYDROCODONE-ACETAMINOPHEN 5-325 MG PO TABS: 1.0000 | ORAL_TABLET | Freq: Three times a day (TID) | ORAL | Status: DC | PRN

## 2016-03-14 MED ORDER — LIDOCAINE 5 % EX PTCH: 2.0000 | MEDICATED_PATCH | CUTANEOUS | Status: DC

## 2016-03-14 NOTE — Telephone Encounter (Signed)
Pt is wanting Barbara Rogers to call dr handy to get the information over the phone because she will be in town today only and doesn't have a ride any other way to get her meds

## 2016-03-14 NOTE — Telephone Encounter (Signed)
Patient brought in Dr. Carlean Jews OV from 02/22/2016. No pain medications prescribed. She was advised to discuss with Dr. Saintclair Halsted (as he performed the surgery) or PCP (for the chronic pain).  Spoke with Dr. Windy Carina office.  She was just seen there this morning.  Pain assessment: "patient is taking medications as prescribed." Progressing well with ROM.  Re-evaluate in 1 month (8/15) with CT. "I am not prescribing her anything now or ever again."  This patient is high risk due to concomitant benzodiazepine use. She has been advised against use of NSAIDS, as they can slow the bone healing process. She is on maximum gabapentin dose (800 mg QID), though Lyrica is also on her list. It appears that she is no longer using lidocaine patches.Marland Kitchen  [place] reviewed.  All dispenses are known to me through patient report and chart review, most recently oxycodone-acetaminophen 5-325 #20 from the ED on 6/21.  Meds ordered this encounter  Medications  . HYDROcodone-acetaminophen (NORCO/VICODIN) 5-325 MG tablet    Sig: Take 1 tablet by mouth every 8 (eight) hours as needed. Reported on 02/26/2016    Dispense:  30 tablet    Refill:  0    Order Specific Question:  Supervising Provider    Answer:  DOOLITTLE, ROBERT P [id number]  . lidocaine (LIDODERM) 5 %    Sig: Place 2 patches onto the skin daily. Remove and discard patch within 12 hours.    Dispense:  180 patch    Refill:  0    Order Specific Question:  Supervising Provider    Answer:  DOOLITTLE, ROBERT P [id number]    When I brought the prescription to the front desk, she had left, asking that the prescription be mailed to her.  PLEASE CALL THIS PATIENT. OK to mail the hydrocodone Rx, but if it is lost in the mail, will not be replaced.  She needs to resume the lidocaine patches, and I have sent a new prescription. She needs to be taking EITHER the gabapentin or Lyrica, but not both.  I will need to see her for a visit before I prescribe any additional pain  medications.

## 2016-03-14 NOTE — Telephone Encounter (Signed)
Spoke to pt and gave her all instr's from [place]. Pt agreed and stated she will try to get someone to pick up her Rx instead of mailing it, but will call back if we need to mail afterall.

## 2016-03-14 NOTE — Telephone Encounter (Signed)
Contacted pt and advised her of all of [name]'s instructions in her 7/6 notes. Pt stated that she will try to get someone to pick up her Rx, but will call back if we need to mail it.

## 2016-03-15 NOTE — Telephone Encounter (Signed)
Fax received from Dr. Marcelino Scot. Patient also brought a copy.

## 2016-03-26 ENCOUNTER — Encounter: Payer: Self-pay | Admitting: Physician Assistant

## 2016-03-26 ENCOUNTER — Ambulatory Visit (INDEPENDENT_AMBULATORY_CARE_PROVIDER_SITE_OTHER): Payer: BLUE CROSS/BLUE SHIELD | Admitting: Physician Assistant

## 2016-03-26 VITALS — BP 90/70 | HR 99 | Temp 98.1°F | Resp 18 | Ht 66.5 in | Wt 162.0 lb

## 2016-03-26 DIAGNOSIS — R748 Abnormal levels of other serum enzymes: Secondary | ICD-10-CM | POA: Diagnosis not present

## 2016-03-26 DIAGNOSIS — S12201D Unspecified nondisplaced fracture of third cervical vertebra, subsequent encounter for fracture with routine healing: Secondary | ICD-10-CM | POA: Diagnosis not present

## 2016-03-26 DIAGNOSIS — M25512 Pain in left shoulder: Secondary | ICD-10-CM | POA: Diagnosis not present

## 2016-03-26 DIAGNOSIS — F4323 Adjustment disorder with mixed anxiety and depressed mood: Secondary | ICD-10-CM | POA: Diagnosis not present

## 2016-03-26 DIAGNOSIS — N946 Dysmenorrhea, unspecified: Secondary | ICD-10-CM

## 2016-03-26 DIAGNOSIS — G8929 Other chronic pain: Secondary | ICD-10-CM

## 2016-03-26 DIAGNOSIS — Z79891 Long term (current) use of opiate analgesic: Secondary | ICD-10-CM

## 2016-03-26 DIAGNOSIS — Z79899 Other long term (current) drug therapy: Secondary | ICD-10-CM | POA: Diagnosis not present

## 2016-03-26 DIAGNOSIS — S32008D Other fracture of unspecified lumbar vertebra, subsequent encounter for fracture with routine healing: Secondary | ICD-10-CM

## 2016-03-26 DIAGNOSIS — S2243XD Multiple fractures of ribs, bilateral, subsequent encounter for fracture with routine healing: Secondary | ICD-10-CM | POA: Diagnosis not present

## 2016-03-26 DIAGNOSIS — S42002D Fracture of unspecified part of left clavicle, subsequent encounter for fracture with routine healing: Secondary | ICD-10-CM | POA: Diagnosis not present

## 2016-03-26 DIAGNOSIS — S22081D Stable burst fracture of T11-T12 vertebra, subsequent encounter for fracture with routine healing: Secondary | ICD-10-CM

## 2016-03-26 MED ORDER — OXYCODONE HCL 10 MG PO TABS: 10.0000 mg | ORAL_TABLET | Freq: Three times a day (TID) | ORAL | Status: DC

## 2016-03-26 NOTE — Patient Instructions (Addendum)
Please ask Dr. Toy Care if Cymbalta (duloxetine) would be an appropriate medication for you. It is an antidepressant that has also shown to help nerve pain. It may be helpful during this time when you have so much more pain.  We need to be more aggressive with addressing your pain in ways that can minimize the need for so much pain medication.     IF you received an x-ray today, you will receive an invoice from [facility]. Please contact [facility] at [phone] with questions or concerns regarding your invoice.   IF you received labwork today, you will receive an invoice from [employer]. Please contact [facility] at [phone] with questions or concerns regarding your invoice.   Our billing staff will not be able to assist you with questions regarding bills from these companies.  You will be contacted with the lab results as soon as they are available. The fastest way to get your results is to activate your My Chart account. Instructions are located on the last page of this paperwork. If you have not heard from [place] regarding the results in 2 weeks, please contact this office.    [facility] in [place] [id number]  [phone]  Rosette Reveal [facility]  Psychologist  [place]  [phone]  Dr. Alford Highland Schneidmiller,  Psychologist  [place]  [phone]  [place]  [phone]  Essie Hart. Linna Hoff, MA  Psychologist  [place]  [phone]
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
## 2016-03-27 LAB — COMPREHENSIVE METABOLIC PANEL
ALBUMIN: 4.1 g/dL (ref 3.6–5.1)
ALK PHOS: 136 U/L — AB (ref 33–115)
ALT: 11 U/L (ref 6–29)
AST: 16 U/L (ref 10–30)
BILIRUBIN TOTAL: 0.2 mg/dL (ref 0.2–1.2)
BUN: 7 mg/dL (ref 7–25)
CALCIUM: 9.4 mg/dL (ref 8.6–10.2)
CO2: 26 mmol/L (ref 20–31)
CREATININE: 0.74 mg/dL (ref 0.50–1.10)
Chloride: 101 mmol/L (ref 98–110)
GLUCOSE: 90 mg/dL (ref 65–99)
Potassium: 4.3 mmol/L (ref 3.5–5.3)
SODIUM: 139 mmol/L (ref 135–146)
Total Protein: 7 g/dL (ref 6.1–8.1)

## 2016-03-31 LAB — PAIN MGMT PROF 3 W/CONF W/O MM, U
AMINOCLONAZEPAM: NEGATIVE ng/mL (ref <25)
Alphahydroxyalprazolam: 2902 ng/mL — ABNORMAL HIGH (ref <25)
Alphahydroxymidazolam: NEGATIVE ng/mL (ref <50)
Alphahydroxytriazolam: NEGATIVE ng/mL (ref <50)
Amphetamines: NEGATIVE ng/mL (ref <500)
BENZODIAZEPINES: POSITIVE ng/mL — AB (ref <100)
Benzoylecgonine: 46533 ng/mL — ABNORMAL HIGH (ref <100)
COCAINE METABOLITE: POSITIVE ng/mL — AB (ref <150)
CREATININE: 200.9 mg/dL (ref >=20.0)
HYDROXYETHYLFLURAZEPAM: NEGATIVE ng/mL (ref <50)
LORAZEPAM: NEGATIVE ng/mL (ref <50)
Marijuana Metabolite: 597 ng/mL — ABNORMAL HIGH (ref <5)
Marijuana Metabolite: POSITIVE ng/mL — AB (ref <20)
Nordiazepam: NEGATIVE ng/mL (ref <50)
OXAZEPAM: NEGATIVE ng/mL (ref <50)
OXIDANT: NEGATIVE ug/mL (ref <200)
OXYCODONE: NEGATIVE ng/mL (ref <100)
Opiates: NEGATIVE ng/mL (ref <100)
PH: 7.71 (ref 4.5–9.0)
TEMAZEPAM: NEGATIVE ng/mL (ref <50)
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
## 2016-04-23 ENCOUNTER — Ambulatory Visit
Admission: RE | Admit: 2016-04-23 | Discharge: 2016-04-23 | Disposition: A | Discharge status: Home / Self Care | Payer: BLUE CROSS/BLUE SHIELD | Source: Ambulatory Visit | Attending: Neurosurgery | Admitting: Neurosurgery

## 2016-04-23 DIAGNOSIS — S22081A Stable burst fracture of T11-T12 vertebra, initial encounter for closed fracture: Secondary | ICD-10-CM

## 2016-04-25 ENCOUNTER — Telehealth: Payer: Self-pay

## 2016-04-25 DIAGNOSIS — S22081D Stable burst fracture of T11-T12 vertebra, subsequent encounter for fracture with routine healing: Secondary | ICD-10-CM

## 2016-04-25 NOTE — Telephone Encounter (Signed)
Patient request to leave a message for [name]. Patient request for home physical therapy. Patient doesn't have transportation right now. Patient want chelle to recommend PT to come to her house. [phone].

## 2016-04-26 NOTE — Telephone Encounter (Signed)
Orders Placed This Encounter  Procedures   Ambulatory referral to Home Health    Referral Priority:   Routine    Referral Type:   Home Health Care    Referral Reason:   Specialty Services Required    Requested Specialty:   Home Health Services    Number of Visits Requested:   1    

## 2016-05-29 ENCOUNTER — Emergency Department (HOSPITAL_COMMUNITY): Payer: BLUE CROSS/BLUE SHIELD

## 2016-05-29 ENCOUNTER — Encounter (HOSPITAL_COMMUNITY): Payer: Self-pay | Admitting: Emergency Medicine

## 2016-05-29 ENCOUNTER — Emergency Department (HOSPITAL_COMMUNITY)
Admission: EM | Admit: 2016-05-29 | Discharge: 2016-05-29 | Disposition: A | Discharge status: Home / Self Care | Payer: BLUE CROSS/BLUE SHIELD | Attending: Emergency Medicine | Admitting: Emergency Medicine

## 2016-05-29 DIAGNOSIS — J45909 Unspecified asthma, uncomplicated: Secondary | ICD-10-CM | POA: Insufficient documentation

## 2016-05-29 DIAGNOSIS — S299XXA Unspecified injury of thorax, initial encounter: Secondary | ICD-10-CM | POA: Diagnosis present

## 2016-05-29 DIAGNOSIS — S2231XA Fracture of one rib, right side, initial encounter for closed fracture: Secondary | ICD-10-CM | POA: Diagnosis not present

## 2016-05-29 DIAGNOSIS — Y939 Activity, unspecified: Secondary | ICD-10-CM | POA: Insufficient documentation

## 2016-05-29 DIAGNOSIS — F17210 Nicotine dependence, cigarettes, uncomplicated: Secondary | ICD-10-CM | POA: Insufficient documentation

## 2016-05-29 DIAGNOSIS — W19XXXA Unspecified fall, initial encounter: Secondary | ICD-10-CM

## 2016-05-29 DIAGNOSIS — W010XXA Fall on same level from slipping, tripping and stumbling without subsequent striking against object, initial encounter: Secondary | ICD-10-CM | POA: Diagnosis not present

## 2016-05-29 DIAGNOSIS — Z79899 Other long term (current) drug therapy: Secondary | ICD-10-CM | POA: Insufficient documentation

## 2016-05-29 DIAGNOSIS — Y929 Unspecified place or not applicable: Secondary | ICD-10-CM | POA: Diagnosis not present

## 2016-05-29 DIAGNOSIS — Y999 Unspecified external cause status: Secondary | ICD-10-CM | POA: Diagnosis not present

## 2016-05-29 MED ORDER — HYDROCODONE-ACETAMINOPHEN 5-325 MG PO TABS
1.0000 | ORAL_TABLET | Freq: Once | ORAL | Status: AC
  Administered 2016-05-29: 1 via ORAL
  Filled 2016-05-29: qty 1

## 2016-05-29 MED ORDER — HYDROCODONE-ACETAMINOPHEN 5-325 MG PO TABS: 1.0000 | ORAL_TABLET | Freq: Four times a day (QID) | ORAL | 0 refills | Status: DC | PRN
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
## 2016-05-29 NOTE — Discharge Instructions (Signed)
Follow-up with her doctor next week for recheck °

## 2016-05-31 ENCOUNTER — Encounter: Payer: Self-pay | Admitting: Physician Assistant

## 2016-05-31 ENCOUNTER — Telehealth (HOSPITAL_COMMUNITY): Payer: Self-pay
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
## 2016-06-10 ENCOUNTER — Encounter: Payer: Self-pay | Admitting: Physician Assistant

## 2016-06-19 ENCOUNTER — Encounter (HOSPITAL_COMMUNITY): Payer: Self-pay | Admitting: Physical Therapy

## 2016-06-19 ENCOUNTER — Ambulatory Visit (HOSPITAL_COMMUNITY): Payer: BLUE CROSS/BLUE SHIELD | Attending: Neurosurgery | Admitting: Physical Therapy

## 2016-06-19 DIAGNOSIS — M6281 Muscle weakness (generalized): Secondary | ICD-10-CM

## 2016-06-19 DIAGNOSIS — M545 Low back pain: Secondary | ICD-10-CM | POA: Diagnosis present

## 2016-06-19 DIAGNOSIS — M62830 Muscle spasm of back: Secondary | ICD-10-CM | POA: Diagnosis present

## 2016-06-19 DIAGNOSIS — R293 Abnormal posture: Secondary | ICD-10-CM | POA: Diagnosis present
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
## 2016-06-26 ENCOUNTER — Encounter (HOSPITAL_COMMUNITY): Payer: Self-pay | Admitting: *Deleted

## 2016-06-26 ENCOUNTER — Ambulatory Visit (HOSPITAL_COMMUNITY): Payer: BLUE CROSS/BLUE SHIELD

## 2016-06-26 ENCOUNTER — Telehealth (HOSPITAL_COMMUNITY): Payer: Self-pay

## 2016-06-26 ENCOUNTER — Observation Stay (HOSPITAL_COMMUNITY)
Admission: EM | Admit: 2016-06-26 | Discharge: 2016-06-27 | Disposition: A | Discharge status: Other Acute Inpatient Hospital | Payer: BLUE CROSS/BLUE SHIELD | Attending: Internal Medicine | Admitting: Internal Medicine

## 2016-06-26 DIAGNOSIS — Z23 Encounter for immunization: Secondary | ICD-10-CM | POA: Diagnosis not present

## 2016-06-26 DIAGNOSIS — R45851 Suicidal ideations: Secondary | ICD-10-CM

## 2016-06-26 DIAGNOSIS — F17210 Nicotine dependence, cigarettes, uncomplicated: Secondary | ICD-10-CM | POA: Diagnosis not present

## 2016-06-26 DIAGNOSIS — R400 Somnolence: Secondary | ICD-10-CM

## 2016-06-26 DIAGNOSIS — Z79899 Other long term (current) drug therapy: Secondary | ICD-10-CM | POA: Diagnosis not present

## 2016-06-26 DIAGNOSIS — Z791 Long term (current) use of non-steroidal anti-inflammatories (NSAID): Secondary | ICD-10-CM | POA: Diagnosis not present

## 2016-06-26 DIAGNOSIS — G894 Chronic pain syndrome: Secondary | ICD-10-CM | POA: Diagnosis not present

## 2016-06-26 DIAGNOSIS — E039 Hypothyroidism, unspecified: Secondary | ICD-10-CM | POA: Diagnosis present

## 2016-06-26 DIAGNOSIS — T510X2A Toxic effect of ethanol, intentional self-harm, initial encounter: Secondary | ICD-10-CM | POA: Diagnosis not present

## 2016-06-26 DIAGNOSIS — T50902A Poisoning by unspecified drugs, medicaments and biological substances, intentional self-harm, initial encounter: Secondary | ICD-10-CM

## 2016-06-26 DIAGNOSIS — R001 Bradycardia, unspecified: Secondary | ICD-10-CM

## 2016-06-26 DIAGNOSIS — J45909 Unspecified asthma, uncomplicated: Secondary | ICD-10-CM | POA: Diagnosis not present

## 2016-06-26 DIAGNOSIS — T465X1A Poisoning by other antihypertensive drugs, accidental (unintentional), initial encounter: Secondary | ICD-10-CM

## 2016-06-26 DIAGNOSIS — T465X2A Poisoning by other antihypertensive drugs, intentional self-harm, initial encounter: Secondary | ICD-10-CM | POA: Diagnosis not present

## 2016-06-26 DIAGNOSIS — F333 Major depressive disorder, recurrent, severe with psychotic symptoms: Secondary | ICD-10-CM | POA: Diagnosis not present

## 2016-06-26 DIAGNOSIS — R4182 Altered mental status, unspecified: Secondary | ICD-10-CM | POA: Diagnosis not present

## 2016-06-26 DIAGNOSIS — E876 Hypokalemia: Secondary | ICD-10-CM

## 2016-06-26 HISTORY — DX: Poisoning by unspecified drugs, medicaments and biological substances, intentional self-harm, initial encounter: T50.902A

## 2016-06-26 LAB — RAPID URINE DRUG SCREEN, HOSP PERFORMED
Amphetamines: NOT DETECTED
BARBITURATES: NOT DETECTED
BENZODIAZEPINES: POSITIVE — AB
COCAINE: NOT DETECTED
OPIATES: POSITIVE — AB
TETRAHYDROCANNABINOL: POSITIVE — AB

## 2016-06-26 LAB — COMPREHENSIVE METABOLIC PANEL
ALBUMIN: 4 g/dL (ref 3.5–5.0)
ALK PHOS: 108 U/L (ref 38–126)
ALT: 14 U/L (ref 14–54)
ANION GAP: 10 (ref 5–15)
AST: 15 U/L (ref 15–41)
BUN: 10 mg/dL (ref 6–20)
CALCIUM: 8.8 mg/dL — AB (ref 8.9–10.3)
CHLORIDE: 109 mmol/L (ref 101–111)
CO2: 20 mmol/L — AB (ref 22–32)
Creatinine, Ser: 0.64 mg/dL (ref 0.44–1.00)
GFR calc non Af Amer: >60 mL/min (ref >60)
GLUCOSE: 197 mg/dL — AB (ref 65–99)
Potassium: 3.1 mmol/L — ABNORMAL LOW (ref 3.5–5.1)
SODIUM: 139 mmol/L (ref 135–145)
Total Bilirubin: 0.5 mg/dL (ref 0.3–1.2)
Total Protein: 7.2 g/dL (ref 6.5–8.1)

## 2016-06-26 LAB — POTASSIUM: POTASSIUM: 3.9 mmol/L (ref 3.5–5.1)

## 2016-06-26 LAB — CBG MONITORING, ED
GLUCOSE-CAPILLARY: 210 mg/dL — AB (ref 65–99)
GLUCOSE-CAPILLARY: 140 mg/dL — AB (ref 65–99)

## 2016-06-26 LAB — URINE MICROSCOPIC-ADD ON
Bacteria, UA: NONE SEEN
Squamous Epithelial / LPF: NONE SEEN
WBC UA: NONE SEEN WBC/hpf (ref 0–5)

## 2016-06-26 LAB — CBC WITH DIFFERENTIAL/PLATELET
Basophils Absolute: 0 10*3/uL (ref 0.0–0.1)
Basophils Relative: 0 %
EOS ABS: 0.1 10*3/uL (ref 0.0–0.7)
Eosinophils Relative: 1 %
HEMATOCRIT: 42.5 % (ref 36.0–46.0)
HEMOGLOBIN: 14.6 g/dL (ref 12.0–15.0)
LYMPHS ABS: 3.1 10*3/uL (ref 0.7–4.0)
LYMPHS PCT: 29 %
MCH: 29.8 pg (ref 26.0–34.0)
MCHC: 34.4 g/dL (ref 30.0–36.0)
MCV: 86.7 fL (ref 78.0–100.0)
MONOS PCT: 7 %
Monocytes Absolute: 0.8 10*3/uL (ref 0.1–1.0)
NEUTROS ABS: 6.7 10*3/uL (ref 1.7–7.7)
NEUTROS PCT: 63 %
Platelets: 277 10*3/uL (ref 150–400)
RBC: 4.9 MIL/uL (ref 3.87–5.11)
RDW: 15.8 % — ABNORMAL HIGH (ref 11.5–15.5)
WBC: 10.7 10*3/uL — AB (ref 4.0–10.5)

## 2016-06-26 LAB — ACETAMINOPHEN LEVEL

## 2016-06-26 LAB — URINALYSIS, ROUTINE W REFLEX MICROSCOPIC
Bilirubin Urine: NEGATIVE
GLUCOSE, UA: NEGATIVE mg/dL
LEUKOCYTES UA: NEGATIVE
NITRITE: NEGATIVE
PH: 6 (ref 5.0–8.0)
Protein, ur: NEGATIVE mg/dL
Specific Gravity, Urine: 1.02 (ref 1.005–1.030)

## 2016-06-26 LAB — TROPONIN I: Troponin I: <0.03 ng/mL (ref <0.03)

## 2016-06-26 LAB — PREGNANCY, URINE: Preg Test, Ur: NEGATIVE

## 2016-06-26 LAB — ETHANOL: Alcohol, Ethyl (B): 69 mg/dL — ABNORMAL HIGH (ref <5)

## 2016-06-26 LAB — TSH: TSH: 1.045 u[IU]/mL (ref 0.350–4.500)

## 2016-06-26 LAB — MAGNESIUM: MAGNESIUM: 1.8 mg/dL (ref 1.7–2.4)

## 2016-06-26 LAB — SALICYLATE LEVEL: Salicylate Lvl: <7 mg/dL (ref 2.8–30.0)

## 2016-06-26 MED ORDER — INFLUENZA VAC SPLIT QUAD 0.5 ML IM SUSY
0.5000 mL | PREFILLED_SYRINGE | INTRAMUSCULAR | Status: AC
  Administered 2016-06-27: 0.5 mL via INTRAMUSCULAR
  Filled 2016-06-26: qty 0.5

## 2016-06-26 MED ORDER — ONDANSETRON HCL 4 MG PO TABS: 4.0000 mg | ORAL_TABLET | Freq: Four times a day (QID) | ORAL | Status: DC | PRN

## 2016-06-26 MED ORDER — SODIUM CHLORIDE 0.9 % IV BOLUS (SEPSIS)
1000.0000 mL | Freq: Once | INTRAVENOUS | Status: AC
  Administered 2016-06-26: 1000 mL via INTRAVENOUS

## 2016-06-26 MED ORDER — NAPROXEN 250 MG PO TABS
500.0000 mg | ORAL_TABLET | Freq: Two times a day (BID) | ORAL | Status: DC
  Administered 2016-06-26 – 2016-06-27 (×2): 500 mg via ORAL
  Filled 2016-06-26 (×2): qty 2

## 2016-06-26 MED ORDER — LIDOCAINE 5 % EX PTCH
2.0000 | MEDICATED_PATCH | CUTANEOUS | Status: DC
  Administered 2016-06-26: 2 via TRANSDERMAL
  Filled 2016-06-26 (×2): qty 2

## 2016-06-26 MED ORDER — ENOXAPARIN SODIUM 40 MG/0.4ML ~~LOC~~ SOLN
40.0000 mg | SUBCUTANEOUS | Status: DC
  Administered 2016-06-26: 40 mg via SUBCUTANEOUS
  Filled 2016-06-26: qty 0.4

## 2016-06-26 MED ORDER — ACETAMINOPHEN 325 MG PO TABS
650.0000 mg | ORAL_TABLET | Freq: Four times a day (QID) | ORAL | Status: DC | PRN
  Administered 2016-06-27: 650 mg via ORAL
  Filled 2016-06-26: qty 2

## 2016-06-26 MED ORDER — LIDOCAINE 5 % EX PTCH
MEDICATED_PATCH | CUTANEOUS | Status: AC
  Filled 2016-06-26: qty 2

## 2016-06-26 MED ORDER — ALPRAZOLAM 0.5 MG PO TABS
0.5000 mg | ORAL_TABLET | Freq: Three times a day (TID) | ORAL | Status: DC | PRN
  Administered 2016-06-26 – 2016-06-27 (×2): 0.5 mg via ORAL
  Filled 2016-06-26 (×2): qty 1

## 2016-06-26 MED ORDER — SODIUM CHLORIDE 0.9 % IV SOLN
INTRAVENOUS | Status: AC
  Administered 2016-06-26: 18:00:00 via INTRAVENOUS

## 2016-06-26 MED ORDER — SODIUM CHLORIDE 0.9 % IV BOLUS (SEPSIS)
500.0000 mL | Freq: Once | INTRAVENOUS | Status: AC
  Administered 2016-06-26: 500 mL via INTRAVENOUS

## 2016-06-26 MED ORDER — HYDROCODONE-ACETAMINOPHEN 5-325 MG PO TABS
1.0000 | ORAL_TABLET | Freq: Four times a day (QID) | ORAL | Status: DC | PRN
  Administered 2016-06-26 – 2016-06-27 (×3): 1 via ORAL
  Filled 2016-06-26 (×3): qty 1

## 2016-06-26 MED ORDER — ONDANSETRON HCL 4 MG/2ML IJ SOLN: 4.0000 mg | Freq: Four times a day (QID) | INTRAMUSCULAR | Status: DC | PRN

## 2016-06-26 MED ORDER — POTASSIUM CHLORIDE IN NACL 40-0.9 MEQ/L-% IV SOLN
INTRAVENOUS | Status: DC
  Administered 2016-06-26 – 2016-06-27 (×2): 100 mL/h via INTRAVENOUS

## 2016-06-26 MED ORDER — ACETAMINOPHEN 650 MG RE SUPP: 650.0000 mg | Freq: Four times a day (QID) | RECTAL | Status: DC | PRN

## 2016-06-26 MED ORDER — NALOXONE HCL 2 MG/2ML IJ SOSY
1.0000 mg | PREFILLED_SYRINGE | Freq: Once | INTRAMUSCULAR | Status: AC
  Administered 2016-06-26: 1 mg via INTRAVENOUS
  Filled 2016-06-26: qty 2

## 2016-06-26 MED ORDER — AMPHETAMINE-DEXTROAMPHETAMINE 10 MG PO TABS
20.0000 mg | ORAL_TABLET | Freq: Three times a day (TID) | ORAL | Status: DC
  Administered 2016-06-27 (×2): 20 mg via ORAL
  Filled 2016-06-26 (×2): qty 2

## 2016-06-26 MED ORDER — NICOTINE 21 MG/24HR TD PT24
21.0000 mg | MEDICATED_PATCH | Freq: Every day | TRANSDERMAL | Status: DC
  Administered 2016-06-26 – 2016-06-27 (×2): 21 mg via TRANSDERMAL
  Filled 2016-06-26 (×2): qty 1

## 2016-06-26 MED ORDER — POTASSIUM CHLORIDE 10 MEQ/100ML IV SOLN
10.0000 meq | INTRAVENOUS | Status: AC
  Administered 2016-06-26 (×4): 10 meq via INTRAVENOUS
  Filled 2016-06-26 (×3): qty 100

## 2016-06-26 MED ORDER — ATROPINE SULFATE 1 MG/ML IJ SOLN
1.0000 mg | Freq: Once | INTRAMUSCULAR | Status: AC
  Administered 2016-06-26: 1 mg via INTRAVENOUS
  Filled 2016-06-26: qty 1

## 2016-06-26 MED ORDER — LEVOTHYROXINE SODIUM 25 MCG PO TABS
50.0000 ug | ORAL_TABLET | Freq: Every day | ORAL | Status: DC
  Administered 2016-06-27: 50 ug via ORAL
  Filled 2016-06-26: qty 2

## 2016-06-26 NOTE — Telephone Encounter (Signed)
Noted pt. admitted in ER earlier today.  Called and left message checking on pt and reminded next apt time with contact info given.    [address], [place]; CBIS [phone]
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
## 2016-06-26 NOTE — ED Notes (Signed)
Spoke with poison control. They recommend a repeat EKG and Potassium redraw.

## 2016-06-26 NOTE — ED Notes (Signed)
Spoke to [place] at [facility] and gave vitals and updated on pt status. Alyse Low will call back around 2 to get update on pt.

## 2016-06-26 NOTE — ED Notes (Signed)
Dr. Thurnell Garbe notified of low hr 48 that will rise to 60's and back down to 48-50hr.
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
## 2016-06-26 NOTE — ED Provider Notes (Signed)
0800:  Pt received at sign out. Pt s/p intentional OD on #180 clonidine + drank etoh. Poison control contacted: recommends observation 6-8 hours and re-assess. Pt currently lethargic, but will open eyes to command, NAD, resps easy.   1400:  BP stable. HR 40-50's while asleep, increases to 60's when awakened. Pt continues lethargic. Will admit to medicine service. Pt will need psych eval after medical clearance. IVC paperwork completed.  T/C to [facility] Dr. Roderic Palau, case discussed, including:  HPI, pertinent PM/SHx, VS/PE, dx testing, ED course and treatment:  Agreeable to admit, requests to write temporary orders, obtain observation stepdown bed to team APAdmits.    Francine Graven, DO 06/26/16 1429

## 2016-06-26 NOTE — H&P (Signed)
History and Physical    Barbara Rogers [id number] DOB: 1980/12/02 DOA: 06/26/2016  PCP: Harrison Mons, PA-C  Patient coming from: home  Chief Complaint: overdose  HPI: Barbara Rogers is a 35 y.o. female with medical history significant of chronic pain, hypothyroidism, mental illness who reports taking approximately 120 tablets of clonidine on 10/16. She is unclear with her dates. She reports that she overdosed on clonidine since she was "tired of living". She reports having worsening depression since she lost her grandfather earlier this year. She reports that her parents have already passed on and she does not have any children. She also has chronic pain, and did not want to be in any more pain. Denies any shortness of breath, cough, fever, nausea, vomiting, diarrhea, chest pain..  ED Course: She was evaluated in emergency room where she was noted to be somnolent. Heart rate remained in the low 40s, sinus bradycardia. Blood work was unremarkable other than mildly low potassium. Case was discussed with poison control who recommended observation. Blood pressure is currently stable. She received Narcan in the emergency room without significant benefit.  Review of Systems: As per HPI otherwise 10 point review of systems negative.    Past Medical History:  Diagnosis Date  . AKI (acute kidney injury) ([place]) 01/18/2015  . Allergy   . Anxiety   . Asthma    exacerbated by bronchitisi  . Asthma   . C3 cervical fracture ([place])   . Cervical transverse process fracture ([place])   . Chronic left shoulder pain 08/2012  . Clavicle fracture   . Concussion 01/29/2016  . Depression   . 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 Dysmenorrhea   . Dysphagia   . Finger fracture, left 10/31/2012   LEFT 4th finger  . Insomnia   . Lumbar transverse process fracture ([place])   . Migraine without aura   . Mood disorder ([place])   . Nephrolithiasis   . PMDD (premenstrual dysphoric disorder)   .  Respiratory failure ([place])   . T12 burst fracture ([place])   . Traumatic hemo-pneumothorax   . Traumatic hemopneumothorax 02/09/2016    Past Surgical History:  Procedure Laterality Date  . FRACTURE SURGERY    . POSTERIOR LUMBAR FUSION 4 LEVEL N/A 01/30/2016   Procedure: T9-L2 Posterior Stabilization, Posterior Lumbar Fusion with Pedicle Screws;  Surgeon: Kary Kos, MD;  Location: [facility];  Service: Neurosurgery;  Laterality: N/A;     reports that she has been smoking Cigarettes.  She has been smoking about 1.00 pack per day. She has never used smokeless tobacco. She reports that she drinks alcohol. She reports that she does not use drugs.  Allergies  Allergen Reactions  . Penicillins Other (See Comments)      . Progestins     Extreme moodiness  . Progestins Other (See Comments)    Extreme moodiness  . Sinequan [Doxepin] Other (See Comments) and Hypertension    Hallucinations, delusions, severe agitation  . Sinequan [Doxepin] Other (See Comments)    hallucinations    Family History  Problem Relation Age of Onset  . Migraines Mother   . Mental illness Mother   . Heart disease Father   . Alcohol abuse Father   . Arthritis Maternal Grandmother   . Thyroid disease Maternal Grandmother   . Heart disease Maternal Grandfather     AMI 1996, 2014  . Anemia Maternal Grandfather     bone marrow dysfunction    Prior to Admission medications  Medication Sig Start Date End Date Taking? Authorizing Provider  ALPRAZolam Duanne Moron) 1 MG tablet Take 1 tablet (1 mg total) by mouth See admin instructions. Take 1 tablet (1 mg) by mouth up to 6 times daily as needed for anxiety Patient taking differently: Take 1 mg by mouth every 4 (four) hours as needed for anxiety. Take 1 tablet (1 mg) by mouth up to 6 times daily as needed for anxiety 02/09/16  Yes Lisette Abu, PA-C  amphetamine-dextroamphetamine (ADDERALL) 20 MG tablet Take 20 mg by mouth 3 (three) times daily.   Yes Historical Provider,  MD  cyclobenzaprine (FLEXERIL) 10 MG tablet Take 1 tablet (10 mg total) by mouth 3 (three) times daily as needed for muscle spasms. 03/02/16  Yes [name] Jeffery, PA-C  HYDROcodone-acetaminophen (NORCO/VICODIN) 5-325 MG tablet Take 1 tablet by mouth every 6 (six) hours as needed for moderate pain. Patient taking differently: Take 2 tablets by mouth daily.  05/29/16  Yes Milton Ferguson, MD  lidocaine (LIDODERM) 5 % Place 2 patches onto the skin daily. Remove and discard patch within 12 hours. 03/14/16  Yes [name] Jeffery, PA-C  pregabalin (LYRICA) 200 MG capsule Take 1 capsule (200 mg total) by mouth 3 (three) times daily. 01/18/16  Yes [name] Jeffery, PA-C  zolpidem (AMBIEN) 10 MG tablet Take 10 mg by mouth at bedtime.   Yes Historical Provider, MD  albuterol (PROVENTIL HFA;VENTOLIN HFA) 108 (90 Base) MCG/ACT inhaler Inhale 2 puffs into the lungs every 4 (four) hours as needed for wheezing or shortness of breath (cough, shortness of breath or wheezing.). 10/24/15   [name] Jeffery, PA-C  cloNIDine (CATAPRES) 0.1 MG tablet 1 tablet daily. 04/17/16   Historical Provider, MD  ibuprofen (ADVIL,MOTRIN) 400 MG tablet 1 tablet daily as needed. 06/24/16   Historical Provider, MD  levothyroxine (SYNTHROID, LEVOTHROID) 50 MCG tablet Take 1 tablet (50 mcg total) by mouth daily Patient taking differently: Take 25-50 mcg by mouth daily before breakfast. Take 1 tablet (50 mcg total) by mouth daily 09/07/15   [name] Jeffery, PA-C  mometasone (NASONEX) 50 MCG/ACT nasal spray Place 2 sprays into the nose daily. 12/07/15   [name] Jeffery, PA-C  naproxen (NAPROSYN) 500 MG tablet Take 1 tablet (500 mg total) by mouth 2 (two) times daily with a meal. 03/02/16   [name] Jeffery, PA-C  ondansetron (ZOFRAN) 8 MG tablet Take 8 mg by mouth daily as needed for nausea or vomiting. Reported on 03/26/2016    Historical Provider, MD    Physical Exam: Vitals:   06/26/16 1600 06/26/16 1629 06/26/16 1630 06/26/16 1728  BP: 136/88 136/93 136/93    Pulse:  (!) 40    Resp: 18 26 18    Temp:      TempSrc:      SpO2:  95%    Weight:    76.5 kg (168 lb 10.4 oz)  Height:    5\' 9"  (1.753 m)      Constitutional: NAD, calm, comfortable Vitals:   06/26/16 1600 06/26/16 1629 06/26/16 1630 06/26/16 1728  BP: 136/88 136/93 136/93   Pulse:  (!) 40    Resp: 18 26 18    Temp:      TempSrc:      SpO2:  95%    Weight:    76.5 kg (168 lb 10.4 oz)  Height:    5\' 9"  (1.753 m)   Eyes: PERRL, lids and conjunctivae normal ENMT: Mucous membranes are moist. Posterior pharynx clear of any exudate or lesions.Normal dentition.  Neck: normal,  supple, no masses, no thyromegaly Respiratory: clear to auscultation bilaterally, no wheezing, no crackles. Normal respiratory effort. No accessory muscle use.  Cardiovascular: bradycardic, regular. No extremity edema. 2+ pedal pulses. No carotid bruits.  Abdomen: no tenderness, no masses palpated. No hepatosplenomegaly. Bowel sounds positive.  Musculoskeletal: no clubbing / cyanosis. No joint deformity upper and lower extremities. Good ROM, no contractures. Normal muscle tone.  Skin: no rashes, lesions, ulcers. No induration Neurologic: CN 2-12 grossly intact. Sensation intact, DTR normal. Strength 5/5 in all 4.  Psychiatric: appears depressed/tearful, Alert and oriented x 3.     Labs on Admission: I have personally reviewed following labs and imaging studies  CBC:  Recent Labs Lab 06/26/16 0503  WBC 10.7*  NEUTROABS 6.7  HGB 14.6  HCT 42.5  MCV 86.7  PLT 99991111   Basic Metabolic Panel:  Recent Labs Lab 06/26/16 0503 06/26/16 1609  NA 139  --   K 3.1* 3.9  CL 109  --   CO2 20*  --   GLUCOSE 197*  --   BUN 10  --   CREATININE 0.64  --   CALCIUM 8.8*  --   MG  --  1.8   GFR: Estimated Creatinine Clearance: 102.6 mL/min (by C-G formula based on SCr of 0.64 mg/dL). Liver Function Tests:  Recent Labs Lab 06/26/16 0503  AST 15  ALT 14  ALKPHOS 108  BILITOT 0.5  PROT 7.2  ALBUMIN 4.0    No results for input(s): LIPASE, AMYLASE in the last 168 hours. No results for input(s): AMMONIA in the last 168 hours. Coagulation Profile: No results for input(s): INR, PROTIME in the last 168 hours. Cardiac Enzymes:  Recent Labs Lab 06/26/16 0509  TROPONINI <0.03   BNP (last 3 results) No results for input(s): PROBNP in the last 8760 hours. HbA1C: No results for input(s): HGBA1C in the last 72 hours. CBG:  Recent Labs Lab 06/26/16 0456 06/26/16 1552  GLUCAP 210* 140*   Lipid Profile: No results for input(s): CHOL, HDL, LDLCALC, TRIG, CHOLHDL, LDLDIRECT in the last 72 hours. Thyroid Function Tests: No results for input(s): TSH, T4TOTAL, FREET4, T3FREE, THYROIDAB in the last 72 hours. Anemia Panel: No results for input(s): VITAMINB12, FOLATE, FERRITIN, TIBC, IRON, RETICCTPCT in the last 72 hours. Urine analysis:    Component Value Date/Time   COLORURINE YELLOW 06/26/2016 [place] 06/26/2016 0459   LABSPEC 1.020 06/26/2016 0459   PHURINE 6.0 06/26/2016 0459   GLUCOSEU NEGATIVE 06/26/2016 0459   HGBUR TRACE (A) 06/26/2016 0459   BILIRUBINUR NEGATIVE 06/26/2016 0459   BILIRUBINUR negative 07/16/2015 1325   BILIRUBINUR small 08/18/2013 0841   KETONESUR TRACE (A) 06/26/2016 0459   PROTEINUR NEGATIVE 06/26/2016 0459   UROBILINOGEN 0.2 07/16/2015 1325   UROBILINOGEN 0.2 01/18/2015 0215   NITRITE NEGATIVE 06/26/2016 0459   LEUKOCYTESUR NEGATIVE 06/26/2016 0459   Sepsis Labs: !!!!!!!!!!!!!!!!!!!!!!!!!!!!!!!!!!!!!!!!!!!! @LABRCNTIP (procalcitonin:4,lacticidven:4) )No results found for this or any previous visit (from the past 240 hour(s)).   Radiological Exams on Admission: No results found.  EKG: Independently reviewed. Sinus bradycardia  Assessment/Plan Active Problems:   Hypothyroid   Hypokalemia   MDD (major depressive disorder), recurrent, severe, with psychosis ([place])   OD (overdose of drug), intentional self-harm, initial encounter  ([place])   Suicidal ideation   Clonidine overdose   Bradycardia  1. Intentional overdose with clonidine. Patient's case has been discussed with poison control who recommended supportive therapy and observation. The patient is complaining of dry mouth which is related to clonidine.  Suspect that her bradycardia is also related to clonidine. She did receive 1 dose of atropine in the emergency room. At this time her heart rate is ranging between 40s to 50s. Blood pressure stable. We'll continue to monitor and stepdown overnight in case further medications are needed.  2. Suicidal ideations. Patient will continue on seizure precautions. Will consult psychiatry to determine further disposition. ER has placed her on involuntary commitment  3. Sinus bradycardia. Likely related to clonidine. Continue supportive management. Check TSH.  4. Hypothyroidism. Continue home dose of Synthroid. Check TSH  5. Hypokalemia. Replace  6. Anxiety. Continue reduced dose of Xanax, in order to prevent withdrawal.  7. Chronic pain syndrome. Continue home medications.   DVT prophylaxis: lovenox Code Status: full code Family Communication: discussed with patient Disposition Plan: pending psychiatry evaluation Consults called: psychiatry Admission status: observation, stepdown   Eran Windish MD [facility] Pager [phone]  If 7PM-7AM, please contact night-coverage [email or web address] Password [email or web address]  06/26/2016, 5:55 PM

## 2016-06-27 ENCOUNTER — Inpatient Hospital Stay (HOSPITAL_COMMUNITY)
Admission: AD | Admit: 2016-06-27 | Discharge: 2016-06-30 | DRG: 885 | Disposition: A | Discharge status: Home / Self Care | Payer: BLUE CROSS/BLUE SHIELD | Source: Intra-hospital | Attending: Internal Medicine | Admitting: Internal Medicine

## 2016-06-27 ENCOUNTER — Encounter (HOSPITAL_COMMUNITY): Payer: Self-pay | Admitting: *Deleted

## 2016-06-27 DIAGNOSIS — Z8261 Family history of arthritis: Secondary | ICD-10-CM | POA: Diagnosis not present

## 2016-06-27 DIAGNOSIS — R45851 Suicidal ideations: Secondary | ICD-10-CM | POA: Diagnosis present

## 2016-06-27 DIAGNOSIS — F1990 Other psychoactive substance use, unspecified, uncomplicated: Secondary | ICD-10-CM | POA: Diagnosis not present

## 2016-06-27 DIAGNOSIS — F419 Anxiety disorder, unspecified: Secondary | ICD-10-CM | POA: Diagnosis present

## 2016-06-27 DIAGNOSIS — F5104 Psychophysiologic insomnia: Secondary | ICD-10-CM | POA: Diagnosis present

## 2016-06-27 DIAGNOSIS — F17210 Nicotine dependence, cigarettes, uncomplicated: Secondary | ICD-10-CM | POA: Diagnosis present

## 2016-06-27 DIAGNOSIS — F39 Unspecified mood [affective] disorder: Secondary | ICD-10-CM | POA: Diagnosis present

## 2016-06-27 DIAGNOSIS — Z79899 Other long term (current) drug therapy: Secondary | ICD-10-CM

## 2016-06-27 DIAGNOSIS — R001 Bradycardia, unspecified: Secondary | ICD-10-CM | POA: Diagnosis not present

## 2016-06-27 DIAGNOSIS — F445 Conversion disorder with seizures or convulsions: Secondary | ICD-10-CM

## 2016-06-27 DIAGNOSIS — F332 Major depressive disorder, recurrent severe without psychotic features: Secondary | ICD-10-CM | POA: Diagnosis not present

## 2016-06-27 DIAGNOSIS — E039 Hypothyroidism, unspecified: Secondary | ICD-10-CM | POA: Diagnosis present

## 2016-06-27 DIAGNOSIS — Z880 Allergy status to penicillin: Secondary | ICD-10-CM

## 2016-06-27 DIAGNOSIS — Z720 Tobacco use: Secondary | ICD-10-CM | POA: Diagnosis present

## 2016-06-27 DIAGNOSIS — Z8249 Family history of ischemic heart disease and other diseases of the circulatory system: Secondary | ICD-10-CM | POA: Diagnosis not present

## 2016-06-27 DIAGNOSIS — G894 Chronic pain syndrome: Secondary | ICD-10-CM | POA: Diagnosis present

## 2016-06-27 DIAGNOSIS — R569 Unspecified convulsions: Secondary | ICD-10-CM | POA: Diagnosis not present

## 2016-06-27 DIAGNOSIS — Z981 Arthrodesis status: Secondary | ICD-10-CM

## 2016-06-27 DIAGNOSIS — F4323 Adjustment disorder with mixed anxiety and depressed mood: Secondary | ICD-10-CM | POA: Diagnosis present

## 2016-06-27 DIAGNOSIS — E876 Hypokalemia: Secondary | ICD-10-CM | POA: Diagnosis not present

## 2016-06-27 DIAGNOSIS — T1491XA Suicide attempt, initial encounter: Secondary | ICD-10-CM | POA: Diagnosis not present

## 2016-06-27 DIAGNOSIS — R509 Fever, unspecified: Secondary | ICD-10-CM

## 2016-06-27 DIAGNOSIS — Z811 Family history of alcohol abuse and dependence: Secondary | ICD-10-CM | POA: Diagnosis not present

## 2016-06-27 DIAGNOSIS — Z888 Allergy status to other drugs, medicaments and biological substances status: Secondary | ICD-10-CM | POA: Diagnosis not present

## 2016-06-27 DIAGNOSIS — Z818 Family history of other mental and behavioral disorders: Secondary | ICD-10-CM | POA: Diagnosis not present

## 2016-06-27 DIAGNOSIS — T43212A Poisoning by selective serotonin and norepinephrine reuptake inhibitors, intentional self-harm, initial encounter: Secondary | ICD-10-CM | POA: Diagnosis not present

## 2016-06-27 DIAGNOSIS — Z8349 Family history of other endocrine, nutritional and metabolic diseases: Secondary | ICD-10-CM | POA: Diagnosis not present

## 2016-06-27 DIAGNOSIS — T465X2A Poisoning by other antihypertensive drugs, intentional self-harm, initial encounter: Secondary | ICD-10-CM | POA: Diagnosis not present

## 2016-06-27 LAB — BASIC METABOLIC PANEL
Anion gap: 9 (ref 5–15)
BUN: 8 mg/dL (ref 6–20)
CALCIUM: 8.4 mg/dL — AB (ref 8.9–10.3)
CO2: 19 mmol/L — AB (ref 22–32)
CREATININE: 0.6 mg/dL (ref 0.44–1.00)
Chloride: 110 mmol/L (ref 101–111)
GFR calc Af Amer: >60 mL/min (ref >60)
GLUCOSE: 104 mg/dL — AB (ref 65–99)
Potassium: 3.9 mmol/L (ref 3.5–5.1)
Sodium: 138 mmol/L (ref 135–145)

## 2016-06-27 LAB — MRSA PCR SCREENING: MRSA by PCR: NEGATIVE

## 2016-06-27 LAB — CBC
HCT: 40.9 % (ref 36.0–46.0)
Hemoglobin: 13.6 g/dL (ref 12.0–15.0)
MCH: 29.7 pg (ref 26.0–34.0)
MCHC: 33.3 g/dL (ref 30.0–36.0)
MCV: 89.3 fL (ref 78.0–100.0)
PLATELETS: 216 10*3/uL (ref 150–400)
RBC: 4.58 MIL/uL (ref 3.87–5.11)
RDW: 15.8 % — AB (ref 11.5–15.5)
WBC: 11.9 10*3/uL — ABNORMAL HIGH (ref 4.0–10.5)

## 2016-06-27 MED ORDER — ALUM & MAG HYDROXIDE-SIMETH 200-200-20 MG/5ML PO SUSP
30.0000 mL | ORAL | Status: DC | PRN
  Administered 2016-06-28 – 2016-06-29 (×4): 30 mL via ORAL
  Filled 2016-06-27 (×4): qty 30

## 2016-06-27 MED ORDER — HYDROXYZINE HCL 25 MG PO TABS
25.0000 mg | ORAL_TABLET | Freq: Three times a day (TID) | ORAL | Status: DC | PRN
  Administered 2016-06-27 – 2016-06-28 (×3): 25 mg via ORAL
  Filled 2016-06-27 (×3): qty 1

## 2016-06-27 MED ORDER — MAGNESIUM HYDROXIDE 400 MG/5ML PO SUSP: 30.0000 mL | Freq: Every day | ORAL | Status: DC | PRN

## 2016-06-27 MED ORDER — METHOCARBAMOL 500 MG PO TABS
750.0000 mg | ORAL_TABLET | Freq: Four times a day (QID) | ORAL | Status: DC | PRN
  Administered 2016-06-27 – 2016-06-29 (×5): 750 mg via ORAL
  Filled 2016-06-27 (×5): qty 1

## 2016-06-27 MED ORDER — LIDOCAINE 5 % EX PTCH
1.0000 | MEDICATED_PATCH | CUTANEOUS | Status: DC
  Administered 2016-06-27: 1 via TRANSDERMAL
  Filled 2016-06-27 (×3): qty 1

## 2016-06-27 MED ORDER — ALPRAZOLAM 0.5 MG PO TABS
1.0000 mg | ORAL_TABLET | Freq: Three times a day (TID) | ORAL | Status: DC | PRN
  Administered 2016-06-27: 1 mg via ORAL
  Filled 2016-06-27: qty 2

## 2016-06-27 MED ORDER — ZOLPIDEM TARTRATE 5 MG PO TABS
5.0000 mg | ORAL_TABLET | Freq: Once | ORAL | Status: AC
  Administered 2016-06-27: 5 mg via ORAL
  Filled 2016-06-27: qty 1

## 2016-06-27 MED ORDER — TRAZODONE HCL 50 MG PO TABS
50.0000 mg | ORAL_TABLET | Freq: Every evening | ORAL | Status: DC | PRN
  Administered 2016-06-28 (×2): 50 mg via ORAL
  Filled 2016-06-27 (×2): qty 1

## 2016-06-27 MED ORDER — ACETAMINOPHEN 325 MG PO TABS
650.0000 mg | ORAL_TABLET | Freq: Four times a day (QID) | ORAL | Status: DC | PRN
  Administered 2016-06-27 – 2016-06-29 (×5): 650 mg via ORAL
  Filled 2016-06-27 (×5): qty 2

## 2016-06-27 MED ORDER — HYDROMORPHONE HCL 1 MG/ML IJ SOLN
2.0000 mg | Freq: Once | INTRAMUSCULAR | Status: AC
  Administered 2016-06-27: 2 mg via INTRAVENOUS
  Filled 2016-06-27: qty 2

## 2016-06-27 MED ORDER — NICOTINE 21 MG/24HR TD PT24
21.0000 mg | MEDICATED_PATCH | Freq: Every day | TRANSDERMAL | Status: DC
  Administered 2016-06-28 – 2016-06-29 (×2): 21 mg via TRANSDERMAL
  Filled 2016-06-27 (×6): qty 1

## 2016-06-27 MED ORDER — LEVOTHYROXINE SODIUM 50 MCG PO TABS
50.0000 ug | ORAL_TABLET | Freq: Every day | ORAL | Status: DC
  Administered 2016-06-28 – 2016-06-29 (×2): 50 ug via ORAL
  Filled 2016-06-27 (×2): qty 1
  Filled 2016-06-27: qty 2
  Filled 2016-06-27 (×3): qty 1
  Filled 2016-06-27: qty 2

## 2016-06-27 NOTE — Progress Notes (Signed)
Report call to Behavior Health. RN verbalized understanding of report. Pt left floor ambulatory with sheriff.
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
## 2016-06-27 NOTE — Progress Notes (Signed)
Patient did attend the evening karaoke group. Pt was engaged, supportive, and participated by singing a song.
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
## 2016-06-27 NOTE — BHH Counselor (Signed)
Pt accepted to [facility] per AC Linsey.  Per Dr. Johnsie Kindred note: "Review of the [place] shows patient gets #180 alprazolam 1 mg tablets monthly last filled October 10 from her psychiatrist, she gets #30 zolpidem 10 mg tablets monthly last filled October 12 from her psychiatrist. She also got #70 hydrocodone 5/325 filled on October 12 from a PA at [facility]. She has had 12 narcotic prescriptions filled since May 11 from multiple providers in ranging from tramadol to OxyContin 10 mg tablets to oxycodone 10/325 to fentanyl patches to hydrocodone 5/325 and oxycodone 5/325 with amounts varying from 20-90 tabs. She was on Adderall last filled August 23." Pt was informed per [facility] that would not receive Xanax and Hydrodone.  Pt accepts being admitted to [facility].  Lorenza [place], [facility] Triage Specialist
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
## 2016-06-28 DIAGNOSIS — Z888 Allergy status to other drugs, medicaments and biological substances status: Secondary | ICD-10-CM

## 2016-06-28 DIAGNOSIS — Z880 Allergy status to penicillin: Secondary | ICD-10-CM

## 2016-06-28 DIAGNOSIS — Z8261 Family history of arthritis: Secondary | ICD-10-CM

## 2016-06-28 DIAGNOSIS — T1491XA Suicide attempt, initial encounter: Secondary | ICD-10-CM

## 2016-06-28 DIAGNOSIS — T465X2A Poisoning by other antihypertensive drugs, intentional self-harm, initial encounter: Secondary | ICD-10-CM

## 2016-06-28 DIAGNOSIS — Z8349 Family history of other endocrine, nutritional and metabolic diseases: Secondary | ICD-10-CM

## 2016-06-28 MED ORDER — VITAMIN B-1 100 MG PO TABS
100.0000 mg | ORAL_TABLET | Freq: Every day | ORAL | Status: DC
  Administered 2016-06-29: 100 mg via ORAL
  Filled 2016-06-28 (×4): qty 1

## 2016-06-28 MED ORDER — GABAPENTIN 300 MG PO CAPS
300.0000 mg | ORAL_CAPSULE | Freq: Three times a day (TID) | ORAL | Status: DC
  Administered 2016-06-28 – 2016-06-29 (×4): 300 mg via ORAL
  Filled 2016-06-28 (×11): qty 1

## 2016-06-28 MED ORDER — LOPERAMIDE HCL 2 MG PO CAPS
2.0000 mg | ORAL_CAPSULE | ORAL | Status: DC | PRN
  Administered 2016-06-28: 2 mg via ORAL
  Filled 2016-06-28: qty 1

## 2016-06-28 MED ORDER — ADULT MULTIVITAMIN W/MINERALS CH
1.0000 | ORAL_TABLET | Freq: Every day | ORAL | Status: DC
  Administered 2016-06-28 – 2016-06-29 (×2): 1 via ORAL
  Filled 2016-06-28 (×6): qty 1

## 2016-06-28 MED ORDER — GABAPENTIN 600 MG PO TABS
300.0000 mg | ORAL_TABLET | Freq: Three times a day (TID) | ORAL | Status: DC
  Administered 2016-06-28: 300 mg via ORAL
  Filled 2016-06-28 (×6): qty 0.5

## 2016-06-28 MED ORDER — GABAPENTIN 600 MG PO TABS
300.0000 mg | ORAL_TABLET | Freq: Three times a day (TID) | ORAL | Status: DC
  Filled 2016-06-28 (×3): qty 0.5

## 2016-06-28 MED ORDER — ONDANSETRON 4 MG PO TBDP
4.0000 mg | ORAL_TABLET | Freq: Four times a day (QID) | ORAL | Status: DC | PRN
  Administered 2016-06-28: 4 mg via ORAL
  Filled 2016-06-28: qty 1

## 2016-06-28 MED ORDER — LIDOCAINE 5 % EX PTCH
3.0000 | MEDICATED_PATCH | CUTANEOUS | Status: DC
  Administered 2016-06-28 – 2016-06-29 (×2): 3 via TRANSDERMAL
  Filled 2016-06-28 (×4): qty 3

## 2016-06-28 MED ORDER — LORAZEPAM 1 MG PO TABS: 1.0000 mg | ORAL_TABLET | Freq: Four times a day (QID) | ORAL | Status: DC | PRN

## 2016-06-28 MED ORDER — OXYMETAZOLINE HCL 0.05 % NA SOLN
2.0000 | Freq: Two times a day (BID) | NASAL | Status: DC | PRN
  Filled 2016-06-28: qty 15

## 2016-06-28 MED ORDER — NAPROXEN 250 MG PO TABS
500.0000 mg | ORAL_TABLET | Freq: Two times a day (BID) | ORAL | Status: DC | PRN
  Administered 2016-06-28 (×2): 500 mg via ORAL
  Filled 2016-06-28 (×2): qty 1
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
## 2016-06-28 NOTE — Progress Notes (Addendum)
Data. Patient denies SI/HI/AVH. Patient interacting well with staff and other patients. Affect has been bright through out shift. She has made multiple requests for pain medications for her multiple medical issues. Multiple requests for nausea, diarrhea, anxiety and indigestion medications. Education provided on the significant interactions that taking so many different medications can cause. Patient stated, "Well if they are there, I will use them. I'm sick and in pain." On her self assessment patient reports 5/10  For depression and hopelessness and 10/10 for anxiety. Her goal for today is: "pain and sleep".  Action. Emotional support and encouragement offered. Education provided on medication, indications and side effect. Q 15 minute checks done for safety. Response. Safety on the unit maintained through 15 minute checks.  Medications taken as prescribed. Attended groups. Remained calm and appropriate through out shift.

## 2016-06-28 NOTE — Progress Notes (Signed)
Medication note  Patient is experiencing some runny nose symptoms. She reports Vistaril was not effective for relief.  Assessment/plan runny nose possibly secondary to  opiate withdrawal symptoms. Patient may have a trial of Afrin 2 sprays each nostril every 12 hours when necessary.

## 2016-06-28 NOTE — Progress Notes (Signed)
  [facility] Adult Case Management Discharge Plan :  Will you be returning to the same living situation after discharge:  Yes,  home At discharge, do you have transportation home?: Yes,  PATIENT IS SCHEDULED FOR SATURDAY DISCHARGE AND PLANS TO BE PICKED UP AFTER LUNCH BY A FRIEND. (DISCHARGE SCHEDULED FOR SATURDAY PER DR. Sharolyn Douglas) Do you have the ability to pay for your medications: Yes,  [employer] private insurance  Release of information consent forms completed and submitted to medical records by CSW.  Patient to Follow up at: Follow-up Information    [name], MD Follow up on 07/03/2016.   Specialty:  Psychiatry Why:  Appt on this date at 11:30AM with Dr. Toy Care for hospital follow-up/medication management.  Contact information[address] [place] [address] [phone]        Patient not interested in additional referrals. .           Next level of care provider has access to [place] and Suicide Prevention discussed: Yes,  SPE completed with pt; pt declines to consent to family contact. SPI pamphlet and mobile crisis information provided to pt and she was encouraged to share information with her support network.   Have you used any form of tobacco in the last 30 days? (Cigarettes, Smokeless Tobacco, Cigars, and/or Pipes): Yes  Has patient been referred to the Quitline?: Patient refused referral  Patient has been referred for addiction treatment: Yes  Nema Oatley N Smart LCSW 06/28/2016, 11:18 AM

## 2016-06-28 NOTE — [facility] Suicide Risk Assessment (Signed)
[place] INPATIENT:  Family/Significant Other Suicide Prevention Education  Suicide Prevention Education:  Patient Refusal for Family/Significant Other Suicide Prevention Education: The patient LORELEE CORNELSEN has refused to provide written consent for family/significant other to be provided Family/Significant Other Suicide Prevention Education during admission and/or prior to discharge.  Physician notified.  SPE completed with pt, as pt refused to consent to family contact. SPI pamphlet provided to pt and pt was encouraged to share information with support network, ask questions, and talk about any concerns relating to SPE. Pt denies access to guns/firearms and verbalized understanding of information provided. Mobile Crisis information also provided to pt.   Brando Taves N Smart LCSW 06/28/2016, 11:07 AM

## 2016-06-28 NOTE — Tx Team (Signed)
Interdisciplinary Treatment and Diagnostic Plan Update  06/28/2016 Time of Session: 9:30AM DELORIA BRASSFIELD MRN: [id number]  Principal Diagnosis: Mood disorder [facility])  Secondary Diagnoses: Principal Problem:   Mood disorder ([place]) Active Problems:   MDD (major depressive disorder), recurrent severe, without psychosis ([place])   Current Medications:  Current Facility-Administered Medications  Medication Dose Route Frequency Provider Last Rate Last Dose  . acetaminophen (TYLENOL) tablet 650 mg  650 mg Oral Q6H PRN Ethelene Hal, NP   650 mg at 06/28/16 0326  . alum & mag hydroxide-simeth (MAALOX/MYLANTA) 200-200-20 MG/5ML suspension 30 mL  30 mL Oral Q4H PRN Ethelene Hal, NP   30 mL at 06/28/16 0149  . hydrOXYzine (ATARAX/VISTARIL) tablet 25 mg  25 mg Oral TID PRN Ethelene Hal, NP   25 mg at 06/27/16 2201  . levothyroxine (SYNTHROID, LEVOTHROID) tablet 50 mcg  50 mcg Oral QAC breakfast Ethelene Hal, NP   50 mcg at 06/28/16 8416  . lidocaine (LIDODERM) 5 % 1 patch  1 patch Transdermal Q24H Rozetta Nunnery, NP   1 patch at 06/27/16 2306  . magnesium hydroxide (MILK OF MAGNESIA) suspension 30 mL  30 mL Oral Daily PRN Ethelene Hal, NP      . methocarbamol (ROBAXIN) tablet 750 mg  750 mg Oral Q6H PRN Rozetta Nunnery, NP   750 mg at 06/28/16 0327  . nicotine (NICODERM CQ - dosed in mg/24 hours) patch 21 mg  21 mg Transdermal Daily Kerrie Buffalo, NP      . traZODone (DESYREL) tablet 50 mg  50 mg Oral QHS PRN Ethelene Hal, NP   50 mg at 06/28/16 0149   PTA Medications: Prescriptions Prior to Admission  Medication Sig Dispense Refill Last Dose  . albuterol (PROVENTIL HFA;VENTOLIN HFA) 108 (90 Base) MCG/ACT inhaler Inhale 2 puffs into the lungs every 4 (four) hours as needed for wheezing or shortness of breath (cough, shortness of breath or wheezing.). 1 Inhaler 1 unknown  . amphetamine-dextroamphetamine (ADDERALL) 20 MG tablet Take 20 mg by mouth 3 (three)  times daily.   06/25/2016 at Unknown time  . cyclobenzaprine (FLEXERIL) 10 MG tablet Take 1 tablet (10 mg total) by mouth 3 (three) times daily as needed for muscle spasms. 30 tablet 0 06/25/2016 at Unknown time  . levothyroxine (SYNTHROID, LEVOTHROID) 50 MCG tablet Take 1 tablet (50 mcg total) by mouth daily (Patient taking differently: Take 25-50 mcg by mouth daily before breakfast. Take 1 tablet (50 mcg total) by mouth daily) 90 tablet 0 unknown  . lidocaine (LIDODERM) 5 % Place 2 patches onto the skin daily. Remove and discard patch within 12 hours. 180 patch 0 06/25/2016 at Unknown time  . mometasone (NASONEX) 50 MCG/ACT nasal spray Place 2 sprays into the nose daily. 51 g 3 unknown  . naproxen (NAPROSYN) 500 MG tablet Take 1 tablet (500 mg total) by mouth 2 (two) times daily with a meal. 20 tablet 0 unknown  . ondansetron (ZOFRAN) 8 MG tablet Take 8 mg by mouth daily as needed for nausea or vomiting. Reported on 03/26/2016   unknown  . pregabalin (LYRICA) 200 MG capsule Take 1 capsule (200 mg total) by mouth 3 (three) times daily. 270 capsule 1 06/25/2016 at Unknown time    Patient Stressors: Financial difficulties Occupational concerns Traumatic event  Patient Strengths: Capable of independent living Agricultural engineer for treatment/growth Physical Health  Treatment Modalities: Medication Management, Group therapy, Case management,  1 to 1 session with clinician,  Psychoeducation, Recreational therapy.   Physician Treatment Plan for Primary Diagnosis: Mood disorder ([place]) Long Term Goal(s):     Short Term Goals:    Medication Management: Evaluate patient's response, side effects, and tolerance of medication regimen.  Therapeutic Interventions: 1 to 1 sessions, Unit Group sessions and Medication administration.  Evaluation of Outcomes: Met  Physician Treatment Plan for Secondary Diagnosis: Principal Problem:   Mood disorder ([place]) Active Problems:   MDD (major  depressive disorder), recurrent severe, without psychosis ([place])    Medication Management: Evaluate patient's response, side effects, and tolerance of medication regimen.  Therapeutic Interventions: 1 to 1 sessions, Unit Group sessions and Medication administration.  Evaluation of Outcomes: met    RN Treatment Plan for Primary Diagnosis: Mood disorder ([place]) Long Term Goal(s): Knowledge of disease and therapeutic regimen to maintain health will improve  Short Term Goals: Ability to remain free from injury will improve, Ability to disclose and discuss suicidal ideas and Ability to identify and develop effective coping behaviors will improve  Medication Management: RN will administer medications as ordered by provider, will assess and evaluate patient's response and provide education to patient for prescribed medication. RN will report any adverse and/or side effects to prescribing provider.  Therapeutic Interventions: 1 on 1 counseling sessions, Psychoeducation, Medication administration, Evaluate responses to treatment, Monitor vital signs and CBGs as ordered, Perform/monitor CIWA, COWS, AIMS and Fall Risk screenings as ordered, Perform wound care treatments as ordered.  Evaluation of Outcomes: met   LCSW Treatment Plan for Primary Diagnosis: Mood disorder ([place]) Long Term Goal(s): Safe transition to appropriate next level of care at discharge, Engage patient in therapeutic group addressing interpersonal concerns.  Short Term Goals: Engage patient in aftercare planning with referrals and resources, Facilitate patient progression through stages of change regarding substance use diagnoses and concerns and Identify triggers associated with mental health/substance abuse issues  Therapeutic Interventions: Assess for all discharge needs, 1 to 1 time with Social worker, Explore available resources and support systems, Assess for adequacy in community support network, Educate family and significant  other(s) on suicide prevention, Complete Psychosocial Assessment, Interpersonal group therapy.  Evaluation of Outcomes: Progressing   Progress in Treatment: Attending groups: Yes. Participating in groups: Yes. Taking medication as prescribed: Yes. Toleration medication: Yes. Family/Significant other contact made: SPE completed with pt; pt declined to consent to family contact.  Patient understands diagnosis: Yes. Discussing patient identified problems/goals with staff: Yes. Medical problems stabilized or resolved: No. Pt continues to endorse chronic pain and states that it is not properly managed.  Denies suicidal/homicidal ideation: Yes. Issues/concerns per patient self-inventory: No. Other: n/a  New problem(s) identified: Yes, Describe:  pt is wanting more pain management/medications prescribed. MD has met with pt 06/28/2016  About these concerns.   Discharge Plan or Barriers: Pt agreeable to follow-up with Dr. Toy Care and is not wanting additional referrals. She was open to receiving information to the [facility] in [place] for additional support groups and community resources. She also did not want an appt scheduled with her pain management MD or records sent to that office.   Reason for Continuation of Hospitalization:  Medication stabilization   Estimated Length of Stay: PATIENT SCHEDULED FOR Saturday AFTERNOON DISCHARGE PER DR. Sharolyn Douglas. Pt confirmed with CSW that she is not SI/HI and reports no AVH. Pt pleasant during interaction and stated that she is feeling much better this morning other than her chronic pain issues.   Attendees: Patient: 06/28/2016 8:07 AM  Physician: Dr. Sharolyn Douglas MD 06/28/2016  8:07 AM  Nursing: Katrina Stack; Lissa Merlin RN 06/28/2016 8:07 AM  RN Care Manager: 06/28/2016 8:07 AM  Social Worker: Maxie Better, LCSW 06/28/2016 8:07 AM  Recreational Therapist:  06/28/2016 8:07 AM  Other:  06/28/2016 8:07 AM  Other:  06/28/2016 8:07 AM  Other: 06/28/2016  8:07 AM    Scribe for Treatment Team: [place], LCSW 06/28/2016 8:07 AM

## 2016-06-28 NOTE — BHH Group Notes (Signed)
[facility] Group Notes:  (Nursing/MHT/Case Management/Adjunct)  Date:  06/28/2016  Time:  10:11 PM  Type of Therapy:  AA group  Participation Level:  Active  Participation Quality:  Appropriate and Attentive  Affect:  Appropriate  Cognitive:  Alert and Appropriate  Insight:  Appropriate  Engagement in Group:  Engaged  Modes of Intervention:  Support  Summary of Progress/Problems: Engaged and asked questions  Franciso Bend 06/28/2016, 10:11 PM

## 2016-06-28 NOTE — BHH Group Notes (Signed)
[place] LCSW Group Therapy  06/28/2016 3:22 PM  Type of Therapy:  Group Therapy  Participation Level:  Active  Participation Quality:  Attentive, Monopolizing and Redirectable  Affect:  Anxious and Appropriate  Cognitive:  Alert and Oriented  Insight:  Distracting, Engaged and Monopolizing  Engagement in Therapy:  Monopolizing  Modes of Intervention:  Discussion, Education, Exploration, Limit-setting, Rapport Building, Art therapist, Socialization and Support  Summary of Progress/Problems: Emotion Regulation: This group focused on both positive and negative emotion identification and allowed group members to process ways to identify feelings, regulate negative emotions, and find healthy ways to manage internal/external emotions. Group members were asked to reflect on a time when their reaction to an emotion led to a negative outcome and explored how alternative responses using emotion regulation would have benefited them. Group members were also asked to discuss a time when emotion regulation was utilized when a negative emotion was experienced. Barbara Rogers was attentive but tended to monopolize discussion around her chronic health issues and lack of pain management while at the hospital. Barbara Rogers was pleasant and redirectable. She shared that she struggles with PMDD and wants a hysterectomy "because I know it's hormonal." She continues to struggle in the group setting with limited insight.   [name] 06/28/2016, 3:22 PM
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
## 2016-06-28 NOTE — Plan of Care (Signed)
Problem: Medication: Goal: Compliance with prescribed medication regimen will improve Outcome: Progressing Patient was able to take her scheduled and PRN medications appropriately, this shift.
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
## 2016-06-28 NOTE — BHH Suicide Risk Assessment (Signed)
[facility] Admission Suicide Risk Assessment   Nursing information obtained from:  Patient Demographic factors:  Caucasian, Low socioeconomic status, Living alone Current Mental Status:  Suicidal ideation indicated by patient, Suicide plan, Plan includes specific time, place, or method, Self-harm thoughts, Self-harm behaviors, Intention to act on suicide plan, Belief that plan would result in death Loss Factors:  Loss of significant relationship, Decline in physical health, Financial problems / change in socioeconomic status Historical Factors:  Family history of suicide, Family history of mental illness or substance abuse, Impulsivity Risk Reduction Factors:  Positive social support  Total Time spent with patient: 30 minutes Principal Problem: Mood disorder ([place]) Diagnosis:   Patient Active Problem List   Diagnosis Date Noted  . MDD (major depressive disorder), recurrent severe, without psychosis ([place]) [F33.2] 06/27/2016  . OD (overdose of drug), intentional self-harm, initial encounter ([place]) [T50.902A] 06/26/2016  . Suicidal ideation [R45.851] 06/26/2016  . Clonidine overdose [T46.5X1A] 06/26/2016  . Bradycardia [R00.1] 06/26/2016  . Left scapula fracture [S42.102A] 02/09/2016  . Tobacco abuse [Z72.0]   . Adjustment disorder with mixed anxiety and depressed mood [F43.23]   . Dysphagia [R13.10]   . T12 burst fracture ([place]) [id number] 01/29/2016  . C3 cervical fracture ([place]) [S12.200A] 01/29/2016  . Fracture of left clavicle [S42.002A] 01/29/2016  . Multiple fractures of ribs of both sides [S22.43XA] 01/29/2016  . Cervical transverse process fracture ([facility]) [S12.9XXA] 01/29/2016  . Lumbar transverse process fracture ([place]) [S32.009A] 01/29/2016  . MDD (major depressive disorder), recurrent, severe, with psychosis ([place]) [F33.3] 01/19/2015  . Hypokalemia [E87.6] 01/18/2015  . Migraine without aura [G43.009]   . IUD (intrauterine device) in place [Z97.5] 06/02/2013  . Hypothyroid [E03.9]  12/09/2012  . Chronic left shoulder pain [M25.512, G89.29]   . Mood disorder ([place]) [F39] 03/25/2012  . AR (allergic rhinitis) [J30.9] 03/25/2012  . Dysmenorrhea [N94.6] 03/25/2012  . PMDD (premenstrual dysphoric disorder) [F32.81] 03/25/2012  . Chronic insomnia [F51.04] 03/25/2012   Subjective Data: Patient denies any current suicidal or homicidal ideation, plan or intent.  Continued Clinical Symptoms:  Alcohol Use Disorder Identification Test Final Score (AUDIT): 0 The "Alcohol Use Disorders Identification Test", Guidelines for Use in Primary Care, Second Edition.  World Pharmacologist [facility]). Score between 0-7:  no or low risk or alcohol related problems. Score between 8-15:  moderate risk of alcohol related problems. Score between 16-19:  high risk of alcohol related problems. Score 20 or above:  warrants further diagnostic evaluation for alcohol dependence and treatment.   CLINICAL FACTORS:   Alcohol/Substance Abuse/Dependencies   Musculoskeletal: Strength & Muscle Tone: within normal limits Gait & Station: stiff Patient leans: N/A  Psychiatric Specialty Exam: Physical Exam  ROS  Blood pressure 116/83, pulse (!) 103, temperature 98.4 F (36.9 C), temperature source Oral, resp. rate (!) 24, height 5\' 9"  (1.753 m), weight 78 kg (172 lb), last menstrual period 06/26/2016.Body mass index is 25.4 kg/m.      General Appearance: Casual  Eye Contact:  Fair  Speech:  Clear and Coherent  Volume:  Normal  Mood:  Anxious  Affect:  Congruent  Thought Process:  Disorganized  Orientation:  Full (Time, Place, and Person)  Thought Content:  Negative  Suicidal Thoughts:  No  Homicidal Thoughts:  No  Memory:  Negative  Judgement:  Impaired  Insight:  Lacking  Psychomotor Activity:  Normal  Concentration:  Concentration: Fair and Attention Span: Fair  Recall:  [employer] of Knowledge:  Fair  Language:  Good  Akathisia:  No  Handed:  Left  AIMS (if indicated):      Assets:  Financial Resources/Insurance Housing Resilience  ADL's:  Intact  Cognition:  WNL  Sleep:        COGNITIVE FEATURES THAT CONTRIBUTE TO RISK:  Loss of executive function    SUICIDE RISK:   Mild:  Suicidal ideation of limited frequency, intensity, duration, and specificity.  There are no identifiable plans, no associated intent, mild dysphoria and related symptoms, good self-control (both objective and subjective assessment), few other risk factors, and identifiable protective factors, including available and accessible social support.   PLAN OF CARE: see PAA  I certify that inpatient services furnished can reasonably be expected to improve the patient's condition.  Linard Millers, MD 06/28/2016, 11:49 AM

## 2016-06-28 NOTE — BHH Suicide Risk Assessment (Signed)
[facility] Discharge Suicide Risk Assessment   Principal Problem: Mood disorder [facility]) Discharge Diagnoses:  Patient Active Problem List   Diagnosis Date Noted  . MDD (major depressive disorder), recurrent severe, without psychosis ([place]) [F33.2] 06/27/2016  . OD (overdose of drug), intentional self-harm, initial encounter ([place]) [T50.902A] 06/26/2016  . Suicidal ideation [R45.851] 06/26/2016  . Clonidine overdose [T46.5X1A] 06/26/2016  . Bradycardia [R00.1] 06/26/2016  . Left scapula fracture [S42.102A] 02/09/2016  . Tobacco abuse [Z72.0]   . Adjustment disorder with mixed anxiety and depressed mood [F43.23]   . Dysphagia [R13.10]   . T12 burst fracture ([place]) [id number] 01/29/2016  . C3 cervical fracture ([place]) [S12.200A] 01/29/2016  . Fracture of left clavicle [S42.002A] 01/29/2016  . Multiple fractures of ribs of both sides [S22.43XA] 01/29/2016  . Cervical transverse process fracture (HCC) [S12.9XXA] 01/29/2016  . Lumbar transverse process fracture ([place]) [S32.009A] 01/29/2016  . MDD (major depressive disorder), recurrent, severe, with psychosis ([place]) [F33.3] 01/19/2015  . Hypokalemia [E87.6] 01/18/2015  . Migraine without aura [G43.009]   . IUD (intrauterine device) in place [Z97.5] 06/02/2013  . Hypothyroid [E03.9] 12/09/2012  . Chronic left shoulder pain [M25.512, G89.29]   . Mood disorder ([place]) [F39] 03/25/2012  . AR (allergic rhinitis) [J30.9] 03/25/2012  . Dysmenorrhea [N94.6] 03/25/2012  . PMDD (premenstrual dysphoric disorder) [F32.81] 03/25/2012  . Chronic insomnia [F51.04] 03/25/2012    Total Time spent with patient: 15 minutes  Musculoskeletal: Strength & Muscle Tone: within normal limits Gait & Station: stiff Patient leans: N/A  Psychiatric Specialty Exam: ROS  Blood pressure 116/83, pulse (!) 103, temperature 98.4 F (36.9 C), temperature source Oral, resp. rate (!) 24, height 5\' 9"  (1.753 m), weight 78 kg (172 lb), last menstrual period 06/26/2016.Body mass index is  25.4 kg/m.  General Appearance: Casual  Eye Contact::  Good  Speech:  Clear and Coherent409  Volume:  Normal  Mood:  Anxious  Affect:  Appropriate and Congruent  Thought Process:  Coherent  Orientation:  Full (Time, Place, and Person)  Thought Content:  Negative  Suicidal Thoughts:  No  Homicidal Thoughts:  No  Memory:  Negative  Judgement:  Fair  Insight:  Shallow  Psychomotor Activity:  Normal  Concentration:  Good  Recall:  Good  Fund of Knowledge:Good  Language: Good  Akathisia:  No  Handed:  Left  AIMS (if indicated):     Assets:  Resilience  Sleep:     Cognition: WNL  ADL's:  Intact   Mental Status Per Nursing Assessment::   On Admission:  Suicidal ideation indicated by patient, Suicide plan, Plan includes specific time, place, or method, Self-harm thoughts, Self-harm behaviors, Intention to act on suicide plan, Belief that plan would result in death  Demographic Factors:  Caucasian  Loss Factors: Decline in physical health  Historical Factors: Prior suicide attempts and Family history of suicide  Risk Reduction Factors:   Employed  Continued Clinical Symptoms:  Alcohol/Substance Abuse/Dependencies  Cognitive Features That Contribute To Risk:  None    Suicide Risk:  Mild:  Suicidal ideation of limited frequency, intensity, duration, and specificity.  There are no identifiable plans, no associated intent, mild dysphoria and related symptoms, good self-control (both objective and subjective assessment), few other risk factors, and identifiable protective factors, including available and accessible social support.  Follow-up Information    [name], MD Follow up on 07/03/2016.   Specialty:  Psychiatry Why:  Appt on this date at 11:30AM with Dr. Toy Care for hospital follow-up/medication management.  Contact information: [place]  [address] [place] [address] [phone]        Patient not interested in additional  referrals. .           Plan Of Care/Follow-up recommendations:  Other:  Patient currently denies any suicidal or homicidal ideation, plan or intent. If her present course continues she will be discharged to return to outpatient follow-up tomorrow.  Linard Millers, MD 06/28/2016, 3:07 PM

## 2016-06-28 NOTE — Progress Notes (Signed)
Recreation Therapy Notes  Date: 06/28/16 Time: 0930 Location: 300 Group Room  Group Topic: Stress Management  Goal Area(s) Addresses:  Patient will verbalize importance of using healthy stress management.  Patient will identify positive emotions associated with healthy stress management.   Intervention: Stress Management  Activity :  Peaceful Waves Imagery.  LRT introduced the stress management of guided imagery.  LRT read a script to allow patients to participate in the technique.  Patients were to follow along as LRT read script.  Education:  Stress Management, Discharge Planning.   Education Outcome: Acknowledges edcuation/In group clarification offered/Needs additional education  Clinical Observations/Feedback:  Pt did not attend group.    Victorino Sparrow, LRT/CTRS         Victorino Sparrow A 06/28/2016 12:40 PM

## 2016-06-29 ENCOUNTER — Inpatient Hospital Stay (HOSPITAL_COMMUNITY): Payer: BLUE CROSS/BLUE SHIELD

## 2016-06-29 ENCOUNTER — Encounter (HOSPITAL_COMMUNITY): Payer: Self-pay

## 2016-06-29 DIAGNOSIS — Z8249 Family history of ischemic heart disease and other diseases of the circulatory system: Secondary | ICD-10-CM

## 2016-06-29 DIAGNOSIS — R569 Unspecified convulsions: Secondary | ICD-10-CM

## 2016-06-29 DIAGNOSIS — F39 Unspecified mood [affective] disorder: Secondary | ICD-10-CM

## 2016-06-29 DIAGNOSIS — F4323 Adjustment disorder with mixed anxiety and depressed mood: Secondary | ICD-10-CM

## 2016-06-29 DIAGNOSIS — F1990 Other psychoactive substance use, unspecified, uncomplicated: Secondary | ICD-10-CM

## 2016-06-29 DIAGNOSIS — F17210 Nicotine dependence, cigarettes, uncomplicated: Secondary | ICD-10-CM

## 2016-06-29 DIAGNOSIS — Z79899 Other long term (current) drug therapy: Secondary | ICD-10-CM

## 2016-06-29 DIAGNOSIS — F332 Major depressive disorder, recurrent severe without psychotic features: Principal | ICD-10-CM

## 2016-06-29 DIAGNOSIS — Z818 Family history of other mental and behavioral disorders: Secondary | ICD-10-CM

## 2016-06-29 DIAGNOSIS — Z811 Family history of alcohol abuse and dependence: Secondary | ICD-10-CM

## 2016-06-29 DIAGNOSIS — E039 Hypothyroidism, unspecified: Secondary | ICD-10-CM

## 2016-06-29 DIAGNOSIS — Z720 Tobacco use: Secondary | ICD-10-CM

## 2016-06-29 LAB — URINALYSIS, ROUTINE W REFLEX MICROSCOPIC
BILIRUBIN URINE: NEGATIVE
GLUCOSE, UA: NEGATIVE mg/dL
KETONES UR: NEGATIVE mg/dL
Leukocytes, UA: NEGATIVE
Nitrite: NEGATIVE
PROTEIN: NEGATIVE mg/dL
Specific Gravity, Urine: 1.01 (ref 1.005–1.030)
pH: 7 (ref 5.0–8.0)

## 2016-06-29 LAB — I-STAT CHEM 8, ED
BUN: 8 mg/dL (ref 6–20)
CHLORIDE: 106 mmol/L (ref 101–111)
Calcium, Ion: 1.02 mmol/L — ABNORMAL LOW (ref 1.15–1.40)
Creatinine, Ser: 0.6 mg/dL (ref 0.44–1.00)
Glucose, Bld: 88 mg/dL (ref 65–99)
HEMATOCRIT: 40 % (ref 36.0–46.0)
HEMOGLOBIN: 13.6 g/dL (ref 12.0–15.0)
POTASSIUM: 4.2 mmol/L (ref 3.5–5.1)
SODIUM: 140 mmol/L (ref 135–145)
TCO2: 25 mmol/L (ref 0–100)

## 2016-06-29 LAB — URINE MICROSCOPIC-ADD ON

## 2016-06-29 MED ORDER — DIPHENHYDRAMINE HCL 50 MG/ML IJ SOLN: 25.0000 mg | Freq: Once | INTRAMUSCULAR | Status: DC

## 2016-06-29 MED ORDER — NICOTINE 21 MG/24HR TD PT24
21.0000 mg | MEDICATED_PATCH | Freq: Every day | TRANSDERMAL | Status: DC
  Administered 2016-06-30: 21 mg via TRANSDERMAL
  Filled 2016-06-29: qty 1

## 2016-06-29 MED ORDER — NAPROXEN 250 MG PO TABS
500.0000 mg | ORAL_TABLET | Freq: Two times a day (BID) | ORAL | Status: DC
  Administered 2016-06-30: 500 mg via ORAL
  Filled 2016-06-29: qty 2

## 2016-06-29 MED ORDER — LORAZEPAM 2 MG/ML IJ SOLN
0.5000 mg | INTRAMUSCULAR | Status: DC | PRN
  Administered 2016-06-30: 0.5 mg via INTRAVENOUS

## 2016-06-29 MED ORDER — LORAZEPAM 2 MG/ML IJ SOLN
1.0000 mg | Freq: Once | INTRAMUSCULAR | Status: AC
  Administered 2016-06-29: 2 mg via INTRAVENOUS

## 2016-06-29 MED ORDER — FLUTICASONE PROPIONATE 50 MCG/ACT NA SUSP
1.0000 | Freq: Every day | NASAL | Status: DC
  Filled 2016-06-29: qty 16

## 2016-06-29 MED ORDER — ONDANSETRON HCL 4 MG PO TABS: 8.0000 mg | ORAL_TABLET | Freq: Every day | ORAL | Status: DC | PRN

## 2016-06-29 MED ORDER — SODIUM CHLORIDE 0.9% FLUSH: 3.0000 mL | Freq: Two times a day (BID) | INTRAVENOUS | Status: DC

## 2016-06-29 MED ORDER — SODIUM CHLORIDE 0.9 % IV SOLN
INTRAVENOUS | Status: DC
  Administered 2016-06-30: 02:00:00 via INTRAVENOUS

## 2016-06-29 MED ORDER — DIPHENHYDRAMINE HCL 50 MG/ML IJ SOLN
25.0000 mg | Freq: Four times a day (QID) | INTRAMUSCULAR | Status: DC | PRN
  Filled 2016-06-29: qty 0.5

## 2016-06-29 MED ORDER — ACETAMINOPHEN 325 MG PO TABS
650.0000 mg | ORAL_TABLET | Freq: Four times a day (QID) | ORAL | Status: DC | PRN
  Administered 2016-06-30 (×2): 650 mg via ORAL
  Filled 2016-06-29 (×2): qty 2

## 2016-06-29 MED ORDER — LORAZEPAM 2 MG/ML IJ SOLN
INTRAMUSCULAR | Status: AC
  Administered 2016-06-29: 2 mg via INTRAVENOUS
  Filled 2016-06-29: qty 1

## 2016-06-29 MED ORDER — TRAZODONE HCL 100 MG PO TABS
100.0000 mg | ORAL_TABLET | Freq: Every day | ORAL | Status: DC
  Filled 2016-06-29: qty 1

## 2016-06-29 MED ORDER — DIPHENHYDRAMINE HCL 25 MG PO CAPS: 25.0000 mg | ORAL_CAPSULE | Freq: Once | ORAL | Status: DC

## 2016-06-29 MED ORDER — LEVOTHYROXINE SODIUM 50 MCG PO TABS
50.0000 ug | ORAL_TABLET | Freq: Every day | ORAL | Status: DC
  Administered 2016-06-30: 50 ug via ORAL
  Filled 2016-06-29: qty 1

## 2016-06-29 MED ORDER — TRAZODONE HCL 100 MG PO TABS: 100.0000 mg | ORAL_TABLET | Freq: Every evening | ORAL | Status: DC | PRN

## 2016-06-29 MED ORDER — ALBUTEROL SULFATE (2.5 MG/3ML) 0.083% IN NEBU: 2.5000 mg | INHALATION_SOLUTION | RESPIRATORY_TRACT | Status: DC | PRN

## 2016-06-29 MED ORDER — PREGABALIN 50 MG PO CAPS
200.0000 mg | ORAL_CAPSULE | Freq: Three times a day (TID) | ORAL | Status: DC
Start: 2016-06-29 — End: 2016-06-30
  Filled 2016-06-29: qty 4

## 2016-06-29 MED ORDER — LORAZEPAM 0.5 MG PO TABS
0.5000 mg | ORAL_TABLET | Freq: Once | ORAL | Status: AC
  Administered 2016-06-29: 0.5 mg via ORAL
  Filled 2016-06-29: qty 1

## 2016-06-29 MED ORDER — SODIUM CHLORIDE 0.9 % IV BOLUS (SEPSIS): 1000.0000 mL | Freq: Once | INTRAVENOUS | Status: DC

## 2016-06-29 MED ORDER — DIPHENHYDRAMINE HCL 50 MG/ML IJ SOLN
25.0000 mg | Freq: Once | INTRAMUSCULAR | Status: AC
  Administered 2016-06-29: 25 mg via INTRAVENOUS
  Filled 2016-06-29: qty 1

## 2016-06-29 MED ORDER — AMPHETAMINE-DEXTROAMPHETAMINE 10 MG PO TABS: 20.0000 mg | ORAL_TABLET | Freq: Three times a day (TID) | ORAL | Status: DC

## 2016-06-29 MED ORDER — CYCLOBENZAPRINE HCL 10 MG PO TABS: 10.0000 mg | ORAL_TABLET | Freq: Three times a day (TID) | ORAL | Status: DC | PRN

## 2016-06-29 MED ORDER — LIDOCAINE 5 % EX PTCH: 2.0000 | MEDICATED_PATCH | CUTANEOUS | Status: DC

## 2016-06-29 MED ORDER — LIDOCAINE 5 % EX PTCH
2.0000 | MEDICATED_PATCH | Freq: Every day | CUTANEOUS | Status: DC
  Administered 2016-06-30: 2 via TRANSDERMAL
  Filled 2016-06-29: qty 2

## 2016-06-29 NOTE — Progress Notes (Signed)
During breakfast patient was observed by staff o be making gutteral noises in her throat and to have slightly stiff arms. Patient was attempting to speak, but was able to vocalize. Patient was brought back to the unit, via W/C and nurses assessed her. Patient continued to have guttural sounds and not be able to verbally respond to questions. VS WNL. No obvious signs of SOB.  MD notified and order received to send patient to [facility] for evaluation of a possible medical issue.  Episode ended and patient reports that she, "Just started trying to speak, but was unable to". She states she was able to hear, see and understand throughout the episode. Neuro checks negative.  EMS arrived and patient sent to ED, with staff accompanying. As she was exiting patient had another episode.

## 2016-06-29 NOTE — Progress Notes (Signed)
Patient admitted back to the unit from [facility]. Patient stating that she is anxious about her health. Will continue to monitor for safety.

## 2016-06-29 NOTE — Discharge Instructions (Signed)
Please read and follow all provided instructions.  Your diagnoses today include:  1. Psychogenic nonepileptic seizure    Tests performed today include:  Vital signs. See below for your results today.   Medications prescribed:   None  Home care instructions:  Follow any educational materials contained in this packet.  Follow-up instructions: Please follow-up with your primary care provider as needed for further evaluation of your symptoms.  Return instructions:   Please return to the Emergency Department if you experience worsening symptoms.   Please return if you have any other emergent concerns.  Additional Information:  Your vital signs today were: BP 116/85 (BP Location: Left Arm)    Pulse 87    Temp 100.3 F (37.9 C) (Oral)    Resp 18    Ht 5\' 9"  (1.753 m)    Wt 78 kg    LMP 06/26/2016    SpO2 100%    BMI 25.40 kg/m  If your blood pressure (BP) was elevated above 135/85 this visit, please have this repeated by your doctor within one month. ---------------

## 2016-06-29 NOTE — ED Triage Notes (Signed)
Per EMS, Pt sent from [facility], d/t seizure-like activity starting this morning and insomnia x 4 days.  EMS reports the Pt will chant/moan repeatedly and then, return to normal behavior.  [facility] staff reports that behavior began this morning while Pt was in the cafeteria.  [facility] staff described her behaviors as "seizure-like" activity.  Pt was going to be discharged this afternoon.  Pt c/o pain from previous injuries.    Upon arrival to ED, Pt was able to hit call light during episode.  Activity similar to hyperventilation progressing into a repetitive moan.  Pt able acknowledge this writer's questions during episode.

## 2016-06-29 NOTE — BHH Group Notes (Signed)
[facility] LCSW Group Therapy Note  06/29/2016  and  10:10 AM - 11:10  Type of Therapy and Topic:  Group Therapy: Avoiding Self-Sabotaging and Enabling Behaviors  Participation Level:  Did Not Attend; invited to participate yet did not despite overhead announcement and encouragement by staff.    Sheilah Pigeon, LCSW

## 2016-06-29 NOTE — ED Provider Notes (Signed)
[facility] DEPT Provider Note   CSN: [id number] Arrival date & time: 06/29/16  0804     History   Chief Complaint Chief Complaint  Patient presents with  . Seizure-like activity  . Insomnia    HPI Barbara Rogers is a 35 y.o. female with a hx of anxiety, chronic back pain, and recent overdose with clonidine on 10/18 in a suicide attempt who was currently being treated at behavioral health unit at [facility] presents as a transfer for evaluation after she has been having episodic episodes, some of which seemed to be pseudoseizure like (with intermittent lip smacking and grunting episodes maintaining full awareness, following commands, with no incontinence), and one of which appeared to be concerning for a seizure. She was reportedly heard falling down while the nurses were in another room and came in to find her in the curled up fetal positiion with diffuse shaking. She reportedly turned blue during this episode and was drooling. This episode was then reportedly followed by an episode of confusion concerning for postictal period. During this episode she hit her head hard on the tile floor below. She was then transferred to [facility] ED for further evaluation given concern for seizure activity. She states that she is amnestic of the episode. On arrival the patient is initially able to speak fluently without dificulty but then began making the grunting noises, looking around without fixed gaze and still following commands in all 4 extremities. These episodes were seen to wax and wane every few minutes by nursing staff and were followed by no post-ictal period, returning to her normal neurologic baseline. She complains of chronic back pain in her left upper trapezius muscle and has lidocaine patches in place. She also complains of tenderness over an occipital scalp hematoma, but denies any focal numbness, weakness.    Patient also notes history of intermittent ETOH abuse, but denies daily drinking. States that  her last drink was about 2 weeks ago.     HPI  Past Medical History:  Diagnosis Date  . AKI (acute kidney injury) ([place]) 01/18/2015  . Allergy   . Anxiety   . Asthma    exacerbated by bronchitisi  . Asthma   . C3 cervical fracture ([place])   . Cervical transverse process fracture ([place])   . Chronic left shoulder pain 08/2012  . Clavicle fracture   . Concussion 01/29/2016  . Depression   . 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 Dysmenorrhea   . Dysphagia   . Finger fracture, left 10/31/2012   LEFT 4th finger  . Insomnia   . Lumbar transverse process fracture ([place])   . Migraine without aura   . Mood disorder ([place])   . Nephrolithiasis   . PMDD (premenstrual dysphoric disorder)   . Respiratory failure ([place])   . T12 burst fracture ([place])   . Traumatic hemo-pneumothorax   . Traumatic hemopneumothorax 02/09/2016    Patient Active Problem List   Diagnosis Date Noted  . Seizure-like activity ([place]) 06/29/2016  . MDD (major depressive disorder), recurrent severe, without psychosis ([place]) 06/27/2016  . OD (overdose of drug), intentional self-harm, initial encounter ([place]) 06/26/2016  . Suicidal ideation 06/26/2016  . Clonidine overdose 06/26/2016  . Bradycardia 06/26/2016  . Left scapula fracture 02/09/2016  . Tobacco abuse   . Adjustment disorder with mixed anxiety and depressed mood   . Dysphagia   . T12 burst fracture ([place]) 01/29/2016  . C3 cervical fracture ([place]) 01/29/2016  . Fracture of left clavicle 01/29/2016  .  Multiple fractures of ribs of both sides 01/29/2016  . Cervical transverse process fracture ([place]) 01/29/2016  . Lumbar transverse process fracture ([place]) 01/29/2016  . MDD (major depressive disorder), recurrent, severe, with psychosis ([place]) 01/19/2015  . Hypokalemia 01/18/2015  . Migraine without aura   . IUD (intrauterine device) in place 06/02/2013  . Hypothyroid 12/09/2012  . Chronic left shoulder pain   . Mood disorder ([place]) 03/25/2012  . AR  (allergic rhinitis) 03/25/2012  . Dysmenorrhea 03/25/2012  . PMDD (premenstrual dysphoric disorder) 03/25/2012  . Chronic insomnia 03/25/2012    Past Surgical History:  Procedure Laterality Date  . FRACTURE SURGERY    . POSTERIOR LUMBAR FUSION 4 LEVEL N/A 01/30/2016   Procedure: T9-L2 Posterior Stabilization, Posterior Lumbar Fusion with Pedicle Screws;  Surgeon: Kary Kos, MD;  Location: [facility];  Service: Neurosurgery;  Laterality: N/A;    OB History    Gravida Para Term Preterm AB Living   3 0 0 0 3     SAB TAB Ectopic Multiple Live Births   0 3 0           Home Medications    Prior to Admission medications   Medication Sig Start Date End Date Taking? Authorizing Provider  albuterol (PROVENTIL HFA;VENTOLIN HFA) 108 (90 Base) MCG/ACT inhaler Inhale 2 puffs into the lungs every 4 (four) hours as needed for wheezing or shortness of breath (cough, shortness of breath or wheezing.). 10/24/15  Yes [name] Jeffery, PA-C  amphetamine-dextroamphetamine (ADDERALL) 20 MG tablet Take 20 mg by mouth 3 (three) times daily.   Yes Historical Provider, MD  cyclobenzaprine (FLEXERIL) 10 MG tablet Take 1 tablet (10 mg total) by mouth 3 (three) times daily as needed for muscle spasms. 03/02/16  Yes [name] Jeffery, PA-C  levothyroxine (SYNTHROID, LEVOTHROID) 50 MCG tablet Take 1 tablet (50 mcg total) by mouth daily Patient taking differently: Take 25-50 mcg by mouth daily before breakfast. Take 1 tablet (50 mcg total) by mouth daily 09/07/15  Yes [name] Jeffery, PA-C  lidocaine (LIDODERM) 5 % Place 2 patches onto the skin daily. Remove and discard patch within 12 hours. 03/14/16  Yes [name] Jeffery, PA-C  mometasone (NASONEX) 50 MCG/ACT nasal spray Place 2 sprays into the nose daily. 12/07/15  Yes [name] Jeffery, PA-C  naproxen (NAPROSYN) 500 MG tablet Take 1 tablet (500 mg total) by mouth 2 (two) times daily with a meal. 03/02/16  Yes [name] Jeffery, PA-C  ondansetron (ZOFRAN) 8 MG tablet Take 8 mg by  mouth daily as needed for nausea or vomiting. Reported on 03/26/2016   Yes Historical Provider, MD  pregabalin (LYRICA) 200 MG capsule Take 1 capsule (200 mg total) by mouth 3 (three) times daily. 01/18/16  Yes Harrison Mons, PA-C    Family History Family History  Problem Relation Age of Onset  . Migraines Mother   . Mental illness Mother   . Heart disease Father   . Alcohol abuse Father   . Arthritis Maternal Grandmother   . Thyroid disease Maternal Grandmother   . Heart disease Maternal Grandfather     AMI 1996, 2014  . Anemia Maternal Grandfather     bone marrow dysfunction    Social History Social History  Substance Use Topics  . Smoking status: Current Every Day Smoker    Packs/day: 1.00    Types: Cigarettes  . Smokeless tobacco: Never Used  . Alcohol use 0.0 oz/week     Comment: occasionally     Allergies   Penicillins; Progestins; Progestins;  Sinequan [doxepin]; and Sinequan [doxepin]   Review of Systems Review of Systems  Constitutional: Positive for activity change. Negative for chills and fever.  HENT: Negative for congestion, rhinorrhea, sinus pressure and sneezing.   Eyes: Negative for visual disturbance.  Respiratory: Negative for cough and shortness of breath.   Cardiovascular: Negative for chest pain.  Gastrointestinal: Negative for abdominal pain, nausea and vomiting.  Genitourinary: Negative for difficulty urinating and dysuria.  Musculoskeletal: Positive for back pain (chronic). Negative for neck pain.  Skin: Positive for wound (scalp hematoma).  Neurological: Positive for seizures, speech difficulty and headaches. Negative for dizziness, weakness and light-headedness.  Psychiatric/Behavioral: Negative for suicidal ideas.  All other systems reviewed and are negative.    Physical Exam Updated Vital Signs BP 127/82   Pulse (!) 121   Temp 99.1 F (37.3 C) (Oral)   Resp 22   Ht 5\' 9"  (1.753 m)   Wt 78 kg   LMP 06/26/2016   SpO2 99%   BMI  25.40 kg/m   Physical Exam  Constitutional: She is oriented to person, place, and time. She appears well-developed and well-nourished. No distress.  HENT:  Head: Normocephalic and atraumatic.  Right Ear: External ear normal.  Left Ear: External ear normal.  Nose: Nose normal.  Mouth/Throat: Oropharynx is clear and moist.  Eyes: Conjunctivae and EOM are normal. Pupils are equal, round, and reactive to light.  Mydriasis noted, but reactive and round  Neck: Normal range of motion. Neck supple.  Initially was transferred with a c-collar in place. Cleared clinically without any pain, FROM. No meningeal signs.   Cardiovascular: Normal rate, regular rhythm, normal heart sounds and intact distal pulses.   Pulmonary/Chest: Effort normal and breath sounds normal.  Abdominal: Soft. She exhibits no distension. There is no tenderness. There is no guarding.  Musculoskeletal: She exhibits no edema or tenderness.  Neurological: She is alert and oriented to person, place, and time. She has normal strength. No cranial nerve deficit or sensory deficit. Coordination normal. GCS eye subscore is 4. GCS verbal subscore is 5. GCS motor subscore is 6.  Neurologically intact between episodes of lip smacking and grunting, but during these episodes is able to follow commands with normal strength in all 4 extremities.    Skin: Skin is warm and dry. She is not diaphoretic.  Nursing note and vitals reviewed.    ED Treatments / Results  Labs (all labs ordered are listed, but only abnormal results are displayed) Labs Reviewed  CBC WITH DIFFERENTIAL/PLATELET - Abnormal; Notable for the following:       Result Value   WBC 12.0 (*)    RDW 15.6 (*)    Neutro Abs 7.9 (*)    All other components within normal limits  COMPREHENSIVE METABOLIC PANEL - Abnormal; Notable for the following:    Potassium 3.3 (*)    ALT 10 (*)    All other components within normal limits  URINALYSIS, ROUTINE W REFLEX MICROSCOPIC (NOT AT  [facility]) - Abnormal; Notable for the following:    Color, Urine STRAW (*)    Hgb urine dipstick LARGE (*)    All other components within normal limits  URINE MICROSCOPIC-ADD ON - Abnormal; Notable for the following:    Squamous Epithelial / LPF 6-30 (*)    Bacteria, UA MANY (*)    All other components within normal limits  I-STAT CHEM 8, ED - Abnormal; Notable for the following:    Calcium, Ion 1.02 (*)    All other  components within normal limits  URINE CULTURE    EKG  EKG Interpretation  Date/Time:  Saturday June 29 2016 19:02:56 EDT Ventricular Rate:  91 PR Interval:    QRS Duration: 83 QT Interval:  368 QTC Calculation: 453 R Axis:   82 Text Interpretation:  Sinus rhythm Borderline short PR interval Confirmed by Reather Converse MD, JOSHUA [phone]) on 06/29/2016 7:06:08 PM       Radiology Dg Chest 2 View  Result Date: 06/30/2016 CLINICAL DATA:  35 year old female with fever and seizure. EXAM: CHEST  2 VIEW COMPARISON:  None. FINDINGS: The lungs are clear. There is no pleural effusion or pneumothorax. The cardiac silhouette is within normal limits. Multiple old healed right posterior rib fractures. There is age indeterminate fracture of the right lateral sixth rib. There is also fracture of the lateral aspect of the more inferior ribs, possibly the eighth rib. Soft tissue prominence of the right lateral chest wall may represent a small hematoma. The bones are osteopenic for the patient's age. Thoracolumbar posterior fixation hardware. There is age indeterminate compression deformity of a lower thoracic vertebra seen on the lateral projection. IMPRESSION: No acute cardiopulmonary process. Multiple age indeterminate right lateral rib fractures as described. Correlation with clinical exam and point tenderness recommended. Age indeterminate compression deformity of a lower thoracic vertebra. Electronically Signed   By: Anner Crete M.D.   On: 06/30/2016 00:33   Ct Head Wo  Contrast  Result Date: 06/29/2016 CLINICAL DATA:  Fall. EXAM: CT HEAD WITHOUT CONTRAST TECHNIQUE: Contiguous axial images were obtained from the base of the skull through the vertex without intravenous contrast. COMPARISON:  None. FINDINGS: Brain: Ventricles, cisterns and other CSF spaces are normal. There is no mass, mass effect, shift of midline structures or acute hemorrhage. No evidence of acute infarction. Vascular: Within normal. Skull: Within normal. Sinuses/Orbits: Within normal. Other: Small scalp contusion over the left posterior parietal region IMPRESSION: No acute intracranial findings. Small left posterior parietal scalp contusion. Electronically Signed   By: Marin Olp M.D.   On: 06/29/2016 21:11    Procedures Procedures (including critical care time)  Medications Ordered in ED Medications  zolpidem (AMBIEN) tablet 5 mg (5 mg Oral Given 06/27/16 2252)  LORazepam (ATIVAN) tablet 0.5 mg (0.5 mg Oral Given 06/29/16 0915)  LORazepam (ATIVAN) injection 1 mg (2 mg Intravenous Given 06/29/16 2257)  diphenhydrAMINE (BENADRYL) injection 25 mg (25 mg Intravenous Given 06/29/16 2259)     Initial Impression / Assessment and Plan / ED Course  I have reviewed the triage vital signs and the nursing notes.  Pertinent labs & imaging results that were available during my care of the patient were reviewed by me and considered in my medical decision making (see chart for details).  Clinical Course  35 y.o. female presents with episodic episodes of seizure like episodes that were not felt to be epileptic in nature. Chem 8 was done and showed no significant abnormalities. CT head was done and showed scalp hematoma, but no acute intracranial abnormalities. Given concerning history of two different types of seizure like episodes, neurology was consulted and saw the patient at the bedside. They recommended admission for observation and EEG continuing to follow.  CBC and CMP were ordered and returned  showing mild leukocytosis of 12.0, K 3.3, but otherwise reassuring.   Around 11:00pm The patient was seen to have what appeared to be a tonic clonic seizure which lasted about 30 seconds. She was then seen to be minimally responsive and post-ictal appearing,  mouth agape, and not following commands. She was given 1 mg of ativan and 50mg  of benadryl per rec's from neurology consult with concern for possible medication dystonic reaction. This post-ictal-like period lasted for a few minutes and she was reassessed finding her again back to her neurologic baseline.   Temperature was rechecked and found to be afebrile, again with FROM of neck, low suspicion for meningitis as etiology for her sx.   Possible ETOH/benzo withdrawal seizures given tachycardia but difficult to fully ascertain in the setting of her underlying psychiatric illness, as well as last reported drink about 2 weeks prior.    She was then admitted to the hospitalist service for further care and assessment.   Final Clinical Impressions(s) / ED Diagnoses   Final diagnoses:  Psychogenic nonepileptic seizure  Fever    New Prescriptions Discharge Medication List as of 06/29/2016  9:29 AM       Zenovia Jarred, DO 06/30/16 0110    Elnora Morrison, MD 07/01/16 [id number]

## 2016-06-29 NOTE — BHH Group Notes (Signed)
Goals group  Date:  06/29/2016  Time:  0900           Type of Therapy:  Nurse Education  :  Goals group: The group focuses on teaching patients how to identify and maintain attainable goals that will aid them in their recovery.  Participation Level:  Did Not Attend  Participation Quality:  N/A  Affect:  N/A  Cognitive:  N/A  Insight: N/A    Engagement in Group:  N/A  Modes of Intervention:  N/A  Summary of Progress/Problems:N/A  Lauralyn Primes 06/29/2016, 11:12 AM

## 2016-06-29 NOTE — ED Provider Notes (Signed)
[facility] DEPT Provider Note   CSN: [id number] Arrival date & time: 06/29/16  0804     History   Chief Complaint Chief Complaint  Patient presents with  . Seizure-like activity  . Insomnia    HPI Barbara Rogers is a 35 y.o. female.  Patient, currently at [facility], for drug overdose, major depressive disorder, chronic pain syndrome, polysubstance dependence -- presents with seizure-like activity. Patient was reportedly at breakfast this morning when she began having episodes of hyperventilation, moaning, groaning. Patient is responsive during these episodes. Patient tells me that these episodes have been occuring approximately every 15 minutes. She states that she has never had these before. She states that she is awake and aware what is going on around her, however she is unable to talk. She states that her face draws up. No reported postictal state, lateral tongue biting, or incontinence. Patient quickly then changes to conversation to revolve around her chronic pain issues and how she does not feel like her pain concerns are being addressed at behavioral health. No recent falls, fever, neck pain or stiffness. No headache or confusion. The onset of this condition was acute. The course is intermittent. Aggravating factors: none. Alleviating factors: none.        Past Medical History:  Diagnosis Date  . AKI (acute kidney injury) ([place]) 01/18/2015  . Allergy   . Anxiety   . Asthma    exacerbated by bronchitisi  . Asthma   . C3 cervical fracture ([place])   . Cervical transverse process fracture ([place])   . Chronic left shoulder pain 08/2012  . Clavicle fracture   . Concussion 01/29/2016  . Depression   . Drug psychosis with hallucinations ([place]) 01/20/2015  . Dysmenorrhea   . Dysphagia   . Finger fracture, left 10/31/2012   LEFT 4th finger  . Insomnia   . Lumbar transverse process fracture ([place])   . Migraine without aura   . Mood disorder ([place])   . Nephrolithiasis     . PMDD (premenstrual dysphoric disorder)   . Respiratory failure ([place])   . T12 burst fracture ([place])   . Traumatic hemo-pneumothorax   . Traumatic hemopneumothorax 02/09/2016    Patient Active Problem List   Diagnosis Date Noted  . MDD (major depressive disorder), recurrent severe, without psychosis ([place]) 06/27/2016  . OD (overdose of drug), intentional self-harm, initial encounter ([place]) 06/26/2016  . Suicidal ideation 06/26/2016  . Clonidine overdose 06/26/2016  . Bradycardia 06/26/2016  . Left scapula fracture 02/09/2016  . Tobacco abuse   . Adjustment disorder with mixed anxiety and depressed mood   . Dysphagia   . T12 burst fracture ([place]) 01/29/2016  . C3 cervical fracture ([place]) 01/29/2016  . Fracture of left clavicle 01/29/2016  . Multiple fractures of ribs of both sides 01/29/2016  . Cervical transverse process fracture ([place]) 01/29/2016  . Lumbar transverse process fracture ([place]) 01/29/2016  . MDD (major depressive disorder), recurrent, severe, with psychosis ([place]) 01/19/2015  . Hypokalemia 01/18/2015  . Migraine without aura   . IUD (intrauterine device) in place 06/02/2013  . Hypothyroid 12/09/2012  . Chronic left shoulder pain   . Mood disorder ([place]) 03/25/2012  . AR (allergic rhinitis) 03/25/2012  . Dysmenorrhea 03/25/2012  . PMDD (premenstrual dysphoric disorder) 03/25/2012  . Chronic insomnia 03/25/2012    Past Surgical History:  Procedure Laterality Date  . FRACTURE SURGERY    . POSTERIOR LUMBAR FUSION 4 LEVEL N/A 01/30/2016   Procedure: T9-L2 Posterior Stabilization, Posterior Lumbar Fusion with  Pedicle Screws;  Surgeon: Kary Kos, MD;  Location: [facility];  Service: Neurosurgery;  Laterality: N/A;    OB History    Gravida Para Term Preterm AB Living   3 0 0 0 3     SAB TAB Ectopic Multiple Live Births   0 3 0           Home Medications    Prior to Admission medications   Medication Sig Start Date End Date Taking? Authorizing Provider  albuterol  (PROVENTIL HFA;VENTOLIN HFA) 108 (90 Base) MCG/ACT inhaler Inhale 2 puffs into the lungs every 4 (four) hours as needed for wheezing or shortness of breath (cough, shortness of breath or wheezing.). 10/24/15  Yes [name] Jeffery, PA-C  amphetamine-dextroamphetamine (ADDERALL) 20 MG tablet Take 20 mg by mouth 3 (three) times daily.   Yes Historical Provider, MD  cyclobenzaprine (FLEXERIL) 10 MG tablet Take 1 tablet (10 mg total) by mouth 3 (three) times daily as needed for muscle spasms. 03/02/16  Yes [name] Jeffery, PA-C  levothyroxine (SYNTHROID, LEVOTHROID) 50 MCG tablet Take 1 tablet (50 mcg total) by mouth daily Patient taking differently: Take 25-50 mcg by mouth daily before breakfast. Take 1 tablet (50 mcg total) by mouth daily 09/07/15  Yes [name] Jeffery, PA-C  lidocaine (LIDODERM) 5 % Place 2 patches onto the skin daily. Remove and discard patch within 12 hours. 03/14/16  Yes [name] Jeffery, PA-C  mometasone (NASONEX) 50 MCG/ACT nasal spray Place 2 sprays into the nose daily. 12/07/15  Yes [name] Jeffery, PA-C  naproxen (NAPROSYN) 500 MG tablet Take 1 tablet (500 mg total) by mouth 2 (two) times daily with a meal. 03/02/16  Yes [name] Jeffery, PA-C  ondansetron (ZOFRAN) 8 MG tablet Take 8 mg by mouth daily as needed for nausea or vomiting. Reported on 03/26/2016   Yes Historical Provider, MD  pregabalin (LYRICA) 200 MG capsule Take 1 capsule (200 mg total) by mouth 3 (three) times daily. 01/18/16  Yes Harrison Mons, PA-C    Family History Family History  Problem Relation Age of Onset  . Migraines Mother   . Mental illness Mother   . Heart disease Father   . Alcohol abuse Father   . Arthritis Maternal Grandmother   . Thyroid disease Maternal Grandmother   . Heart disease Maternal Grandfather     AMI 1996, 2014  . Anemia Maternal Grandfather     bone marrow dysfunction    Social History Social History  Substance Use Topics  . Smoking status: Current Every Day Smoker    Packs/day: 1.00      Types: Cigarettes  . Smokeless tobacco: Never Used  . Alcohol use 0.0 oz/week     Comment: occasionally     Allergies   Penicillins; Progestins; Progestins; Sinequan [doxepin]; and Sinequan [doxepin]   Review of Systems Review of Systems  Constitutional: Negative for fever.  HENT: Negative for rhinorrhea and sore throat.   Eyes: Negative for redness.  Respiratory: Negative for cough.   Cardiovascular: Negative for chest pain.  Gastrointestinal: Negative for abdominal pain, diarrhea, nausea and vomiting.  Genitourinary: Negative for difficulty urinating, dysuria and enuresis.  Musculoskeletal: Negative for myalgias.  Skin: Negative for rash.  Neurological: Positive for seizures. Negative for headaches.     Physical Exam Updated Vital Signs BP 116/85 (BP Location: Left Arm)   Pulse 87   Temp 100.3 F (37.9 C) (Oral)   Resp 18   Ht 5\' 9"  (1.753 m)   Wt 78 kg   LMP  06/26/2016   SpO2 100%   BMI 25.40 kg/m   Physical Exam  Constitutional: She is oriented to person, place, and time. She appears well-developed and well-nourished.  HENT:  Head: Normocephalic and atraumatic.  Right Ear: Tympanic membrane, external ear and ear canal normal.  Left Ear: Tympanic membrane, external ear and ear canal normal.  Nose: Nose normal.  Mouth/Throat: Uvula is midline, oropharynx is clear and moist and mucous membranes are normal.  Eyes: Conjunctivae, EOM and lids are normal. Pupils are equal, round, and reactive to light. Right eye exhibits no discharge. Left eye exhibits no discharge. Right eye exhibits no nystagmus. Left eye exhibits no nystagmus.  Neck: Normal range of motion. Neck supple.  Cardiovascular: Normal rate, regular rhythm and normal heart sounds.   Pulmonary/Chest: Effort normal and breath sounds normal.  Abdominal: Soft. There is no tenderness.  Musculoskeletal:       Cervical back: She exhibits normal range of motion, no tenderness and no bony tenderness.   Neurological: She is alert and oriented to person, place, and time. She has normal strength and normal reflexes. No cranial nerve deficit or sensory deficit. She displays a negative Romberg sign. Coordination and gait normal. GCS eye subscore is 4. GCS verbal subscore is 5. GCS motor subscore is 6.  Seizure-like activity observed. She hyperventilates and makes loud grunting sounds. She makes direct eye contact. No gaze deviation or rhythmic shaking consistent with epileptic seizure.   Skin: Skin is warm and dry.  Psychiatric: She has a normal mood and affect.  Nursing note and vitals reviewed.    ED Treatments / Results   Procedures Procedures (including critical care time)    Initial Impression / Assessment and Plan / ED Course  I have reviewed the triage vital signs and the nursing notes.  Pertinent labs & imaging results that were available during my care of the patient were reviewed by me and considered in my medical decision making (see chart for details).  Clinical Course   Patient seen and examined. Seizure activity observed. She is not having an epileptic seizure. Will give 0.5mg  PO ativan and discharge back to [facility]. Do not feel imaging or work-up indicated.   Vital signs reviewed and are as follows: BP 116/85 (BP Location: Left Arm)   Pulse 87   Temp 100.3 F (37.9 C) (Oral)   Resp 18   Ht 5\' 9"  (1.753 m)   Wt 78 kg   LMP 06/26/2016   SpO2 100%   BMI 25.40 kg/m     Final Clinical Impressions(s) / ED Diagnoses   Final diagnoses:  Psychogenic nonepileptic seizure   Non-epileptic seizure: Observed as above. No postictal state or signs of true epileptic seizure. No signs of meningitis or infectious etiology. No signs of trauma. At baseline, patient is very focused on her chronic pain and medication needs. No indication for workup or imaging at this time. I feel patient can safely be discharged back to behavioral health to continue psychiatric care.   New  Prescriptions New Prescriptions   No medications on file     Carlisle Cater, PA-C 06/29/16 0945    Leonard Schwartz, MD 06/30/16 [phone]

## 2016-06-29 NOTE — ED Notes (Signed)
No postictal state noted after episode and no urinary incontinence noted.  Pt c/o "hurting all over."  Sts she "needs some for it."  Pt informed that we have to follow the [facility] guidelines and will not be providing narcotics.  Pt sts "I need an Ativan or a Clonazepam."  Pt reports "that doctor over there knows that I'm just going to start taking it again when I get home, but she won't give it to me."  Pt taken to restroom w/o issues.  Pt began having another episode after being placed back into the bed.
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
## 2016-06-29 NOTE — ED Notes (Signed)
Admitting MD at bedside.

## 2016-06-29 NOTE — ED Notes (Signed)
MD at bedside. 

## 2016-06-29 NOTE — ED Notes (Signed)
Pt c/o 10/10 pain, refused PRN meds. EDP aware.

## 2016-06-29 NOTE — ED Notes (Signed)
Bed: [id number] Expected date:  Expected time:  Means of arrival:  Comments: EMS- pseudoseizure from [facility]

## 2016-06-29 NOTE — Progress Notes (Signed)
Patient placed on 1:1 for patient safety due to continued episodes of guttural noises and inability to vocalize. Patient informed of this and she stated, "I'm glad because I have been so worried about my health". Patient informed that staff would be in close proximity at all time and she would be within staff eyesight at all times. Currently patient is in the dayroom interacting with staff and patients. No complaints at this time.

## 2016-06-29 NOTE — ED Notes (Signed)
Notified [facility] for transport.

## 2016-06-29 NOTE — ED Notes (Signed)
While attempting to start an IV, pt began to have a tonic clonic seizure with full body movement. Pt became clinched, tachycardiac at 142, and cyanotic. Seizure type activity 30 seconds.

## 2016-06-29 NOTE — Progress Notes (Signed)
1:1 note. At 1812 patient was standing at the nurses station with staff, when she began drooling, her eyes rolled back into her head and she fell to the floor. As patient she hit the back of her head, hard, on the floor. Patient became ridged and her arms curled up into the fetal position and she started to shake. Patient began to gasp for air and she became blue in the face. At 1814 O2 was applied via mask at 3 liters. At 1817 VS were 112, BP were unable to be read. O2 sat 98% at 3L. At 1819 VS 121/61, pulse 72 and sat 85%. Order received from NP to transfer patient to the ED. 911 clalled at patient transferred to [facility] ED.  Report given to Roselyn Reef charge RN with Roselyn Reef told that our NP wants them to look int possible pupil mydrasis. EMS arrived and patient transported with staff to [facility] ED.

## 2016-06-30 ENCOUNTER — Inpatient Hospital Stay (HOSPITAL_COMMUNITY)
Admission: AD | Admit: 2016-06-30 | Discharge: 2016-06-30 | DRG: 885 | Disposition: A | Discharge status: Other Acute Inpatient Hospital | Payer: BLUE CROSS/BLUE SHIELD | Source: Other Acute Inpatient Hospital | Attending: Family Medicine | Admitting: Family Medicine

## 2016-06-30 ENCOUNTER — Inpatient Hospital Stay (HOSPITAL_COMMUNITY)
Admission: AD | Admit: 2016-06-30 | Discharge: 2016-07-01 | DRG: 885 | Disposition: A | Discharge status: Home / Self Care | Payer: BLUE CROSS/BLUE SHIELD | Source: Intra-hospital | Attending: Psychiatry | Admitting: Psychiatry

## 2016-06-30 ENCOUNTER — Observation Stay (HOSPITAL_COMMUNITY): Payer: BLUE CROSS/BLUE SHIELD

## 2016-06-30 ENCOUNTER — Encounter (HOSPITAL_COMMUNITY): Payer: Self-pay | Admitting: *Deleted

## 2016-06-30 DIAGNOSIS — T1491XA Suicide attempt, initial encounter: Secondary | ICD-10-CM

## 2016-06-30 DIAGNOSIS — F332 Major depressive disorder, recurrent severe without psychotic features: Secondary | ICD-10-CM

## 2016-06-30 DIAGNOSIS — Z981 Arthrodesis status: Secondary | ICD-10-CM | POA: Diagnosis not present

## 2016-06-30 DIAGNOSIS — R569 Unspecified convulsions: Secondary | ICD-10-CM

## 2016-06-30 DIAGNOSIS — Z811 Family history of alcohol abuse and dependence: Secondary | ICD-10-CM | POA: Diagnosis not present

## 2016-06-30 DIAGNOSIS — Z8249 Family history of ischemic heart disease and other diseases of the circulatory system: Secondary | ICD-10-CM

## 2016-06-30 DIAGNOSIS — Z818 Family history of other mental and behavioral disorders: Secondary | ICD-10-CM

## 2016-06-30 DIAGNOSIS — F1920 Other psychoactive substance dependence, uncomplicated: Secondary | ICD-10-CM | POA: Diagnosis present

## 2016-06-30 DIAGNOSIS — F17210 Nicotine dependence, cigarettes, uncomplicated: Secondary | ICD-10-CM | POA: Diagnosis present

## 2016-06-30 DIAGNOSIS — G8929 Other chronic pain: Secondary | ICD-10-CM | POA: Diagnosis present

## 2016-06-30 DIAGNOSIS — E039 Hypothyroidism, unspecified: Secondary | ICD-10-CM | POA: Diagnosis present

## 2016-06-30 DIAGNOSIS — Z8349 Family history of other endocrine, nutritional and metabolic diseases: Secondary | ICD-10-CM | POA: Diagnosis not present

## 2016-06-30 DIAGNOSIS — E876 Hypokalemia: Secondary | ICD-10-CM | POA: Diagnosis present

## 2016-06-30 DIAGNOSIS — J45909 Unspecified asthma, uncomplicated: Secondary | ICD-10-CM | POA: Diagnosis present

## 2016-06-30 DIAGNOSIS — T43212A Poisoning by selective serotonin and norepinephrine reuptake inhibitors, intentional self-harm, initial encounter: Secondary | ICD-10-CM | POA: Diagnosis not present

## 2016-06-30 DIAGNOSIS — Z8261 Family history of arthritis: Secondary | ICD-10-CM

## 2016-06-30 DIAGNOSIS — Z8489 Family history of other specified conditions: Secondary | ICD-10-CM | POA: Diagnosis not present

## 2016-06-30 DIAGNOSIS — F411 Generalized anxiety disorder: Secondary | ICD-10-CM | POA: Diagnosis present

## 2016-06-30 DIAGNOSIS — Z880 Allergy status to penicillin: Secondary | ICD-10-CM

## 2016-06-30 DIAGNOSIS — F4323 Adjustment disorder with mixed anxiety and depressed mood: Secondary | ICD-10-CM | POA: Diagnosis present

## 2016-06-30 DIAGNOSIS — Z888 Allergy status to other drugs, medicaments and biological substances status: Secondary | ICD-10-CM

## 2016-06-30 DIAGNOSIS — Z720 Tobacco use: Secondary | ICD-10-CM | POA: Diagnosis present

## 2016-06-30 DIAGNOSIS — F39 Unspecified mood [affective] disorder: Secondary | ICD-10-CM | POA: Diagnosis present

## 2016-06-30 DIAGNOSIS — F5104 Psychophysiologic insomnia: Secondary | ICD-10-CM | POA: Diagnosis present

## 2016-06-30 DIAGNOSIS — Z79899 Other long term (current) drug therapy: Secondary | ICD-10-CM | POA: Diagnosis not present

## 2016-06-30 DIAGNOSIS — Z915 Personal history of self-harm: Secondary | ICD-10-CM

## 2016-06-30 HISTORY — DX: Unspecified convulsions: R56.9

## 2016-06-30 HISTORY — DX: Major depressive disorder, recurrent severe without psychotic features: F33.2

## 2016-06-30 LAB — BASIC METABOLIC PANEL
ANION GAP: 9 (ref 5–15)
BUN: 6 mg/dL (ref 6–20)
CALCIUM: 9.2 mg/dL (ref 8.9–10.3)
CO2: 25 mmol/L (ref 22–32)
Chloride: 106 mmol/L (ref 101–111)
Creatinine, Ser: 0.59 mg/dL (ref 0.44–1.00)
GFR calc Af Amer: >60 mL/min (ref >60)
GFR calc non Af Amer: >60 mL/min (ref >60)
GLUCOSE: 97 mg/dL (ref 65–99)
Potassium: 3.4 mmol/L — ABNORMAL LOW (ref 3.5–5.1)
Sodium: 140 mmol/L (ref 135–145)

## 2016-06-30 LAB — CBC WITH DIFFERENTIAL/PLATELET
BASOS ABS: 0.1 10*3/uL (ref 0.0–0.1)
BASOS PCT: 0 %
Eosinophils Absolute: 0.1 10*3/uL (ref 0.0–0.7)
Eosinophils Relative: 1 %
HEMATOCRIT: 40.8 % (ref 36.0–46.0)
HEMOGLOBIN: 13.7 g/dL (ref 12.0–15.0)
Lymphocytes Relative: 24 %
Lymphs Abs: 2.9 10*3/uL (ref 0.7–4.0)
MCH: 29.7 pg (ref 26.0–34.0)
MCHC: 33.6 g/dL (ref 30.0–36.0)
MCV: 88.3 fL (ref 78.0–100.0)
MONOS PCT: 9 %
Monocytes Absolute: 1 10*3/uL (ref 0.1–1.0)
NEUTROS ABS: 7.9 10*3/uL — AB (ref 1.7–7.7)
NEUTROS PCT: 66 %
Platelets: 253 10*3/uL (ref 150–400)
RBC: 4.62 MIL/uL (ref 3.87–5.11)
RDW: 15.6 % — ABNORMAL HIGH (ref 11.5–15.5)
WBC: 12 10*3/uL — ABNORMAL HIGH (ref 4.0–10.5)

## 2016-06-30 LAB — CBC
HCT: 40 % (ref 36.0–46.0)
Hemoglobin: 13.2 g/dL (ref 12.0–15.0)
MCH: 29.3 pg (ref 26.0–34.0)
MCHC: 33 g/dL (ref 30.0–36.0)
MCV: 88.7 fL (ref 78.0–100.0)
PLATELETS: 238 10*3/uL (ref 150–400)
RBC: 4.51 MIL/uL (ref 3.87–5.11)
RDW: 15.7 % — AB (ref 11.5–15.5)
WBC: 9.7 10*3/uL (ref 4.0–10.5)

## 2016-06-30 LAB — RAPID URINE DRUG SCREEN, HOSP PERFORMED
Amphetamines: NOT DETECTED
BARBITURATES: NOT DETECTED
BENZODIAZEPINES: POSITIVE — AB
Cocaine: NOT DETECTED
Opiates: NOT DETECTED
Tetrahydrocannabinol: POSITIVE — AB

## 2016-06-30 LAB — LACTIC ACID, PLASMA
LACTIC ACID, VENOUS: 1 mmol/L (ref 0.5–1.9)
LACTIC ACID, VENOUS: 0.8 mmol/L (ref 0.5–1.9)

## 2016-06-30 LAB — GLUCOSE, CAPILLARY: GLUCOSE-CAPILLARY: 86 mg/dL (ref 65–99)

## 2016-06-30 LAB — COMPREHENSIVE METABOLIC PANEL
ALBUMIN: 4.1 g/dL (ref 3.5–5.0)
ALK PHOS: 95 U/L (ref 38–126)
ALT: 10 U/L — AB (ref 14–54)
AST: 25 U/L (ref 15–41)
Anion gap: 11 (ref 5–15)
BILIRUBIN TOTAL: 0.6 mg/dL (ref 0.3–1.2)
BUN: 6 mg/dL (ref 6–20)
CALCIUM: 9.5 mg/dL (ref 8.9–10.3)
CO2: 23 mmol/L (ref 22–32)
Chloride: 107 mmol/L (ref 101–111)
Creatinine, Ser: 0.77 mg/dL (ref 0.44–1.00)
GFR calc Af Amer: >60 mL/min (ref >60)
GFR calc non Af Amer: >60 mL/min (ref >60)
GLUCOSE: 98 mg/dL (ref 65–99)
Potassium: 3.3 mmol/L — ABNORMAL LOW (ref 3.5–5.1)
Sodium: 141 mmol/L (ref 135–145)
TOTAL PROTEIN: 6.7 g/dL (ref 6.5–8.1)

## 2016-06-30 LAB — PROCALCITONIN

## 2016-06-30 LAB — HCG, QUANTITATIVE, PREGNANCY: hCG, Beta Chain, Quant, S: <1 m[IU]/mL (ref <5)

## 2016-06-30 LAB — PROTIME-INR
INR: 1.02
Prothrombin Time: 13.4 seconds (ref 11.4–15.2)

## 2016-06-30 MED ORDER — METHOCARBAMOL 750 MG PO TABS
750.0000 mg | ORAL_TABLET | Freq: Three times a day (TID) | ORAL | Status: DC | PRN
  Administered 2016-07-01: 750 mg via ORAL
  Filled 2016-06-30: qty 1

## 2016-06-30 MED ORDER — AMPHETAMINE-DEXTROAMPHETAMINE 10 MG PO TABS
20.0000 mg | ORAL_TABLET | Freq: Every day | ORAL | Status: DC
  Administered 2016-07-01: 20 mg via ORAL
  Filled 2016-06-30: qty 2

## 2016-06-30 MED ORDER — MAGNESIUM HYDROXIDE 400 MG/5ML PO SUSP: 30.0000 mL | Freq: Every day | ORAL | Status: DC | PRN

## 2016-06-30 MED ORDER — KETOROLAC TROMETHAMINE 10 MG PO TABS
ORAL_TABLET | ORAL | Status: AC
  Administered 2016-06-30: 10 mg
  Filled 2016-06-30: qty 1

## 2016-06-30 MED ORDER — LEVOTHYROXINE SODIUM 50 MCG PO TABS
50.0000 ug | ORAL_TABLET | Freq: Every day | ORAL | Status: DC
  Administered 2016-07-01: 50 ug via ORAL
  Filled 2016-06-30: qty 2
  Filled 2016-06-30 (×3): qty 1

## 2016-06-30 MED ORDER — AMPHETAMINE-DEXTROAMPHETAMINE 10 MG PO TABS: 20.0000 mg | ORAL_TABLET | Freq: Every day | ORAL | Status: DC

## 2016-06-30 MED ORDER — ALUM & MAG HYDROXIDE-SIMETH 200-200-20 MG/5ML PO SUSP: 30.0000 mL | ORAL | Status: DC | PRN

## 2016-06-30 MED ORDER — PREGABALIN 100 MG PO CAPS
200.0000 mg | ORAL_CAPSULE | Freq: Three times a day (TID) | ORAL | Status: DC
  Administered 2016-06-30 – 2016-07-01 (×3): 200 mg via ORAL
  Filled 2016-06-30 (×3): qty 2

## 2016-06-30 MED ORDER — NICOTINE 21 MG/24HR TD PT24
21.0000 mg | MEDICATED_PATCH | Freq: Every day | TRANSDERMAL | Status: DC
  Administered 2016-07-01: 21 mg via TRANSDERMAL
  Filled 2016-06-30 (×3): qty 1

## 2016-06-30 MED ORDER — LORAZEPAM 2 MG/ML IJ SOLN
INTRAMUSCULAR | Status: AC
  Filled 2016-06-30: qty 1

## 2016-06-30 MED ORDER — KETOROLAC TROMETHAMINE 10 MG PO TABS
10.0000 mg | ORAL_TABLET | Freq: Once | ORAL | Status: AC
  Filled 2016-06-30: qty 1

## 2016-06-30 MED ORDER — METHOCARBAMOL 750 MG PO TABS
ORAL_TABLET | ORAL | Status: AC
  Administered 2016-06-30: 750 mg
  Filled 2016-06-30: qty 1

## 2016-06-30 MED ORDER — AMPHETAMINE-DEXTROAMPHETAMINE 10 MG PO TABS
20.0000 mg | ORAL_TABLET | Freq: Three times a day (TID) | ORAL | Status: DC
  Administered 2016-06-30 (×2): 20 mg via ORAL
  Filled 2016-06-30 (×2): qty 2

## 2016-06-30 MED ORDER — TRAZODONE HCL 50 MG PO TABS
50.0000 mg | ORAL_TABLET | Freq: Every evening | ORAL | Status: DC | PRN
  Filled 2016-06-30: qty 1

## 2016-06-30 MED ORDER — POTASSIUM CHLORIDE 20 MEQ/15ML (10%) PO SOLN
20.0000 meq | Freq: Once | ORAL | Status: AC
  Administered 2016-06-30: 20 meq via ORAL
  Filled 2016-06-30: qty 15

## 2016-06-30 MED ORDER — ACETAMINOPHEN 325 MG PO TABS
650.0000 mg | ORAL_TABLET | Freq: Four times a day (QID) | ORAL | Status: DC | PRN
  Administered 2016-06-30: 650 mg via ORAL
  Filled 2016-06-30: qty 2

## 2016-06-30 MED ORDER — CHLORDIAZEPOXIDE HCL 25 MG PO CAPS
25.0000 mg | ORAL_CAPSULE | Freq: Four times a day (QID) | ORAL | Status: DC | PRN
  Administered 2016-06-30 (×2): 25 mg via ORAL
  Filled 2016-06-30 (×2): qty 1

## 2016-06-30 NOTE — Consult Note (Signed)
[facility] Consult   Reason for Consult: transferred to [facility] from [facility] with seizure activity Referring Physician:  Dr. Jearld Shines Patient Identification: Barbara Rogers MRN:  [id number] Principal Diagnosis: MDD (major depressive disorder), recurrent severe, without psychosis ([place]) Diagnosis:   Patient Active Problem List   Diagnosis Date Noted  . Seizure-like activity ([place]) [R56.9] 06/29/2016  . MDD (major depressive disorder), recurrent severe, without psychosis ([place]) [F33.2] 06/27/2016  . OD (overdose of drug), intentional self-harm, initial encounter ([place]) [T50.902A] 06/26/2016  . Suicidal ideation [R45.851] 06/26/2016  . Clonidine overdose [T46.5X1A] 06/26/2016  . Bradycardia [R00.1] 06/26/2016  . Left scapula fracture [S42.102A] 02/09/2016  . Tobacco abuse [Z72.0]   . Adjustment disorder with mixed anxiety and depressed mood [F43.23]   . Dysphagia [R13.10]   . T12 burst fracture ([place]) [X09.407W] 01/29/2016  . C3 cervical fracture ([place]) [S12.200A] 01/29/2016  . Fracture of left clavicle [S42.002A] 01/29/2016  . Multiple fractures of ribs of both sides [S22.43XA] 01/29/2016  . Cervical transverse process fracture (HCC) [S12.9XXA] 01/29/2016  . Lumbar transverse process fracture ([place]) [S32.009A] 01/29/2016  . MDD (major depressive disorder), recurrent, severe, with psychosis ([place]) [F33.3] 01/19/2015  . Hypokalemia [E87.6] 01/18/2015  . Migraine without aura [G43.009]   . IUD (intrauterine device) in place [Z97.5] 06/02/2013  . Hypothyroid [E03.9] 12/09/2012  . Chronic left shoulder pain [M25.512, G89.29]   . Mood disorder ([place]) [F39] 03/25/2012  . AR (allergic rhinitis) [J30.9] 03/25/2012  . Dysmenorrhea [N94.6] 03/25/2012  . PMDD (premenstrual dysphoric disorder) [F32.81] 03/25/2012  . Chronic insomnia [F51.04] 03/25/2012    Total Time spent with patient: 40 minutes  Subjective:   Barbara Rogers is a 35 y.o. female patient admitted with seizure  activities.  HPI:  Patient is a 35 y.o. female who is a poor historian. However, she reports history of Anxiety, depression,  chronic pain and hypothyroidism. Patient was transferred to Neuro unit from [facility] yesterday due to seizure activities. 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 However, patient reports that she attempted suicide on 06/25/16 prior to her admission to [facility]. She reports that she overdosed on 20 tablets of Trazodone, however, charts review indicates that patient overdosed on approximately 120 tablets of clonidine on 06/24/16.  She is unclear with her dates but she made it clear that she did overdosed in an attempt to die.  She reports her current stressor as Chronic ovarian pain and   worsening depression since she lost her grandfather earlier this year. She reports being lonely, feels hopeless and often times feels miserable due to her chronic pain. Patient is unable to contract for safety but she denies delusions or psychosis.  Past Psychiatric History: as above Risk to Self:   Risk to Others:   Prior Inpatient Therapy:   Prior Outpatient Therapy:    Past Medical History:  Past Medical History:  Diagnosis Date  . AKI (acute kidney injury) ([place]) 01/18/2015  . Allergy   . Anxiety   . Asthma    exacerbated by bronchitisi  . Asthma   . C3 cervical fracture ([place])   . Cervical transverse process fracture ([place])   . Chronic left shoulder pain 08/2012  . Clavicle fracture   . Concussion 01/29/2016  . Depression   . 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 Dysmenorrhea   . Dysphagia   . Finger fracture, left 10/31/2012   LEFT 4th finger  . Insomnia   . Lumbar transverse process fracture ([place])   .  Migraine without aura   . Mood disorder ([place])   . Nephrolithiasis   . PMDD (premenstrual dysphoric disorder)   . Respiratory failure ([place])   . T12 burst fracture ([place])   . Traumatic hemo-pneumothorax   . Traumatic  hemopneumothorax 02/09/2016    Past Surgical History:  Procedure Laterality Date  . FRACTURE SURGERY    . POSTERIOR LUMBAR FUSION 4 LEVEL N/A 01/30/2016   Procedure: T9-L2 Posterior Stabilization, Posterior Lumbar Fusion with Pedicle Screws;  Surgeon: Kary Kos, MD;  Location: [place] [facility];  Service: Neurosurgery;  Laterality: N/A;   Family History:  Family History  Problem Relation Age of Onset  . Migraines Mother   . Mental illness Mother   . Heart disease Father   . Alcohol abuse Father   . Arthritis Maternal Grandmother   . Thyroid disease Maternal Grandmother   . Heart disease Maternal Grandfather     AMI 1996, 2014  . Anemia Maternal Grandfather     bone marrow dysfunction   Family Psychiatric  History:  Social History:  History  Alcohol Use  . 0.0 oz/week    Comment: occasionally     History  Drug Use No    Social History   Social History  . Marital status: Unknown    Spouse name: N/A  . Number of children: 0  . Years of education: 12   Occupational History  . [employer]  .      [employer]   Social History Main Topics  . Smoking status: Current Every Day Smoker    Packs/day: 1.00    Types: Cigarettes  . Smokeless tobacco: Never Used  . Alcohol use 0.0 oz/week     Comment: occasionally  . Drug use: No  . Sexual activity: Yes    Partners: Male    Birth control/ protection: IUD   Other Topics Concern  . None   Social History Narrative   ** Merged History Encounter **       Lives with her boyfriend, Barbara Rogers.   Additional Social History:    Allergies:   Allergies  Allergen Reactions  . Penicillins Other (See Comments)    Unknown Has patient had a PCN reaction causing immediate rash, facial/tongue/throat swelling, SOB or lightheadedness with hypotension:  unknown Has patient had a PCN reaction causing severe rash involving mucus membranes or skin necrosis:  unknown Has patient had a PCN reaction that required hospitalization  unknown Has patient had a  PCN reaction occurring within the last 10 years: unknown If all of the above answers are "NO", the  . Progestins     Extreme moodiness  . Progestins Other (See Comments)    Extreme moodiness  . Sinequan [Doxepin] Other (See Comments) and Hypertension    Hallucinations, delusions, severe agitation  . Sinequan [Doxepin] Other (See Comments)    hallucinations    Labs:  Results for orders placed or performed during the hospital encounter of 06/30/16 (from the past 48 hour(s))  hCG, quantitative, pregnancy     Status: None   Collection Time: 06/29/16 11:40 PM  Result Value Ref Range   hCG, Beta Chain, Quant, S <1 <5 mIU/mL    Comment:          GEST. AGE      CONC.  (mIU/mL)   <=1 WEEK        5 - 50     2 WEEKS       50 - 500     3 WEEKS  100 - 10,000     4 WEEKS     1,000 - 30,000     5 WEEKS     3,500 - 115,000   6-8 WEEKS     12,000 - 270,000    12 WEEKS     15,000 - 220,000        FEMALE AND NON-PREGNANT FEMALE:     LESS THAN 5 mIU/mL   Procalcitonin     Status: None   Collection Time: 06/30/16  1:35 AM  Result Value Ref Range   Procalcitonin <0.10 ng/mL    Comment:        Interpretation: PCT (Procalcitonin) <= 0.5 ng/mL: Systemic infection (sepsis) is not likely. Local bacterial infection is possible. (NOTE)         ICU PCT Algorithm               Non ICU PCT Algorithm    ----------------------------     ------------------------------         PCT < 0.25 ng/mL                 PCT < 0.1 ng/mL     Stopping of antibiotics            Stopping of antibiotics       strongly encouraged.               strongly encouraged.    ----------------------------     ------------------------------       PCT level decrease by               PCT < 0.25 ng/mL       >= 80% from peak PCT       OR PCT 0.25 - 0.5 ng/mL          Stopping of antibiotics                                             encouraged.     Stopping of antibiotics           encouraged.    ----------------------------      ------------------------------       PCT level decrease by              PCT >= 0.25 ng/mL       < 80% from peak PCT        AND PCT >= 0.5 ng/mL            Continuin g antibiotics                                              encouraged.       Continuing antibiotics            encouraged.    ----------------------------     ------------------------------     PCT level increase compared          PCT > 0.5 ng/mL         with peak PCT AND          PCT >= 0.5 ng/mL             Escalation of antibiotics  strongly encouraged.      Escalation of antibiotics        strongly encouraged.   Protime-INR     Status: None   Collection Time: 06/30/16  1:35 AM  Result Value Ref Range   Prothrombin Time 13.4 11.4 - 15.2 seconds   INR 2.08   Basic metabolic panel     Status: Abnormal   Collection Time: 06/30/16  1:35 AM  Result Value Ref Range   Sodium 140 135 - 145 mmol/L   Potassium 3.4 (L) 3.5 - 5.1 mmol/L   Chloride 106 101 - 111 mmol/L   CO2 25 22 - 32 mmol/L   Glucose, Bld 97 65 - 99 mg/dL   BUN 6 6 - 20 mg/dL   Creatinine, Ser 0.59 0.44 - 1.00 mg/dL   Calcium 9.2 8.9 - 10.3 mg/dL   GFR calc non Af Amer >60 >60 mL/min   GFR calc Af Amer >60 >60 mL/min    Comment: (NOTE) The eGFR has been calculated using the CKD EPI equation. This calculation has not been validated in all clinical situations. eGFR's persistently <60 mL/min signify possible Chronic Kidney Disease.    Anion gap 9 5 - 15  Lactic acid, plasma     Status: None   Collection Time: 06/30/16  1:35 AM  Result Value Ref Range   Lactic Acid, Venous 1.0 0.5 - 1.9 mmol/L  Lactic acid, plasma     Status: None   Collection Time: 06/30/16  5:40 AM  Result Value Ref Range   Lactic Acid, Venous 0.8 0.5 - 1.9 mmol/L  Glucose, capillary     Status: None   Collection Time: 06/30/16  7:33 AM  Result Value Ref Range   Glucose-Capillary 86 65 - 99 mg/dL   Comment 1 Notify RN   Urine rapid drug  screen (hosp performed)     Status: Abnormal   Collection Time: 06/30/16  8:37 AM  Result Value Ref Range   Opiates NONE DETECTED NONE DETECTED   Cocaine NONE DETECTED NONE DETECTED   Benzodiazepines POSITIVE (A) NONE DETECTED   Amphetamines NONE DETECTED NONE DETECTED   Tetrahydrocannabinol POSITIVE (A) NONE DETECTED   Barbiturates NONE DETECTED NONE DETECTED    Comment:        DRUG SCREEN FOR MEDICAL PURPOSES ONLY.  IF CONFIRMATION IS NEEDED FOR ANY PURPOSE, NOTIFY LAB WITHIN 5 DAYS.        LOWEST DETECTABLE LIMITS FOR URINE DRUG SCREEN Drug Class       Cutoff (ng/mL) Amphetamine      1000 Barbiturate      200 Benzodiazepine   138 Tricyclics       871 Opiates          300 Cocaine          300 THC              50   CBC     Status: Abnormal   Collection Time: 06/30/16 10:12 AM  Result Value Ref Range   WBC 9.7 4.0 - 10.5 K/uL   RBC 4.51 3.87 - 5.11 MIL/uL   Hemoglobin 13.2 12.0 - 15.0 g/dL   HCT 40.0 36.0 - 46.0 %   MCV 88.7 78.0 - 100.0 fL   MCH 29.3 26.0 - 34.0 pg   MCHC 33.0 30.0 - 36.0 g/dL   RDW 15.7 (H) 11.5 - 15.5 %   Platelets 238 150 - 400 K/uL    Current Facility-Administered Medications  Medication Dose Route  Frequency Provider Last Rate Last Dose  . 0.9 %  sodium chloride infusion   Intravenous Continuous Ivor Costa, MD 100 mL/hr at 06/30/16 0206    . acetaminophen (TYLENOL) tablet 650 mg  650 mg Oral Q6H PRN Wallis Bamberg, MD   650 mg at 06/30/16 1304  . albuterol (PROVENTIL) (2.5 MG/3ML) 0.083% nebulizer solution 2.5 mg  2.5 mg Nebulization Q4H PRN Ivor Costa, MD      . Derrill Memo ON 07/01/2016] amphetamine-dextroamphetamine (ADDERALL) tablet 20 mg  20 mg Oral Q breakfast Mylisa Brunson, MD      . cyclobenzaprine (FLEXERIL) tablet 10 mg  10 mg Oral TID PRN Ivor Costa, MD      . diphenhydrAMINE (BENADRYL) 25 mg in sodium chloride 0.9 % 50 mL IVPB  25 mg Intravenous Q6H PRN Ivor Costa, MD      . fluticasone (FLONASE) 50 MCG/ACT nasal spray 1 spray  1 spray  Each Nare Daily Ivor Costa, MD      . levothyroxine (SYNTHROID, LEVOTHROID) tablet 50 mcg  50 mcg Oral QAC breakfast Ivor Costa, MD      . lidocaine (LIDODERM) 5 % 2 patch  2 patch Transdermal QHS Ivor Costa, MD   2 patch at 06/30/16 0155  . LORazepam (ATIVAN) 2 MG/ML injection           . LORazepam (ATIVAN) injection 0.5 mg  0.5 mg Intravenous Q4H PRN Ivor Costa, MD   0.5 mg at 06/30/16 1038  . naproxen (NAPROSYN) tablet 500 mg  500 mg Oral BID WC Ivor Costa, MD   500 mg at 06/30/16 1305  . nicotine (NICODERM CQ - dosed in mg/24 hours) patch 21 mg  21 mg Transdermal Daily Ivor Costa, MD   21 mg at 06/30/16 1304  . pregabalin (LYRICA) capsule 200 mg  200 mg Oral TID Ivor Costa, MD      . sodium chloride 0.9 % bolus 1,000 mL  1,000 mL Intravenous Once Ivor Costa, MD      . sodium chloride flush (NS) 0.9 % injection 3 mL  3 mL Intravenous Q12H Ivor Costa, MD      . traZODone (DESYREL) tablet 100 mg  100 mg Oral QHS Ivor Costa, MD        Musculoskeletal: Strength & Muscle Tone: within normal limits Gait & Station: normal Patient leans: N/A  Psychiatric Specialty Exam: Physical Exam  Psychiatric: Her mood appears anxious. Her speech is slurred. She is slowed and withdrawn. Cognition and memory are normal. She expresses impulsivity. She exhibits a depressed mood. She expresses suicidal ideation.    Review of Systems  Constitutional: Positive for malaise/fatigue.  HENT: Negative.   Eyes: Negative.   Respiratory: Negative.   Cardiovascular: Negative.   Gastrointestinal: Negative.   Genitourinary: Negative.   Skin: Negative.   Endo/Heme/Allergies: Negative.   Psychiatric/Behavioral: Positive for depression and suicidal ideas. The patient is nervous/anxious and has insomnia.     Blood pressure 113/67, pulse 77, temperature 98.6 F (37 C), resp. rate 17, last menstrual period 06/26/2016, SpO2 98 %.There is no height or weight on file to calculate BMI.  General Appearance: Casual  Eye Contact:   Minimal  Speech:  Blocked and Pressured  Volume:  Decreased  Mood:  Anxious and Depressed  Affect:  Blunt and Constricted  Thought Process:  Disorganized  Orientation:  Full (Time, Place, and Person)  Thought Content:  Tangential  Suicidal Thoughts:  Yes.  without intent/plan  Homicidal Thoughts:  No  Memory:  Immediate;   Fair Recent;   Fair Remote;   Fair  Judgement:  Poor  Insight:  Shallow  Psychomotor Activity:  Psychomotor Retardation  Concentration:  Concentration: Fair and Attention Span: Fair  Recall:  Poor  Fund of Knowledge:  Fair  Language:  Fair  Akathisia:  No  Handed:  Right  AIMS (if indicated):     Assets:  Communication Skills  ADL's:  Intact  Cognition:  WNL  Sleep:   poor     Treatment Plan Summary: Daily contact with patient to assess and evaluate symptoms and progress in treatment -Continue 1:1 Sitter for safety -Decrease Amphetamine to 20 mg daily due to Insomnia/anxiety. -Continue Trazodone 100 mg Qhs for sleep.  Disposition: Recommend psychiatric Inpatient admission when medically cleared. Supportive therapy provided about ongoing stressors. Unit social worker to assist patient placement in [facility] for stabilization  Corena Pilgrim, MD 06/30/2016 1:40 PM

## 2016-06-30 NOTE — Progress Notes (Signed)
Patient ID: Barbara Rogers, female   DOB: 05-14-1981, 35 y.o.   MRN: [id number]     35 year old white female admitted after being admitted to the medical floor. Pt was medically cleared and it was recommended that patient be sent back to [facility]. At time of admission patient did not want to sign any paperwork, she reported that she just wanted to be discharged. Pt reported that she did not know why she was ever admitted back here. Pt reported that she was not depressed and was not suicidal, she just wanted to go home. Pt reported that she did not need to be in the hospital where they were not going to give her her pain medication. Pt reported that she would be better off at home. Pt had no insight and was not willing to work with staff. Pt was placed back on a 1:1 due to safety, at this time patients safety maintained.

## 2016-06-30 NOTE — Progress Notes (Signed)
STAT EEG Completed; Results Pending   

## 2016-06-30 NOTE — Discharge Summary (Signed)
Physician Discharge Summary  Barbara Rogers [id number] DOB: April 10, 1981 DOA: 06/30/2016  PCP: Harrison Mons, PA-C  Admit date: 06/30/2016 Discharge date: 06/30/2016  Admitted From: [facility] Disposition:  [place]  Recommendations for Outpatient Follow-up:  1. Follow up with PCP in 1-2 weeks 2. Please obtain BMP/CBC in one week 3. Medically cleared for discharge- can discharge to [facility]:No  Equipment/Devices:None   Discharge Condition: Stable CODE STATUS:FUll  Diet recommendation: General   Brief/Interim Summary: Barbara Rogers is a 35 y.o. female with medical history significant of asthma, hypothyroidism, depression, anxiety, traumatic hemopneumothorax, mood disorder, adjustment disorder, C3 cervical fracture, tobacco abuse, who presents with seizure-like activity.  Pt was recently admitted from 10/18-10/19 due to clonidine overdose. She was discharged to behavioral unit. Today pt was noted to have seizure-like activity, therefore transported to ED for further evaluation.  Per report,  her eyes rolled back into her head and she fell to the floor. She injured her head. Patient became ridged and her arms curled up into the fetal position. Pt had spell of drooling, and shaking as well as cyanosis. Urine drug screen on 06/26/2016 was positive for opiates, benzodiazepines and THC. She reportedly was taking Xanax prior to admission, which has been discontinued. When I saw pt in ED, she is calm, oriented 3. Moves all extremities. She states that she has pain all over, but could not give specific information to characterize her pain. She denies cough, chest pain, shortness breath, nausea, vomiting, abdominal pain, symptoms of UTI or diarrhea. She states she used to drink alcohol intermittently, but did not drink alcohol for more than 2 weeks.  EEG was performed after admission and was negative.  Vital signs were within normal limits and she was re- evaluated by psychiatry who stated  patient was appropriate for behavioral health hospital. She was discharged to [facility].  Discharge Diagnoses:  Principal Problem:   MDD (major depressive disorder), recurrent severe, without psychosis ([place]) Active Problems:   Mood disorder ([place])   Hypothyroid   Hypokalemia   Tobacco abuse   Adjustment disorder with mixed anxiety and depressed mood   Seizure-like activity ([place])    Discharge Instructions  Discharge Instructions    Call MD for:  persistant nausea and vomiting    Complete by:  As directed    Call MD for:  severe uncontrolled pain    Complete by:  As directed    Call MD for:  temperature >100.4    Complete by:  As directed    Diet general    Complete by:  As directed    Increase activity slowly    Complete by:  As directed        Medication List    TAKE these medications   albuterol 108 (90 Base) MCG/ACT inhaler Commonly known as:  PROVENTIL HFA;VENTOLIN HFA Inhale 2 puffs into the lungs every 4 (four) hours as needed for wheezing or shortness of breath (cough, shortness of breath or wheezing.).   amphetamine-dextroamphetamine 20 MG tablet Commonly known as:  ADDERALL Take 20 mg by mouth 3 (three) times daily.   cyclobenzaprine 10 MG tablet Commonly known as:  FLEXERIL Take 1 tablet (10 mg total) by mouth 3 (three) times daily as needed for muscle spasms.   levothyroxine 50 MCG tablet Commonly known as:  SYNTHROID, LEVOTHROID Take 1 tablet (50 mcg total) by mouth daily What changed:  how much to take  how to take this  when to take this  additional instructions  lidocaine 5 % Commonly known as:  LIDODERM Place 2 patches onto the skin daily. Remove and discard patch within 12 hours. What changed:  additional instructions   mometasone 50 MCG/ACT nasal spray Commonly known as:  NASONEX Place 2 sprays into the nose daily.   pregabalin 200 MG capsule Commonly known as:  LYRICA Take 1 capsule (200 mg total) by mouth 3 (three) times daily.        Allergies  Allergen Reactions  . Penicillins Other (See Comments)    Unknown Has patient had a PCN reaction causing immediate rash, facial/tongue/throat swelling, SOB or lightheadedness with hypotension:  unknown Has patient had a PCN reaction causing severe rash involving mucus membranes or skin necrosis:  unknown Has patient had a PCN reaction that required hospitalization  unknown Has patient had a PCN reaction occurring within the last 10 years: unknown If all of the above answers are "NO", the  . Progestins     Extreme moodiness  . Progestins Other (See Comments)    Extreme moodiness  . Sinequan [Doxepin] Other (See Comments) and Hypertension    Hallucinations, delusions, severe agitation  . Sinequan [Doxepin] Other (See Comments)    hallucinations    Consultations:  Neurology   Procedures/Studies: Dg Chest 2 View  Result Date: 06/30/2016 CLINICAL DATA:  35 year old female with fever and seizure. EXAM: CHEST  2 VIEW COMPARISON:  None. FINDINGS: The lungs are clear. There is no pleural effusion or pneumothorax. The cardiac silhouette is within normal limits. Multiple old healed right posterior rib fractures. There is age indeterminate fracture of the right lateral sixth rib. There is also fracture of the lateral aspect of the more inferior ribs, possibly the eighth rib. Soft tissue prominence of the right lateral chest wall may represent a small hematoma. The bones are osteopenic for the patient's age. Thoracolumbar posterior fixation hardware. There is age indeterminate compression deformity of a lower thoracic vertebra seen on the lateral projection. IMPRESSION: No acute cardiopulmonary process. Multiple age indeterminate right lateral rib fractures as described. Correlation with clinical exam and point tenderness recommended. Age indeterminate compression deformity of a lower thoracic vertebra. Electronically Signed   By: Anner Crete M.D.   On: 06/30/2016 00:33   Ct  Head Wo Contrast  Result Date: 06/29/2016 CLINICAL DATA:  Fall. EXAM: CT HEAD WITHOUT CONTRAST TECHNIQUE: Contiguous axial images were obtained from the base of the skull through the vertex without intravenous contrast. COMPARISON:  None. FINDINGS: Brain: Ventricles, cisterns and other CSF spaces are normal. There is no mass, mass effect, shift of midline structures or acute hemorrhage. No evidence of acute infarction. Vascular: Within normal. Skull: Within normal. Sinuses/Orbits: Within normal. Other: Small scalp contusion over the left posterior parietal region IMPRESSION: No acute intracranial findings. Small left posterior parietal scalp contusion. Electronically Signed   By: Marin Olp M.D.   On: 06/29/2016 21:11       Subjective: Patient reports she is feeling better this morning.  States she believes she was to be discharged from behavioral health hospital soon and does not think she needs to go back there. Patient EEG performed this morning and is negative.  Discharge Exam: Vitals:   06/30/16 1100 06/30/16 1419  BP: 113/67 127/72  Pulse: 77 84  Resp: 17 16  Temp: 98.6 F (37 C) 98.8 F (37.1 C)   Vitals:   06/30/16 0100 06/30/16 0539 06/30/16 1100 06/30/16 1419  BP: 118/74 101/69 113/67 127/72  Pulse: 90 79 77 84  Resp: 18  18 17 16   Temp: 98.7 F (37.1 C) 98.5 F (36.9 C) 98.6 F (37 C) 98.8 F (37.1 C)  TempSrc: Oral Oral  Oral  SpO2: 98%       General: Pt is alert, awake, not in acute distress Cardiovascular: RRR, S1/S2 +, no rubs, no gallops Respiratory: CTA bilaterally, no wheezing, no rhonchi Abdominal: Soft, NT, ND, bowel sounds + Extremities: no edema, no cyanosis    The results of significant diagnostics from this hospitalization (including imaging, microbiology, ancillary and laboratory) are listed below for reference.     Microbiology: Recent Results (from the past 240 hour(s))  MRSA PCR Screening     Status: None   Collection Time: 06/26/16   5:23 PM  Result Value Ref Range Status   MRSA by PCR NEGATIVE NEGATIVE Final    Comment:        The GeneXpert MRSA Assay (FDA approved for NASAL specimens only), is one component of a comprehensive MRSA colonization surveillance program. It is not intended to diagnose MRSA infection nor to guide or monitor treatment for MRSA infections.      Labs: BNP (last 3 results) No results for input(s): BNP in the last 8760 hours. Basic Metabolic Panel:  Recent Labs Lab 06/26/16 0503 06/26/16 1609 06/27/16 0410 06/29/16 2016 06/29/16 2333 06/30/16 0135  NA 139  --  138 140 141 140  K 3.1* 3.9 3.9 4.2 3.3* 3.4*  CL 109  --  110 106 107 106  CO2 20*  --  19*  --  23 25  GLUCOSE 197*  --  104* 88 98 97  BUN 10  --  8 8 6 6   CREATININE 0.64  --  0.60 0.60 0.77 0.59  CALCIUM 8.8*  --  8.4*  --  9.5 9.2  MG  --  1.8  --   --   --   --    Liver Function Tests:  Recent Labs Lab 06/26/16 0503 06/29/16 2333  AST 15 25  ALT 14 10*  ALKPHOS 108 95  BILITOT 0.5 0.6  PROT 7.2 6.7  ALBUMIN 4.0 4.1   No results for input(s): LIPASE, AMYLASE in the last 168 hours. No results for input(s): AMMONIA in the last 168 hours. CBC:  Recent Labs Lab 06/26/16 0503 06/27/16 0410 06/29/16 2016 06/29/16 2333 06/30/16 1012  WBC 10.7* 11.9*  --  12.0* 9.7  NEUTROABS 6.7  --   --  7.9*  --   HGB 14.6 13.6 13.6 13.7 13.2  HCT 42.5 40.9 40.0 40.8 40.0  MCV 86.7 89.3  --  88.3 88.7  PLT 277 216  --  253 238   Cardiac Enzymes:  Recent Labs Lab 06/26/16 0509  TROPONINI <0.03   BNP: Invalid input(s): POCBNP CBG:  Recent Labs Lab 06/26/16 0456 06/26/16 1552 06/30/16 0733  GLUCAP 210* 140* 86   D-Dimer No results for input(s): DDIMER in the last 72 hours. Hgb A1c No results for input(s): HGBA1C in the last 72 hours. Lipid Profile No results for input(s): CHOL, HDL, LDLCALC, TRIG, CHOLHDL, LDLDIRECT in the last 72 hours. Thyroid function studies No results for input(s): TSH,  T4TOTAL, T3FREE, THYROIDAB in the last 72 hours.  Invalid input(s): FREET3 Anemia work up No results for input(s): VITAMINB12, FOLATE, FERRITIN, TIBC, IRON, RETICCTPCT in the last 72 hours. Urinalysis    Component Value Date/Time   COLORURINE STRAW (A) 06/29/2016 2319   APPEARANCEUR CLEAR 06/29/2016 2319   LABSPEC 1.010 06/29/2016 2319   PHURINE  7.0 06/29/2016 2319   GLUCOSEU NEGATIVE 06/29/2016 2319   HGBUR LARGE (A) 06/29/2016 2319   BILIRUBINUR NEGATIVE 06/29/2016 2319   BILIRUBINUR negative 07/16/2015 1325   BILIRUBINUR small 08/18/2013 0841   KETONESUR NEGATIVE 06/29/2016 2319   PROTEINUR NEGATIVE 06/29/2016 2319   UROBILINOGEN 0.2 07/16/2015 1325   UROBILINOGEN 0.2 01/18/2015 0215   NITRITE NEGATIVE 06/29/2016 2319   LEUKOCYTESUR NEGATIVE 06/29/2016 2319   Sepsis Labs Invalid input(s): PROCALCITONIN,  WBC,  LACTICIDVEN Microbiology Recent Results (from the past 240 hour(s))  MRSA PCR Screening     Status: None   Collection Time: 06/26/16  5:23 PM  Result Value Ref Range Status   MRSA by PCR NEGATIVE NEGATIVE Final    Comment:        The GeneXpert MRSA Assay (FDA approved for NASAL specimens only), is one component of a comprehensive MRSA colonization surveillance program. It is not intended to diagnose MRSA infection nor to guide or monitor treatment for MRSA infections.      Time coordinating discharge: Less than 30 minutes  SIGNED:   Newman Pies, MD  [facility] 06/30/2016, 2:58 PM Pager [phone] If 7PM-7AM, please contact night-coverage [email or web address] Password [email or web address]

## 2016-06-30 NOTE — Progress Notes (Deleted)
Patient ID: Barbara Rogers, female   DOB: 1981-04-29, 35 y.o.   MRN: [id number]    D: Pt was suppose to be discharged today, he was being discharged when it was discovered that his belongings were missing. Pt refused to be discharged, he reported that he had nothing so he could not be discharged. Pt was brought back to unit, he became very anxious and very tearful. Otila Kluver [facility] was made aware of situation, at this time it has not been decided as to weather the patient would be discharged or not. Tanika NP was made aware of situation, new orders were noted to give Vistaril 50mg  as a one time dose. Pt reported that his depression was a 5, his hopelessness was a 3, and his anxiety was a 7. Pt reported being negative SI/HI, no AH/VH noted. A: 15 min checks continued for patient safety. R: Pt safety maintained.

## 2016-06-30 NOTE — ED Notes (Signed)
35 YO F with hx depression, polysubstance abuse, and suicide attempt presents with tonic clonic seizure activity, no known prior hx of same. Pt previously at [facility] for drug overdose when seizure activity began, pt arrives with sitter.  Jurni Cesaro RN #

## 2016-06-30 NOTE — Progress Notes (Signed)
PROGRESS NOTE    [name]  [id number] DOB: 1981-07-03 DOA: 06/30/2016 PCP: Harrison Mons, PA-C    Brief Narrative:  Barbara Rogers is a 35 y.o. female with medical history significant of asthma, hypothyroidism, depression, anxiety, traumatic hemopneumothorax, mood disorder, adjustment disorder, C3 cervical fracture, tobacco abuse, who presents with seizure-like activity.  Pt was recently admitted from 10/18-10/19 due to clonidine overdose. She was discharged to behavioral unit. Today pt was noted to have seizure-like activity, therefore transported to ED for further evaluation.  Per report,  her eyes rolled back into her head and she fell to the floor. She injured her head. Patient became ridged and her arms curled up into the fetal position. Pt had spell of drooling, and shaking as well as cyanosis. Urine drug screen on 06/26/2016 was positive for opiates, benzodiazepines and THC. She reportedly was taking Xanax prior to admission, which has been discontinued. When I saw pt in ED, she is calm, oriented 3. Moves all extremities. She states that she has pain all over, but could not give specific information to characterize her pain. She denies cough, chest pain, shortness breath, nausea, vomiting, abdominal pain, symptoms of UTI or diarrhea. She states she used to drink alcohol intermittently, but did not drink alcohol for more than 2 weeks.  Pt was found to have WBC 12, potassium 3.3, urinalysis negative, temperature 100.3, tachycardia, tachypnea, negative chest x-ray, negative CT head for acute intracranial abnormalities. Patient is admitted to telemetry bed as inpatient. Neurology, Dr. Nicole Kindred was consulted. EEG was ordered and was negative.   Assessment & Plan:   Principal Problem:   Seizure-like activity (HCC) Active Problems:   Mood disorder ([place])   Hypothyroid   Hypokalemia   Tobacco abuse   Adjustment disorder with mixed anxiety and depressed mood   MDD (major  depressive disorder), recurrent severe, without psychosis ([place])   Seizure-like activity: Etiology is not clear. Patient has mild fever and leukocytosis, but neck supple, and likely to have meningitis. Neurology was consulted. Per Dr. 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  -will admit to tele bed as inpt. -Seizure precaution -Highly appreciate Dr. 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 -continue Adderall - will discontinue trazadone as patient states she has not been taking it -suicide precaution -sitter at bed side  Hypothyroidism: Last TSH was 1.045 on 06/26/16 -Continue home Synthroid  Tobacco abuse: -Did counseling about importance of quitting smoking -Nicotine patch  Hypokalemia: K= 3.3  on admission. - Repleted - repeat of 3.4  Fever and leukocytosis: No source of infection identified. Urinalysis negative. Chest x-ray is negative. Likely due to seizure and stress-induced demargination. -Follow-up CBC pending   DVT ppx: SCD Code Status: Full code Family Communication: None at bed side. Disposition Plan:  Anticipate discharge back to [facility] when cleared    Consultants:   Neurology  Procedures:   None  Antimicrobials:   None    Subjective: Patient reports she feels tired.  Mentions she has not slept in 4 days.  Did sleep when in the hospital.  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   Patient states she is somewhat hungry.  No nausea, no chest pain, no chest pressure. Confusion surrounding the past 4 days at [facility] and previous admission at [facility].  Objective: Vitals:   06/30/16 0100 06/30/16 0539  BP: 118/74 101/69  Pulse: 90 79  Resp: 18 18  Temp: 98.7 F (37.1 C) 98.5 F (36.9 C)  TempSrc: Oral Oral  SpO2: 98%     Intake/Output Summary (Last 24 hours) at 06/30/16 0716 Last data filed at 06/30/16 0206  Gross per 24 hour  Intake                0 ml  Output                0 ml  Net                0 ml   There were no vitals filed for this visit.  Examination:  General exam: Appears calm and comfortable  Respiratory system: Clear to auscultation. Respiratory effort normal. Cardiovascular system: S1 & S2 heard, RRR. No JVD, murmurs, rubs, gallops or clicks. No pedal edema. Gastrointestinal system: Abdomen is nondistended, soft and nontender. No organomegaly or masses felt. Normal bowel sounds heard. Central nervous system: Alert and oriented to person, place, time and situation. No focal neurological deficits. Extremities: Symmetric 5 x 5 power. Skin: No rashes, lesions or ulcers Psychiatry: Judgement and insight appear normal. Mood & affect appropriate.     Data Reviewed: I have personally reviewed following labs and imaging studies  CBC:  Recent Labs Lab 06/26/16 0503 06/27/16 0410 06/29/16 2016 06/29/16 2333  WBC 10.7* 11.9*  --  12.0*  NEUTROABS 6.7  --   --  7.9*  HGB 14.6 13.6 13.6 13.7  HCT 42.5 40.9 40.0 40.8  MCV 86.7 89.3  --  88.3  PLT 277 216  --  123456   Basic Metabolic Panel:  Recent Labs Lab 06/26/16 0503 06/26/16 1609 06/27/16 0410 06/29/16 2016 06/29/16 2333 06/30/16 0135  NA 139  --  138 140 141 140  K 3.1* 3.9 3.9 4.2 3.3* 3.4*  CL 109  --  110 106 107 106  CO2 20*  --  19*  --  23 25  GLUCOSE 197*  --  104* 88 98 97  BUN 10  --  8 8 6 6   CREATININE 0.64  --  0.60 0.60 0.77 0.59  CALCIUM 8.8*  --  8.4*   --  9.5 9.2  MG  --  1.8  --   --   --   --    GFR: Estimated Creatinine Clearance: 102.6 mL/min (by C-G formula based on SCr of 0.59 mg/dL). Liver Function Tests:  Recent Labs Lab 06/26/16 0503 06/29/16 2333  AST 15 25  ALT 14 10*  ALKPHOS 108 95  BILITOT 0.5 0.6  PROT 7.2 6.7  ALBUMIN 4.0 4.1   No results for input(s): LIPASE, AMYLASE in the last 168 hours. No results for input(s): AMMONIA in the last 168 hours. Coagulation Profile:  Recent Labs Lab 06/30/16 0135  INR 1.02   Cardiac Enzymes:  Recent Labs Lab 06/26/16 0509  TROPONINI <0.03   BNP (last 3 results) No results for input(s): PROBNP in the last 8760 hours. HbA1C: No results for input(s): HGBA1C in the last 72 hours. CBG:  Recent Labs Lab 06/26/16 0456 06/26/16 1552  GLUCAP 210* 140*   Lipid Profile: No results for  input(s): CHOL, HDL, LDLCALC, TRIG, CHOLHDL, LDLDIRECT in the last 72 hours. Thyroid Function Tests: No results for input(s): TSH, T4TOTAL, FREET4, T3FREE, THYROIDAB in the last 72 hours. Anemia Panel: No results for input(s): VITAMINB12, FOLATE, FERRITIN, TIBC, IRON, RETICCTPCT in the last 72 hours. Sepsis Labs:  Recent Labs Lab 06/30/16 0135 06/30/16 0540  PROCALCITON <0.10  --   LATICACIDVEN 1.0 0.8    Recent Results (from the past 240 hour(s))  MRSA PCR Screening     Status: None   Collection Time: 06/26/16  5:23 PM  Result Value Ref Range Status   MRSA by PCR NEGATIVE NEGATIVE Final    Comment:        The GeneXpert MRSA Assay (FDA approved for NASAL specimens only), is one component of a comprehensive MRSA colonization surveillance program. It is not intended to diagnose MRSA infection nor to guide or monitor treatment for MRSA infections.          Radiology Studies: Dg Chest 2 View  Result Date: 06/30/2016 CLINICAL DATA:  35 year old female with fever and seizure. EXAM: CHEST  2 VIEW COMPARISON:  None. FINDINGS: The lungs are clear. There is no  pleural effusion or pneumothorax. The cardiac silhouette is within normal limits. Multiple old healed right posterior rib fractures. There is age indeterminate fracture of the right lateral sixth rib. There is also fracture of the lateral aspect of the more inferior ribs, possibly the eighth rib. Soft tissue prominence of the right lateral chest wall may represent a small hematoma. The bones are osteopenic for the patient's age. Thoracolumbar posterior fixation hardware. There is age indeterminate compression deformity of a lower thoracic vertebra seen on the lateral projection. IMPRESSION: No acute cardiopulmonary process. Multiple age indeterminate right lateral rib fractures as described. Correlation with clinical exam and point tenderness recommended. Age indeterminate compression deformity of a lower thoracic vertebra. Electronically Signed   By: Anner Crete M.D.   On: 06/30/2016 00:33   Ct Head Wo Contrast  Result Date: 06/29/2016 CLINICAL DATA:  Fall. EXAM: CT HEAD WITHOUT CONTRAST TECHNIQUE: Contiguous axial images were obtained from the base of the skull through the vertex without intravenous contrast. COMPARISON:  None. FINDINGS: Brain: Ventricles, cisterns and other CSF spaces are normal. There is no mass, mass effect, shift of midline structures or acute hemorrhage. No evidence of acute infarction. Vascular: Within normal. Skull: Within normal. Sinuses/Orbits: Within normal. Other: Small scalp contusion over the left posterior parietal region IMPRESSION: No acute intracranial findings. Small left posterior parietal scalp contusion. Electronically Signed   By: Marin Olp M.D.   On: 06/29/2016 21:11        Scheduled Meds: . amphetamine-dextroamphetamine  20 mg Oral TID WC  . fluticasone  1 spray Each Nare Daily  . levothyroxine  50 mcg Oral QAC breakfast  . lidocaine  2 patch Transdermal QHS  . naproxen  500 mg Oral BID WC  . nicotine  21 mg Transdermal Daily  . potassium  chloride  20 mEq Oral Once  . pregabalin  200 mg Oral TID  . sodium chloride  1,000 mL Intravenous Once  . sodium chloride flush  3 mL Intravenous Q12H  . traZODone  100 mg Oral QHS   Continuous Infusions: . sodium chloride 100 mL/hr at 06/30/16 0206     LOS: 0 days    Time spent: 35 minutes    Newman Pies, MD [facility] Pager [phone]  If 7PM-7AM, please contact night-coverage [email or web address] Password [email or web address] 06/30/2016, 7:16 AM

## 2016-06-30 NOTE — H&P (Signed)
History and Physical    Barbara Rogers [id number] DOB: December 08, 1980 DOA: 06/30/2016  Referring MD/NP/PA:   PCP: Harrison Mons, PA-C   Patient coming from:  The patient is coming from home.  At baseline, pt is independent for most of ADL. SNF  Assistant living facility   [facility]irement center.       Chief Complaint: seizure-like activity.  HPI: Barbara Rogers is a 35 y.o. female with medical history significant of asthma, hypothyroidism, depression, anxiety, traumatic hemopneumothorax, mood disorder, adjustment disorder, C3 cervical fracture, tobacco abuse, who presents with seizure-like activity.  Pt was recently admitted from 10/18-10/19 due to clonidine overdose. She was discharged to behavioral unit. Today pt was noted to have seizure-like activity, therefore transported to ED for further evaluation.  Per report,  her eyes rolled back into her head and she fell to the floor. She injured her head. Patient became ridged and her arms curled up into the fetal position. Pt had spell of drooling, and shaking as well as cyanosis. Urine drug screen on 06/26/2016 was positive for opiates, benzodiazepines and THC. She reportedly was taking Xanax prior to admission, which has been discontinued. When I saw pt in ED, she is calm, oriented 3. Moves all extremities. She states that she has pain all over, but could not give specific information to characterize her pain. She denies cough, chest pain, shortness breath, nausea, vomiting, abdominal pain, symptoms of UTI or diarrhea. She states she used to drink alcohol intermittently, but did not drink alcohol for more than 2 weeks.  ED Course: pt was found to have WBC 12, potassium 3.3, urinalysis negative, temperature 100.3, tachycardia, tachypnea, negative chest x-ray, negative CT head for acute intracranial abnormalities. Patient is admitted to telemetry bed as inpatient. Neurology, Dr. Nicole Kindred was consulted.  Review of Systems:   General: has fevers,  no chills, no changes in body weight, has fatigue and pain all over HEENT: no blurry vision, hearing changes or sore throat. Has scalp hematoma over occipital area . Respiratory: no dyspnea, coughing, wheezing CV: no chest pain, no palpitations GI: no nausea, vomiting, abdominal pain, diarrhea, constipation GU: no dysuria, burning on urination, increased urinary frequency, hematuria  Ext: no leg edema Neuro: no unilateral weakness, numbness, or tingling, no vision change or hearing loss Skin: no rash, no skin tear. MSK: No muscle spasm, no deformity, no limitation of range of movement in spin Heme: No easy bruising.  Travel history: No recent long distant travel.  Allergy:  Allergies  Allergen Reactions  . Penicillins Other (See Comments)    Unknown Has patient had a PCN reaction causing immediate rash, facial/tongue/throat swelling, SOB or lightheadedness with hypotension:  unknown Has patient had a PCN reaction causing severe rash involving mucus membranes or skin necrosis:  unknown Has patient had a PCN reaction that required hospitalization  unknown Has patient had a PCN reaction occurring within the last 10 years: unknown If all of the above answers are "NO", the  . Progestins     Extreme moodiness  . Progestins Other (See Comments)    Extreme moodiness  . Sinequan [Doxepin] Other (See Comments) and Hypertension    Hallucinations, delusions, severe agitation  . Sinequan [Doxepin] Other (See Comments)    hallucinations    Past Medical History:  Diagnosis Date  . AKI (acute kidney injury) ([place]) 01/18/2015  . Allergy   . Anxiety   . Asthma    exacerbated by bronchitisi  . Asthma   . C3 cervical  fracture ([place])   . Cervical transverse process fracture ([place])   . Chronic left shoulder pain 08/2012  . Clavicle fracture   . Concussion 01/29/2016  . Depression   . 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 Dysmenorrhea   . Dysphagia   . Finger fracture, left  10/31/2012   LEFT 4th finger  . Insomnia   . Lumbar transverse process fracture ([place])   . Migraine without aura   . Mood disorder ([place])   . Nephrolithiasis   . PMDD (premenstrual dysphoric disorder)   . Respiratory failure ([place])   . T12 burst fracture ([place])   . Traumatic hemo-pneumothorax   . Traumatic hemopneumothorax 02/09/2016    Past Surgical History:  Procedure Laterality Date  . FRACTURE SURGERY    . POSTERIOR LUMBAR FUSION 4 LEVEL N/A 01/30/2016   Procedure: T9-L2 Posterior Stabilization, Posterior Lumbar Fusion with Pedicle Screws;  Surgeon: Kary Kos, MD;  Location: [facility];  Service: Neurosurgery;  Laterality: N/A;    Social History:  reports that she has been smoking Cigarettes.  She has been smoking about 1.00 pack per day. She has never used smokeless tobacco. She reports that she drinks alcohol. She reports that she does not use drugs.  Family History:  Family History  Problem Relation Age of Onset  . Migraines Mother   . Mental illness Mother   . Heart disease Father   . Alcohol abuse Father   . Arthritis Maternal Grandmother   . Thyroid disease Maternal Grandmother   . Heart disease Maternal Grandfather     AMI 1996, 2014  . Anemia Maternal Grandfather     bone marrow dysfunction     Prior to Admission medications   Medication Sig Start Date End Date Taking? Authorizing Provider  albuterol (PROVENTIL HFA;VENTOLIN HFA) 108 (90 Base) MCG/ACT inhaler Inhale 2 puffs into the lungs every 4 (four) hours as needed for wheezing or shortness of breath (cough, shortness of breath or wheezing.). 10/24/15   [name] Jeffery, PA-C  amphetamine-dextroamphetamine (ADDERALL) 20 MG tablet Take 20 mg by mouth 3 (three) times daily.    Historical Provider, MD  cyclobenzaprine (FLEXERIL) 10 MG tablet Take 1 tablet (10 mg total) by mouth 3 (three) times daily as needed for muscle spasms. 03/02/16   [name] Jeffery, PA-C  levothyroxine (SYNTHROID, LEVOTHROID) 50 MCG tablet Take 1  tablet (50 mcg total) by mouth daily Patient taking differently: Take 25-50 mcg by mouth daily before breakfast. Take 1 tablet (50 mcg total) by mouth daily 09/07/15   [name] Jeffery, PA-C  lidocaine (LIDODERM) 5 % Place 2 patches onto the skin daily. Remove and discard patch within 12 hours. 03/14/16   [name] Jeffery, PA-C  mometasone (NASONEX) 50 MCG/ACT nasal spray Place 2 sprays into the nose daily. 12/07/15   [name] Jeffery, PA-C  naproxen (NAPROSYN) 500 MG tablet Take 1 tablet (500 mg total) by mouth 2 (two) times daily with a meal. 03/02/16   [name] Jeffery, PA-C  ondansetron (ZOFRAN) 8 MG tablet Take 8 mg by mouth daily as needed for nausea or vomiting. Reported on 03/26/2016    Historical Provider, MD  pregabalin (LYRICA) 200 MG capsule Take 1 capsule (200 mg total) by mouth 3 (three) times daily. 01/18/16   Harrison Mons, PA-C    Physical Exam: Vitals:   06/30/16 0100  BP: 118/74  Pulse: 90  Resp: 18  Temp: 98.7 F (37.1 C)  TempSrc: Oral  SpO2: 98%   General: Not in acute distress HEENT:  Has scalp hematoma over occipital area .       Eyes: PERRL, EOMI, no scleral icterus.       ENT: No discharge from the ears and nose, no pharynx injection, no tonsillar enlargement.        Neck: No JVD, no bruit, no mass felt. Heme: No neck lymph node enlargement. Cardiac: S1/S2, RRR, No murmurs, No gallops or rubs. Respiratory: Good air movement bilaterally. No rales, wheezing, rhonchi or rubs. GI: Soft, nondistended, nontender, no rebound pain, no organomegaly, BS present. GU: No hematuria Ext: No pitting leg edema bilaterally. 2+DP/PT pulse bilaterally. Musculoskeletal: No joint deformities, No joint redness or warmth, no limitation of ROM in spin. Skin: No rashes.  Neuro: Alert, oriented X3, cranial nerves II-XII grossly intact, moves all extremities normally. Muscle strength 5/5 in all extremities, sensation to light touch intact. Brachial reflex 2+ bilaterally.Negative Babinski's sign.  Normal finger to nose test. Neck is supple. Psych: Patient is not psychotic, no suicidal or hemocidal ideation.  Labs on Admission: I have personally reviewed following labs and imaging studies  CBC:  Recent Labs Lab 06/26/16 0503 06/27/16 0410 06/29/16 2016 06/29/16 2333  WBC 10.7* 11.9*  --  12.0*  NEUTROABS 6.7  --   --  7.9*  HGB 14.6 13.6 13.6 13.7  HCT 42.5 40.9 40.0 40.8  MCV 86.7 89.3  --  88.3  PLT 277 216  --  123456   Basic Metabolic Panel:  Recent Labs Lab 06/26/16 0503 06/26/16 1609 06/27/16 0410 06/29/16 2016 06/29/16 2333  NA 139  --  138 140 141  K 3.1* 3.9 3.9 4.2 3.3*  CL 109  --  110 106 107  CO2 20*  --  19*  --  23  GLUCOSE 197*  --  104* 88 98  BUN 10  --  8 8 6   CREATININE 0.64  --  0.60 0.60 0.77  CALCIUM 8.8*  --  8.4*  --  9.5  MG  --  1.8  --   --   --    GFR: Estimated Creatinine Clearance: 102.6 mL/min (by C-G formula based on SCr of 0.77 mg/dL). Liver Function Tests:  Recent Labs Lab 06/26/16 0503 06/29/16 2333  AST 15 25  ALT 14 10*  ALKPHOS 108 95  BILITOT 0.5 0.6  PROT 7.2 6.7  ALBUMIN 4.0 4.1   No results for input(s): LIPASE, AMYLASE in the last 168 hours. No results for input(s): AMMONIA in the last 168 hours. Coagulation Profile: No results for input(s): INR, PROTIME in the last 168 hours. Cardiac Enzymes:  Recent Labs Lab 06/26/16 0509  TROPONINI <0.03   BNP (last 3 results) No results for input(s): PROBNP in the last 8760 hours. HbA1C: No results for input(s): HGBA1C in the last 72 hours. CBG:  Recent Labs Lab 06/26/16 0456 06/26/16 1552  GLUCAP 210* 140*   Lipid Profile: No results for input(s): CHOL, HDL, LDLCALC, TRIG, CHOLHDL, LDLDIRECT in the last 72 hours. Thyroid Function Tests: No results for input(s): TSH, T4TOTAL, FREET4, T3FREE, THYROIDAB in the last 72 hours. Anemia Panel: No results for input(s): VITAMINB12, FOLATE, FERRITIN, TIBC, IRON, RETICCTPCT in the last 72 hours. Urine  analysis:    Component Value Date/Time   COLORURINE STRAW (A) 06/29/2016 2319   APPEARANCEUR CLEAR 06/29/2016 2319   LABSPEC 1.010 06/29/2016 2319   PHURINE 7.0 06/29/2016 2319   GLUCOSEU NEGATIVE 06/29/2016 2319   HGBUR LARGE (A) 06/29/2016 2319   BILIRUBINUR NEGATIVE 06/29/2016 2319   BILIRUBINUR  negative 07/16/2015 1325   BILIRUBINUR small 08/18/2013 0841   KETONESUR NEGATIVE 06/29/2016 2319   PROTEINUR NEGATIVE 06/29/2016 2319   UROBILINOGEN 0.2 07/16/2015 1325   UROBILINOGEN 0.2 01/18/2015 0215   NITRITE NEGATIVE 06/29/2016 2319   LEUKOCYTESUR NEGATIVE 06/29/2016 2319   Sepsis Labs: @LABRCNTIP (procalcitonin:4,lacticidven:4) ) Recent Results (from the past 240 hour(s))  MRSA PCR Screening     Status: None   Collection Time: 06/26/16  5:23 PM  Result Value Ref Range Status   MRSA by PCR NEGATIVE NEGATIVE Final    Comment:        The GeneXpert MRSA Assay (FDA approved for NASAL specimens only), is one component of a comprehensive MRSA colonization surveillance program. It is not intended to diagnose MRSA infection nor to guide or monitor treatment for MRSA infections.      Radiological Exams on Admission: Dg Chest 2 View  Result Date: 06/30/2016 CLINICAL DATA:  35 year old female with fever and seizure. EXAM: CHEST  2 VIEW COMPARISON:  None. FINDINGS: The lungs are clear. There is no pleural effusion or pneumothorax. The cardiac silhouette is within normal limits. Multiple old healed right posterior rib fractures. There is age indeterminate fracture of the right lateral sixth rib. There is also fracture of the lateral aspect of the more inferior ribs, possibly the eighth rib. Soft tissue prominence of the right lateral chest wall may represent a small hematoma. The bones are osteopenic for the patient's age. Thoracolumbar posterior fixation hardware. There is age indeterminate compression deformity of a lower thoracic vertebra seen on the lateral projection. IMPRESSION:  No acute cardiopulmonary process. Multiple age indeterminate right lateral rib fractures as described. Correlation with clinical exam and point tenderness recommended. Age indeterminate compression deformity of a lower thoracic vertebra. Electronically Signed   By: Anner Crete M.D.   On: 06/30/2016 00:33   Ct Head Wo Contrast  Result Date: 06/29/2016 CLINICAL DATA:  Fall. EXAM: CT HEAD WITHOUT CONTRAST TECHNIQUE: Contiguous axial images were obtained from the base of the skull through the vertex without intravenous contrast. COMPARISON:  None. FINDINGS: Brain: Ventricles, cisterns and other CSF spaces are normal. There is no mass, mass effect, shift of midline structures or acute hemorrhage. No evidence of acute infarction. Vascular: Within normal. Skull: Within normal. Sinuses/Orbits: Within normal. Other: Small scalp contusion over the left posterior parietal region IMPRESSION: No acute intracranial findings. Small left posterior parietal scalp contusion. Electronically Signed   By: Marin Olp M.D.   On: 06/29/2016 21:11     EKG: Independently reviewed. Sinus rhythm, QTC 453, anteroseptal infarction pattern.   Assessment/Plan Principal Problem:   Seizure-like activity (HCC) Active Problems:   Mood disorder (HCC)   Hypothyroid   Hypokalemia   Tobacco abuse   Adjustment disorder with mixed anxiety and depressed mood   MDD (major depressive disorder), recurrent severe, without psychosis ([place])   Seizure-like activity: Etiology is not clear. Patient has mild fever and leukocytosis, but neck supple, and likely to have meningitis. Neurology was consulted. Per Dr. 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  -will admit to tele bed as inpt. -Seizure precaution -Highly appreciate Dr. Riley Lam consultation commode and follow-up recommendations as follows: 1. Overnight observation with telemetry recommended 2. Trial of Benadryl 50 mg, as movement  disorder may be manifestations of dedication withdrawal or extrapyramidal medication side effects 3. Obtain stat EEG to rule out intermittent focal seizure activity; if abnormal we'll continue with long-term EEG and video monitoring, and initiating anticonvulsant treatment. 4. Patient  to return to behavioral unit when medically stable.  Second issue-Mood disorder, Adjustment disorder with mixed anxiety and depressed mood and major depressive disorder: Denies suicidal homicidal ideations currently, but just had clonidine overdose recently. -continue Adderall, trazodone -suicide precaution -sitter at bed sidde  Hypothyroidism: Last TSH was 1.045 on 06/26/16 -Continue home Synthroid  Tobacco abuse: -Did counseling about importance of quitting smoking -Nicotine patch  Hypokalemia: K= 3.3  on admission. - Repleted  Fever and leukocytosis: No source of infection identified. Urinalysis negative. Chest x-ray is negative. Likely due to seizure and stress-induced demargination. -Follow-up by CBC   DVT ppx: SCD Code Status: Full code Family Communication: None at bed side. Disposition Plan:  Anticipate discharge back to previous home environment Consults called:  Neurology, Dr. Brooks Sailors Admission status: Inpatient/tele      Date of Service 06/30/2016    Ivor Costa [facility] Pager [phone]  If 7PM-7AM, please contact night-coverage [email or web address] Password [email or web address] 06/30/2016, 1:37 AM

## 2016-06-30 NOTE — Procedures (Signed)
ELECTROENCEPHALOGRAM REPORT  Patient: Barbara Rogers       Room #: 5C12 EEG No. ID: [id number] Age: 35 y.o.        Sex: female Referring Physician: Jearld Shines, A Report Date:  06/30/2016        Interpreting Physician: Anthony Sar  History: Barbara Rogers is an 35 y.o. female admitted to behavioral health following an overdose of clonidine suicidal attempt, referred for medical evaluation for new onset seizure-like activity.  Indications for study:  Rule out new onset seizure disorder.  Technique: This is an 18 channel routine scalp EEG performed at the bedside with bipolar and monopolar montages arranged in accordance to the international 10/20 system of electrode placement.   Description: This EEG recording was performed during wakefulness. Predominant activity consisted of 9-10  Hz alpha rhythm with good attenuation with eye opening. Photic stimulation was not performed.  Hyperventilation was not performed.  No epileptiform discharges were recorded. There was no abnormal slowing of cerebral activity.  Interpretation: This is a normal EEG recording during wakefulness. No evidence of an epileptic disorder was demonstrated. However, a normal EEG recording in and of itself does not rule out seizure disorder.   Rush Farmer M.D. [facility] [phone]

## 2016-06-30 NOTE — Tx Team (Signed)
Initial Treatment Plan 06/30/2016 4:59 PM Barbara Rogers [id number]    PATIENT STRESSORS: Other: ready for discharge   PATIENT STRENGTHS: Communication skills General fund of knowledge   PATIENT IDENTIFIED PROBLEMS: "i am ready to get discharged"    "i am not supposed to be here"                 DISCHARGE CRITERIA:  Improved stabilization in mood, thinking, and/or behavior  PRELIMINARY DISCHARGE PLAN: Return to previous living arrangement  PATIENT/FAMILY INVOLVEMENT: This treatment plan has been presented to and reviewed with the patient, Barbara Rogers, and/or family member.  The patient and family have been given the opportunity to ask questions and make suggestions.  Ventura Sellers, RN 06/30/2016, 4:59 PM

## 2016-06-30 NOTE — Progress Notes (Signed)
1:1  Note Pt up and present on unit, no acute distress noted.  Pt is requesting discharge tomorrow, wants to get pain medications straightened out.  Pt is attending evening AA group.  R.  1:1 continued as ordered for Pt's safety.  R.  Pt remains safe on unit. Will continue to monitor.

## 2016-06-30 NOTE — Progress Notes (Signed)
Patient did attend the evening speaker AA meeting.  

## 2016-06-30 NOTE — Progress Notes (Signed)
Otila Kluver, [facility] has confirmed that pt can return there today.  Pt will go to [id number] [facility] ([phone].)  Levada Dy, RN is aware and will call report/transportation, as appropriate.  Creta Levin, LCSW Weekend Coverage [phone]

## 2016-07-01 DIAGNOSIS — Z79899 Other long term (current) drug therapy: Secondary | ICD-10-CM

## 2016-07-01 DIAGNOSIS — F1920 Other psychoactive substance dependence, uncomplicated: Secondary | ICD-10-CM | POA: Diagnosis present

## 2016-07-01 DIAGNOSIS — Z8261 Family history of arthritis: Secondary | ICD-10-CM

## 2016-07-01 DIAGNOSIS — Z811 Family history of alcohol abuse and dependence: Secondary | ICD-10-CM

## 2016-07-01 DIAGNOSIS — F17210 Nicotine dependence, cigarettes, uncomplicated: Secondary | ICD-10-CM

## 2016-07-01 DIAGNOSIS — Z8349 Family history of other endocrine, nutritional and metabolic diseases: Secondary | ICD-10-CM

## 2016-07-01 DIAGNOSIS — Z8489 Family history of other specified conditions: Secondary | ICD-10-CM

## 2016-07-01 DIAGNOSIS — Z8249 Family history of ischemic heart disease and other diseases of the circulatory system: Secondary | ICD-10-CM

## 2016-07-01 LAB — URINE CULTURE

## 2016-07-01 MED ORDER — TRAZODONE HCL 50 MG PO TABS: 50.0000 mg | ORAL_TABLET | Freq: Every evening | ORAL | 0 refills | Status: DC | PRN

## 2016-07-01 MED ORDER — ZOLPIDEM TARTRATE 5 MG PO TABS
5.0000 mg | ORAL_TABLET | Freq: Once | ORAL | Status: AC
  Administered 2016-07-01: 5 mg via ORAL
  Filled 2016-07-01: qty 1

## 2016-07-01 MED ORDER — NICOTINE 21 MG/24HR TD PT24: 21.0000 mg | MEDICATED_PATCH | Freq: Every day | TRANSDERMAL | 0 refills | Status: DC

## 2016-07-01 MED ORDER — LIDOCAINE 5 % EX PTCH
3.0000 | MEDICATED_PATCH | CUTANEOUS | Status: DC
  Administered 2016-07-01: 3 via TRANSDERMAL
  Filled 2016-07-01 (×4): qty 3

## 2016-07-01 NOTE — Progress Notes (Signed)
Medication change note and discontinue one-to-one  Patient apparently had some unsteadiness and spells over the weekend. She was placed on a one to one for monitoring for physical safety. Today she denies any unsteadiness and is able to ambulate well on command. Patient appears to parents prescribed no medications including Librium and Adderall over the weekend.   Assessment/plan patient with polysubstance use disorder seeking prescription medications. We will discontinue any Librium or Adderall orders at present patient denies any suicidal or homicidal ideation, plan or intent denies any unsteadiness or spells currently. Plan is to discharge placement as planned today to outpatient follow-up.

## 2016-07-01 NOTE — Progress Notes (Signed)
[facility] 1:1 Observation Documentation  For the first (8) hours following discontinuation of 1:1 precautions, a progress note entry by nursing staff should be documented at least every 2 hours, reflecting the patient's behavior, condition, mood, and conversation.  Use the progress notes for additional entries.  Time 1:1 discontinued:  10:30AM  Patient's Behavior:  Patient is calm and pleasant.  Patient observed socializing in the dayroom with peers.  Patient's Condition: Patient ambulatory on the unit with steady gait.   Patient's Conversation:  Patient interacting with peers.   Mart Piggs 07/01/2016, 2:05 PM

## 2016-07-01 NOTE — Progress Notes (Signed)
Incidental note update on discharge  Nursing reports that patient although she has told me she wants to leave may actually be ambivalent about discharge. It appears that over the weekend she may have had for whatever reason some spells related to not wanting to go. At present we will continue to monitor her but we would not be surprised if we see some spell behavior later on today as patient states she can't get a ride until about 3:00 in the afternoon. If patient has spell it would not be a contraindication to release however.  Assessment and plan possible ambivalence about discharge which may express itself as some physical spells. Physical spells would not be a contraindication to discharge at this time.

## 2016-07-01 NOTE — Progress Notes (Signed)
  [facility] Adult Case Management Discharge Plan :  Will you be returning to the same living situation after discharge:  Yes,  home At discharge, do you have transportation home?: Yes,  family Do you have the ability to pay for your medications: Yes,  no concerns expressed  Release of information consent forms completed and in the chart;  Patient's signature needed at discharge.  Patient to Follow up at: Follow-up Information    Barbara Nip, MD Follow up on 07/03/2016.   Specialty:  Psychiatry Why:  Appt on this date at 11:30AM with Dr. Toy Care for hospital follow-up/medication management.  Contact information: [name] [place] [address] [phone]           Next level of care provider has access to [place] and Suicide Prevention discussed: Yes,  per CSW Smart progress note, SPE completed  Have you used any form of tobacco in the last 30 days? (Cigarettes, Smokeless Tobacco, Cigars, and/or Pipes): Patient Refused Screening  Has patient been referred to the Quitline?: Patient refused referral  Patient has been referred for addiction treatment: Yes  Barbara Rogers 07/01/2016, 5:31 PM

## 2016-07-01 NOTE — Progress Notes (Signed)
1:1  Pt requested to take bath, did so with assist.  Pt upset about sleep medications not being ordered, refused Trazodone.  Pt in no acute distress.  A.  1:1 continued for Pt safety, spoke with NPs about sleep medications but due to Pt's prior seizure episode and dilated pupils, no sedating medication was advised.  Explained reasons for this to Pt.  R.  Pt remains unhappy, wanted Ambien, but is cooperative.  Will continue to monitor 1:1 for safety.

## 2016-07-01 NOTE — Progress Notes (Signed)
Recreation Therapy Notes  Date: 07/01/16 Time: 0930 Location: 300 [address] Dayroom  Group Topic: Stress Management  Goal Area(s) Addresses:  Patient will verbalize importance of using healthy stress management.  Patient will identify positive emotions associated with healthy stress management.   Behavioral Response: Engaged  Intervention: Stress Management  Activity :  Floating on a Cloud.  LRT introduced the stress management technique of guided imagery.  LRT read a script which allowed patients to participate in the activity.  Patients were to follow along as LRT read script.  Education:  Stress Management, Discharge Planning.   Education Outcome: Acknowledges edcuation/In group clarification offered/Needs additional education  Clinical Observations/Feedback: Pt attended group.    Victorino Sparrow, LRT/CTRS         Victorino Sparrow A 07/01/2016 11:41 AM

## 2016-07-01 NOTE — [facility] Group Notes (Signed)
Pt attended spiritual care group on grief and loss facilitated by chaplain Jobeth Pangilinan   Group opened with brief discussion and psycho-social ed around grief and loss in relationships and in relation to self - identifying life patterns, circumstances, changes that cause losses. Established group norm of speaking from own life experience. Group goal of establishing open and affirming space for members to share loss and experience with grief, normalize grief experience and provide psycho social education and grief support.     

## 2016-07-01 NOTE — [facility] Counselor (Signed)
Late entry for CSW Smart.  Less than 72 hour admit, no PSA.  Edwyna Shell, LCSW Lead Clinical Social Worker Phone:  [phone]

## 2016-07-01 NOTE — BHH Suicide Risk Assessment (Signed)
[facility] Discharge Suicide Risk Assessment   Principal Problem: Polysubstance (excluding opioids) dependence, daily use [facility]) Discharge Diagnoses:  Patient Active Problem List   Diagnosis Date Noted  . Polysubstance (excluding opioids) dependence, daily use ([place]) [F19.20] 07/01/2016  . MDD (major depressive disorder), recurrent episode, severe ([place]) [F33.2] 06/30/2016  . Seizure-like activity ([place]) [R56.9] 06/29/2016  . MDD (major depressive disorder), recurrent severe, without psychosis ([place]) [F33.2] 06/27/2016  . OD (overdose of drug), intentional self-harm, initial encounter ([place]) [T50.902A] 06/26/2016  . Suicidal ideation [R45.851] 06/26/2016  . Clonidine overdose [T46.5X1A] 06/26/2016  . Bradycardia [R00.1] 06/26/2016  . Left scapula fracture [S42.102A] 02/09/2016  . Tobacco abuse [Z72.0]   . Adjustment disorder with mixed anxiety and depressed mood [F43.23]   . Dysphagia [R13.10]   . T12 burst fracture ([place]) [id number] 01/29/2016  . C3 cervical fracture ([place]) [S12.200A] 01/29/2016  . Fracture of left clavicle [S42.002A] 01/29/2016  . Multiple fractures of ribs of both sides [S22.43XA] 01/29/2016  . Cervical transverse process fracture (HCC) [S12.9XXA] 01/29/2016  . Lumbar transverse process fracture ([place]) [S32.009A] 01/29/2016  . MDD (major depressive disorder), recurrent, severe, with psychosis ([place]) [F33.3] 01/19/2015  . Hypokalemia [E87.6] 01/18/2015  . Migraine without aura [G43.009]   . IUD (intrauterine device) in place [Z97.5] 06/02/2013  . Hypothyroid [E03.9] 12/09/2012  . Chronic left shoulder pain [M25.512, G89.29]   . Mood disorder ([place]) [F39] 03/25/2012  . AR (allergic rhinitis) [J30.9] 03/25/2012  . Dysmenorrhea [N94.6] 03/25/2012  . PMDD (premenstrual dysphoric disorder) [F32.81] 03/25/2012  . Chronic insomnia [F51.04] 03/25/2012    Total Time spent with patient: 15 minutes  Musculoskeletal: Strength & Muscle Tone: within normal limits Gait & Station:  normal Patient leans: N/A  Psychiatric Specialty Exam: ROS  Blood pressure (!) 133/91, pulse 95, temperature 98.9 F (37.2 C), temperature source Oral, resp. rate 18, height 5\' 9"  (1.753 m), weight 77.1 kg (170 lb), last menstrual period 06/26/2016.Body mass index is 25.1 kg/m.  General Appearance: Casual  Eye Contact::  Good  Speech:  Clear and Coherent409  Volume:  Normal  Mood:  Euthymic  Affect:  Congruent  Thought Process:  Goal Directed  Orientation:  Negative  Thought Content:  Negative  Suicidal Thoughts:  No  Homicidal Thoughts:  No  Memory:  Negative  Judgement:  Impaired  Insight:  Shallow  Psychomotor Activity:  Normal  Concentration:  Good  Recall:  Good  Fund of Knowledge:Good  Language: Good  Akathisia:  No  Handed:  Right  AIMS (if indicated):     Assets:  Resilience  Sleep:  Number of Hours: 2.75  Cognition: WNL  ADL's:  Intact   Mental Status Per Nursing Assessment::   On Admission:  NA  Demographic Factors:  Caucasian  Loss Factors: Decline in physical health  Historical Factors: NA  Risk Reduction Factors:   NA  Continued Clinical Symptoms:  Alcohol/Substance Abuse/Dependencies Chronic Pain  Cognitive Features That Contribute To Risk:  None    Suicide Risk:  Mild:  Suicidal ideation of limited frequency, intensity, duration, and specificity.  There are no identifiable plans, no associated intent, mild dysphoria and related symptoms, good self-control (both objective and subjective assessment), few other risk factors, and identifiable protective factors, including available and accessible social support.    Plan Of Care/Follow-up recommendations:  Other:  Pt denies current suicidal or homicidal ideation, plan or intent.  Linard Millers, MD 07/01/2016, 8:27 AM

## 2016-07-01 NOTE — Progress Notes (Signed)
1:1 NRS note:  D.  Pt requested lidocaine patches and ambien due to inability to sleep and continued pain.  Pt states she was given this when here previously and it helped.  Pt also had several episodes of being unable to speak and express herself.  When this would happen Pt would stare at RN wide eyed and intense but unable to get out what she knew she wanted to say.  Pt was asked if she wanted to write out her request but Pt was unable to do so.  This would come and go without warning . Pt also stated that she hallucinates at night often and often can't sleep.  A. NP notified and order received for requested medications.  Support offered to Pt.  1:1 continued for Pt's safety.  R.  Pt remains safe on the unit, sleeping now with even and unlabored respirations.  No acute distress noted.  Will continue to monitor.

## 2016-07-01 NOTE — Progress Notes (Signed)
DAR NOTE: Order for constant observation discontinued.  Patient is alert and oriented x 4.  Gait is steady.  No behavioral issue noted.  Patient in the dayroom socializing with peers.  No complaint offered.

## 2016-07-01 NOTE — Progress Notes (Signed)
D: Pt in the dayroom this morning playing cards and interacting with peers. Pt reports that she is feeling a little better this morning. She is smiling and making complete sentences.  A:Offered support and 1:1 observation. R:Safety maintained on the unit.

## 2016-07-01 NOTE — Plan of Care (Signed)
Problem: Activity: Goal: Interest or engagement in activities will improve Outcome: Progressing Pt did attend evening AA group   

## 2016-07-01 NOTE — Discharge Summary (Signed)
Physician Discharge Summary Note  Patient:  Barbara Rogers is an 35 y.o., female MRN:  [id number] DOB:  [birth year] Patient phone:  [phone] (home)  Patient address:   [address] [place] [address],  Total Time spent with patient: 30 minutes  Date of Admission:  06/30/2016 Date of Discharge: 07/01/2016  Reason for Admission: PER HPI from MD consult: Patient is a 35 y.o.female who is a poor historian. However, she reports history of Anxiety, depression,  chronic pain and hypothyroidism. Patient was transferred to Neuro unit from [facility] yesterday due to seizure activities. 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 However, patient reports that she attempted suicide on 06/25/16 prior to her admission to [facility]. She reports that she overdosed on 20 tablets of Trazodone, however, charts review indicates that patient overdosed on approximately 120 tablets of clonidine on 06/24/16.  She is unclear with her dates but she made it clear that she did overdosed in an attempt to die.  She reports her current stressor as Chronic ovarian pain and   worsening depression since she lost her grandfather earlier this year. She reports being lonely, feels hopeless and often times feels miserable due to her chronic pain. Patient is unable to contract for safety but she denies delusions or psychosis.  Principal Problem: Polysubstance (excluding opioids) dependence, daily use [facility]) Discharge Diagnoses: Patient Active Problem List   Diagnosis Date Noted  . Polysubstance (excluding opioids) dependence, daily use ([place]) [F19.20] 07/01/2016  . MDD (major depressive disorder), recurrent episode, severe ([place]) [F33.2] 06/30/2016  . Seizure-like activity ([place]) [R56.9] 06/29/2016  . MDD (major depressive disorder), recurrent severe, without psychosis ([place]) [F33.2] 06/27/2016  . OD (overdose of drug), intentional self-harm, initial encounter ([place]) [T50.902A]  06/26/2016  . Suicidal ideation [R45.851] 06/26/2016  . Clonidine overdose [T46.5X1A] 06/26/2016  . Bradycardia [R00.1] 06/26/2016  . Left scapula fracture [S42.102A] 02/09/2016  . Tobacco abuse [Z72.0]   . Adjustment disorder with mixed anxiety and depressed mood [F43.23]   . Dysphagia [R13.10]   . T12 burst fracture ([place]) [id number] 01/29/2016  . C3 cervical fracture ([place]) [S12.200A] 01/29/2016  . Fracture of left clavicle [S42.002A] 01/29/2016  . Multiple fractures of ribs of both sides [S22.43XA] 01/29/2016  . Cervical transverse process fracture (HCC) [S12.9XXA] 01/29/2016  . Lumbar transverse process fracture ([place]) [S32.009A] 01/29/2016  . MDD (major depressive disorder), recurrent, severe, with psychosis ([place]) [F33.3] 01/19/2015  . Hypokalemia [E87.6] 01/18/2015  . Migraine without aura [G43.009]   . IUD (intrauterine device) in place [Z97.5] 06/02/2013  . Hypothyroid [E03.9] 12/09/2012  . Chronic left shoulder pain [M25.512, G89.29]   . Mood disorder ([place]) [F39] 03/25/2012  . AR (allergic rhinitis) [J30.9] 03/25/2012  . Dysmenorrhea [N94.6] 03/25/2012  . PMDD (premenstrual dysphoric disorder) [F32.81] 03/25/2012  . Chronic insomnia [F51.04] 03/25/2012    Past Psychiatric History:  Past Medical History:  Past Medical History:  Diagnosis Date  . AKI (acute kidney injury) ([place]) 01/18/2015  . Allergy   . Anxiety   . Asthma    exacerbated by bronchitisi  . Asthma   . C3 cervical fracture ([place])   . Cervical transverse process fracture ([place])   . Chronic left shoulder pain 08/2012  . Clavicle fracture   . Concussion 01/29/2016  . Depression   . 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 Dysmenorrhea   . Dysphagia   . Finger fracture, left 10/31/2012   LEFT 4th finger  . Insomnia   .  Lumbar transverse process fracture ([place])   . Migraine without aura   . Mood disorder ([place])   . Nephrolithiasis   . PMDD (premenstrual dysphoric disorder)   . Respiratory  failure ([place])   . T12 burst fracture ([place])   . Traumatic hemo-pneumothorax   . Traumatic hemopneumothorax 02/09/2016    Past Surgical History:  Procedure Laterality Date  . FRACTURE SURGERY    . POSTERIOR LUMBAR FUSION 4 LEVEL N/A 01/30/2016   Procedure: T9-L2 Posterior Stabilization, Posterior Lumbar Fusion with Pedicle Screws;  Surgeon: Kary Kos, MD;  Location: [facility];  Service: Neurosurgery;  Laterality: N/A;   Family History:  Family History  Problem Relation Age of Onset  . Migraines Mother   . Mental illness Mother   . Heart disease Father   . Alcohol abuse Father   . Arthritis Maternal Grandmother   . Thyroid disease Maternal Grandmother   . Heart disease Maternal Grandfather     AMI 1996, 2014  . Anemia Maternal Grandfather     bone marrow dysfunction   Family Psychiatric  History:  Social History:  History  Alcohol Use  . 0.0 oz/week    Comment: occasionally     History  Drug Use No    Social History   Social History  . Marital status: Unknown    Spouse name: N/A  . Number of children: 0  . Years of education: 12   Occupational History  . [employer]  .      [employer]   Social History Main Topics  . Smoking status: Current Every Day Smoker    Packs/day: 1.00    Types: Cigarettes  . Smokeless tobacco: Never Used  . Alcohol use 0.0 oz/week     Comment: occasionally  . Drug use: No  . Sexual activity: Yes    Partners: Male    Birth control/ protection: IUD   Other Topics Concern  . None   Social History Narrative   ** Merged History Encounter **       Lives with her boyfriend, Barbara Rogers.    Hospital Course:  Barbara Rogers was admitted for Polysubstance (excluding opioids) dependence, daily use ([place])  and crisis management.  Pt was treated discharged with the medications listed below under Medication List.  Medical problems were identified and treated as needed.  Home medications were restarted as appropriate.  Improvement was monitored by  observation and Barbara Rogers 's daily report of symptom reduction.  Emotional and mental status was monitored by daily self-inventory reports completed by Barbara Rogers and clinical staff.         Nicky Raulston was evaluated by the treatment team for stability and plans for continued recovery upon discharge. Amanat Bednarik 's motivation was an integral factor for scheduling further treatment. Employment, transportation, bed availability, health status, family support, and any pending legal issues were also considered during hospital stay. Pt was offered further treatment options upon discharge including but not limited to Residential, Intensive Outpatient, and Outpatient treatment.  Vanelope Euceda will follow up with the services as listed below under Follow Up Information.     Upon completion of this admission the patient was both mentally and medically stable for discharge denying suicidal/homicidal ideation, auditory/visual/tactile hallucinations, delusional thoughts and paranoia.    Barbara Rogers responded well to treatment with Trazone and individual and group session without adverse effects. Pt demonstrated improvement without reported or observed adverse effects to the point of stability appropriate  for outpatient management. Pertinent labs include: CBC, BMP which outpatient follow-up is necessary for lab recheck as mentioned below. Reviewed CBC, CMP, BAL, and UDS+ Opiates and Benzodiazepines; all unremarkable aside from noted exceptions.   Physical Findings: AIMS: Facial and Oral Movements Muscles of Facial Expression: None, normal Lips and Perioral Area: None, normal Jaw: None, normal Tongue: None, normal,Extremity Movements Upper (arms, wrists, hands, fingers): None, normal Lower (legs, knees, ankles, toes): None, normal, Trunk Movements Neck, shoulders, hips: None, normal, Overall Severity Severity of abnormal movements (highest score from questions above): None,  normal Incapacitation due to abnormal movements: None, normal Patient's awareness of abnormal movements (rate only patient's report): No Awareness, Dental Status Current problems with teeth and/or dentures?: No Does patient usually wear dentures?: No  CIWA:    COWS:     Musculoskeletal: Strength & Muscle Tone: within normal limits Gait & Station: normal Patient leans: N/A  Psychiatric Specialty Exam: See SRA by MD Physical Exam  Nursing note and vitals reviewed. Constitutional: She is oriented to person, place, and time. She appears well-developed.  Neurological: She is alert and oriented to person, place, and time.  Psychiatric: She has a normal mood and affect. Her behavior is normal.    Review of Systems  Psychiatric/Behavioral: Negative for depression (stable) and suicidal ideas. The patient is not nervous/anxious (stable).     Blood pressure 117/82, pulse 63, temperature 97.8 F (36.6 C), temperature source Oral, resp. rate 17, height 5\' 9"  (1.753 m), weight 77.1 kg (170 lb), last menstrual period 06/26/2016.Body mass index is 25.1 kg/m.    Have you used any form of tobacco in the last 30 days? (Cigarettes, Smokeless Tobacco, Cigars, and/or Pipes): Patient Refused Screening  Has this patient used any form of tobacco in the last 30 days? (Cigarettes, Smokeless Tobacco, Cigars, and/or Pipes)  Yes, A prescription for an FDA-approved tobacco cessation medication was offered at discharge and the patient refused  Blood Alcohol level:  Lab Results  Component Value Date   ETH 69 (H) 06/26/2016   ETH <5 [id number]    Metabolic Disorder Labs:  Lab Results  Component Value Date   HGBA1C 4.8 07/16/2013   No results found for: PROLACTIN Lab Results  Component Value Date   TRIG 190 (H) 02/02/2016    See Psychiatric Specialty Exam and Suicide Risk Assessment completed by Attending Physician prior to discharge.  Discharge destination:  Home  Is patient on multiple  antipsychotic therapies at discharge:  No   Has Patient had three or more failed trials of antipsychotic monotherapy by history:  No  Recommended Plan for Multiple Antipsychotic Therapies: NA  Discharge Instructions    Diet - low sodium heart healthy    Complete by:  As directed    Discharge instructions    Complete by:  As directed    Take all medications as prescribed. Keep all follow-up appointments as scheduled.  Do not consume alcohol or use illegal drugs while on prescription medications. Report any adverse effects from your medications to your primary care provider promptly.  In the event of recurrent symptoms or worsening symptoms, call 911, a crisis hotline, or go to the nearest emergency department for evaluation.   Increase activity slowly    Complete by:  As directed        Medication List    STOP taking these medications   amphetamine-dextroamphetamine 20 MG tablet Commonly known as:  ADDERALL   cyclobenzaprine 10 MG tablet Commonly known as:  FLEXERIL   lidocaine  5 % Commonly known as:  LIDODERM   mometasone 50 MCG/ACT nasal spray Commonly known as:  NASONEX     TAKE these medications     Indication  albuterol 108 (90 Base) MCG/ACT inhaler Commonly known as:  PROVENTIL HFA;VENTOLIN HFA Inhale 2 puffs into the lungs every 4 (four) hours as needed for wheezing or shortness of breath (cough, shortness of breath or wheezing.).  Indication:  Asthma   levothyroxine 50 MCG tablet Commonly known as:  SYNTHROID, LEVOTHROID Take 1 tablet (50 mcg total) by mouth daily What changed:  how much to take  how to take this  when to take this  additional instructions  Indication:  Underactive Thyroid   nicotine 21 mg/24hr patch Commonly known as:  NICODERM CQ - dosed in mg/24 hours Place 1 patch (21 mg total) onto the skin daily at 6 (six) AM. Start taking on:  07/02/2016  Indication:  Nicotine Addiction   pregabalin 200 MG capsule Commonly known as:   LYRICA Take 1 capsule (200 mg total) by mouth 3 (three) times daily.  Indication:  Generalized Anxiety Disorder   traZODone 50 MG tablet Commonly known as:  DESYREL Take 1 tablet (50 mg total) by mouth at bedtime as needed for sleep.  Indication:  Trouble Sleeping      Follow-up Information    [name], MD Follow up on 07/03/2016.   Specialty:  Psychiatry Why:  Appt on this date at 11:30AM with Dr. Toy Care for hospital follow-up/medication management.  Contact information: [place] [address]  [address] [phone]           Follow-up recommendations:  Activity:  as tolerated Diet:  heart healthy  Comments:  Take all medications as prescribed. Keep all follow-up appointments as scheduled.  Do not consume alcohol or use illegal drugs while on prescription medications. Report any adverse effects from your medications to your primary care provider promptly.  In the event of recurrent symptoms or worsening symptoms, call 911, a crisis hotline, or go to the nearest emergency department for evaluation.   Signed: Derrill Center, NP 07/01/2016, 10:41 AM

## 2016-07-01 NOTE — Progress Notes (Signed)
Pt was originally admitted on 10/19 and scheduled to discharge on Saturday, 10/21--notes from that admission to [facility] are not showing in the system. Follow-up in place with [facility]. SPE completed with the patient-she did not consent to family contact. Pt scheduled to discharge today 07/01/2016   Maxie Better, MSW, LCSW Clinical Social Worker 07/01/2016 10:53 AM

## 2016-07-01 NOTE — Progress Notes (Signed)
Discharge note:  Patient discharged home per MD order.  Reviewed AVS/transition record with patient and she indicated understanding.  Patient received prescriptions.  Patient denies any thoughts of self harm; AVH/HI.  Patient left ambulatory with a friend.

## 2016-07-02 ENCOUNTER — Telehealth (HOSPITAL_COMMUNITY): Payer: Self-pay

## 2016-07-02 ENCOUNTER — Ambulatory Visit (HOSPITAL_COMMUNITY): Payer: BLUE CROSS/BLUE SHIELD

## 2016-07-02 NOTE — Telephone Encounter (Signed)
No show, called and spoke to pt who stated her back was bothering her and did not make it to apt.  Pt informed 2 no show policy as well as informed next apt date and time with contact info given.    [address] [employer]; CBIS [phone]

## 2016-07-02 NOTE — Progress Notes (Signed)
  [facility] Adult Case Management Discharge Plan :  Will you be returning to the same living situation after discharge:  Yes,  home At discharge, do you have transportation home?: Yes,  friend Do you have the ability to pay for your medications: No.mental health  Release of information consent forms completed and submitted to medical records by CSW.  Patient to Follow up at: Follow-up Information    [name], MD Follow up on 07/03/2016.   Specialty:  Psychiatry Why:  Appt on this date at 11:30AM with Dr. Toy Care for hospital follow-up/medication management.  Contact information: [place] [address] [phone]           Next level of care provider has access to [place] and Suicide Prevention discussed: Yes,  SPE completed with pt; pt declined to consent to family contact.  Have you used any form of tobacco in the last 30 days? (Cigarettes, Smokeless Tobacco, Cigars, and/or Pipes): Patient Refused Screening  Has patient been referred to the Quitline?: Patient refused referral  Patient has been referred for addiction treatment: Yes  Casmir Auguste N Smart LCSW 07/02/2016, 1:50 PM

## 2016-07-04 ENCOUNTER — Ambulatory Visit (HOSPITAL_COMMUNITY): Payer: BLUE CROSS/BLUE SHIELD | Admitting: Physical Therapy

## 2016-07-04 ENCOUNTER — Telehealth (HOSPITAL_COMMUNITY): Payer: Self-pay | Admitting: Physical Therapy

## 2016-07-04 NOTE — Telephone Encounter (Signed)
No Show #2: LMOM reminding pt of missed appt this afternoon. Provided # to office to call if she plans on continuing with PT vs discharge and reminded of her next appt 07/09/16 at 1:45pm.  2:39 PM,07/04/16 Barbara Rogers PT, Lead Outpatient Physical Therapy [phone]

## 2016-07-05 LAB — CULTURE, BLOOD (ROUTINE X 2)
CULTURE: NO GROWTH
CULTURE: NO GROWTH

## 2016-07-08 ENCOUNTER — Emergency Department (HOSPITAL_COMMUNITY): Payer: BLUE CROSS/BLUE SHIELD

## 2016-07-08 ENCOUNTER — Encounter (HOSPITAL_COMMUNITY): Payer: Self-pay | Admitting: Emergency Medicine

## 2016-07-08 ENCOUNTER — Emergency Department (HOSPITAL_COMMUNITY)
Admission: EM | Admit: 2016-07-08 | Discharge: 2016-07-08 | Disposition: A | Discharge status: Home / Self Care | Payer: BLUE CROSS/BLUE SHIELD | Attending: Emergency Medicine | Admitting: Emergency Medicine

## 2016-07-08 DIAGNOSIS — I712 Thoracic aortic aneurysm, without rupture: Secondary | ICD-10-CM | POA: Diagnosis not present

## 2016-07-08 DIAGNOSIS — F17210 Nicotine dependence, cigarettes, uncomplicated: Secondary | ICD-10-CM | POA: Insufficient documentation

## 2016-07-08 DIAGNOSIS — R42 Dizziness and giddiness: Secondary | ICD-10-CM | POA: Insufficient documentation

## 2016-07-08 DIAGNOSIS — J45909 Unspecified asthma, uncomplicated: Secondary | ICD-10-CM | POA: Diagnosis not present

## 2016-07-08 DIAGNOSIS — Z79899 Other long term (current) drug therapy: Secondary | ICD-10-CM | POA: Insufficient documentation

## 2016-07-08 HISTORY — DX: Unspecified convulsions: R56.9

## 2016-07-08 LAB — PREGNANCY, URINE: PREG TEST UR: NEGATIVE

## 2016-07-08 LAB — URINALYSIS, ROUTINE W REFLEX MICROSCOPIC
Glucose, UA: NEGATIVE mg/dL
HGB URINE DIPSTICK: NEGATIVE
KETONES UR: 40 mg/dL — AB
Leukocytes, UA: NEGATIVE
NITRITE: NEGATIVE
PROTEIN: NEGATIVE mg/dL
Specific Gravity, Urine: >1.03 — ABNORMAL HIGH (ref 1.005–1.030)
pH: 5 (ref 5.0–8.0)

## 2016-07-08 LAB — CBC WITH DIFFERENTIAL/PLATELET
BASOS PCT: 0 %
Basophils Absolute: 0 10*3/uL (ref 0.0–0.1)
EOS ABS: 0.1 10*3/uL (ref 0.0–0.7)
Eosinophils Relative: 1 %
HEMATOCRIT: 46.9 % — AB (ref 36.0–46.0)
HEMOGLOBIN: 16.3 g/dL — AB (ref 12.0–15.0)
LYMPHS ABS: 1.9 10*3/uL (ref 0.7–4.0)
Lymphocytes Relative: 26 %
MCH: 31.2 pg (ref 26.0–34.0)
MCHC: 34.8 g/dL (ref 30.0–36.0)
MCV: 89.8 fL (ref 78.0–100.0)
MONO ABS: 0.5 10*3/uL (ref 0.1–1.0)
Monocytes Relative: 7 %
NEUTROS PCT: 66 %
Neutro Abs: 4.7 10*3/uL (ref 1.7–7.7)
Platelets: 267 10*3/uL (ref 150–400)
RBC: 5.22 MIL/uL — ABNORMAL HIGH (ref 3.87–5.11)
RDW: 14.9 % (ref 11.5–15.5)
WBC: 7.2 10*3/uL (ref 4.0–10.5)

## 2016-07-08 LAB — COMPREHENSIVE METABOLIC PANEL
ALBUMIN: 4.9 g/dL (ref 3.5–5.0)
ALK PHOS: 91 U/L (ref 38–126)
ALT: 14 U/L (ref 14–54)
AST: 18 U/L (ref 15–41)
Anion gap: 8 (ref 5–15)
BILIRUBIN TOTAL: 0.5 mg/dL (ref 0.3–1.2)
BUN: 19 mg/dL (ref 6–20)
CALCIUM: 9.7 mg/dL (ref 8.9–10.3)
CO2: 26 mmol/L (ref 22–32)
Chloride: 103 mmol/L (ref 101–111)
Creatinine, Ser: 0.72 mg/dL (ref 0.44–1.00)
GFR calc Af Amer: >60 mL/min (ref >60)
GFR calc non Af Amer: >60 mL/min (ref >60)
GLUCOSE: 103 mg/dL — AB (ref 65–99)
Potassium: 3.9 mmol/L (ref 3.5–5.1)
SODIUM: 137 mmol/L (ref 135–145)
TOTAL PROTEIN: 8.6 g/dL — AB (ref 6.5–8.1)

## 2016-07-08 LAB — TROPONIN I

## 2016-07-08 LAB — ETHANOL: Alcohol, Ethyl (B): 10 mg/dL — ABNORMAL HIGH (ref <5)

## 2016-07-08 LAB — MAGNESIUM: Magnesium: 1.7 mg/dL (ref 1.7–2.4)

## 2016-07-08 LAB — RAPID URINE DRUG SCREEN, HOSP PERFORMED
Amphetamines: NOT DETECTED
Barbiturates: NOT DETECTED
Benzodiazepines: POSITIVE — AB
Cocaine: NOT DETECTED
OPIATES: NOT DETECTED
Tetrahydrocannabinol: POSITIVE — AB

## 2016-07-08 LAB — D-DIMER, QUANTITATIVE (NOT AT ARMC): D DIMER QUANT: 0.51 ug{FEU}/mL — AB (ref 0.00–0.50)

## 2016-07-08 MED ORDER — MAGNESIUM OXIDE 400 (241.3 MG) MG PO TABS
400.0000 mg | ORAL_TABLET | Freq: Once | ORAL | Status: AC
  Administered 2016-07-08: 400 mg via ORAL
  Filled 2016-07-08: qty 1

## 2016-07-08 MED ORDER — SODIUM CHLORIDE 0.9 % IV BOLUS (SEPSIS)
1000.0000 mL | Freq: Once | INTRAVENOUS | Status: AC
  Administered 2016-07-08: 1000 mL via INTRAVENOUS

## 2016-07-08 MED ORDER — MAGNESIUM OXIDE 400 (241.3 MG) MG PO TABS
ORAL_TABLET | ORAL | Status: AC
  Filled 2016-07-08: qty 1

## 2016-07-08 MED ORDER — IOPAMIDOL (ISOVUE-370) INJECTION 76%
100.0000 mL | Freq: Once | INTRAVENOUS | Status: AC | PRN
  Administered 2016-07-08: 100 mL via INTRAVENOUS

## 2016-07-08 NOTE — ED Triage Notes (Signed)
Patient complaining of dizziness x 3 days. States she was treated recently at [facility] and had "seizure like activity" and was sent to [facility] and admitted to hospital.

## 2016-07-08 NOTE — Discharge Instructions (Signed)
Take your usual prescriptions as previously directed.  Call your regular medical doctor tomorrow to schedule a follow up appointment within the week. Call the Cardiologist tomorrow to schedule a follow up appointment within the next week. Return to the Emergency Department immediately sooner if worsening.

## 2016-07-08 NOTE — ED Provider Notes (Signed)
[facility] DEPT Provider Note   CSN: [id number] Arrival date & time: 07/08/16  1702     History   Chief Complaint Chief Complaint  Patient presents with  . Dizziness    lightheadedness    HPI Barbara Rogers is a 35 y.o. female.   Dizziness    Pt was seen at 1725. Per pt, c/o gradual onset and persistence of constant "dizziness" for the past 3 days. Describes her dizziness as "lightheadedness." Denies any other symptoms. Denies SOB/cough, no CP/palpitations, no abd pain, no N/V/D, no fevers, no focal motor weakness, no tingling/numbness in extremities.   Past Medical History:  Diagnosis Date  . AKI (acute kidney injury) ([place]) 01/18/2015  . Allergy   . Anxiety   . Asthma    exacerbated by bronchitisi  . Asthma   . C3 cervical fracture ([place])   . Cervical transverse process fracture ([place])   . Chronic left shoulder pain 08/2012  . Clavicle fracture   . Concussion 01/29/2016  . Depression   . 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 Dysmenorrhea   . Dysphagia   . Finger fracture, left 10/31/2012   LEFT 4th finger  . Insomnia   . Lumbar transverse process fracture ([place])   . Migraine without aura   . Mood disorder ([place])   . Nephrolithiasis   . PMDD (premenstrual dysphoric disorder)   . Respiratory failure ([place])   . Seizure-like activity ([place]) 06/30/2016   normal EEG, question pseudoseizures  . T12 burst fracture ([place])   . Traumatic hemo-pneumothorax   . Traumatic hemopneumothorax 02/09/2016    Patient Active Problem List   Diagnosis Date Noted  . Polysubstance (excluding opioids) dependence, daily use ([place]) 07/01/2016  . MDD (major depressive disorder), recurrent episode, severe ([place]) 06/30/2016  . Seizure-like activity ([place]) 06/29/2016  . MDD (major depressive disorder), recurrent severe, without psychosis ([place]) 06/27/2016  . OD (overdose of drug), intentional self-harm, initial encounter ([place]) 06/26/2016  . Suicidal ideation 06/26/2016  .  Clonidine overdose 06/26/2016  . Bradycardia 06/26/2016  . Left scapula fracture 02/09/2016  . Tobacco abuse   . Adjustment disorder with mixed anxiety and depressed mood   . Dysphagia   . T12 burst fracture ([place]) 01/29/2016  . C3 cervical fracture ([place]) 01/29/2016  . Fracture of left clavicle 01/29/2016  . Multiple fractures of ribs of both sides 01/29/2016  . Cervical transverse process fracture ([place]) 01/29/2016  . Lumbar transverse process fracture ([place]) 01/29/2016  . MDD (major depressive disorder), recurrent, severe, with psychosis ([place]) 01/19/2015  . Hypokalemia 01/18/2015  . Migraine without aura   . IUD (intrauterine device) in place 06/02/2013  . Hypothyroid 12/09/2012  . Chronic left shoulder pain   . Mood disorder ([place]) 03/25/2012  . AR (allergic rhinitis) 03/25/2012  . Dysmenorrhea 03/25/2012  . PMDD (premenstrual dysphoric disorder) 03/25/2012  . Chronic insomnia 03/25/2012    Past Surgical History:  Procedure Laterality Date  . FRACTURE SURGERY    . POSTERIOR LUMBAR FUSION 4 LEVEL N/A 01/30/2016   Procedure: T9-L2 Posterior Stabilization, Posterior Lumbar Fusion with Pedicle Screws;  Surgeon: Kary Kos, MD;  Location: [facility];  Service: Neurosurgery;  Laterality: N/A;    OB History    Gravida Para Term Preterm AB Living   3 0 0 0 3     SAB TAB Ectopic Multiple Live Births   0 3 0           Home Medications    Prior to Admission medications  Medication Sig Start Date End Date Taking? Authorizing Provider  albuterol (PROVENTIL HFA;VENTOLIN HFA) 108 (90 Base) MCG/ACT inhaler Inhale 2 puffs into the lungs every 4 (four) hours as needed for wheezing or shortness of breath (cough, shortness of breath or wheezing.). 10/24/15   [name] Jeffery, PA-C  levothyroxine (SYNTHROID, LEVOTHROID) 50 MCG tablet Take 1 tablet (50 mcg total) by mouth daily Patient taking differently: Take 25 mcg by mouth daily before breakfast. Take 1 tablet (50 mcg total) by mouth daily  09/07/15   [name] Jeffery, PA-C  nicotine (NICODERM CQ - DOSED IN MG/24 HOURS) 21 mg/24hr patch Place 1 patch (21 mg total) onto the skin daily at 6 (six) AM. 07/02/16   Derrill Center, NP  pregabalin (LYRICA) 200 MG capsule Take 1 capsule (200 mg total) by mouth 3 (three) times daily. 01/18/16   [name] Jeffery, PA-C  traZODone (DESYREL) 50 MG tablet Take 1 tablet (50 mg total) by mouth at bedtime as needed for sleep. 07/01/16   Derrill Center, NP    Family History Family History  Problem Relation Age of Onset  . Migraines Mother   . Mental illness Mother   . Heart disease Father   . Alcohol abuse Father   . Arthritis Maternal Grandmother   . Thyroid disease Maternal Grandmother   . Heart disease Maternal Grandfather     AMI 1996, 2014  . Anemia Maternal Grandfather     bone marrow dysfunction    Social History Social History  Substance Use Topics  . Smoking status: Current Every Day Smoker    Packs/day: 1.00    Types: Cigarettes  . Smokeless tobacco: Never Used  . Alcohol use 0.0 oz/week     Comment: occasionally     Allergies   Penicillins; Progestins; Progestins; Sinequan [doxepin]; and Sinequan [doxepin]   Review of Systems Review of Systems  Neurological: Positive for dizziness.  ROS: Statement: All systems negative except as marked or noted in the HPI; Constitutional: Negative for fever and chills. ; ; Eyes: Negative for eye pain, redness and discharge. ; ; ENMT: Negative for ear pain, hoarseness, nasal congestion, sinus pressure and sore throat. ; ; Cardiovascular: Negative for chest pain, palpitations, diaphoresis, dyspnea and peripheral edema. ; ; Respiratory: Negative for cough, wheezing and stridor. ; ; Gastrointestinal: Negative for nausea, vomiting, diarrhea, abdominal pain, blood in stool, hematemesis, jaundice and rectal bleeding. . ; ; Genitourinary: Negative for dysuria, flank pain and hematuria. ; ; Musculoskeletal: Negative for back pain and neck pain.  Negative for swelling and trauma.; ; Skin: Negative for pruritus, rash, abrasions, blisters, bruising and skin lesion.; ; Neuro: +lightheadedness. Negative for headache and neck stiffness. Negative for weakness, altered level of consciousness, altered mental status, extremity weakness, paresthesias, involuntary movement, seizure and syncope.      Physical Exam Updated Vital Signs BP (!) 130/101 (BP Location: Right Arm)   Pulse (!) 121   Temp 97.8 F (36.6 C) (Oral)   Resp 14   Ht 5\' 9"  (1.753 m)   Wt 175 lb (79.4 kg)   LMP 06/26/2016   SpO2 100%   BMI 25.84 kg/m   17:43 Orthostatic Vital Signs CS  Orthostatic Lying   BP- Lying:  120/91  Pulse- Lying: 105      Orthostatic Sitting  BP- Sitting: 130/90  Pulse- Sitting: 116      Orthostatic Standing at 0 minutes  BP- Standing at 0 minutes: 116/82  Pulse- Standing at 0 minutes: 132   Patient Vitals  for the past 24 hrs:  BP Temp Temp src Pulse Resp SpO2 Height Weight  07/08/16 1814 121/93 - - 118 16 99 % - -  07/08/16 1730 133/84 - - 102 - 94 % - -  07/08/16 1707 (!) 130/101 97.8 F (36.6 C) Oral (!) 121 14 100 % 5\' 9"  (1.753 m) 175 lb (79.4 kg)     Physical Exam 1730: Physical examination:  Nursing notes reviewed; Vital signs and O2 SAT reviewed;  Constitutional: Well developed, Well nourished, Well hydrated, In no acute distress; Head:  Normocephalic, atraumatic; Eyes: EOMI, PERRL, No scleral icterus; ENMT: Mouth and pharynx normal, Mucous membranes moist; Neck: Supple, Full range of motion, No lymphadenopathy; Cardiovascular: Tachycardic rate and rhythm, No gallop; Respiratory: Breath sounds clear & equal bilaterally, No wheezes.  Speaking full sentences with ease, Normal respiratory effort/excursion; Chest: Nontender, Movement normal; Abdomen: Soft, Nontender, Nondistended, Normal bowel sounds; Genitourinary: No CVA tenderness; Extremities: Pulses normal, No tenderness, No edema, No calf edema or asymmetry.; Neuro: AA&Ox3,  Major CN grossly intact. No facial droop. Speech clear. No gross focal motor or sensory deficits in extremities. Climbs on and off stretcher easily by herself. Gait steady.; Skin: Color normal, Warm, Dry.; Psych:  Affect flat, poor eye contact.    ED Treatments / Results  Labs (all labs ordered are listed, but only abnormal results are displayed)   EKG  EKG Interpretation  Date/Time:  Monday July 08 2016 17:55:21 EDT Ventricular Rate:  99 PR Interval:    QRS Duration: 92 QT Interval:  386 QTC Calculation: 496 R Axis:   87 Text Interpretation:  Sinus rhythm Prolonged QT interval When compared with ECG of 06/29/2016 QT has lengthened and Rate faster Confirmed by [facility]  MD, Nunzio Cory [phone]) on 07/08/2016 6:02:46 PM       Radiology   Procedures Procedures (including critical care time)  Medications Ordered in ED Medications - No data to display   Initial Impression / Assessment and Plan / ED Course  I have reviewed the triage vital signs and the nursing notes.  Pertinent labs & imaging results that were available during my care of the patient were reviewed by me and considered in my medical decision making (see chart for details).  MDM Reviewed: previous chart, nursing note and vitals Reviewed previous: labs and ECG Interpretation: labs and ECG   Results for orders placed or performed during the hospital encounter of 07/08/16  Urinalysis, Routine w reflex microscopic  Result Value Ref Range   Color, Urine YELLOW YELLOW   APPearance CLEAR CLEAR   Specific Gravity, Urine >1.030 (H) 1.005 - 1.030   pH 5.0 5.0 - 8.0   Glucose, UA NEGATIVE NEGATIVE mg/dL   Hgb urine dipstick NEGATIVE NEGATIVE   Bilirubin Urine SMALL (A) NEGATIVE   Ketones, ur 40 (A) NEGATIVE mg/dL   Protein, ur NEGATIVE NEGATIVE mg/dL   Nitrite NEGATIVE NEGATIVE   Leukocytes, UA NEGATIVE NEGATIVE  Pregnancy, urine  Result Value Ref Range   Preg Test, Ur NEGATIVE NEGATIVE  Urine rapid drug  screen (hosp performed)  Result Value Ref Range   Opiates NONE DETECTED NONE DETECTED   Cocaine NONE DETECTED NONE DETECTED   Benzodiazepines POSITIVE (A) NONE DETECTED   Amphetamines NONE DETECTED NONE DETECTED   Tetrahydrocannabinol POSITIVE (A) NONE DETECTED   Barbiturates NONE DETECTED NONE DETECTED  Comprehensive metabolic panel  Result Value Ref Range   Sodium 137 135 - 145 mmol/L   Potassium 3.9 3.5 - 5.1 mmol/L   Chloride 103  101 - 111 mmol/L   CO2 26 22 - 32 mmol/L   Glucose, Bld 103 (H) 65 - 99 mg/dL   BUN 19 6 - 20 mg/dL   Creatinine, Ser 0.72 0.44 - 1.00 mg/dL   Calcium 9.7 8.9 - 10.3 mg/dL   Total Protein 8.6 (H) 6.5 - 8.1 g/dL   Albumin 4.9 3.5 - 5.0 g/dL   AST 18 15 - 41 U/L   ALT 14 14 - 54 U/L   Alkaline Phosphatase 91 38 - 126 U/L   Total Bilirubin 0.5 0.3 - 1.2 mg/dL   GFR calc non Af Amer >60 >60 mL/min   GFR calc Af Amer >60 >60 mL/min   Anion gap 8 5 - 15  Ethanol  Result Value Ref Range   Alcohol, Ethyl (B) 10 (H) <5 mg/dL  CBC with Differential  Result Value Ref Range   WBC 7.2 4.0 - 10.5 K/uL   RBC 5.22 (H) 3.87 - 5.11 MIL/uL   Hemoglobin 16.3 (H) 12.0 - 15.0 g/dL   HCT 46.9 (H) 36.0 - 46.0 %   MCV 89.8 78.0 - 100.0 fL   MCH 31.2 26.0 - 34.0 pg   MCHC 34.8 30.0 - 36.0 g/dL   RDW 14.9 11.5 - 15.5 %   Platelets 267 150 - 400 K/uL   Neutrophils Relative % 66 %   Neutro Abs 4.7 1.7 - 7.7 K/uL   Lymphocytes Relative 26 %   Lymphs Abs 1.9 0.7 - 4.0 K/uL   Monocytes Relative 7 %   Monocytes Absolute 0.5 0.1 - 1.0 K/uL   Eosinophils Relative 1 %   Eosinophils Absolute 0.1 0.0 - 0.7 K/uL   Basophils Relative 0 %   Basophils Absolute 0.0 0.0 - 0.1 K/uL  Troponin I  Result Value Ref Range   Troponin I <0.03 <0.03 ng/mL  D-dimer, quantitative  Result Value Ref Range   D-Dimer, Quant 0.51 (H) 0.00 - 0.50 ug/mL-FEU  Magnesium  Result Value Ref Range   Magnesium 1.7 1.7 - 2.4 mg/dL   Ct Angio Chest Pe W/cm &/or Wo Cm Result Date:  07/08/2016 CLINICAL DATA:  Acute onset of dizziness, lightheadedness and tachycardia. Elevated D-dimer. Initial encounter. EXAM: CT ANGIOGRAPHY CHEST WITH CONTRAST TECHNIQUE: Multidetector CT imaging of the chest was performed using the standard protocol during bolus administration of intravenous contrast. Multiplanar CT image reconstructions and MIPs were obtained to evaluate the vascular anatomy. CONTRAST:  100 mL of Isovue 370 IV contrast COMPARISON:  Chest radiograph performed 05/29/2016, and CTA of the chest performed 02/13/2016 FINDINGS: Cardiovascular:  There is no evidence of pulmonary embolus. The heart is unremarkable in appearance. The thoracic aorta is within normal limits. No calcific atherosclerotic disease is seen. The great vessels are grossly unremarkable in appearance. Incidental note is made of a direct origin of the left vertebral artery from the aortic arch. Mediastinum/Nodes: The mediastinum is unremarkable in appearance. No mediastinal lymphadenopathy is seen. No pericardial effusion is identified. The thyroid gland is unremarkable. No axillary lymphadenopathy is appreciated. Lungs/Pleura: Minimal bibasilar atelectasis is noted. The lungs are otherwise clear. No pleural effusion or pneumothorax is seen. No masses are identified. Scattered blebs are seen at the lung apices. Upper Abdomen: The visualized portions of the liver and spleen are grossly unremarkable. The visualized portions of the pancreas, adrenal glands and kidneys are unremarkable. Musculoskeletal: No acute osseous abnormalities are identified. Chronic bilateral rib deformities are noted. The visualized musculature is unremarkable in appearance. Thoracolumbar spinal fusion hardware is partially  imaged, with underlying chronic compression deformity of vertebral body T12. Review of the MIP images confirms the above findings. IMPRESSION: 1. No evidence of pulmonary embolus. 2. Minimal bibasilar atelectasis noted. Scattered blebs at  the lung apices. Lungs otherwise clear. 3. 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 Electronically Signed   By: Garald Balding M.D.   On: 07/08/2016 19:02   1910:  Workup reassuring. VS appear to be at baseline, per EPIC chart review. Pt has ambulated with steady gait and tol PO well without N/V.  No clear indication for admission at this time. Dx and testing d/w pt and family.  Questions answered.  Verb understanding, agreeable to d/c home with outpt f/u.   Final Clinical Impressions(s) / ED Diagnoses   Final diagnoses:  None    New Prescriptions New Prescriptions   No medications on file      Francine Graven, DO 07/10/16 2012

## 2016-07-08 NOTE — ED Notes (Signed)
Barbara Rogers called to inform of need of Mag-Ox 400 mg tablet.

## 2016-07-09 ENCOUNTER — Encounter: Payer: Self-pay | Admitting: Physician Assistant

## 2016-07-09 ENCOUNTER — Ambulatory Visit (HOSPITAL_COMMUNITY): Payer: BLUE CROSS/BLUE SHIELD | Admitting: Physical Therapy

## 2016-07-09 ENCOUNTER — Telehealth (HOSPITAL_COMMUNITY): Payer: Self-pay | Admitting: Physical Therapy

## 2016-07-09 NOTE — Telephone Encounter (Signed)
No Show #3: pt apologizes for missing appointment and reports she has been dealing with a lot of health issues recently. She would like to be discharged from PT until she can get her health back on track. Reminded her that she will need a new PT order in order to return and she verbalized understanding.   3:36 PM,07/09/16 Elly Modena PT, DPT [facility] [phone]

## 2016-07-11 ENCOUNTER — Encounter: Payer: Self-pay | Admitting: Physician Assistant

## 2016-07-11 ENCOUNTER — Encounter (HOSPITAL_COMMUNITY): Payer: Self-pay | Admitting: Physical Therapy

## 2016-07-12 NOTE — H&P (Signed)
Psychiatric Admission Assessment Adult  Patient Identification: Trinna Enzor MRN:  [id number] Date of Evaluation:  07/12/2016 Chief Complaint:  MDD, recurrent, severe without psychosis Principal Diagnosis: Polysubstance (excluding opioids) dependence, daily use ([place]) Diagnosis:   Patient Active Problem List   Diagnosis Date Noted  . Polysubstance (excluding opioids) dependence, daily use ([place]) [F19.20] 07/01/2016  . MDD (major depressive disorder), recurrent episode, severe ([place]) [F33.2] 06/30/2016  . Seizure-like activity ([place]) [R56.9] 06/29/2016  . MDD (major depressive disorder), recurrent severe, without psychosis ([place]) [F33.2] 06/27/2016  . OD (overdose of drug), intentional self-harm, initial encounter ([place]) [T50.902A] 06/26/2016  . Suicidal ideation [R45.851] 06/26/2016  . Clonidine overdose [T46.5X1A] 06/26/2016  . Bradycardia [R00.1] 06/26/2016  . Left scapula fracture [S42.102A] 02/09/2016  . Tobacco abuse [Z72.0]   . Adjustment disorder with mixed anxiety and depressed mood [F43.23]   . Dysphagia [R13.10]   . T12 burst fracture ([place]) [id number] 01/29/2016  . C3 cervical fracture ([place]) [S12.200A] 01/29/2016  . Fracture of left clavicle [S42.002A] 01/29/2016  . Multiple fractures of ribs of both sides [S22.43XA] 01/29/2016  . Cervical transverse process fracture ([facility]) [S12.9XXA] 01/29/2016  . Lumbar transverse process fracture ([place]) [S32.009A] 01/29/2016  . MDD (major depressive disorder), recurrent, severe, with psychosis ([place]) [F33.3] 01/19/2015  . Hypokalemia [E87.6] 01/18/2015  . Migraine without aura [G43.009]   . IUD (intrauterine device) in place [Z97.5] 06/02/2013  . Hypothyroid [E03.9] 12/09/2012  . Chronic left shoulder pain [M25.512, G89.29]   . Mood disorder ([place]) [F39] 03/25/2012  . AR (allergic rhinitis) [J30.9] 03/25/2012  . Dysmenorrhea [N94.6] 03/25/2012  . PMDD (premenstrual dysphoric disorder) [F32.81] 03/25/2012  . Chronic insomnia [F51.04]  03/25/2012   History of Present Illness: Patient visited emergency room and appears that she was discharged and needs a new history and physical on the chart. No new information is available please see old records for details Associated Signs/Symptoms: Depression Symptoms:  depressed mood, (Hypo) Manic Symptoms:  none Anxiety Symptoms:  Excessive Worry, Psychotic Symptoms:  none PTSD Symptoms: Negative Total Time spent with patient: n/a  Past Psychiatric History: see records  Is the patient at risk to self? No.  Has the patient been a risk to self in the past 6 months? No.  Has the patient been a risk to self within the distant past? No.  Is the patient a risk to others? No.  Has the patient been a risk to others in the past 6 months? No.  Has the patient been a risk to others within the distant past? No.   Prior Inpatient Therapy:   Prior Outpatient Therapy:    Alcohol Screening: 1. How often do you have a drink containing alcohol?: Never 9. Have you or someone else been injured as a result of your drinking?: No 10. Has a relative or friend or a doctor or another health worker been concerned about your drinking or suggested you cut down?: No Alcohol Use Disorder Identification Test Final Score (AUDIT): 0 Brief Intervention: AUDIT score less than 7 or less-screening does not suggest unhealthy drinking-brief intervention not indicated Substance Abuse History in the last 12 months:  Yes.   Consequences of Substance Abuse: NA Previous Psychotropic Medications: Yes  Psychological Evaluations: Yes  Past Medical History:  Past Medical History:  Diagnosis Date  . AKI (acute kidney injury) ([place]) 01/18/2015  . Allergy   . Anxiety   . Asthma    exacerbated by bronchitisi  . Asthma   . C3 cervical fracture ([place])   . Cervical  transverse process fracture ([place])   . Chronic left shoulder pain 08/2012  . Clavicle fracture   . Concussion 01/29/2016  . Depression   . 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 Dysmenorrhea   . Dysphagia   . Finger fracture, left 10/31/2012   LEFT 4th finger  . Insomnia   . Lumbar transverse process fracture ([place])   . Migraine without aura   . Mood disorder ([place])   . Nephrolithiasis   . PMDD (premenstrual dysphoric disorder)   . Respiratory failure ([place])   . Seizure-like activity ([place]) 06/30/2016   normal EEG, question pseudoseizures  . T12 burst fracture ([place])   . Traumatic hemo-pneumothorax   . Traumatic hemopneumothorax 02/09/2016    Past Surgical History:  Procedure Laterality Date  . FRACTURE SURGERY    . POSTERIOR LUMBAR FUSION 4 LEVEL N/A 01/30/2016   Procedure: T9-L2 Posterior Stabilization, Posterior Lumbar Fusion with Pedicle Screws;  Surgeon: Kary Kos, MD;  Location: [facility];  Service: Neurosurgery;  Laterality: N/A;   Family History:  Family History  Problem Relation Age of Onset  . Migraines Mother   . Mental illness Mother   . Heart disease Father   . Alcohol abuse Father   . Arthritis Maternal Grandmother   . Thyroid disease Maternal Grandmother   . Heart disease Maternal Grandfather     AMI 1996, 2014  . Anemia Maternal Grandfather     bone marrow dysfunction   Family Psychiatric  History: noneknown Tobacco Screening: Have you used any form of tobacco in the last 30 days? (Cigarettes, Smokeless Tobacco, Cigars, and/or Pipes): Patient Refused Screening Social History:  History  Alcohol Use  . 0.0 oz/week    Comment: occasionally     History  Drug Use  . Types: Marijuana    Additional Social History:      Pain Medications: none Prescriptions: none Over the Counter: none History of alcohol / drug use?: No history of alcohol / drug abuse                    Allergies:   Allergies  Allergen Reactions  . Penicillins Other (See Comments)    Unknown Has patient had a PCN reaction causing immediate rash, facial/tongue/throat swelling, SOB or lightheadedness with hypotension:   unknown Has patient had a PCN reaction causing severe rash involving mucus membranes or skin necrosis:  unknown Has patient had a PCN reaction that required hospitalization  unknown Has patient had a PCN reaction occurring within the last 10 years: unknown If all of the above answers are "NO", the  . Progestins     Extreme moodiness  . Progestins Other (See Comments)    Extreme moodiness  . Sinequan [Doxepin] Other (See Comments) and Hypertension    Hallucinations, delusions, severe agitation  . Sinequan [Doxepin] Other (See Comments)    hallucinations   Lab Results: No results found for this or any previous visit (from the past 48 hour(s)).  Blood Alcohol level:  Lab Results  Component Value Date   ETH 10 (H) 07/08/2016   ETH 69 (H) [id number]    Metabolic Disorder Labs:  Lab Results  Component Value Date   HGBA1C 4.8 07/16/2013   No results found for: PROLACTIN Lab Results  Component Value Date   TRIG 190 (H) 02/02/2016    Current Medications: No current facility-administered medications for this encounter.    Current Outpatient Prescriptions  Medication Sig Dispense Refill  . albuterol (PROVENTIL HFA;VENTOLIN HFA)  108 (90 Base) MCG/ACT inhaler Inhale 2 puffs into the lungs every 4 (four) hours as needed for wheezing or shortness of breath (cough, shortness of breath or wheezing.). 1 Inhaler 1  . cyclobenzaprine (FLEXERIL) 10 MG tablet Take 10 mg by mouth daily as needed.    Marland Kitchen ibuprofen (ADVIL,MOTRIN) 800 MG tablet Take 800 mg by mouth every 8 (eight) hours as needed for mild pain or moderate pain.    Marland Kitchen levonorgestrel (MIRENA) 20 MCG/24HR IUD 1 each by Intrauterine route once.    Marland Kitchen levothyroxine (SYNTHROID, LEVOTHROID) 50 MCG tablet Take 1 tablet (50 mcg total) by mouth daily (Patient not taking: Reported on 07/08/2016) 90 tablet 0  . nicotine (NICODERM CQ - DOSED IN MG/24 HOURS) 21 mg/24hr patch Place 1 patch (21 mg total) onto the skin daily at 6 (six) AM. (Patient not  taking: Reported on 07/08/2016) 28 patch 0  . Phenyleph-CPM-DM-Aspirin (ALKA-SELTZER PLUS COLD & COUGH) 7.04-10-09-325 MG TBEF Take 1 packet by mouth daily as needed (for cold symptoms).    . pregabalin (LYRICA) 200 MG capsule Take 1 capsule (200 mg total) by mouth 3 (three) times daily. (Patient not taking: Reported on 07/08/2016) 270 capsule 1  . traZODone (DESYREL) 50 MG tablet Take 1 tablet (50 mg total) by mouth at bedtime as needed for sleep. (Patient not taking: Reported on 07/08/2016) 30 tablet 0  . zolpidem (AMBIEN) 10 MG tablet Take 5-10 mg by mouth at bedtime as needed for sleep.     PTA Medications: No prescriptions prior to admission.    Musculoskeletal: Strength & Muscle Tone: within normal limits Gait & Station: normal Patient leans: N/A  Psychiatric Specialty Exam: Physical Exam  ROS  Blood pressure 117/82, pulse 63, temperature 97.8 F (36.6 C), temperature source Oral, resp. rate 17, height 5\' 9"  (1.753 m), weight 77.1 kg (170 lb), last menstrual period 06/26/2016.Body mass index is 25.1 kg/m.   General Appearance: Casual  Eye Contact::  Good  Speech:  Clear and Coherent409  Volume:  Normal  Mood:  Euthymic  Affect:  Congruent  Thought Process:  Goal Directed  Orientation:  Negative  Thought Content:  Negative  Suicidal Thoughts:  No  Homicidal Thoughts:  No  Memory:  Negative  Judgement:  Impaired  Insight:  Shallow  Psychomotor Activity:  Normal  Concentration:  Good  Recall:  Good  Fund of Knowledge:Good  Language: Good  Akathisia:  No  Handed:  Right  AIMS (if indicated):     Assets:  Resilience  Sleep:  Number of Hours: 2.75  Cognition: WNL  ADL's:  Intact    Treatment Plan Summary: Patient currently has been discharged and is following up with outpatient providers. At the time of discharge she did deny any suicidal or homicidal ideation, plan or intent.  Observation Level/Precautions:  15 minute checks  Laboratory:  see labs  Psychotherapy:     Medications:    Consultations:    Discharge Concerns:    Estimated LOS:  Other:     Physician Treatment Plan for Primary Diagnosis: Polysubstance (excluding opioids) dependence, daily use ([place]) Long Term Goal(s): Improvement in symptoms so as ready for discharge  Short Term Goals: Ability to disclose and discuss suicidal ideas  Physician Treatment Plan for Secondary Diagnosis: Principal Problem:   Polysubstance (excluding opioids) dependence, daily use ([place]) Active Problems:   MDD (major depressive disorder), recurrent episode, severe ([place])  Long Term Goal(s): Improvement in symptoms so as ready for discharge  Short Term Goals: Ability to  identify changes in lifestyle to reduce recurrence of condition will improve  I certify that inpatient services furnished can reasonably be expected to improve the patient's condition.    Linard Millers, MD 11/3/20172:39 PM

## 2016-07-16 ENCOUNTER — Encounter (HOSPITAL_COMMUNITY): Payer: Self-pay | Admitting: Physical Therapy

## 2016-07-18 ENCOUNTER — Encounter (HOSPITAL_COMMUNITY): Payer: Self-pay | Admitting: Physical Therapy

## 2016-07-23 ENCOUNTER — Encounter (HOSPITAL_COMMUNITY): Payer: Self-pay | Admitting: Physical Therapy

## 2016-07-25 ENCOUNTER — Encounter (HOSPITAL_COMMUNITY): Payer: Self-pay | Admitting: Physical Therapy

## 2016-08-08 ENCOUNTER — Encounter: Payer: Self-pay | Admitting: Physician Assistant

## 2016-08-12 MED ORDER — ALBUTEROL SULFATE HFA 108 (90 BASE) MCG/ACT IN AERS: 2.0000 | INHALATION_SPRAY | RESPIRATORY_TRACT | 1 refills | Status: DC | PRN

## 2016-08-20 ENCOUNTER — Telehealth: Payer: Self-pay

## 2016-08-20 DIAGNOSIS — G8929 Other chronic pain: Secondary | ICD-10-CM

## 2016-08-20 DIAGNOSIS — M25512 Pain in left shoulder: Principal | ICD-10-CM

## 2016-08-20 NOTE — Telephone Encounter (Signed)
Pt is calling and would like a referral to a new  pain management place  pt is unhappy with where she is going now the new referral she wants is to preferred pain management

## 2016-08-21 ENCOUNTER — Emergency Department (HOSPITAL_COMMUNITY): Payer: BLUE CROSS/BLUE SHIELD

## 2016-08-21 ENCOUNTER — Emergency Department (HOSPITAL_COMMUNITY)
Admission: EM | Admit: 2016-08-21 | Discharge: 2016-08-21 | Disposition: A | Discharge status: Home / Self Care | Payer: BLUE CROSS/BLUE SHIELD | Attending: Emergency Medicine | Admitting: Emergency Medicine

## 2016-08-21 ENCOUNTER — Encounter (HOSPITAL_COMMUNITY): Payer: Self-pay | Admitting: *Deleted

## 2016-08-21 DIAGNOSIS — Y999 Unspecified external cause status: Secondary | ICD-10-CM | POA: Diagnosis not present

## 2016-08-21 DIAGNOSIS — J45909 Unspecified asthma, uncomplicated: Secondary | ICD-10-CM | POA: Diagnosis not present

## 2016-08-21 DIAGNOSIS — Z79899 Other long term (current) drug therapy: Secondary | ICD-10-CM | POA: Insufficient documentation

## 2016-08-21 DIAGNOSIS — F17210 Nicotine dependence, cigarettes, uncomplicated: Secondary | ICD-10-CM | POA: Diagnosis not present

## 2016-08-21 DIAGNOSIS — F1290 Cannabis use, unspecified, uncomplicated: Secondary | ICD-10-CM | POA: Diagnosis not present

## 2016-08-21 DIAGNOSIS — Y93E5 Activity, floor mopping and cleaning: Secondary | ICD-10-CM | POA: Insufficient documentation

## 2016-08-21 DIAGNOSIS — E039 Hypothyroidism, unspecified: Secondary | ICD-10-CM | POA: Insufficient documentation

## 2016-08-21 DIAGNOSIS — W19XXXA Unspecified fall, initial encounter: Secondary | ICD-10-CM

## 2016-08-21 DIAGNOSIS — Y929 Unspecified place or not applicable: Secondary | ICD-10-CM | POA: Insufficient documentation

## 2016-08-21 DIAGNOSIS — W1809XA Striking against other object with subsequent fall, initial encounter: Secondary | ICD-10-CM | POA: Diagnosis not present

## 2016-08-21 DIAGNOSIS — M546 Pain in thoracic spine: Secondary | ICD-10-CM | POA: Insufficient documentation

## 2016-08-21 DIAGNOSIS — S20311A Abrasion of right front wall of thorax, initial encounter: Secondary | ICD-10-CM | POA: Diagnosis not present

## 2016-08-21 DIAGNOSIS — T148XXA Other injury of unspecified body region, initial encounter: Secondary | ICD-10-CM

## 2016-08-21 DIAGNOSIS — S299XXA Unspecified injury of thorax, initial encounter: Secondary | ICD-10-CM | POA: Diagnosis present

## 2016-08-21 LAB — PREGNANCY, URINE: PREG TEST UR: NEGATIVE

## 2016-08-21 MED ORDER — TRAMADOL HCL 50 MG PO TABS: 50.0000 mg | ORAL_TABLET | Freq: Four times a day (QID) | ORAL | 0 refills | Status: DC | PRN

## 2016-08-21 MED ORDER — KETOROLAC TROMETHAMINE 30 MG/ML IJ SOLN
30.0000 mg | Freq: Once | INTRAMUSCULAR | Status: AC
  Administered 2016-08-21: 30 mg via INTRAMUSCULAR
  Filled 2016-08-21: qty 1

## 2016-08-21 MED ORDER — NAPROXEN 500 MG PO TABS: 500.0000 mg | ORAL_TABLET | Freq: Two times a day (BID) | ORAL | 0 refills | Status: DC

## 2016-08-21 MED ORDER — OXYCODONE-ACETAMINOPHEN 5-325 MG PO TABS
1.0000 | ORAL_TABLET | Freq: Once | ORAL | Status: AC
  Administered 2016-08-21: 1 via ORAL
  Filled 2016-08-21: qty 1

## 2016-08-21 NOTE — Discharge Instructions (Signed)
You were seen today for back pain following a fall. Are your x-ray imaging is reassuring. There is no new fracture. Supportive measures at home including naproxen for pain control. Continue your [place].  He will be given a 2 day supply of tramadol. If you require further pain medication, you need to follow-up with your primary physician.

## 2016-08-21 NOTE — ED Provider Notes (Signed)
[facility] DEPT Provider Note   CSN: [id number] Arrival date & time: 08/21/16  0132     History   Chief Complaint No chief complaint on file.   HPI Anneka Mullaly is a 35 y.o. female.  HPI  This is a 35 year old female with history of asthma, chronic back pain, back injury and surgery who presents following a fall. Patient reports a fall from standing. She states that approximately 2 hours ago she was mopping and fell backwards hitting a table and a small cut. She scraped her upper back. She reports "100 out of 10 pain" of the mid back and lower back. Denies any weakness, numbness, tingling of the legs.  Denies any bowel or bladder difficulty. Denies any other injury. Denies chest pain or shortness of breath. She states that she took ibuprofen with minimal relief.  Past Medical History:  Diagnosis Date  . AKI (acute kidney injury) ([place]) 01/18/2015  . Allergy   . Anxiety   . Asthma    exacerbated by bronchitisi  . Asthma   . C3 cervical fracture ([place])   . Cervical transverse process fracture ([place])   . Chronic left shoulder pain 08/2012  . Clavicle fracture   . Concussion 01/29/2016  . Depression   . 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 Dysmenorrhea   . Dysphagia   . Finger fracture, left 10/31/2012   LEFT 4th finger  . Insomnia   . Lumbar transverse process fracture ([place])   . Migraine without aura   . Mood disorder ([place])   . Nephrolithiasis   . PMDD (premenstrual dysphoric disorder)   . Respiratory failure ([place])   . Seizure-like activity ([place]) 06/30/2016   normal EEG, question pseudoseizures  . T12 burst fracture ([place])   . Traumatic hemo-pneumothorax   . Traumatic hemopneumothorax 02/09/2016    Patient Active Problem List   Diagnosis Date Noted  . Polysubstance (excluding opioids) dependence, daily use ([place]) 07/01/2016  . MDD (major depressive disorder), recurrent episode, severe ([place]) 06/30/2016  . Seizure-like activity ([place]) 06/29/2016  .  MDD (major depressive disorder), recurrent severe, without psychosis ([place]) 06/27/2016  . OD (overdose of drug), intentional self-harm, initial encounter ([place]) 06/26/2016  . Suicidal ideation 06/26/2016  . Clonidine overdose 06/26/2016  . Bradycardia 06/26/2016  . Left scapula fracture 02/09/2016  . Tobacco abuse   . Adjustment disorder with mixed anxiety and depressed mood   . Dysphagia   . T12 burst fracture ([place]) 01/29/2016  . C3 cervical fracture ([place]) 01/29/2016  . Fracture of left clavicle 01/29/2016  . Multiple fractures of ribs of both sides 01/29/2016  . Cervical transverse process fracture ([place]) 01/29/2016  . Lumbar transverse process fracture ([place]) 01/29/2016  . MDD (major depressive disorder), recurrent, severe, with psychosis ([place]) 01/19/2015  . Hypokalemia 01/18/2015  . Migraine without aura   . IUD (intrauterine device) in place 06/02/2013  . Hypothyroid 12/09/2012  . Chronic left shoulder pain   . Mood disorder ([place]) 03/25/2012  . AR (allergic rhinitis) 03/25/2012  . Dysmenorrhea 03/25/2012  . PMDD (premenstrual dysphoric disorder) 03/25/2012  . Chronic insomnia 03/25/2012    Past Surgical History:  Procedure Laterality Date  . FRACTURE SURGERY    . POSTERIOR LUMBAR FUSION 4 LEVEL N/A 01/30/2016   Procedure: T9-L2 Posterior Stabilization, Posterior Lumbar Fusion with Pedicle Screws;  Surgeon: Kary Kos, MD;  Location: [facility];  Service: Neurosurgery;  Laterality: N/A;    OB History    Gravida Para Term Preterm AB Living  3 0 0 0 3     SAB TAB Ectopic Multiple Live Births   0 3 0           Home Medications    Prior to Admission medications   Medication Sig Start Date End Date Taking? Authorizing Provider  albuterol (PROVENTIL HFA;VENTOLIN HFA) 108 (90 Base) MCG/ACT inhaler Inhale 2 puffs into the lungs every 4 (four) hours as needed for wheezing or shortness of breath (cough, shortness of breath or wheezing.). 08/12/16  Yes [name] Jeffery, PA-C    ibuprofen (ADVIL,MOTRIN) 800 MG tablet Take 800 mg by mouth every 8 (eight) hours as needed for mild pain or moderate pain.   Yes Historical Provider, MD  levothyroxine (SYNTHROID, LEVOTHROID) 50 MCG tablet Take 1 tablet (50 mcg total) by mouth daily 09/07/15  Yes [name] Jeffery, PA-C  pregabalin (LYRICA) 200 MG capsule Take 1 capsule (200 mg total) by mouth 3 (three) times daily. 01/18/16  Yes [name] Jeffery, PA-C  cyclobenzaprine (FLEXERIL) 10 MG tablet Take 10 mg by mouth daily as needed. 06/03/16   Historical Provider, MD  levonorgestrel (MIRENA) 20 MCG/24HR IUD 1 each by Intrauterine route once.    Historical Provider, MD  naproxen (NAPROSYN) 500 MG tablet Take 1 tablet (500 mg total) by mouth 2 (two) times daily. 08/21/16   Merryl Hacker, MD  nicotine (NICODERM CQ - DOSED IN MG/24 HOURS) 21 mg/24hr patch Place 1 patch (21 mg total) onto the skin daily at 6 (six) AM. Patient not taking: Reported on 07/08/2016 07/02/16   Derrill Center, NP  Phenyleph-CPM-DM-Aspirin (ALKA-SELTZER PLUS COLD & COUGH) 7.04-10-09-325 MG TBEF Take 1 packet by mouth daily as needed (for cold symptoms).    Historical Provider, MD  traMADol (ULTRAM) 50 MG tablet Take 1 tablet (50 mg total) by mouth every 6 (six) hours as needed. 08/21/16   Merryl Hacker, MD  traZODone (DESYREL) 50 MG tablet Take 1 tablet (50 mg total) by mouth at bedtime as needed for sleep. Patient not taking: Reported on 07/08/2016 07/01/16   Derrill Center, NP  zolpidem (AMBIEN) 10 MG tablet Take 5-10 mg by mouth at bedtime as needed for sleep.    Historical Provider, MD    Family History Family History  Problem Relation Age of Onset  . Migraines Mother   . Mental illness Mother   . Heart disease Father   . Alcohol abuse Father   . Arthritis Maternal Grandmother   . Thyroid disease Maternal Grandmother   . Heart disease Maternal Grandfather     AMI 1996, 2014  . Anemia Maternal Grandfather     bone marrow dysfunction    Social  History Social History  Substance Use Topics  . Smoking status: Current Every Day Smoker    Packs/day: 1.00    Types: Cigarettes  . Smokeless tobacco: Never Used  . Alcohol use 0.0 oz/week     Comment: occasionally     Allergies   Penicillins; Progestins; Progestins; Sinequan [doxepin]; and Sinequan [doxepin]   Review of Systems Review of Systems  Respiratory: Negative for shortness of breath.   Cardiovascular: Negative for chest pain.  Gastrointestinal: Negative for nausea and vomiting.  Musculoskeletal: Positive for back pain.  Skin: Negative for wound.  Neurological: Negative for weakness and numbness.  All other systems reviewed and are negative.    Physical Exam Updated Vital Signs BP 106/78   Pulse 85   Temp 97.8 F (36.6 C) (Oral)   Resp 16   Ht  5\' 7"  (1.702 m)   Wt 170 lb (77.1 kg)   SpO2 98%   BMI 26.63 kg/m   Physical Exam  Constitutional: She is oriented to person, place, and time.  Disheveled appearing, no acute distress  HENT:  Head: Normocephalic and atraumatic.  Eyes: Pupils are equal, round, and reactive to light.  Neck: Normal range of motion. Neck supple.  No midline C-spine tenderness to palpation, step-off, or deformity  Cardiovascular: Normal rate, regular rhythm and normal heart sounds.   Pulmonary/Chest: Effort normal and breath sounds normal. No respiratory distress. She has no wheezes. She exhibits tenderness.  Right posterior chest wall tenderness  Abdominal: Soft. Bowel sounds are normal. There is no tenderness. There is no guarding.  Musculoskeletal:  Tenderness to palpation mid thoracic through upper lumbar spine, no step-off or deformity noted, surgical incisions well-healed  Neurological: She is alert and oriented to person, place, and time.  Skin: Skin is warm and dry.  6 cm abrasion noted over the right posterior chest wall  Psychiatric: She has a normal mood and affect.  Nursing note and vitals reviewed.    ED Treatments  / Results  Labs (all labs ordered are listed, but only abnormal results are displayed) Labs Reviewed  PREGNANCY, URINE    EKG  EKG Interpretation None       Radiology Dg Chest 2 View  Result Date: 08/21/2016 CLINICAL DATA:  Fall with upper back pain. EXAM: CHEST  2 VIEW COMPARISON:  Chest CT 07/08/2016 FINDINGS: Cardiomediastinal contours are normal. No pneumothorax or pleural effusion. No focal airspace consolidation or pulmonary edema. IMPRESSION: Clear lungs. Electronically Signed   By: Ulyses Jarred M.D.   On: 08/21/2016 03:31   Dg Thoracic Spine 2 View  Result Date: 08/21/2016 CLINICAL DATA:  Status post fall. Laceration of the upper back. Back pain. EXAM: THORACIC SPINE 2 VIEWS COMPARISON:  Chest radiograph 08/21/2016 and chest CT 07/08/2016 FINDINGS: Thoracolumbar posterior spinal fusion hardware traversing a compression fracture of the T12 vertebral body is unchanged. There is no perihardware lucency or periprosthetic fracture. The hardware itself remains intact. No new thoracic compression fracture. There are multiple healed posterior right rib fractures. IMPRESSION: Unchanged appearance of compression fracture at the thoracolumbar junction. No focal hardware abnormality of the T9-L2 posterior spinal fusion hardware. Electronically Signed   By: Ulyses Jarred M.D.   On: 08/21/2016 03:29   Dg Lumbar Spine Complete  Result Date: 08/21/2016 CLINICAL DATA:  35 y/o F; status post fall with laceration to the upper back and increased back pain. EXAM: LUMBAR SPINE - COMPLETE 4+ VIEW COMPARISON:  06/06/2016 lumbar spine radiographs. Lumbar spine CT dated 06/06/2016. FINDINGS: T9 to L2 posterior instrumented fusion apparatus. Hardware appears intact. Stable loss of height of the T12 vertebral body. Lumbar spondylosis with mild loss of intervertebral disc space height at the L4-5 and L5-S1 levels. No new loss of vertebral body height. Question L5 pars defects. Calcific densities projecting  over the right kidney may represent stones. Normal bowel gas pattern IMPRESSION: 1. Question L5 pars defects, not present on prior CT. If there are symptoms attributable to this level consider lumbar CT. 2. Otherwise no acute fracture or dislocation identified. Electronically Signed   By: Kristine Garbe M.D.   On: 08/21/2016 03:30   Ct Lumbar Spine Wo Contrast  Result Date: 08/21/2016 CLINICAL DATA:  35 year old female with fall and low back pain. EXAM: CT LUMBAR SPINE WITHOUT CONTRAST TECHNIQUE: Multidetector CT imaging of the lumbar spine was performed without intravenous  contrast administration. Multiplanar CT image reconstructions were also generated. COMPARISON:  Lumbar spine radiograph dated 08/21/2016 and CT dated 04/23/2016 FINDINGS: Segmentation: 5 lumbar type vertebrae. Alignment: Normal. Vertebrae: No acute fracture. The lumbar spine vertebral body heights and disc spaces are maintained. There is old burst fracture of the T12 vertebra with approximately 5 mm retropulsion of the superior posterior fragment similar to the prior CT. There is associated focal narrowing of the central canal with no interval change since the prior CT. Partially visualized posterior spinal fusion hardware noted with pedicle screws and L1 and L2. The visualized hardware appears intact. There are old fractures of the right L2, L3 and L5 transverse processes. Paraspinal and other soft tissues: No acute findings. A 5 mm right renal calcification appears to be parenchymal under was present on the prior CT. Disc levels: The lumbar discs spaces are preserved. IMPRESSION: No acute/traumatic lumbar spine pathology. T12 burst fracture with retropulsed fragment as seen on the prior CT. Old right L2, L3, and L5 transverse process fractures. Partially visualized posterior lower thoracic fixation hardware extending to the level of the L2 vertebra. The visualized orthopedic hardware appears intact. Electronically Signed   By:  Anner Crete M.D.   On: 08/21/2016 04:10    Procedures Procedures (including critical care time)  Medications Ordered in ED Medications  oxyCODONE-acetaminophen (PERCOCET/ROXICET) 5-325 MG per tablet 1 tablet (not administered)  ketorolac (TORADOL) 30 MG/ML injection 30 mg (30 mg Intramuscular Given 08/21/16 0221)  oxyCODONE-acetaminophen (PERCOCET/ROXICET) 5-325 MG per tablet 1 tablet (1 tablet Oral Given 08/21/16 0221)     Initial Impression / Assessment and Plan / ED Course  I have reviewed the triage vital signs and the nursing notes.  Pertinent labs & imaging results that were available during my care of the patient were reviewed by me and considered in my medical decision making (see chart for details).  Clinical Course     Patient presents with back pain. This is in the setting of an acute fall. She does have evidence of an abrasion over her back. Otherwise nontoxic. Neurologically intact. X-rays obtained. Questionable pars deformity. CT obtained and reassuring. Hardware appears in place. Suspect musculoskeletal pain. I have reviewed the narcotic database. Patient receives monthly Butrans as well as Xanax and she occasionally have a prescription for oxycodone, last filled 07/18/2016 #90.  Patient reports that she does not have anymore. Given that she had an acute fall, I will prescribe her 2 days worth of tramadol. If she requires further pain medication for breakthrough pain, she needs follow-up with her primary physician.  After history, exam, and medical workup I feel the patient has been appropriately medically screened and is safe for discharge home. Pertinent diagnoses were discussed with the patient. Patient was given return precautions.   Final Clinical Impressions(s) / ED Diagnoses   Final diagnoses:  Fall, initial encounter  Abrasion  Midline thoracic back pain, unspecified chronicity    New Prescriptions New Prescriptions   NAPROXEN (NAPROSYN) 500 MG TABLET     Take 1 tablet (500 mg total) by mouth 2 (two) times daily.   TRAMADOL (ULTRAM) 50 MG TABLET    Take 1 tablet (50 mg total) by mouth every 6 (six) hours as needed.     Merryl Hacker, MD 08/21/16 [id number]

## 2016-08-21 NOTE — ED Triage Notes (Signed)
PT states about 1 hour ago she was mopping and she hit a table and she fell onto a small clock. C/o laceration to the upper back and increased back pain. Pt states she has chronic back pain.

## 2016-08-21 NOTE — ED Notes (Signed)
Pt stated that she was hurting so bad that she needed an iv dr notified no new orders that this time. Pt drowsy at this time

## 2016-08-22 NOTE — Telephone Encounter (Signed)
Routed to [place].

## 2016-08-29 NOTE — Addendum Note (Signed)
Addended by: Fara Chute on: 08/29/2016 09:07 AM   Modules accepted: Orders

## 2016-08-29 NOTE — Telephone Encounter (Signed)
Referral placed.

## 2016-08-30 ENCOUNTER — Ambulatory Visit (HOSPITAL_COMMUNITY): Payer: BLUE CROSS/BLUE SHIELD | Attending: Neurosurgery | Admitting: Physical Therapy

## 2016-08-30 DIAGNOSIS — R29898 Other symptoms and signs involving the musculoskeletal system: Secondary | ICD-10-CM | POA: Insufficient documentation

## 2016-08-30 DIAGNOSIS — R293 Abnormal posture: Secondary | ICD-10-CM | POA: Insufficient documentation

## 2016-08-30 DIAGNOSIS — R252 Cramp and spasm: Secondary | ICD-10-CM | POA: Diagnosis present

## 2016-08-30 DIAGNOSIS — M6281 Muscle weakness (generalized): Secondary | ICD-10-CM | POA: Diagnosis present

## 2016-08-30 NOTE — Patient Instructions (Signed)
   HAMSTRING STRETCH - TABLE, BED OR COUCH  Sit on a raised flat surface where you can prop your affected leg up on it such as a treatment table, couch or bed.    While keeping your knee straight to slightly bent, slowly lean forward and reach your hands towards your foot until a gentle stretch is felt along the back of your knee/thigh.   Hold for 30 seconds, and repeat twice each side, twice a day.    PIRIFORMIS AND HIP STRETCH - SEATED  While sitting in a chair, cross your affected leg on top of the other as shown.   Next, gently lean forward until a stretch is felt along the crossed leg.  DO NOT FLEX AT YOUR BACK LIKE THE PICTURE SHOWS- KEEP YOUR BACK STRAIGHT!!  Hold for 30 seconds and repeat 2 times each side, twice a day.    Lumbar Rotations   Lying on your back with your knees bent, slowly drop your legs to one side and hold the stretch. Come back to the middle and switch sides. You should feel the stretch in your back on the opposite side that your legs are leaning.  Hold for 10 seconds, and repeat 5 times each side, twice a day.
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
## 2016-09-03 ENCOUNTER — Encounter: Payer: Self-pay | Admitting: Physician Assistant

## 2016-09-03 ENCOUNTER — Telehealth (HOSPITAL_COMMUNITY): Payer: Self-pay | Admitting: Physician Assistant

## 2016-09-03 NOTE — Telephone Encounter (Signed)
09/03/16  Pt said that she is having trouble trying to pay her bill because she is on disability and isn't making as much money as she would if she is working.  She said that once she gets back to work she will be able to pay as she should.  She is just afraid that we wont see her if she doesn't pay.  She said that she spoke to someone in the main billing department was told they couldn't help her because nothing had been billed yet.  She just started on 12/22 so she wouldn't have a bill yet.

## 2016-09-04 ENCOUNTER — Encounter (HOSPITAL_COMMUNITY): Payer: Self-pay | Admitting: Physical Therapy

## 2016-09-12 ENCOUNTER — Ambulatory Visit (HOSPITAL_COMMUNITY): Payer: BLUE CROSS/BLUE SHIELD

## 2016-09-17 ENCOUNTER — Ambulatory Visit (HOSPITAL_COMMUNITY): Payer: BLUE CROSS/BLUE SHIELD

## 2016-09-17 ENCOUNTER — Telehealth (HOSPITAL_COMMUNITY): Payer: Self-pay | Admitting: Physician Assistant

## 2016-09-17 NOTE — Telephone Encounter (Signed)
09/17/16 pt cx because she said that her water just got turned back on and she hasn't bathed in a few days and thought she would be able to before coming here and not going to happen

## 2016-09-19 ENCOUNTER — Ambulatory Visit (HOSPITAL_COMMUNITY): Payer: BLUE CROSS/BLUE SHIELD | Attending: Neurosurgery | Admitting: Physical Therapy

## 2016-09-19 DIAGNOSIS — M6281 Muscle weakness (generalized): Secondary | ICD-10-CM

## 2016-09-19 DIAGNOSIS — R293 Abnormal posture: Secondary | ICD-10-CM | POA: Insufficient documentation

## 2016-09-19 DIAGNOSIS — R252 Cramp and spasm: Secondary | ICD-10-CM | POA: Diagnosis present

## 2016-09-19 DIAGNOSIS — R29898 Other symptoms and signs involving the musculoskeletal system: Secondary | ICD-10-CM
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
## 2016-09-19 NOTE — Patient Instructions (Signed)
Hamstring Stretch    With other leg bent, foot flat, grasp right leg and slowly try to straighten knee. Hold 30 seconds. Repeat 3 times. Do 2 sessions per day. May used belt, sheet or dog leash attached to foot for ease.  http://gt2.exer.us/280   Copyright  VHI. All rights reserved.

## 2016-09-20 ENCOUNTER — Emergency Department (HOSPITAL_COMMUNITY)
Admission: EM | Admit: 2016-09-20 | Discharge: 2016-09-20 | Disposition: A | Discharge status: Home / Self Care | Payer: BLUE CROSS/BLUE SHIELD | Attending: Emergency Medicine | Admitting: Emergency Medicine

## 2016-09-20 ENCOUNTER — Emergency Department (HOSPITAL_COMMUNITY): Payer: BLUE CROSS/BLUE SHIELD

## 2016-09-20 ENCOUNTER — Encounter (HOSPITAL_COMMUNITY): Payer: Self-pay | Admitting: Emergency Medicine

## 2016-09-20 DIAGNOSIS — Z79899 Other long term (current) drug therapy: Secondary | ICD-10-CM | POA: Diagnosis not present

## 2016-09-20 DIAGNOSIS — J45909 Unspecified asthma, uncomplicated: Secondary | ICD-10-CM | POA: Diagnosis not present

## 2016-09-20 DIAGNOSIS — W01198A Fall on same level from slipping, tripping and stumbling with subsequent striking against other object, initial encounter: Secondary | ICD-10-CM | POA: Insufficient documentation

## 2016-09-20 DIAGNOSIS — S0083XA Contusion of other part of head, initial encounter: Secondary | ICD-10-CM | POA: Insufficient documentation

## 2016-09-20 DIAGNOSIS — F17210 Nicotine dependence, cigarettes, uncomplicated: Secondary | ICD-10-CM | POA: Insufficient documentation

## 2016-09-20 DIAGNOSIS — M25512 Pain in left shoulder: Secondary | ICD-10-CM | POA: Diagnosis not present

## 2016-09-20 DIAGNOSIS — Y999 Unspecified external cause status: Secondary | ICD-10-CM | POA: Insufficient documentation

## 2016-09-20 DIAGNOSIS — Y929 Unspecified place or not applicable: Secondary | ICD-10-CM | POA: Diagnosis not present

## 2016-09-20 DIAGNOSIS — Y939 Activity, unspecified: Secondary | ICD-10-CM | POA: Diagnosis not present

## 2016-09-20 DIAGNOSIS — W19XXXA Unspecified fall, initial encounter: Secondary | ICD-10-CM

## 2016-09-20 DIAGNOSIS — S0990XA Unspecified injury of head, initial encounter: Secondary | ICD-10-CM

## 2016-09-20 LAB — POC URINE PREG, ED: Preg Test, Ur: NEGATIVE

## 2016-09-20 NOTE — Discharge Instructions (Signed)
Please avoid alcohol for the next week.  Please rest and drink plenty of water.  We recommend that you avoid any activity that may lead to another head injury for at least 1 week or until your symptoms have completely resolved.  We also recommend "brain rest" - please avoid TV, cell phones, tablets, computers as much as possible for the next 48 hours.  You may alternate between Tylenol 1000 mg every 6 hours as needed for pain and ibuprofen 800 mg every 8 hours as needed for pain. Please do not take Tylenol with alcohol.
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
## 2016-09-20 NOTE — ED Provider Notes (Signed)
TIME SEEN: 3:40 AM  CHIEF COMPLAINT: Fall, head injury  HPI: Pt is a 36 y.o. female with history of substance abuse who presents emergency department with complaints of fall. Reports that she tripped over a new puppy that she has and fell forward striking her head on the ground. He is left-hand dominant complaining of left clavicular pain. Does have neck and back pain but states she thinks this is old. No new numbness, tingling or focal weakness. No chest pain, abdominal pain. She does endorse draining alcohol tonight. Denies drug use  ROS: See HPI Constitutional: no fever  Eyes: no drainage  ENT: no runny nose   Cardiovascular:  no chest pain  Resp: no SOB  GI: no vomiting GU: no dysuria Integumentary: no rash  Allergy: no hives  Musculoskeletal: no leg swelling  Neurological: no slurred speech ROS otherwise negative  PAST MEDICAL HISTORY/PAST SURGICAL HISTORY:  Past Medical History:  Diagnosis Date  . AKI (acute kidney injury) ([place]) 01/18/2015  . Allergy   . Anxiety   . Asthma    exacerbated by bronchitisi  . Asthma   . C3 cervical fracture ([place])   . Cervical transverse process fracture ([place])   . Chronic left shoulder pain 08/2012  . Clavicle fracture   . Concussion 01/29/2016  . Depression   . 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 Dysmenorrhea   . Dysphagia   . Finger fracture, left 10/31/2012   LEFT 4th finger  . Insomnia   . Lumbar transverse process fracture ([place])   . Migraine without aura   . Mood disorder ([place])   . Nephrolithiasis   . PMDD (premenstrual dysphoric disorder)   . Respiratory failure ([place])   . Seizure-like activity ([place]) 06/30/2016   normal EEG, question pseudoseizures  . T12 burst fracture ([place])   . Traumatic hemo-pneumothorax   . Traumatic hemopneumothorax 02/09/2016    MEDICATIONS:  Prior to Admission medications   Medication Sig Start Date End Date Taking? Authorizing Provider  albuterol (PROVENTIL HFA;VENTOLIN HFA) 108 (90  Base) MCG/ACT inhaler Inhale 2 puffs into the lungs every 4 (four) hours as needed for wheezing or shortness of breath (cough, shortness of breath or wheezing.). 08/12/16   [name] Jeffery, PA-C  cyclobenzaprine (FLEXERIL) 10 MG tablet Take 10 mg by mouth daily as needed. 06/03/16   Historical Provider, MD  ibuprofen (ADVIL,MOTRIN) 800 MG tablet Take 800 mg by mouth every 8 (eight) hours as needed for mild pain or moderate pain.    Historical Provider, MD  levonorgestrel (MIRENA) 20 MCG/24HR IUD 1 each by Intrauterine route once.    Historical Provider, MD  levothyroxine (SYNTHROID, LEVOTHROID) 50 MCG tablet Take 1 tablet (50 mcg total) by mouth daily 09/07/15   [name] Jeffery, PA-C  naproxen (NAPROSYN) 500 MG tablet Take 1 tablet (500 mg total) by mouth 2 (two) times daily. 08/21/16   Merryl Hacker, MD  nicotine (NICODERM CQ - DOSED IN MG/24 HOURS) 21 mg/24hr patch Place 1 patch (21 mg total) onto the skin daily at 6 (six) AM. Patient not taking: Reported on 07/08/2016 07/02/16   Derrill Center, NP  Phenyleph-CPM-DM-Aspirin (ALKA-SELTZER PLUS COLD & COUGH) 7.04-10-09-325 MG TBEF Take 1 packet by mouth daily as needed (for cold symptoms).    Historical Provider, MD  pregabalin (LYRICA) 200 MG capsule Take 1 capsule (200 mg total) by mouth 3 (three) times daily. 01/18/16   [name] Jeffery, PA-C  traMADol (ULTRAM) 50 MG tablet Take 1 tablet (50 mg total) by  mouth every 6 (six) hours as needed. 08/21/16   Merryl Hacker, MD  traZODone (DESYREL) 50 MG tablet Take 1 tablet (50 mg total) by mouth at bedtime as needed for sleep. Patient not taking: Reported on 07/08/2016 07/01/16   Derrill Center, NP  zolpidem (AMBIEN) 10 MG tablet Take 5-10 mg by mouth at bedtime as needed for sleep.    Historical Provider, MD    ALLERGIES:  Allergies  Allergen Reactions  . Penicillins Other (See Comments)    Unknown Has patient had a PCN reaction causing immediate rash, facial/tongue/throat swelling, SOB or  lightheadedness with hypotension:  unknown Has patient had a PCN reaction causing severe rash involving mucus membranes or skin necrosis:  unknown Has patient had a PCN reaction that required hospitalization  unknown Has patient had a PCN reaction occurring within the last 10 years: unknown If all of the above answers are "NO", the  . Progestins     Extreme moodiness  . Progestins Other (See Comments)    Extreme moodiness  . Sinequan [Doxepin] Other (See Comments) and Hypertension    Hallucinations, delusions, severe agitation  . Sinequan [Doxepin] Other (See Comments)    hallucinations    SOCIAL HISTORY:  Social History  Substance Use Topics  . Smoking status: Current Every Day Smoker    Packs/day: 1.00    Types: Cigarettes  . Smokeless tobacco: Never Used  . Alcohol use 0.0 oz/week     Comment: occasionally    FAMILY HISTORY: Family History  Problem Relation Age of Onset  . Migraines Mother   . Mental illness Mother   . Heart disease Father   . Alcohol abuse Father   . Arthritis Maternal Grandmother   . Thyroid disease Maternal Grandmother   . Heart disease Maternal Grandfather     AMI 1996, 2014  . Anemia Maternal Grandfather     bone marrow dysfunction    EXAM: BP 100/70 (BP Location: Left Arm)   Pulse 84   Temp 98.2 F (36.8 C) (Oral)   Resp 18   Ht 5\' 9"  (1.753 m)   Wt 170 lb (77.1 kg)   SpO2 100%   BMI 25.10 kg/m  CONSTITUTIONAL: Alert and oriented and responds appropriately to questions. Well-appearing; well-nourished; GCS 15, Appears drowsy but arousable, smells of alcohol but does not appear significantly intoxicated HEAD: Normocephalic; small left frontal hematoma on the forehead EYES: Conjunctivae clear, PERRL, EOMI ENT: normal nose; no rhinorrhea; moist mucous membranes; pharynx without lesions noted; no dental injury; no septal hematoma, small amount of dried blood noted on the external auditory canal without hemotympanum, bleeding lacerations. No  Battle sign or raccoon eyes. No otorrhea. NECK: Supple, no meningismus, no LAD; no midline spinal tenderness, step-off or deformity; trachea midline CARD: RRR; S1 and S2 appreciated; no murmurs, no clicks, no rubs, no gallops RESP: Normal chest excursion without splinting or tachypnea; breath sounds clear and equal bilaterally; no wheezes, no rhonchi, no rales; no hypoxia or respiratory distress CHEST:  chest wall stable, no crepitus or ecchymosis or deformity, nontender to palpation; no flail chest ABD/GI: Normal bowel sounds; non-distended; soft, non-tender, no rebound, no guarding; no ecchymosis or other lesions noted PELVIS:  stable, nontender to palpation BACK:  The back appears normal, there is no CVA tenderness; no midline spinal step-off or deformity, does have some mild tenderness over the lower thoracic and upper lumbar spine EXT: Tender over the left clavicle without obvious deformity. Normal ROM in all joints; otherwise external these  are non-tender to palpation; no edema; normal capillary refill; no cyanosis, no bony tenderness or bony deformity of patient's extremities, no joint effusion, compartments are soft, extremities are warm and well-perfused, no ecchymosis or lacerations, 2+ radial and DP pulses bilaterally    SKIN: Normal color for age and race; warm NEURO: Moves all extremities equally, sensation to light touch intact diffusely, cranial nerves II through XII intact, normal gait PSYCH: The patient's mood and manner are appropriate. Grooming and personal hygiene are appropriate.  MEDICAL DECISION MAKING: Patient here with mechanical fall. Does report alcohol use tonight. Does not appear significantly intoxicated however. Hemodynamically stable and neurologically intact. We'll obtain CT imaging of her head, cervical spine, x-rays of her thoracic and lumbar spine x-rays of her left clavicle.  ED PROGRESS: 5:00 AM  Patient's imaging shows no acute abnormality. There is an old T12  vertebral fracture but no new abnormality. Patient is still comfortable. I feel she is safe to be discharged home with her significant other. Discussed an injury return precautions. Recommended alternating Tylenol and Motrin as needed.  At this time, I do not feel there is any life-threatening condition present. I have reviewed and discussed all results (EKG, imaging, lab, urine as appropriate) and exam findings with patient/family. I have reviewed nursing notes and appropriate previous records.  I feel the patient is safe to be discharged home without further emergent workup and can continue workup as an outpatient as needed. Discussed usual and customary return precautions. Patient/family verbalize understanding and are comfortable with this plan.  Outpatient follow-up has been provided. All questions have been answered.      [place], DO 09/20/16 [phone]
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
## 2016-09-23 ENCOUNTER — Encounter: Payer: Self-pay | Admitting: Physician Assistant

## 2016-09-24 ENCOUNTER — Ambulatory Visit (HOSPITAL_COMMUNITY): Payer: BLUE CROSS/BLUE SHIELD | Admitting: Physical Therapy

## 2016-09-26 ENCOUNTER — Encounter (HOSPITAL_COMMUNITY): Payer: Self-pay | Admitting: Physical Therapy

## 2016-09-26 ENCOUNTER — Encounter (HOSPITAL_COMMUNITY): Payer: Self-pay

## 2016-09-26 MED ORDER — GABAPENTIN 300 MG PO CAPS: 300.0000 mg | ORAL_CAPSULE | Freq: Three times a day (TID) | ORAL | 3 refills | Status: DC

## 2016-10-01 ENCOUNTER — Ambulatory Visit (HOSPITAL_COMMUNITY): Payer: BLUE CROSS/BLUE SHIELD | Admitting: Physical Therapy

## 2016-10-01 ENCOUNTER — Encounter (HOSPITAL_COMMUNITY): Payer: Self-pay | Admitting: Physical Therapy

## 2016-10-01 ENCOUNTER — Telehealth (HOSPITAL_COMMUNITY): Payer: Self-pay | Admitting: Physician Assistant

## 2016-10-01 NOTE — Telephone Encounter (Signed)
10/01/16 cx because she got called in to work, patient left a message

## 2016-10-03 ENCOUNTER — Encounter (HOSPITAL_COMMUNITY): Payer: Self-pay

## 2016-10-04 ENCOUNTER — Ambulatory Visit (HOSPITAL_COMMUNITY): Payer: BLUE CROSS/BLUE SHIELD

## 2016-10-04 ENCOUNTER — Telehealth (HOSPITAL_COMMUNITY): Payer: Self-pay

## 2016-10-04 NOTE — Telephone Encounter (Signed)
No show, called and left message concerning missed apt and wished to discuss compliance wiht therapy.  Included next apt date and time with contact info given.    [address] [place]; [facility] [phone]

## 2016-10-07 ENCOUNTER — Telehealth: Payer: Self-pay

## 2016-10-07 NOTE — Telephone Encounter (Signed)
Request sent to [facility] for pt records, which are needed to refer pt to new pain management clinic.

## 2016-10-08 ENCOUNTER — Encounter (HOSPITAL_COMMUNITY): Payer: Self-pay | Admitting: Physical Therapy

## 2016-10-08 ENCOUNTER — Ambulatory Visit (HOSPITAL_COMMUNITY): Payer: BLUE CROSS/BLUE SHIELD | Admitting: Physical Therapy

## 2016-10-08 ENCOUNTER — Telehealth (HOSPITAL_COMMUNITY): Payer: Self-pay | Admitting: Physical Therapy

## 2016-10-08 NOTE — Telephone Encounter (Signed)
Pt did not show for appointment (2nd NS) and has not returned since her initial evaluation.  Left message on VM regarding missed appointment and the need to call and make another appointment if she wished to continue.  Teena Irani, PTA/CLT [phone]

## 2016-10-18 ENCOUNTER — Encounter: Payer: Self-pay | Admitting: Physician Assistant

## 2016-10-18 MED ORDER — GABAPENTIN 400 MG PO CAPS: 800.0000 mg | ORAL_CAPSULE | Freq: Three times a day (TID) | ORAL | 3 refills | Status: DC

## 2016-11-03 ENCOUNTER — Other Ambulatory Visit: Payer: Self-pay | Admitting: Physician Assistant

## 2016-11-21 ENCOUNTER — Encounter: Payer: Self-pay | Admitting: Physician Assistant

## 2016-11-27 MED ORDER — CELECOXIB 200 MG PO CAPS: 200.0000 mg | ORAL_CAPSULE | Freq: Two times a day (BID) | ORAL | 3 refills | Status: DC

## 2017-01-07 ENCOUNTER — Ambulatory Visit: Payer: BLUE CROSS/BLUE SHIELD | Admitting: Physician Assistant

## 2017-01-09 ENCOUNTER — Ambulatory Visit: Payer: BLUE CROSS/BLUE SHIELD | Admitting: Physician Assistant

## 2017-01-13 ENCOUNTER — Ambulatory Visit (INDEPENDENT_AMBULATORY_CARE_PROVIDER_SITE_OTHER): Payer: BLUE CROSS/BLUE SHIELD | Admitting: Physician Assistant

## 2017-01-13 ENCOUNTER — Encounter: Payer: Self-pay | Admitting: Physician Assistant

## 2017-01-13 VITALS — BP 123/81 | HR 99 | Temp 98.2°F | Resp 16 | Ht 69.0 in | Wt 186.0 lb

## 2017-01-13 DIAGNOSIS — R45851 Suicidal ideations: Secondary | ICD-10-CM

## 2017-01-13 DIAGNOSIS — F3281 Premenstrual dysphoric disorder: Secondary | ICD-10-CM

## 2017-01-13 DIAGNOSIS — M549 Dorsalgia, unspecified: Secondary | ICD-10-CM

## 2017-01-13 DIAGNOSIS — N946 Dysmenorrhea, unspecified: Secondary | ICD-10-CM | POA: Diagnosis not present

## 2017-01-13 DIAGNOSIS — G894 Chronic pain syndrome: Secondary | ICD-10-CM

## 2017-01-13 DIAGNOSIS — G8929 Other chronic pain: Secondary | ICD-10-CM | POA: Insufficient documentation

## 2017-01-13 DIAGNOSIS — R569 Unspecified convulsions: Secondary | ICD-10-CM

## 2017-01-13 DIAGNOSIS — E039 Hypothyroidism, unspecified: Secondary | ICD-10-CM | POA: Diagnosis not present

## 2017-01-13 DIAGNOSIS — F5104 Psychophysiologic insomnia: Secondary | ICD-10-CM | POA: Diagnosis not present

## 2017-01-13 DIAGNOSIS — E876 Hypokalemia: Secondary | ICD-10-CM | POA: Diagnosis not present

## 2017-01-13 MED ORDER — TIZANIDINE HCL 4 MG PO TABS: 8.0000 mg | ORAL_TABLET | Freq: Three times a day (TID) | ORAL | 0 refills | Status: DC

## 2017-01-13 MED ORDER — IBUPROFEN 800 MG PO TABS: 800.0000 mg | ORAL_TABLET | Freq: Three times a day (TID) | ORAL | 0 refills | Status: DC | PRN

## 2017-01-13 NOTE — Patient Instructions (Signed)
     IF you received an x-ray today, you will receive an invoice from [facility]. Please contact [facility] at [phone] with questions or concerns regarding your invoice.   IF you received labwork today, you will receive an invoice from [facility]. Please contact [facility] at [phone] with questions or concerns regarding your invoice.   Our billing staff will not be able to assist you with questions regarding bills from these companies.  You will be contacted with the lab results as soon as they are available. The fastest way to get your results is to activate your My Chart account. Instructions are located on the last page of this paperwork. If you have not heard from us regarding the results in 2 weeks, please contact this office.     

## 2017-01-13 NOTE — Addendum Note (Signed)
Addended by: Fara Chute on: 01/13/2017 07:11 PM   Modules accepted: Orders

## 2017-01-13 NOTE — Assessment & Plan Note (Signed)
Continues to desire TAH. Wants to follow-up with GYN, but needs to get other issues under better control first. Continue follow-up with psychiatry.

## 2017-01-13 NOTE — Assessment & Plan Note (Signed)
Now also aggravated by her pain. Refer to pain management. Continue with psychiatry. Neurology may provide additional insight.
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
## 2017-01-13 NOTE — Assessment & Plan Note (Signed)
Refer to pain management again. She is not currently on opiates at all, but her pain is debilitating.

## 2017-01-13 NOTE — Assessment & Plan Note (Signed)
Update labs today and adjust regimen if indicated.

## 2017-01-13 NOTE — Assessment & Plan Note (Signed)
Continues to desire TAH. Wants to follow-up with GYN, but needs to get other issues under better control first.

## 2017-01-13 NOTE — Assessment & Plan Note (Signed)
No plan or intention, encouraged follow-up with psychiatry.

## 2017-01-13 NOTE — Assessment & Plan Note (Addendum)
This was noted during hospitalization.Likely resolved.  Update labs today.

## 2017-01-13 NOTE — Assessment & Plan Note (Signed)
This may have been psychogenic, or vasovagal tonic-clonic movements. Ask neurology to evaluate.

## 2017-01-14 ENCOUNTER — Telehealth: Payer: Self-pay | Admitting: Physician Assistant

## 2017-01-14 ENCOUNTER — Encounter: Payer: Self-pay | Admitting: Neurology

## 2017-01-14 ENCOUNTER — Encounter: Payer: Self-pay | Admitting: Physician Assistant

## 2017-01-14 LAB — CBC WITH DIFFERENTIAL/PLATELET
Basophils Absolute: 0 x10E3/uL (ref 0.0–0.2)
Basos: 0 %
EOS (ABSOLUTE): 0.2 x10E3/uL (ref 0.0–0.4)
Eos: 2 %
Hematocrit: 45.4 % (ref 34.0–46.6)
Hemoglobin: 14.9 g/dL (ref 11.1–15.9)
Immature Grans (Abs): 0 x10E3/uL (ref 0.0–0.1)
Immature Granulocytes: 0 %
Lymphocytes Absolute: 2.1 x10E3/uL (ref 0.7–3.1)
Lymphs: 27 %
MCH: 30 pg (ref 26.6–33.0)
MCHC: 32.8 g/dL (ref 31.5–35.7)
MCV: 91 fL (ref 79–97)
Monocytes Absolute: 0.4 x10E3/uL (ref 0.1–0.9)
Monocytes: 5 %
Neutrophils Absolute: 5 x10E3/uL (ref 1.4–7.0)
Neutrophils: 66 %
Platelets: 223 x10E3/uL (ref 150–379)
RBC: 4.97 x10E6/uL (ref 3.77–5.28)
RDW: 15 % (ref 12.3–15.4)
WBC: 7.7 x10E3/uL (ref 3.4–10.8)

## 2017-01-14 LAB — T4, FREE: Free T4: 0.95 ng/dL (ref 0.82–1.77)

## 2017-01-14 LAB — COMPREHENSIVE METABOLIC PANEL WITH GFR
ALT: 12 IU/L (ref 0–32)
AST: 12 IU/L (ref 0–40)
Albumin/Globulin Ratio: 1.4 (ref 1.2–2.2)
Albumin: 4.3 g/dL (ref 3.5–5.5)
Alkaline Phosphatase: 86 IU/L (ref 39–117)
BUN/Creatinine Ratio: 16 (ref 9–23)
BUN: 9 mg/dL (ref 6–20)
Bilirubin Total: 0.3 mg/dL (ref 0.0–1.2)
CO2: 21 mmol/L (ref 18–29)
Calcium: 9 mg/dL (ref 8.7–10.2)
Chloride: 101 mmol/L (ref 96–106)
Creatinine, Ser: 0.57 mg/dL (ref 0.57–1.00)
GFR calc Af Amer: 139 mL/min/1.73 (ref >59)
GFR calc non Af Amer: 120 mL/min/1.73 (ref >59)
Globulin, Total: 3.1 g/dL (ref 1.5–4.5)
Glucose: 86 mg/dL (ref 65–99)
Potassium: 4.1 mmol/L (ref 3.5–5.2)
Sodium: 142 mmol/L (ref 134–144)
Total Protein: 7.4 g/dL (ref 6.0–8.5)

## 2017-01-14 LAB — TSH: TSH: 0.934 u[IU]/mL (ref 0.450–4.500)

## 2017-01-14 NOTE — Telephone Encounter (Signed)
Letter re-written and printed/signed at 104. Will bring to 102 after clinic and put in FMLA/Disability box.

## 2017-01-14 NOTE — Telephone Encounter (Signed)
Patient needs the letter that [name] wrote for her on 01/13/17 revised and re faxed to [facility].  She states it needs to say that her disability should be good from 12/28/16 to 03/23/17. Can we get this fixed and faxed to [employer] at [phone] with Case number [id number]  Thank you

## 2017-01-23 ENCOUNTER — Encounter: Payer: Self-pay | Admitting: Physician Assistant

## 2017-01-28 IMAGING — CR DG CLAVICLE*L*
3 series · 3 of 3 positions shown · non-contrast
Comparison: None.

CLINICAL DATA: Known left clavicular fracture

EXAM:
LEFT CLAVICLE - 2+ VIEWS

[clavicle ap]
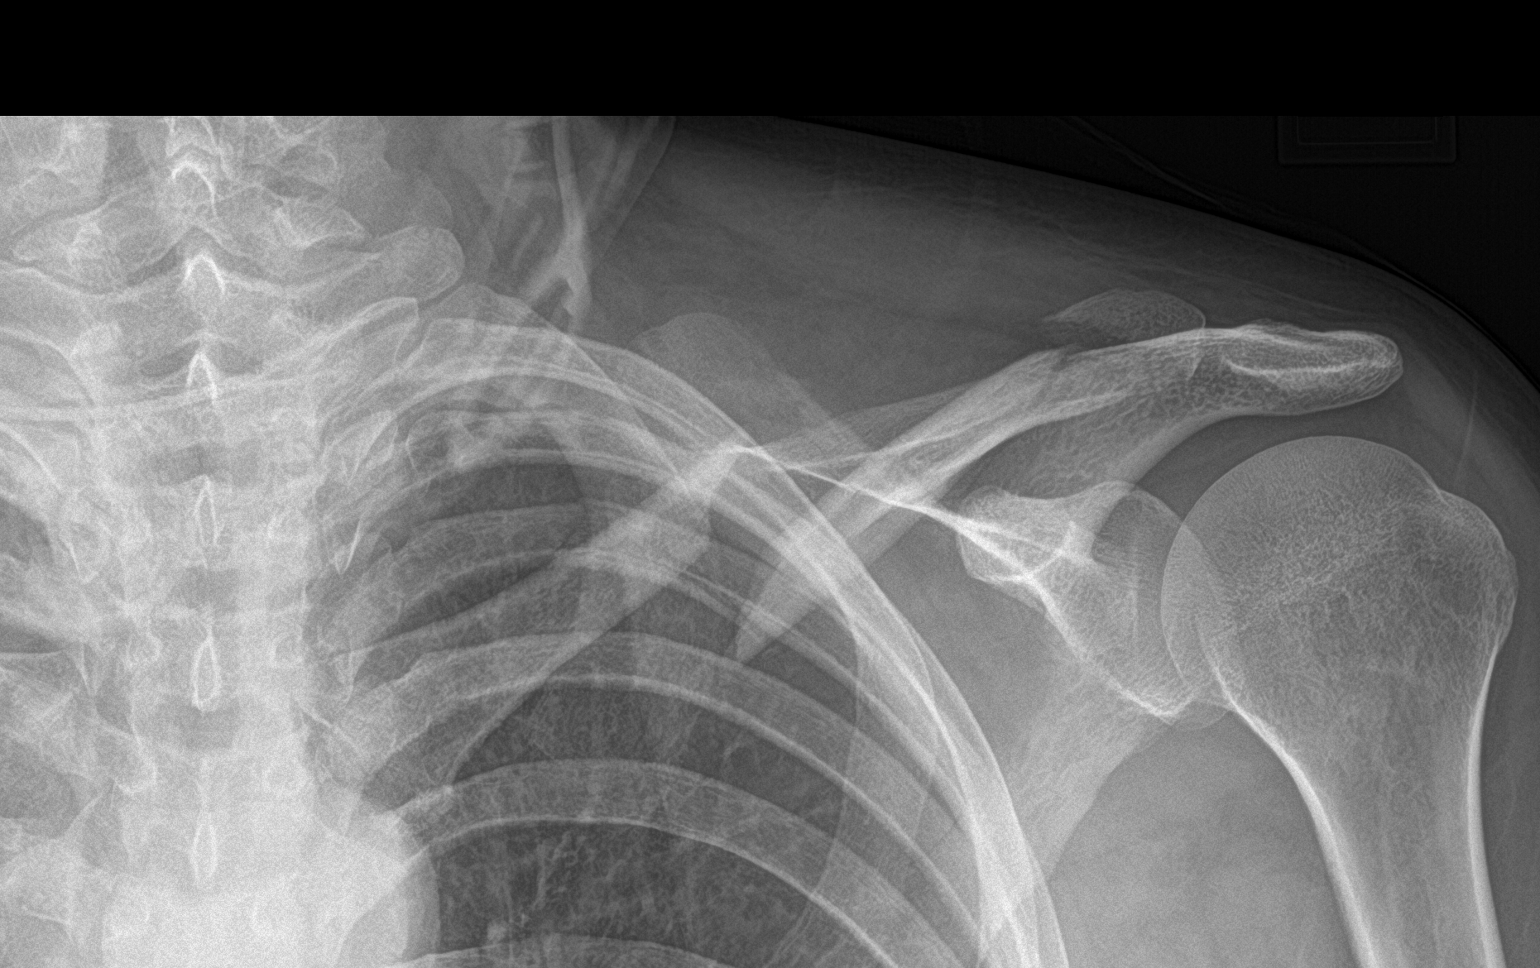

[clavicle axial (1 of 2)]
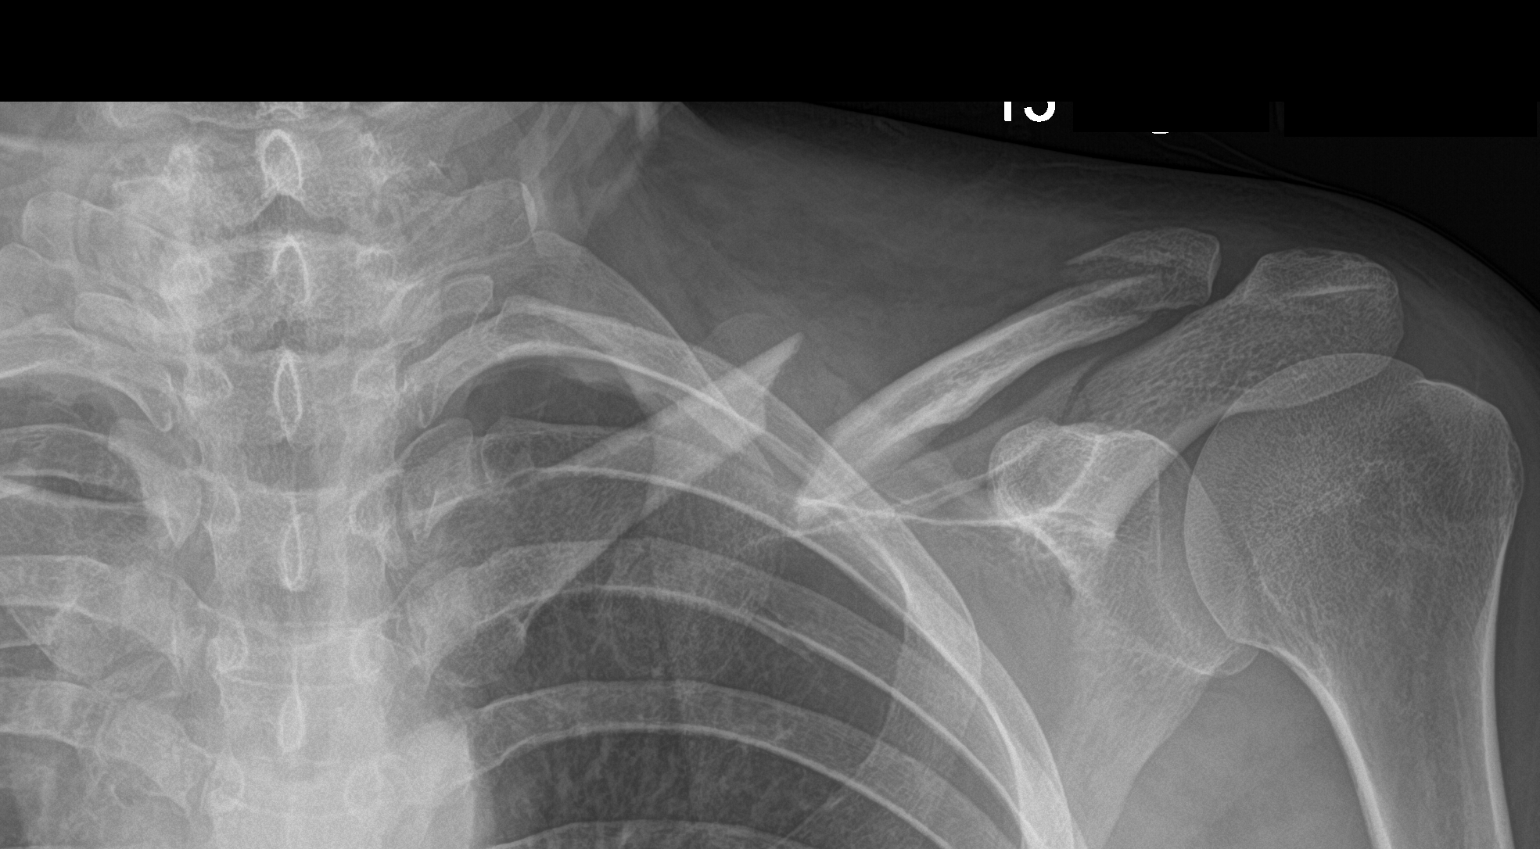

[clavicle axial (2 of 2)]
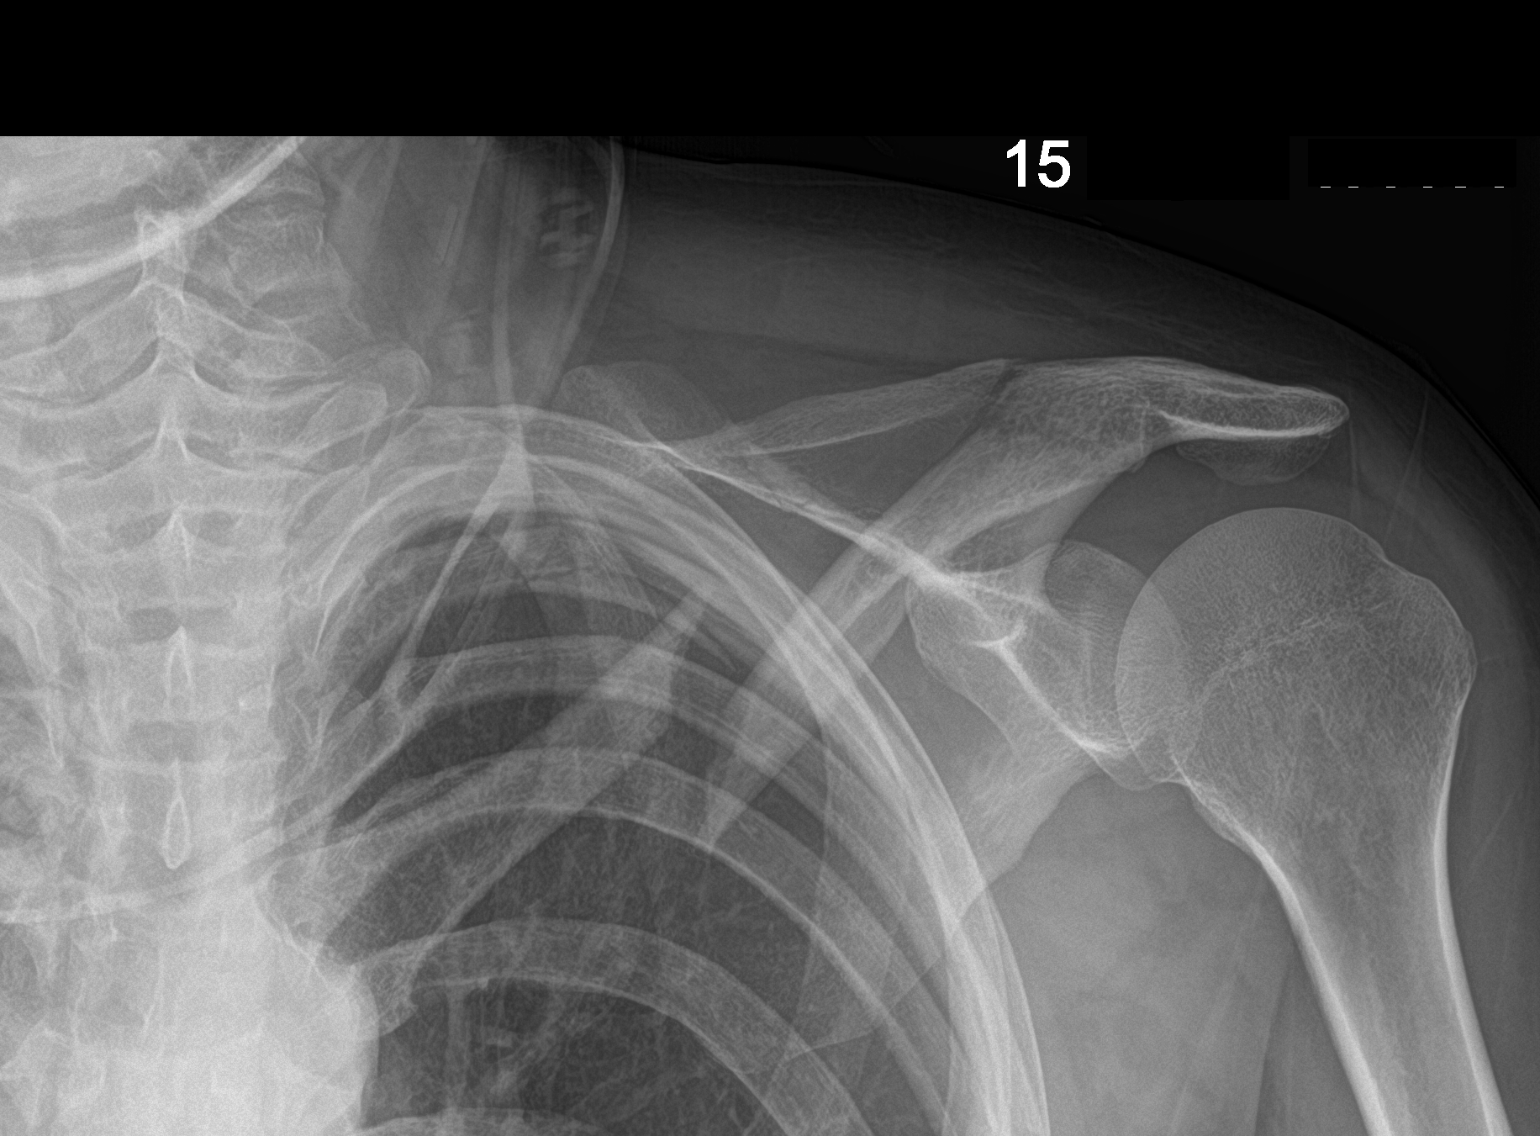

[3 of 3 positions shown; findings below may reference images not displayed]

FINDINGS: There is a displaced fracture of the midclavicle. There is a
comminuted fracture through the distal clavicle as well. The AC
joint does not appear to be widened. There is a lucency near the
base of the acromion on the 15 degree cephalad and 15 degree caudal
views consistent with scapular fracture is well. No other
abnormalities.
IMPRESSION: Fractures through the mid clavicle, distal clavicle, and scapula at
the base of the acromion. The clavicular fractures are displaced,
particularly the mid clavicular fracture.

## 2017-01-30 ENCOUNTER — Telehealth: Payer: Self-pay | Admitting: Physician Assistant

## 2017-01-30 NOTE — Telephone Encounter (Signed)
Pt left vm on referrals phone to call back. Tried calling pt and could not leave vm because vm full.

## 2017-02-07 ENCOUNTER — Other Ambulatory Visit: Payer: Self-pay | Admitting: Physician Assistant

## 2017-02-09 ENCOUNTER — Other Ambulatory Visit: Payer: Self-pay | Admitting: Physician Assistant

## 2017-02-09 DIAGNOSIS — G894 Chronic pain syndrome: Secondary | ICD-10-CM

## 2017-02-10 ENCOUNTER — Encounter: Payer: Self-pay | Admitting: Physician Assistant

## 2017-02-10 NOTE — Telephone Encounter (Signed)
01/13/17 Last ov and refill

## 2017-02-14 ENCOUNTER — Ambulatory Visit: Payer: BLUE CROSS/BLUE SHIELD | Admitting: Physician Assistant

## 2017-02-17 ENCOUNTER — Ambulatory Visit (INDEPENDENT_AMBULATORY_CARE_PROVIDER_SITE_OTHER): Payer: BLUE CROSS/BLUE SHIELD | Admitting: Physician Assistant

## 2017-02-17 ENCOUNTER — Encounter: Payer: Self-pay | Admitting: Physician Assistant

## 2017-02-17 VITALS — BP 99/68 | HR 85 | Temp 98.8°F

## 2017-02-17 DIAGNOSIS — G894 Chronic pain syndrome: Secondary | ICD-10-CM | POA: Diagnosis not present

## 2017-02-17 DIAGNOSIS — F1920 Other psychoactive substance dependence, uncomplicated: Secondary | ICD-10-CM

## 2017-02-17 DIAGNOSIS — J309 Allergic rhinitis, unspecified: Secondary | ICD-10-CM

## 2017-02-17 DIAGNOSIS — R569 Unspecified convulsions: Secondary | ICD-10-CM

## 2017-02-17 DIAGNOSIS — F332 Major depressive disorder, recurrent severe without psychotic features: Secondary | ICD-10-CM | POA: Diagnosis not present

## 2017-02-17 MED ORDER — TIZANIDINE HCL 4 MG PO TABS: 12.0000 mg | ORAL_TABLET | Freq: Three times a day (TID) | ORAL | 0 refills | Status: DC

## 2017-02-17 MED ORDER — ALBUTEROL SULFATE HFA 108 (90 BASE) MCG/ACT IN AERS: INHALATION_SPRAY | RESPIRATORY_TRACT | 1 refills | Status: DC

## 2017-02-17 MED ORDER — PREGABALIN 100 MG PO CAPS: ORAL_CAPSULE | ORAL | 0 refills | Status: DC

## 2017-02-17 NOTE — Assessment & Plan Note (Signed)
None since last hospital admission. Continues on depakote as mood stabilizer.

## 2017-02-17 NOTE — Assessment & Plan Note (Signed)
Continue follow-up and treatment per psychiatry.

## 2017-02-17 NOTE — Assessment & Plan Note (Addendum)
Continue working to get her in pain management. Increase tizanidine over the next 2 weeks. Re-try pregabalin. Start with 100 mg BID, increase to 200 mg BID in 1 week.

## 2017-02-17 NOTE — Progress Notes (Signed)
Patient ID: Barbara Rogers, female    DOB: 22-Feb-1981, 36 y.o.   MRN: [id number]  PCP: Harrison Mons, PA-C  Chief Complaint  Patient presents with  . Follow-up    disability    Subjective:   Presents for evaluation of current disability.  She is not currently able to work due to pain in the back and shoulder. These are chronic issues. Prior to the injuries which cause her LBP, and ongoing, she has severe dysmenorrhea. Due to the financial strain caused by her inability to work, she has also not been able to schedule a uterine ablation vs hysterectomy, long her goal to address the severe symptoms associated with her periods.  LBP, all the time. Shooting pain with each step. When getting up in the morning, the RIGHT leg feels like it may give way. Simple tasks take 4 times the amount of energy they should. Lying down to sleep, she has pain in the neck and LEFT collar bone. She continues to try to stay active and perform HEP provided by PT.  Ibuprofen and celecoxib have not helped. "I'm almost willing to try that Lyrica or gabapentin." "I've had burning pain in the past that they Lyrica did help." "I've had 10-15 lidocaine shots in my back and didn't touch it."  "I'm just really, really, really tired." "It's hard to keep a positive attitude when you have pain at a 7 all the time." "My hygiene is terrible."  Lost her appetite last week. Got it back yesterday and ate a little. Awoke this AM with nausea and vomiting. "Tasted like burnt popcorn."  We continue to wait on referral to pain management. We had to get the notes from the previous clinic to send to the new one before they would schedule her. "They're screwing me around. They faxed my records to the wrong place." "They seem to be in no hurry whatsoever." We did finally receive the records and have forwarded. "I'm still looking at about another month."  Her relationship is suffering due to her chronic pain, and  worsening mood/depression as a result of pain, which is also contributing to insomnia. In turn, each of these issues aggravates the others. Gaspar Bidding, her fiance, also deals with chronic pain and works in Biomedical scientist. She is worried that he will end their relationship due to her mood, and because she isn't able to contribute to the upkeep of their home due to the pain.   Review of Systems As above. No CP, SOB, dizziness. No diarrhea, constipation. No new muscle or joint pain. Other than today, no nausea/vomiting. No urinary symptoms.    Patient Active Problem List   Diagnosis Date Noted  . Chronic pain syndrome 01/13/2017  . Polysubstance (excluding opioids) dependence, daily use ([place]) 07/01/2016  . Seizure-like activity ([place]) 06/29/2016  . MDD (major depressive disorder), recurrent severe, without psychosis ([place]) 06/27/2016  . Suicidal ideation 06/26/2016  . Bradycardia 06/26/2016  . Tobacco abuse   . Hypokalemia 01/18/2015  . Migraine without aura   . IUD (intrauterine device) in place 06/02/2013  . Hypothyroid 12/09/2012  . Chronic left shoulder pain   . Mood disorder ([place]) 03/25/2012  . AR (allergic rhinitis) 03/25/2012  . Dysmenorrhea 03/25/2012  . PMDD (premenstrual dysphoric disorder) 03/25/2012  . Chronic insomnia 03/25/2012     Prior to Admission medications   Medication Sig Start Date End Date Taking? Authorizing Provider  ALPRAZolam Duanne Moron) 1 MG tablet Take 1 mg by mouth 4 (four) times daily.  Yes [provider]  amphetamine-dextroamphetamine (ADDERALL) 20 MG tablet Take 20 mg by mouth 3 (three) times daily.   Yes [provider]  Asenapine Maleate 10 MG SUBL Place 20 mg under the tongue at bedtime and may repeat dose one time if needed.   Yes [provider]  divalproex (DEPAKOTE ER) 500 MG 24 hr tablet Take 1,000 mg by mouth at bedtime.   Yes [provider]  ibuprofen (ADVIL,MOTRIN) 800 MG tablet Take 1 tablet (800 mg total) by  mouth every 8 (eight) hours as needed. 01/13/17  Yes Stephanine Reas, Domingo Mend, PA-C  levonorgestrel (MIRENA) 20 MCG/24HR IUD 1 each by Intrauterine route once.   Yes [provider]  levothyroxine (SYNTHROID, LEVOTHROID) 50 MCG tablet TAKE 1 TABLET BY MOUTH EVERY DAY 11/04/16  Yes Emberly Tomasso, PA-C  lidocaine (LIDODERM) 5 %  11/16/16  Yes [provider]  omeprazole (PRILOSEC) 20 MG capsule  11/09/16  Yes [provider]  ondansetron (ZOFRAN) 8 MG tablet  11/11/16  Yes [provider]  tiZANidine (ZANAFLEX) 4 MG tablet TAKE 2 TABLETS BY MOUTH 3 TIMES A DAY 02/11/17  Yes Sukhman Martine, PA-C  traZODone (DESYREL) 150 MG tablet  10/16/16  Yes [provider]  traZODone (DESYREL) 50 MG tablet Take 1 tablet (50 mg total) by mouth at bedtime as needed for sleep. 07/01/16  Yes Derrill Center, NP  VENTOLIN HFA 108 (90 Base) MCG/ACT inhaler TAKE 2 PUFFS EVERY 4 HOURS AS NEEDED 02/08/17  Yes Feather Berrie, PA-C  zolpidem (AMBIEN) 10 MG tablet Take 5-10 mg by mouth at bedtime as needed for sleep.   Yes [provider]     Allergies  Allergen Reactions  . Penicillins Other (See Comments)    Unknown Has patient had a PCN reaction causing immediate rash, facial/tongue/throat swelling, SOB or lightheadedness with hypotension:  unknown Has patient had a PCN reaction causing severe rash involving mucus membranes or skin necrosis:  unknown Has patient had a PCN reaction that required hospitalization  unknown Has patient had a PCN reaction occurring within the last 10 years: unknown If all of the above answers are "NO", the  . Progestins     Extreme moodiness  . Progestins Other (See Comments)    Extreme moodiness  . Sinequan [Doxepin] Other (See Comments) and Hypertension    Hallucinations, delusions, severe agitation  . Sinequan [Doxepin] Other (See Comments)    hallucinations       Objective:  Physical Exam  Constitutional: She is oriented to person, place,  and time. She appears well-developed and well-nourished. She is active and cooperative. No distress.  BP 99/68 (BP Location: Right Arm, Patient Position: Sitting, Cuff Size: Normal)   Pulse 85   Temp 98.8 F (37.1 C) (Oral)   HENT:  Head: Normocephalic and atraumatic.  Right Ear: Hearing normal.  Left Ear: Hearing normal.  Eyes: Conjunctivae are normal. No scleral icterus.  Neck: Normal range of motion. Neck supple. No thyromegaly present.  Cardiovascular: Normal rate, regular rhythm and normal heart sounds.   Pulses:      Radial pulses are 2+ on the right side, and 2+ on the left side.  Pulmonary/Chest: Effort normal and breath sounds normal.  Lymphadenopathy:       Head (right side): No tonsillar, no preauricular, no posterior auricular and no occipital adenopathy present.       Head (left side): No tonsillar, no preauricular, no posterior auricular and no occipital adenopathy present.    She  has no cervical adenopathy.       Right: No supraclavicular adenopathy present.       Left: No supraclavicular adenopathy present.  Neurological: She is alert and oriented to person, place, and time. No sensory deficit.  Skin: Skin is warm, dry and intact. No rash noted. No cyanosis or erythema. Nails show no clubbing.  Psychiatric: Her speech is normal and behavior is normal. Judgment and thought content normal. Her mood appears not anxious. Her affect is blunt. Her affect is not angry, not labile and not inappropriate. Cognition and memory are normal. She exhibits a depressed mood.           Assessment & Plan:   Problem List Items Addressed This Visit    AR (allergic rhinitis) (Chronic)   Relevant Medications   albuterol (VENTOLIN HFA) 108 (90 Base) MCG/ACT inhaler   Polysubstance (excluding opioids) dependence, daily use ([facility]) (Chronic)    No longer on opiates. Currently on benzo and stimulant from psychiatry.      MDD (major depressive disorder), recurrent severe, without  psychosis ([place])    Continue follow-up and treatment per psychiatry.      Seizure-like activity ([place])    None since last hospital admission. Continues on depakote as mood stabilizer.      Chronic pain syndrome - Primary    Continue working to get her in pain management. Increase tizanidine over the next 2 weeks. Re-try pregabalin. Start with 100 mg BID, increase to 200 mg BID in 1 week.      Relevant Medications   pregabalin (LYRICA) 100 MG capsule   tiZANidine (ZANAFLEX) 4 MG tablet       Return in about 3 months (around 05/20/2017), or if symptoms worsen or fail to improve, for re-evaluation.   Fara Chute, PA-C [facility]

## 2017-02-17 NOTE — Patient Instructions (Addendum)
When you are increasing the Zanaflex (tizanadine), add a 3rd tablet to the evening dose for 4 days, then to the mid-day dose x 4 days, then to the morning dose.   We recommend that you schedule a mammogram for breast cancer screening. Typically, you do not need a referral to do this. Please contact a local imaging center to schedule your mammogram.  [facility] - [phone]  *ask for the Radiology Department [facility] ([place]) - [phone] or [phone]  [facility] - [phone] [place] [phone] [facility] - [phone]  *ask for the [facility] - [phone]  *ask for the Radiology Department [facility] - [phone]  *ask for the [facility] - [phone]  IF you received an x-ray today, you will receive an invoice from [facility]. Please contact [facility] Rad[facility] at [phone] with questions or concerns regarding your invoice.   IF you received labwork today, you will receive an invoice from [facility]. Please contact [facility] at [phone] with questions or concerns regarding your invoice.   Our billing staff will not be able to assist you with questions regarding bills from these companies.  You will be contacted with the lab results as soon as they are available. The fastest way to get your results is to activate your My Chart account. Instructions are located on the last page of this paperwork. If you have not heard from [place] regarding the results in 2 weeks, please contact this office.

## 2017-02-17 NOTE — Assessment & Plan Note (Signed)
No longer on opiates. Currently on benzo and stimulant from psychiatry.

## 2017-02-17 NOTE — Progress Notes (Signed)
Subjective:    Patient ID: Barbara Rogers, female    DOB: 08/26/1981, 36 y.o.   MRN: [id number] PCP: Harrison Mons, PA-C Chief Complaint  Patient presents with  . Follow-up    disability    HPI Nauseous and threw up 5 times this morning. Hasnt been able to eat for the last week- had salad yesterday and now throwing up today.   Not able to sleep. Not on pain medications. Pain management in [place]. Seen psychiatry. Neurology later this month. Still in a lot of pain.   Patient Active Problem List   Diagnosis Date Noted  . Chronic pain syndrome 01/13/2017  . Polysubstance (excluding opioids) dependence, daily use ([place]) 07/01/2016  . Seizure-like activity ([place]) 06/29/2016  . MDD (major depressive disorder), recurrent severe, without psychosis ([place]) 06/27/2016  . Suicidal ideation 06/26/2016  . Bradycardia 06/26/2016  . Tobacco abuse   . Hypokalemia 01/18/2015  . Migraine without aura   . IUD (intrauterine device) in place 06/02/2013  . Hypothyroid 12/09/2012  . Chronic left shoulder pain   . Mood disorder ([place]) 03/25/2012  . AR (allergic rhinitis) 03/25/2012  . Dysmenorrhea 03/25/2012  . PMDD (premenstrual dysphoric disorder) 03/25/2012  . Chronic insomnia 03/25/2012   Prior to Admission medications   Medication Sig Start Date End Date Taking? Authorizing Provider  ALPRAZolam Duanne Moron) 1 MG tablet Take 1 mg by mouth 4 (four) times daily.   Yes [provider]  amphetamine-dextroamphetamine (ADDERALL) 20 MG tablet Take 20 mg by mouth 3 (three) times daily.   Yes [provider]  Asenapine Maleate 10 MG SUBL Place 20 mg under the tongue at bedtime and may repeat dose one time if needed.   Yes [provider]  divalproex (DEPAKOTE ER) 500 MG 24 hr tablet Take 1,000 mg by mouth at bedtime.   Yes [provider]  ibuprofen (ADVIL,MOTRIN) 800 MG tablet Take 1 tablet (800 mg total) by mouth every 8 (eight) hours as needed. 01/13/17  Yes Jeffery,  Domingo Mend, PA-C  levonorgestrel (MIRENA) 20 MCG/24HR IUD 1 each by Intrauterine route once.   Yes [provider]  levothyroxine (SYNTHROID, LEVOTHROID) 50 MCG tablet TAKE 1 TABLET BY MOUTH EVERY DAY 11/04/16  Yes Jeffery, [name], PA-C  lidocaine (LIDODERM) 5 %  11/16/16  Yes [provider]  omeprazole (PRILOSEC) 20 MG capsule  11/09/16  Yes [provider]  ondansetron (ZOFRAN) 8 MG tablet  11/11/16  Yes [provider]  tiZANidine (ZANAFLEX) 4 MG tablet TAKE 2 TABLETS BY MOUTH 3 TIMES A DAY 02/11/17  Yes Jeffery, [name], PA-C  traZODone (DESYREL) 150 MG tablet  10/16/16  Yes [provider]  traZODone (DESYREL) 50 MG tablet Take 1 tablet (50 mg total) by mouth at bedtime as needed for sleep. 07/01/16  Yes Derrill Center, NP  VENTOLIN HFA 108 (90 Base) MCG/ACT inhaler TAKE 2 PUFFS EVERY 4 HOURS AS NEEDED 02/08/17  Yes Jeffery, [name], PA-C  zolpidem (AMBIEN) 10 MG tablet Take 5-10 mg by mouth at bedtime as needed for sleep.   Yes [provider]   Allergies  Allergen Reactions  . Penicillins Other (See Comments)    Unknown Has patient had a PCN reaction causing immediate rash, facial/tongue/throat swelling, SOB or lightheadedness with hypotension:  unknown Has patient had a PCN reaction causing severe rash involving mucus membranes or skin necrosis:  unknown Has patient had a PCN reaction that required hospitalization  unknown Has patient had a PCN reaction occurring within the last  10 years: unknown If all of the above answers are "NO", the  . Progestins     Extreme moodiness  . Progestins Other (See Comments)    Extreme moodiness  . Sinequan [Doxepin] Other (See Comments) and Hypertension    Hallucinations, delusions, severe agitation  . Sinequan [Doxepin] Other (See Comments)    hallucinations    Review of Systems  Constitutional: Negative for fever.  Gastrointestinal: Positive for nausea and vomiting.  Allergic/Immunologic: Positive for  environmental allergies.  Psychiatric/Behavioral: Positive for sleep disturbance.       Objective:   Physical Exam  Constitutional: She is oriented to person, place, and time.  Cardiovascular: Normal rate, regular rhythm, normal heart sounds and intact distal pulses.   Pulmonary/Chest: Effort normal.  Neurological: She is alert and oriented to person, place, and time.  Skin: Skin is warm and dry.  Psychiatric: Judgment and thought content normal. Her speech is not delayed. She is slowed. She does not exhibit a depressed mood.          Assessment & Plan:

## 2017-02-18 ENCOUNTER — Ambulatory Visit: Payer: Self-pay | Admitting: Neurology

## 2017-02-26 ENCOUNTER — Encounter (HOSPITAL_COMMUNITY): Payer: Self-pay | Admitting: Physical Therapy

## 2017-02-26 NOTE — Therapy (Signed)
[facility], [place], [address] Phone: [phone]   Fax:  [phone]  Patient Details  Name: Alexi Dorminey MRN: [id number] Date of Birth: December 21, 1980 Referring Provider:  No ref. provider found  Encounter Date: 02/26/2017   PHYSICAL THERAPY DISCHARGE SUMMARY  Visits from Start of Care: 2  Current functional level related to goals / functional outcomes: Patient has not returned since last skilled session    Remaining deficits: Unable to assess    Education / Equipment: N/A  Plan: Patient agrees to discharge.  Patient goals were not met. Patient is being discharged due to not returning since the last visit.  ?????       Deniece Ree PT, DPT [facility] [address] [place], [place], [address] Phone: [phone]   Fax:  [phone]

## 2017-03-06 ENCOUNTER — Encounter (HOSPITAL_COMMUNITY): Payer: Self-pay | Admitting: Emergency Medicine

## 2017-03-06 ENCOUNTER — Emergency Department (HOSPITAL_COMMUNITY)
Admission: EM | Admit: 2017-03-06 | Discharge: 2017-03-06 | Disposition: A | Discharge status: Home / Self Care | Payer: BLUE CROSS/BLUE SHIELD | Attending: Emergency Medicine | Admitting: Emergency Medicine

## 2017-03-06 ENCOUNTER — Emergency Department (HOSPITAL_COMMUNITY): Payer: BLUE CROSS/BLUE SHIELD

## 2017-03-06 DIAGNOSIS — M5441 Lumbago with sciatica, right side: Secondary | ICD-10-CM | POA: Diagnosis not present

## 2017-03-06 DIAGNOSIS — W1839XA Other fall on same level, initial encounter: Secondary | ICD-10-CM | POA: Insufficient documentation

## 2017-03-06 DIAGNOSIS — Y999 Unspecified external cause status: Secondary | ICD-10-CM | POA: Insufficient documentation

## 2017-03-06 DIAGNOSIS — G8929 Other chronic pain: Secondary | ICD-10-CM | POA: Diagnosis not present

## 2017-03-06 DIAGNOSIS — Y929 Unspecified place or not applicable: Secondary | ICD-10-CM | POA: Insufficient documentation

## 2017-03-06 DIAGNOSIS — Y939 Activity, unspecified: Secondary | ICD-10-CM | POA: Insufficient documentation

## 2017-03-06 DIAGNOSIS — F17290 Nicotine dependence, other tobacco product, uncomplicated: Secondary | ICD-10-CM | POA: Diagnosis not present

## 2017-03-06 DIAGNOSIS — Z79899 Other long term (current) drug therapy: Secondary | ICD-10-CM | POA: Insufficient documentation

## 2017-03-06 DIAGNOSIS — M542 Cervicalgia: Secondary | ICD-10-CM | POA: Diagnosis not present

## 2017-03-06 DIAGNOSIS — J45909 Unspecified asthma, uncomplicated: Secondary | ICD-10-CM | POA: Diagnosis not present

## 2017-03-06 DIAGNOSIS — S3992XA Unspecified injury of lower back, initial encounter: Secondary | ICD-10-CM | POA: Diagnosis present

## 2017-03-06 MED ORDER — HYDROCODONE-ACETAMINOPHEN 5-325 MG PO TABS
1.0000 | ORAL_TABLET | Freq: Once | ORAL | Status: AC
  Administered 2017-03-06: 1 via ORAL
  Filled 2017-03-06: qty 1

## 2017-03-06 MED ORDER — KETOROLAC TROMETHAMINE 30 MG/ML IJ SOLN
30.0000 mg | Freq: Once | INTRAMUSCULAR | Status: AC
  Administered 2017-03-06: 30 mg via INTRAMUSCULAR
  Filled 2017-03-06: qty 1

## 2017-03-06 NOTE — Discharge Instructions (Signed)
Your xrays are negative. Follow up with your primary doctor and pain clinic. Return to the ED if you develop new or worsening symptoms.
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
## 2017-03-10 ENCOUNTER — Other Ambulatory Visit: Payer: Self-pay

## 2017-03-10 ENCOUNTER — Emergency Department (HOSPITAL_COMMUNITY)
Admission: EM | Admit: 2017-03-10 | Discharge: 2017-03-10 | Disposition: A | Discharge status: Home / Self Care | Payer: BLUE CROSS/BLUE SHIELD | Attending: Emergency Medicine | Admitting: Emergency Medicine

## 2017-03-10 ENCOUNTER — Encounter (HOSPITAL_COMMUNITY): Payer: Self-pay

## 2017-03-10 ENCOUNTER — Emergency Department (HOSPITAL_COMMUNITY): Payer: BLUE CROSS/BLUE SHIELD

## 2017-03-10 DIAGNOSIS — M545 Low back pain: Secondary | ICD-10-CM | POA: Diagnosis not present

## 2017-03-10 DIAGNOSIS — G8929 Other chronic pain: Secondary | ICD-10-CM | POA: Diagnosis not present

## 2017-03-10 DIAGNOSIS — R55 Syncope and collapse: Secondary | ICD-10-CM

## 2017-03-10 DIAGNOSIS — E039 Hypothyroidism, unspecified: Secondary | ICD-10-CM | POA: Diagnosis not present

## 2017-03-10 DIAGNOSIS — J45909 Unspecified asthma, uncomplicated: Secondary | ICD-10-CM | POA: Insufficient documentation

## 2017-03-10 DIAGNOSIS — F17290 Nicotine dependence, other tobacco product, uncomplicated: Secondary | ICD-10-CM | POA: Insufficient documentation

## 2017-03-10 DIAGNOSIS — Z79899 Other long term (current) drug therapy: Secondary | ICD-10-CM | POA: Diagnosis not present

## 2017-03-10 LAB — I-STAT BETA HCG BLOOD, ED (NOT ORDERABLE): I-stat hCG, quantitative: <5 m[IU]/mL (ref <5)

## 2017-03-10 LAB — CBC
HEMATOCRIT: 42.1 % (ref 36.0–46.0)
HEMOGLOBIN: 14.6 g/dL (ref 12.0–15.0)
MCH: 30.5 pg (ref 26.0–34.0)
MCHC: 34.7 g/dL (ref 30.0–36.0)
MCV: 87.9 fL (ref 78.0–100.0)
Platelets: 206 10*3/uL (ref 150–400)
RBC: 4.79 MIL/uL (ref 3.87–5.11)
RDW: 12.9 % (ref 11.5–15.5)
WBC: 8.9 10*3/uL (ref 4.0–10.5)

## 2017-03-10 LAB — BASIC METABOLIC PANEL
ANION GAP: 10 (ref 5–15)
BUN: 8 mg/dL (ref 6–20)
CO2: 24 mmol/L (ref 22–32)
Calcium: 9.1 mg/dL (ref 8.9–10.3)
Chloride: 103 mmol/L (ref 101–111)
Creatinine, Ser: 0.77 mg/dL (ref 0.44–1.00)
GFR calc Af Amer: >60 mL/min (ref >60)
GFR calc non Af Amer: >60 mL/min (ref >60)
GLUCOSE: 122 mg/dL — AB (ref 65–99)
POTASSIUM: 3.2 mmol/L — AB (ref 3.5–5.1)
Sodium: 137 mmol/L (ref 135–145)

## 2017-03-10 LAB — TROPONIN I: Troponin I: <0.03 ng/mL (ref <0.03)

## 2017-03-10 MED ORDER — SODIUM CHLORIDE 0.9 % IV SOLN: INTRAVENOUS | Status: DC

## 2017-03-10 MED ORDER — SODIUM CHLORIDE 0.9 % IV BOLUS (SEPSIS)
1000.0000 mL | Freq: Once | INTRAVENOUS | Status: AC
Start: 2017-03-10 — End: 2017-03-10
  Administered 2017-03-10: 1000 mL via INTRAVENOUS

## 2017-03-10 MED ORDER — KETOROLAC TROMETHAMINE 30 MG/ML IJ SOLN
30.0000 mg | Freq: Once | INTRAMUSCULAR | Status: AC
  Administered 2017-03-10: 30 mg via INTRAVENOUS
  Filled 2017-03-10: qty 1

## 2017-03-10 NOTE — ED Provider Notes (Signed)
[facility] DEPT Provider Note   CSN: [id number] Arrival date & time: 03/10/17  1356     History   Chief Complaint Chief Complaint  Patient presents with  . Near Syncope  . Chest Pain    HPI Barbara Rogers is a 36 y.o. female.  Patient with a history of chronic back pain. Patient seen in the emergency department June 28. Patient's followed by [facility] family practice is being managed with non-narcotic for the chronic pain by them. Patient is awaiting formal referral to pain management. The currently family practice is doing pain management.  Patient presents today with a complaint of syncope and near syncopal episodes for the past month. Which is resulted in falls which made her back pain worse. This is tied into her visit on June 28. Patient here stating that she just feeling fatigued and weak. Also complaining of some chest tightness all day. Patient to nursing appear drowsy. Speech seemed to be slurred in triage.        Past Medical History:  Diagnosis Date  . AKI (acute kidney injury) ([place]) 01/18/2015  . Allergy   . Anxiety   . Asthma    exacerbated by bronchitisi  . Asthma   . C3 cervical fracture ([place])   . C3 cervical fracture ([place]) 01/29/2016  . Cervical transverse process fracture ([place])   . Chronic left shoulder pain 08/2012  . Clavicle fracture   . Concussion 01/29/2016  . Depression   . 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 Dysmenorrhea   . Dysphagia   . Finger fracture, left 10/31/2012   LEFT 4th finger  . Insomnia   . Lumbar transverse process fracture ([place])   . MDD (major depressive disorder), recurrent episode, severe ([place]) 06/30/2016  . 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 Migraine without aura   . Mood disorder ([place])   . Nephrolithiasis   . OD (overdose of drug), intentional self-harm, initial encounter ([place]) 06/26/2016  . PMDD (premenstrual dysphoric disorder)   . Respiratory  failure ([place])   . Seizure-like activity ([place]) 06/30/2016   normal EEG, question pseudoseizures  . T12 burst fracture ([place])   . Traumatic hemo-pneumothorax   . Traumatic hemopneumothorax 02/09/2016    Patient Active Problem List   Diagnosis Date Noted  . Chronic pain syndrome 01/13/2017  . Polysubstance (excluding opioids) dependence, daily use ([place]) 07/01/2016  . Seizure-like activity ([place]) 06/29/2016  . MDD (major depressive disorder), recurrent severe, without psychosis ([place]) 06/27/2016  . Suicidal ideation 06/26/2016  . Bradycardia 06/26/2016  . Tobacco abuse   . Hypokalemia 01/18/2015  . Migraine without aura   . IUD (intrauterine device) in place 06/02/2013  . Hypothyroid 12/09/2012  . Chronic left shoulder pain   . Mood disorder ([place]) 03/25/2012  . AR (allergic rhinitis) 03/25/2012  . Dysmenorrhea 03/25/2012  . PMDD (premenstrual dysphoric disorder) 03/25/2012  . Chronic insomnia 03/25/2012    Past Surgical History:  Procedure Laterality Date  . FRACTURE SURGERY    . POSTERIOR LUMBAR FUSION 4 LEVEL N/A 01/30/2016   Procedure: T9-L2 Posterior Stabilization, Posterior Lumbar Fusion with Pedicle Screws;  Surgeon: Kary Kos, MD;  Location: [facility];  Service: Neurosurgery;  Laterality: N/A;    OB History    Gravida Para Term Preterm AB Living   3 0 0 0 3     SAB TAB Ectopic Multiple Live Births   0 3 0  Home Medications    Prior to Admission medications   Medication Sig Start Date End Date Taking? Authorizing Provider  albuterol (VENTOLIN HFA) 108 (90 Base) MCG/ACT inhaler TAKE 2 PUFFS EVERY 4 HOURS AS NEEDED 02/17/17  Yes Jeffery, [name], PA-C  ALPRAZolam (XANAX) 1 MG tablet Take 1 mg by mouth 4 (four) times daily.   Yes [provider]  amphetamine-dextroamphetamine (ADDERALL) 20 MG tablet Take 20 mg by mouth 3 (three) times daily.   Yes [provider]  ibuprofen (ADVIL,MOTRIN) 800 MG tablet Take 1 tablet (800 mg total) by mouth  every 8 (eight) hours as needed. 01/13/17  Yes Jeffery, Domingo Mend, PA-C  levonorgestrel (MIRENA) 20 MCG/24HR IUD 1 each by Intrauterine route once.   Yes [provider]  levothyroxine (SYNTHROID, LEVOTHROID) 50 MCG tablet TAKE 1 TABLET BY MOUTH EVERY DAY 11/04/16  Yes Jeffery, [name], PA-C  lidocaine (LIDODERM) 5 %  11/16/16  Yes [provider]  omeprazole (PRILOSEC) 20 MG capsule  11/09/16  Yes [provider]  ondansetron (ZOFRAN) 8 MG tablet  11/11/16  Yes [provider]  tiZANidine (ZANAFLEX) 4 MG tablet Take 3 tablets (12 mg total) by mouth 3 (three) times daily. 02/17/17  Yes Jeffery, [name], PA-C  zolpidem (AMBIEN) 10 MG tablet Take 5-10 mg by mouth at bedtime as needed for sleep.   Yes [provider]  Asenapine Maleate 10 MG SUBL Place 20 mg under the tongue at bedtime and may repeat dose one time if needed.    [provider]  divalproex (DEPAKOTE ER) 500 MG 24 hr tablet Take 1,000 mg by mouth at bedtime.    [provider]  pregabalin (LYRICA) 100 MG capsule 100 mg PO TID x 1 week, then increase to 200 mg TID Patient not taking: Reported on 03/10/2017 02/17/17   Harrison Mons, PA-C  traZODone (DESYREL) 50 MG tablet Take 1 tablet (50 mg total) by mouth at bedtime as needed for sleep. Patient not taking: Reported on 03/10/2017 07/01/16   Derrill Center, NP    Family History Family History  Problem Relation Age of Onset  . Migraines Mother   . Mental illness Mother   . Heart disease Father   . Alcohol abuse Father   . Arthritis Maternal Grandmother   . Thyroid disease Maternal Grandmother   . Heart disease Maternal Grandfather        AMI 1996, 2014  . Anemia Maternal Grandfather        bone marrow dysfunction    Social History Social History  Substance Use Topics  . Smoking status: Current Every Day Smoker    Packs/day: 1.00    Types: E-cigarettes  . Smokeless tobacco: Never Used  . Alcohol use 0.0 oz/week     Comment:  occasionally     Allergies   Penicillins; Progestins; Progestins; Sinequan [doxepin]; and Sinequan [doxepin]   Review of Systems Review of Systems  Constitutional: Positive for fatigue.  HENT: Negative for congestion.   Eyes: Negative for visual disturbance.  Respiratory: Positive for shortness of breath.   Cardiovascular: Positive for chest pain.  Gastrointestinal: Negative for abdominal pain.  Genitourinary: Negative for dysuria.  Musculoskeletal: Positive for back pain.  Skin: Negative for rash.  Neurological: Positive for weakness. Negative for headaches.  Hematological: Does not bruise/bleed easily.  Psychiatric/Behavioral: Negative for confusion.     Physical Exam Updated Vital Signs BP 122/84   Pulse (!) 45   Temp 98.7 F (37.1 C) (Oral)   Resp Marland Kitchen)  9   Ht 1.753 m (5\' 9" )   Wt 77.1 kg (170 lb)   LMP 02/07/2017   SpO2 98%   BMI 25.10 kg/m   Physical Exam  Constitutional: She appears well-developed and well-nourished. No distress.  Asian somewhat drowsy pale in appearance and speech sounds slightly slurred.  HENT:  Head: Normocephalic and atraumatic.  Mucous membranes dry.  Eyes: Conjunctivae and EOM are normal. Pupils are equal, round, and reactive to light.  Neck: Normal range of motion.  Cardiovascular: Normal rate and regular rhythm.   Bradycardic  Pulmonary/Chest: Effort normal and breath sounds normal. No respiratory distress.  Abdominal: Soft. Bowel sounds are normal. There is no tenderness.  Neurological: She is alert. She displays normal reflexes. No cranial nerve deficit or sensory deficit. She exhibits normal muscle tone. Coordination normal.  Skin: There is pallor.     ED Treatments / Results  Labs (all labs ordered are listed, but only abnormal results are displayed) Labs Reviewed  BASIC METABOLIC PANEL - Abnormal; Notable for the following:       Result Value   Potassium 3.2 (*)    Glucose, Bld 122 (*)    All other components within  normal limits  CBC  TROPONIN I  I-STAT BETA HCG BLOOD, ED ([facility], [facility], [facility] ONLY)  I-STAT BETA HCG BLOOD, ED (NOT ORDERABLE)    EKG  EKG Interpretation None       Radiology Dg Chest 2 View  Result Date: 03/10/2017 CLINICAL DATA:  Chest pain EXAM: CHEST  2 VIEW COMPARISON:  09/20/2016 FINDINGS: Cardiac shadow is within normal limits. The lungs are well aerated bilaterally. No focal infiltrate or sizable effusion is seen. Old healed rib fractures are noted on right. Changes of prior spinal fusion are seen. Chronic T12 compression deformity is noted. IMPRESSION: No active cardiopulmonary disease. Electronically Signed   By: Inez Catalina M.D.   On: 03/10/2017 14:27    Procedures Procedures (including critical care time)  Medications Ordered in ED Medications  0.9 %  sodium chloride infusion (not administered)  ketorolac (TORADOL) 30 MG/ML injection 30 mg (not administered)  sodium chloride 0.9 % bolus 1,000 mL (0 mLs Intravenous Stopped 03/10/17 1632)     Initial Impression / Assessment and Plan / ED Course  I have reviewed the triage vital signs and the nursing notes.  Pertinent labs & imaging results that were available during my care of the patient were reviewed by me and considered in my medical decision making (see chart for details).   patient with a history of chronic back pain. Offered to do a CT of the back that began since her's been some falls since when she was seen on the 28th. Patient refused that. Patient was pushing hard for narcotic pain medicine. Made it clear that since she is under nonnarcotic pain management by family medicine currently would not give any narcotics. Patient was given IV Toradol. Patient improved significantly with IV fluids. Slurred speech improved. Patient without any concern for pulmonary embolus oxygen saturation saturations are in the high 90s and heart rate is sinus bradycardia. Patient in no respiratory distress. Feel the patient was dehydrated.  Patient also may have some other medications on board but she has improved and become more alert here and feels better overall. Patient will follow-up follow-up with her primary care doctors. Offered to do d-dimer testing patient did not want to have that done.    Final Clinical Impressions(s) / ED Diagnoses   Final diagnoses:  Syncope  and collapse    New Prescriptions New Prescriptions   No medications on file     Fredia Sorrow, MD 03/10/17 [phone]

## 2017-03-10 NOTE — ED Notes (Signed)
EKG given to Dr. Zackowski  

## 2017-03-10 NOTE — Discharge Instructions (Signed)
Continue to hydrate yourself well. Make appointment to follow-up with your doctors. Return for any new or worse symptoms.

## 2017-03-10 NOTE — ED Triage Notes (Addendum)
Patient reports of recent falls and near syncopal episodes x1 month. Complains of lower back pain r/t fall. Reports of chest tightness while laying on the couch today.   Patient is slurring words and appears drowsy. A&Ox4.

## 2017-03-24 ENCOUNTER — Encounter: Payer: Self-pay | Admitting: Physician Assistant

## 2017-03-27 MED ORDER — FLUCONAZOLE 150 MG PO TABS: 150.0000 mg | ORAL_TABLET | Freq: Once | ORAL | 0 refills | Status: AC

## 2017-03-28 ENCOUNTER — Other Ambulatory Visit: Payer: Self-pay | Admitting: Physician Assistant

## 2017-03-28 DIAGNOSIS — G894 Chronic pain syndrome: Secondary | ICD-10-CM

## 2017-04-05 ENCOUNTER — Encounter: Payer: Self-pay | Admitting: Physician Assistant

## 2017-04-06 ENCOUNTER — Encounter: Payer: Self-pay | Admitting: Physician Assistant

## 2017-04-26 ENCOUNTER — Encounter: Payer: Self-pay | Admitting: Physician Assistant

## 2017-04-26 ENCOUNTER — Ambulatory Visit (INDEPENDENT_AMBULATORY_CARE_PROVIDER_SITE_OTHER): Payer: BLUE CROSS/BLUE SHIELD | Admitting: Physician Assistant

## 2017-04-26 VITALS — BP 138/89 | HR 104 | Temp 97.5°F | Resp 20 | Ht 67.2 in | Wt 181.0 lb

## 2017-04-26 DIAGNOSIS — F1920 Other psychoactive substance dependence, uncomplicated: Secondary | ICD-10-CM | POA: Diagnosis not present

## 2017-04-26 DIAGNOSIS — J309 Allergic rhinitis, unspecified: Secondary | ICD-10-CM | POA: Diagnosis not present

## 2017-04-26 DIAGNOSIS — G894 Chronic pain syndrome: Secondary | ICD-10-CM

## 2017-04-26 DIAGNOSIS — M25511 Pain in right shoulder: Secondary | ICD-10-CM | POA: Diagnosis not present

## 2017-04-26 DIAGNOSIS — R569 Unspecified convulsions: Secondary | ICD-10-CM | POA: Diagnosis not present

## 2017-04-26 DIAGNOSIS — F332 Major depressive disorder, recurrent severe without psychotic features: Secondary | ICD-10-CM

## 2017-04-26 MED ORDER — ALBUTEROL SULFATE HFA 108 (90 BASE) MCG/ACT IN AERS: INHALATION_SPRAY | RESPIRATORY_TRACT | 1 refills | Status: DC

## 2017-04-26 MED ORDER — TIZANIDINE HCL 4 MG PO TABS: ORAL_TABLET | ORAL | 1 refills | Status: DC

## 2017-04-26 MED ORDER — LIDOCAINE 5 % EX PTCH: 1.0000 | MEDICATED_PATCH | CUTANEOUS | 1 refills | Status: DC

## 2017-04-26 NOTE — Patient Instructions (Signed)
     IF you received an x-ray today, you will receive an invoice from [facility]. Please contact [facility] at [phone] with questions or concerns regarding your invoice.   IF you received labwork today, you will receive an invoice from [facility]. Please contact [facility] at [phone] with questions or concerns regarding your invoice.   Our billing staff will not be able to assist you with questions regarding bills from these companies.  You will be contacted with the lab results as soon as they are available. The fastest way to get your results is to activate your My Chart account. Instructions are located on the last page of this paperwork. If you have not heard from us regarding the results in 2 weeks, please contact this office.     

## 2017-04-26 NOTE — Progress Notes (Signed)
Patient ID: Barbara Rogers, female    DOB: [birth year], 36 y.o.   MRN: [id number]  PCP: Harrison Mons, PA-C  Chief Complaint  Patient presents with  . Other    Complete disability forms  . Shoulder Pain    Right shoulder. Started 04/10/17. Patient was pulling a loaded wagon    Subjective:   Presents for evaluation of RIGHT shoulder pain and to complete disability forms..  Split up with her fiance. Doing all the heavy lifting at her home. Water bladder ruptured 04/05/2017, carrying 10 gallons of water from her neighbor's in a wagon. RIGHT shoulder pain has developed.  She had been out of work on disability PRIOR to this injury, due to chronic pain in the LEFT shoulder and back. Did return to work as planned on 04/09/2017, but only for 1 day. Her back pain was too bad to return. RIGHT shoulder very painful. LEFT shoulder remains painful.  She tried to come in to be seen, but I have been out of the office since 04/10/2017, and our office policy is that in cases of disability, unless there is a medical urgency, the patient must be seen by the same provider in follow up.  Pain Management appointment is 05/02/2017.  Needs new disability papers completed. Hopes to get her pain under control and return to work in October or November.   Review of Systems As above.    Patient Active Problem List   Diagnosis Date Noted  . Chronic pain syndrome 01/13/2017  . Polysubstance (excluding opioids) dependence, daily use ([place]) 07/01/2016  . Seizure-like activity ([place]) 06/29/2016  . MDD (major depressive disorder), recurrent severe, without psychosis ([place]) 06/27/2016  . Suicidal ideation 06/26/2016  . Bradycardia 06/26/2016  . Tobacco abuse   . Hypokalemia 01/18/2015  . Migraine without aura   . IUD (intrauterine device) in place 06/02/2013  . Hypothyroid 12/09/2012  . Chronic left shoulder pain   . Mood disorder ([place]) 03/25/2012  . AR (allergic rhinitis) 03/25/2012  .  Dysmenorrhea 03/25/2012  . PMDD (premenstrual dysphoric disorder) 03/25/2012  . Chronic insomnia 03/25/2012     Prior to Admission medications   Medication Sig Start Date End Date Taking? Authorizing Provider  albuterol (VENTOLIN HFA) 108 (90 Base) MCG/ACT inhaler TAKE 2 PUFFS EVERY 4 HOURS AS NEEDED 02/17/17  Yes Jaisha Villacres, PA-C  ALPRAZolam (XANAX) 1 MG tablet Take 1 mg by mouth 4 (four) times daily.   Yes [provider]  amphetamine-dextroamphetamine (ADDERALL) 20 MG tablet Take 20 mg by mouth 3 (three) times daily.   Yes [provider]  Asenapine Maleate 10 MG SUBL Place 20 mg under the tongue at bedtime and may repeat dose one time if needed.   Yes [provider]  divalproex (DEPAKOTE ER) 500 MG 24 hr tablet Take 1,000 mg by mouth at bedtime.   Yes [provider]  ibuprofen (ADVIL,MOTRIN) 800 MG tablet Take 1 tablet (800 mg total) by mouth every 8 (eight) hours as needed. 01/13/17  Yes Cathey Fredenburg, Domingo Mend, PA-C  levonorgestrel (MIRENA) 20 MCG/24HR IUD 1 each by Intrauterine route once.   Yes [provider]  levothyroxine (SYNTHROID, LEVOTHROID) 50 MCG tablet TAKE 1 TABLET BY MOUTH EVERY DAY 11/04/16  Yes Kenyada Hy, PA-C  lidocaine (LIDODERM) 5 %  11/16/16  Yes [provider]  omeprazole (PRILOSEC) 20 MG capsule  11/09/16  Yes [provider]  ondansetron (ZOFRAN) 8 MG tablet  11/11/16  Yes [provider]  tiZANidine (ZANAFLEX)  4 MG tablet TAKE 3 TABLETS BY MOUTH 3 TIMES A DAY 03/29/17  Yes Earlean Fidalgo, PA-C  traZODone (DESYREL) 50 MG tablet Take 1 tablet (50 mg total) by mouth at bedtime as needed for sleep. 07/01/16  Yes Derrill Center, NP  zolpidem (AMBIEN) 10 MG tablet Take 5-10 mg by mouth at bedtime as needed for sleep.   Yes [provider]     Allergies  Allergen Reactions  . Penicillins Other (See Comments)    Unknown Has patient had a PCN reaction causing immediate rash,  facial/tongue/throat swelling, SOB or lightheadedness with hypotension:  unknown Has patient had a PCN reaction causing severe rash involving mucus membranes or skin necrosis:  unknown Has patient had a PCN reaction that required hospitalization  unknown Has patient had a PCN reaction occurring within the last 10 years: unknown If all of the above answers are "NO", the  . Progestins     Extreme moodiness  . Progestins Other (See Comments)    Extreme moodiness  . Sinequan [Doxepin] Other (See Comments) and Hypertension    Hallucinations, delusions, severe agitation  . Sinequan [Doxepin] Other (See Comments)    hallucinations       Objective:  Physical Exam  Constitutional: She is oriented to person, place, and time. She appears well-developed and well-nourished. She is active and cooperative. No distress.  BP 138/89   Pulse (!) 104   Temp (!) 97.5 F (36.4 C) (Oral)   Resp 20   Ht 5' 7.2" (1.707 m)   Wt 181 lb (82.1 kg)   SpO2 97%   BMI 28.18 kg/m   HENT:  Head: Normocephalic and atraumatic.  Right Ear: Hearing normal.  Left Ear: Hearing normal.  Eyes: Conjunctivae are normal. No scleral icterus.  Neck: Normal range of motion. Neck supple. No thyromegaly present.  Cardiovascular: Normal rate, regular rhythm and normal heart sounds.   Pulses:      Radial pulses are 2+ on the right side, and 2+ on the left side.  Pulmonary/Chest: Effort normal and breath sounds normal.  Musculoskeletal:  Generalized RIGHT shoulder pain. ROM full, but with pain.  Generalized LEFT shoulder pain. FROM, also with pain. Thoracic and lumbar pain.   Lymphadenopathy:       Head (right side): No tonsillar, no preauricular, no posterior auricular and no occipital adenopathy present.       Head (left side): No tonsillar, no preauricular, no posterior auricular and no occipital adenopathy present.    She has no cervical adenopathy.       Right: No supraclavicular adenopathy present.       Left: No  supraclavicular adenopathy present.  Neurological: She is alert and oriented to person, place, and time. She has normal strength. No sensory deficit.  Skin: Skin is warm, dry and intact. No rash noted. No cyanosis or erythema. Nails show no clubbing.  Psychiatric: She has a normal mood and affect. Her speech is normal and behavior is normal.           Assessment & Plan:   Problem List Items Addressed This Visit    AR (allergic rhinitis) (Chronic)    Controlled. Needs refill of albuterol rescue inhaler for PRN use.      Relevant Medications   albuterol (VENTOLIN HFA) 108 (90 Base) MCG/ACT inhaler   Polysubstance (excluding opioids) dependence, daily use ([facility]) (Chronic)    No longer on opiates. Controlled substances include Adderall, zolpidem and benzodiazepine from her psychiatrist.  MDD (major depressive disorder), recurrent severe, without psychosis ([place])    Stable. Continue Depakote as mood stabilizer.Expect some improvement once pain is under better control.      Relevant Medications   traZODone (DESYREL) 150 MG tablet   Seizure-like activity ([facility])    Continues to remain seizure-free. Continue Depakote as mood stabilizer.      Chronic pain syndrome    Refilled tizanidine and lidocaine patches. Proceed with Pain Management visit later this month.      Relevant Medications   tiZANidine (ZANAFLEX) 4 MG tablet   lidocaine (LIDODERM) 5 %    Other Visit Diagnoses    Acute pain of right shoulder    -  Primary   rest. ice. avoid aggravating activities.       Return in about 4 weeks (around 05/24/2017).   Fara Chute, PA-C [facility]

## 2017-05-05 ENCOUNTER — Encounter: Payer: Self-pay | Admitting: Physician Assistant

## 2017-05-05 ENCOUNTER — Telehealth: Payer: Self-pay | Admitting: Physician Assistant

## 2017-05-05 NOTE — Telephone Encounter (Signed)
PATIENT STATES SHE NEEDS [name] TO WRITE HER A LETTER SAYING THAT [name] WAS NOT AVAILABLE TO SEE HER WHEN SHE NEEDED HER BECAUSE [name] HAS BEEN ON VACATION. SHE NEEDS THE LETTER TO GO TO [employer] [facility]. SHE SAID WE HAVE ALL THE INFORMATION ON TE[employer] IN HER CHART. PATIENT DOES NOT HAVE A PHONE AT THIS TIME SO SHE WILL CALL BACK IN 48 - 54 HOURS TO SEE IF IT HAS BEEN DONE YET. [place]

## 2017-05-06 ENCOUNTER — Encounter: Payer: Self-pay | Admitting: Physician Assistant

## 2017-05-06 NOTE — Telephone Encounter (Signed)
Letter printed and signed at 104.

## 2017-05-06 NOTE — Telephone Encounter (Signed)
Letter signed and at 104 at the front desk.

## 2017-05-13 ENCOUNTER — Encounter: Payer: Self-pay | Admitting: Physician Assistant

## 2017-05-13 NOTE — Assessment & Plan Note (Addendum)
No longer on opiates. Controlled substances include Adderall, zolpidem and benzodiazepine from her psychiatrist.

## 2017-05-13 NOTE — Assessment & Plan Note (Signed)
Controlled. Needs refill of albuterol rescue inhaler for PRN use.

## 2017-05-13 NOTE — Assessment & Plan Note (Addendum)
Stable. Continue Depakote as mood stabilizer.Expect some improvement once pain is under better control.

## 2017-05-13 NOTE — Assessment & Plan Note (Signed)
Continues to remain seizure-free. Continue Depakote as mood stabilizer.

## 2017-05-13 NOTE — Assessment & Plan Note (Signed)
Refilled tizanidine and lidocaine patches. Proceed with Pain Management visit later this month.

## 2017-05-15 ENCOUNTER — Encounter: Payer: Self-pay | Admitting: Physician Assistant

## 2017-05-19 ENCOUNTER — Encounter: Payer: Self-pay | Admitting: Physician Assistant

## 2017-05-21 NOTE — Telephone Encounter (Signed)
Please fax letter to patient's employer.

## 2017-05-22 ENCOUNTER — Encounter: Payer: Self-pay | Admitting: Physician Assistant

## 2017-05-25 ENCOUNTER — Other Ambulatory Visit: Payer: Self-pay | Admitting: Physician Assistant

## 2017-05-25 DIAGNOSIS — G894 Chronic pain syndrome: Secondary | ICD-10-CM

## 2017-05-25 MED ORDER — IBUPROFEN 800 MG PO TABS: 800.0000 mg | ORAL_TABLET | Freq: Three times a day (TID) | ORAL | 0 refills | Status: DC | PRN

## 2017-05-26 ENCOUNTER — Telehealth: Payer: Self-pay | Admitting: Physician Assistant

## 2017-05-26 NOTE — Telephone Encounter (Signed)
Barbara Rogers - Pt needs the note from 05-06-17 refaxed to her insurance company.   She has an abcessed tooth and found out today that she had no benefits.  The insurance company said they never received the letter, but would reinstate it when they receive the letter.  She needs it sent to [phone] asap and attach the ID# [id number] on the fax.

## 2017-05-26 NOTE — Telephone Encounter (Signed)
I see there is a lot of threads to this already. I see the letter saying she can return to work on Monday 05/26/2017. Is this the letter that needs to be faxed to her insurance company?

## 2017-05-27 NOTE — Telephone Encounter (Signed)
Letter has been faxed.

## 2017-05-27 NOTE — Telephone Encounter (Signed)
Yes, that is the letter.

## 2017-05-28 NOTE — Telephone Encounter (Signed)
PATIENT CALLED TO LET [name] KNOW THAT SHE NEEDS A WORK NOTE PUTTING HER BACK TO NORMAL DUTY ON OCT. 1, 2018. PLEASE CALL HER WHEN IT CAN BE PICKED UP. SHE ALSO WANTED THE LETTER [name] WROTE ON AUG. 28, 2018 FAXED AGAIN BECAUSE SHE SAID [employer] [facility] DID NOT GET IT THE FIRST TIME. (I RE-FAXED AND MAILED A COPY TO HER WITH SUCCESSFUL CONFIRMATION). BEST PHONE [phone] (CELL) [place]

## 2017-05-30 ENCOUNTER — Telehealth: Payer: Self-pay | Admitting: Physician Assistant

## 2017-05-30 NOTE — Telephone Encounter (Signed)
PT. Called 05/30/17 to request a note to be given for back to work with full duty for 06/09/17. She will need it faxed to her insurance company as well.  FAX: [phone] Please attach the participation # [id number]  PT. Would also like a copy mailed to her at:  [address] [place], [place] [address]

## 2017-06-02 NOTE — Telephone Encounter (Signed)
I wrote a letter for her to go back to work last week, on 9/17. THAT note has been faxed. I will need to see her again if she has been out of work again.

## 2017-06-03 NOTE — Telephone Encounter (Signed)
I tried both numbers mobile disconnected and home someone answered said it was wrong number.

## 2017-06-05 ENCOUNTER — Encounter: Payer: Self-pay | Admitting: Physician Assistant

## 2017-06-06 ENCOUNTER — Telehealth: Payer: Self-pay

## 2017-06-06 ENCOUNTER — Encounter: Payer: Self-pay | Admitting: Physician Assistant

## 2017-06-06 DIAGNOSIS — G894 Chronic pain syndrome: Secondary | ICD-10-CM

## 2017-06-06 MED ORDER — TIZANIDINE HCL 4 MG PO TABS: ORAL_TABLET | ORAL | 0 refills | Status: DC

## 2017-06-06 NOTE — Telephone Encounter (Signed)
Revised letter. Please fax to number in her previous message.

## 2017-06-19 IMAGING — CR DG CHEST 2V
2 series · 2 of 2 positions shown · non-contrast
Comparison: None.

CLINICAL DATA: 35-year-old female with fever and seizure.

EXAM:
CHEST  2 VIEW

[chest pa]
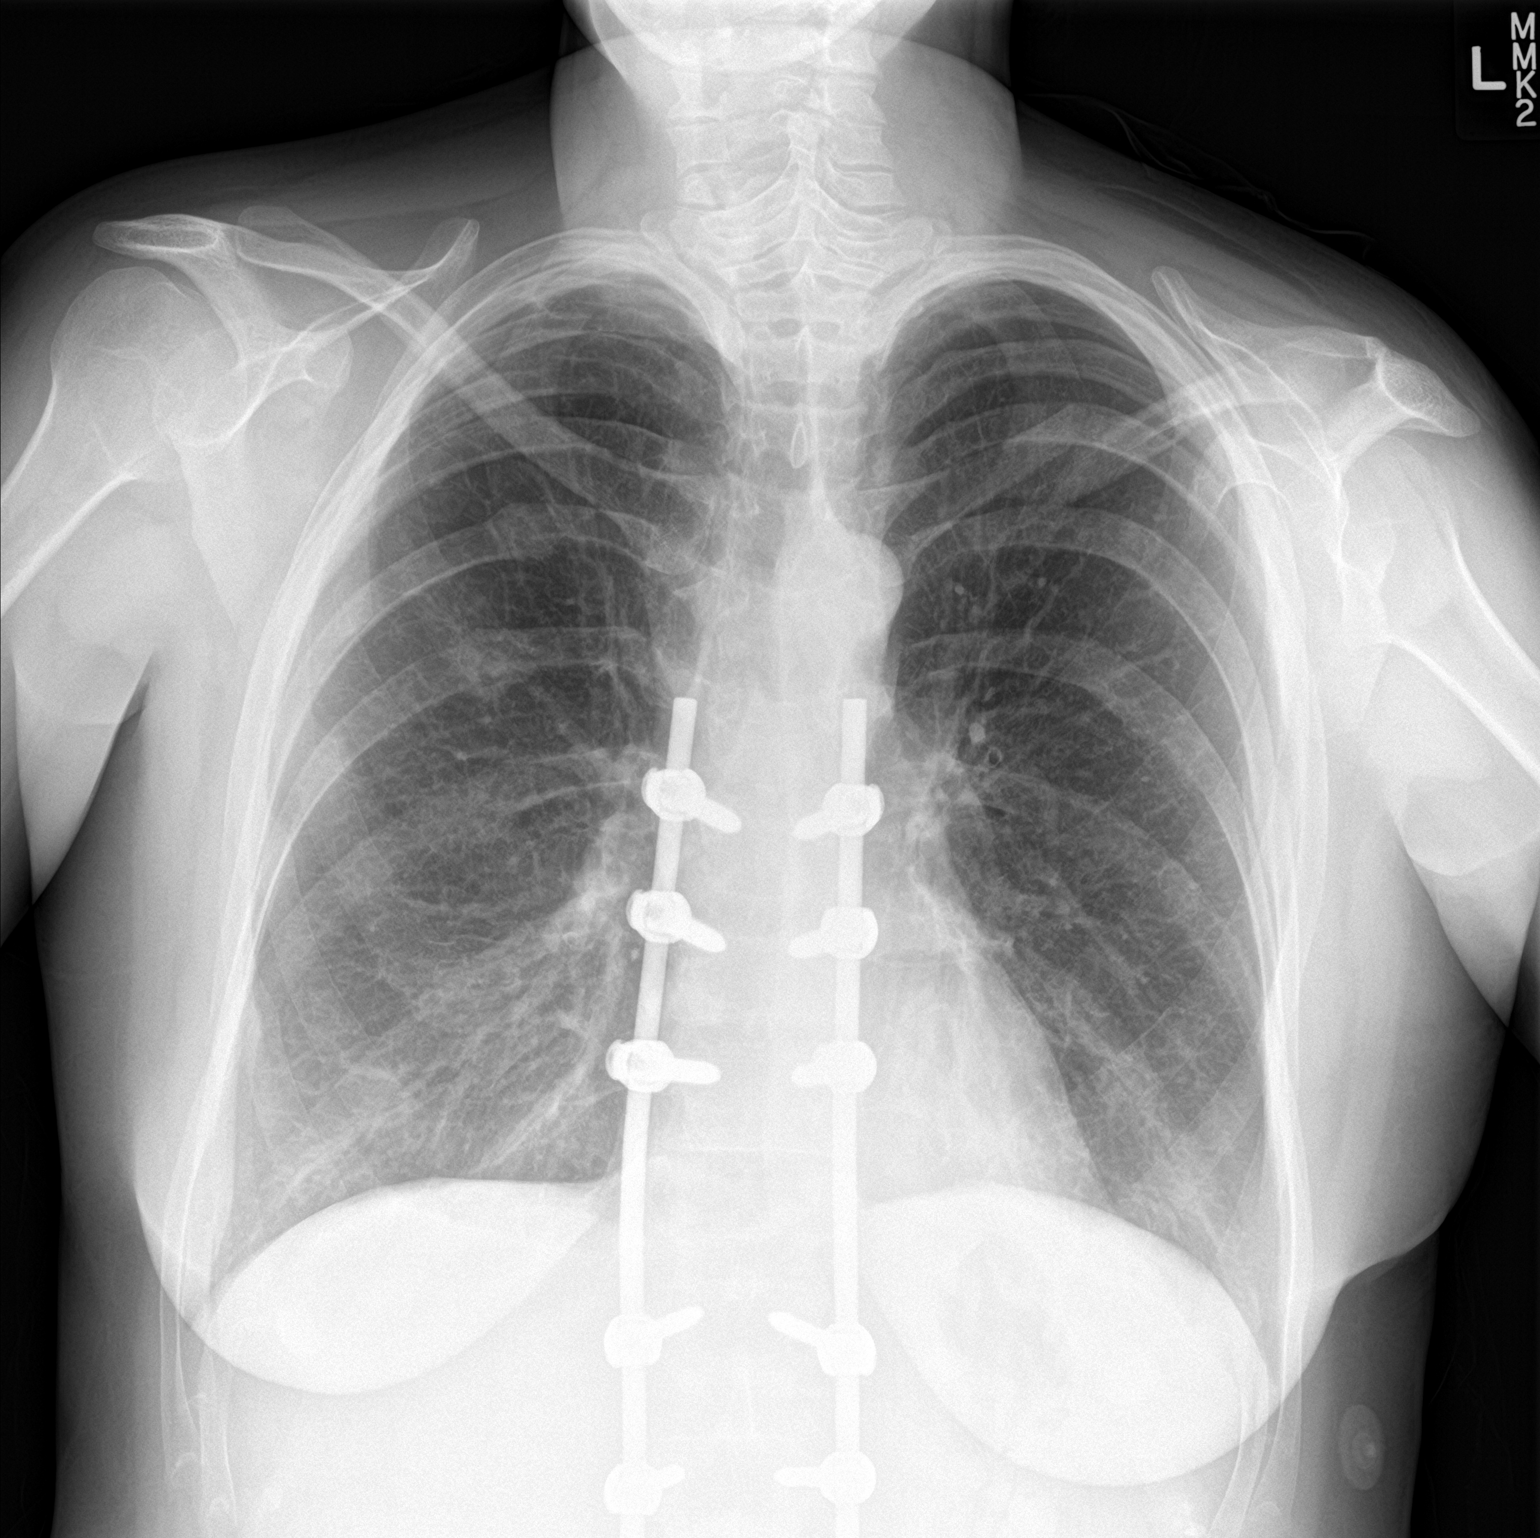

[chest lat]
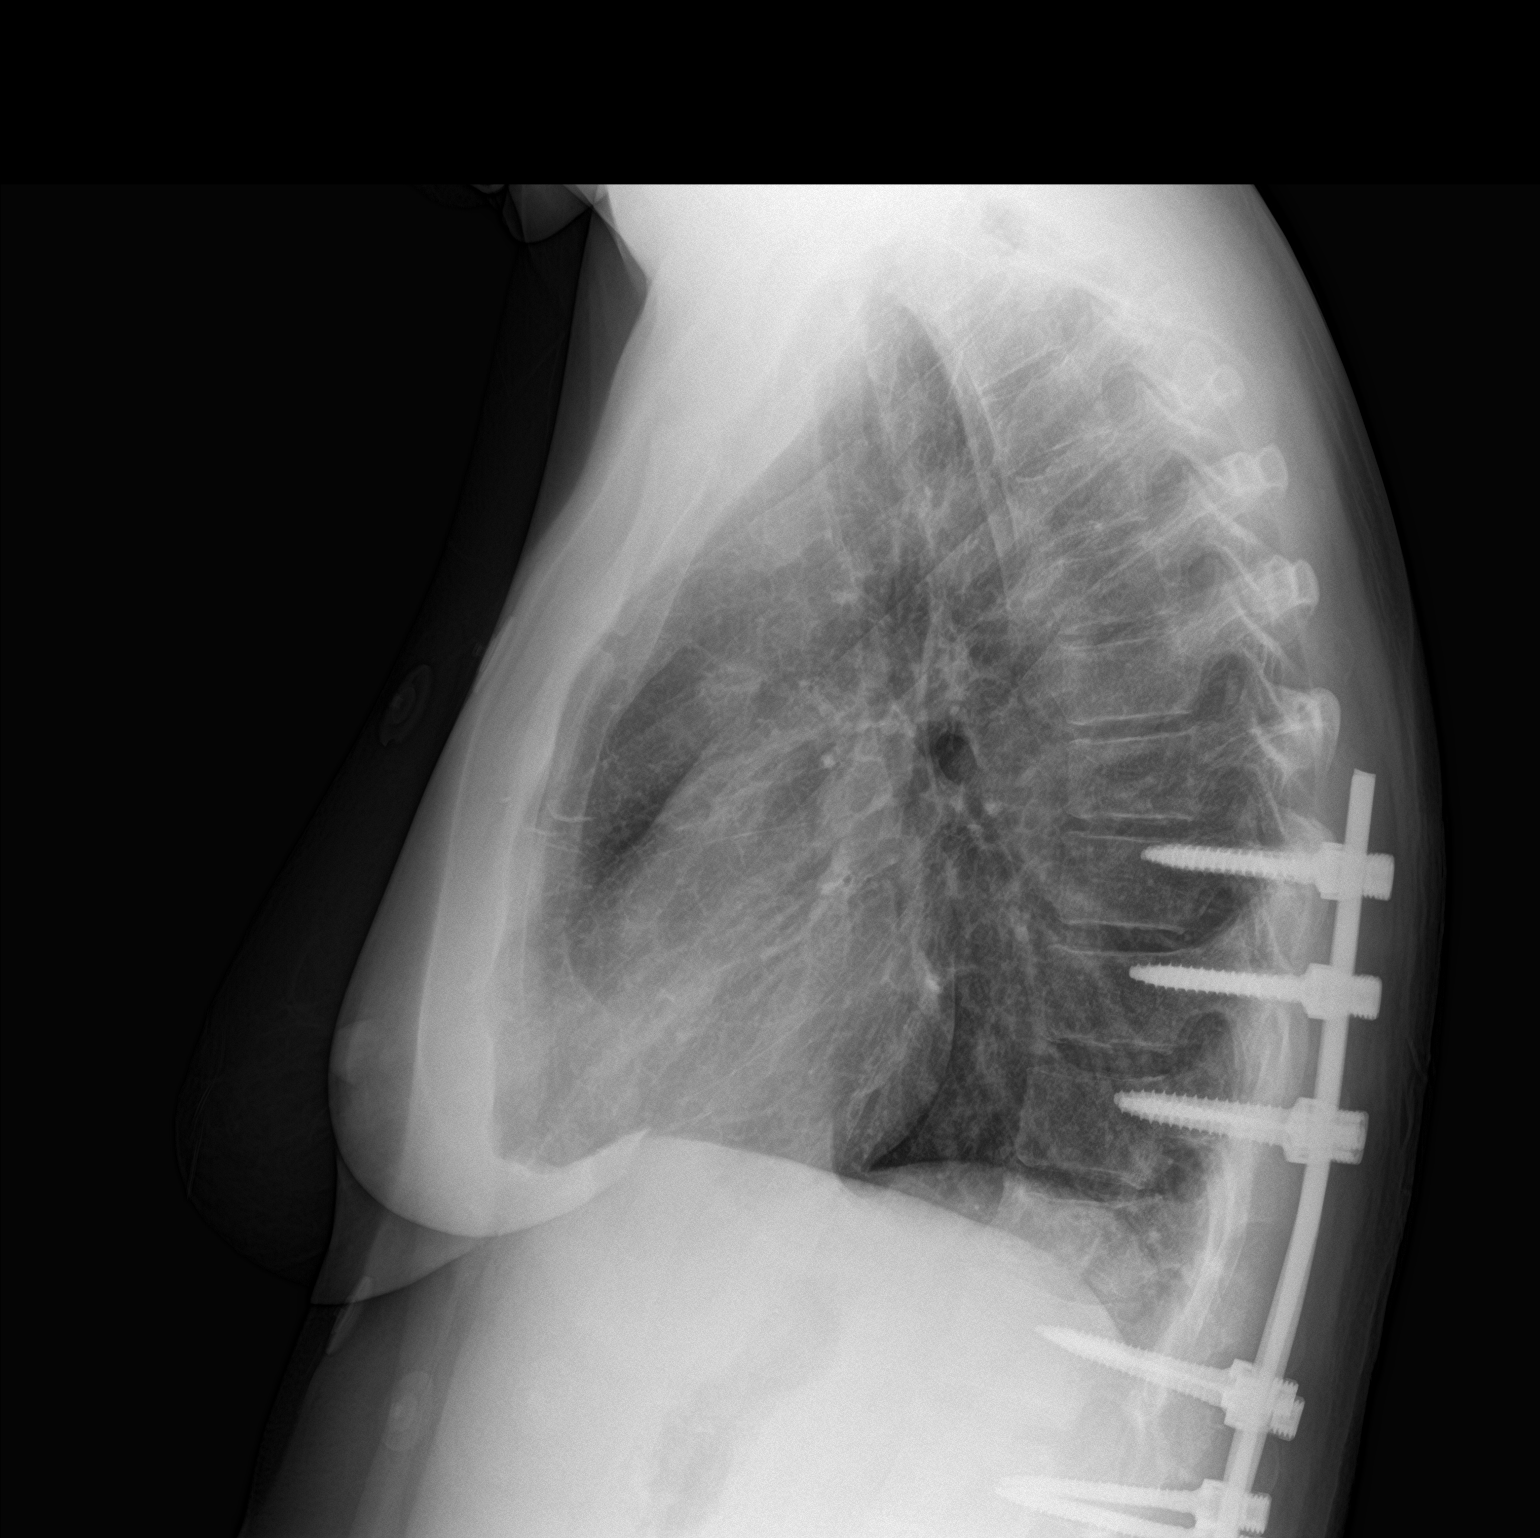

[2 of 2 positions shown; findings below may reference images not displayed]

FINDINGS: The lungs are clear. There is no pleural effusion or pneumothorax.
The cardiac silhouette is within normal limits. Multiple old healed
right posterior rib fractures. There is age indeterminate fracture
of the right lateral sixth rib. There is also fracture of the
lateral aspect of the more inferior ribs, possibly the eighth rib.
Soft tissue prominence of the right lateral chest wall may represent
a small hematoma. The bones are osteopenic for the patient's age.
Thoracolumbar posterior fixation hardware. There is age
indeterminate compression deformity of a lower thoracic vertebra
seen on the lateral projection.
IMPRESSION: No acute cardiopulmonary process.

Multiple age indeterminate right lateral rib fractures as described.
Correlation with clinical exam and point tenderness recommended.

Age indeterminate compression deformity of a lower thoracic
vertebra.

## 2017-07-13 ENCOUNTER — Encounter: Payer: Self-pay | Admitting: Physician Assistant

## 2017-07-15 NOTE — Telephone Encounter (Signed)
Please contact this patient to schedule a visit with me regarding her shoulder pain.

## 2017-08-20 NOTE — Telephone Encounter (Signed)
Error. Close encounter

## 2017-08-25 ENCOUNTER — Other Ambulatory Visit: Payer: Self-pay | Admitting: Physician Assistant

## 2017-08-25 DIAGNOSIS — G894 Chronic pain syndrome: Secondary | ICD-10-CM

## 2017-08-26 NOTE — Telephone Encounter (Signed)
See attached.  Pain med and muscle relaxer

## 2017-08-26 NOTE — Telephone Encounter (Signed)
Meds ordered this encounter  Medications  . ibuprofen (ADVIL,MOTRIN) 800 MG tablet    Sig: TAKE 1 TABLET BY MOUTH EVERY 8 HOURS AS NEEDED    Dispense:  270 tablet    Refill:  0  . tiZANidine (ZANAFLEX) 4 MG tablet    Sig: TAKE 3 TABLETS BY MOUTH 3 TIMES A DAY    Dispense:  810 tablet    Refill:  0

## 2017-10-27 ENCOUNTER — Other Ambulatory Visit: Payer: Self-pay | Admitting: Physician Assistant

## 2017-10-27 DIAGNOSIS — G894 Chronic pain syndrome: Secondary | ICD-10-CM

## 2017-10-27 NOTE — Telephone Encounter (Signed)
Refill of Lidoderm patches  LOV 8/18 18  LRF 04/26/17   #90 patches   1 refill  [id number] - [place] [place] - [address] AT [facility]

## 2017-11-03 NOTE — Telephone Encounter (Signed)
mychart message sent to pt about scheduling an apt

## 2017-11-03 NOTE — Telephone Encounter (Signed)
[name],  Do you want to see this patient before we refill?

## 2017-11-03 NOTE — Telephone Encounter (Signed)
Tried to call pt to let her know of the refill but one number was wrong and the other just rang busy. Could not leave message.

## 2017-11-03 NOTE — Telephone Encounter (Signed)
Please send patient a My Chart message, asking that she call us to schedule.

## 2017-11-03 NOTE — Telephone Encounter (Signed)
Please advise this patient that I sent the refill of lidocaine patches, and that she is due for follow-up with me.

## 2017-11-12 ENCOUNTER — Encounter: Payer: Self-pay | Admitting: Physician Assistant

## 2017-11-25 ENCOUNTER — Encounter: Payer: Self-pay | Admitting: Physician Assistant

## 2017-11-28 ENCOUNTER — Other Ambulatory Visit: Payer: Self-pay

## 2017-11-28 ENCOUNTER — Other Ambulatory Visit: Payer: Self-pay | Admitting: Physician Assistant

## 2017-11-28 DIAGNOSIS — G894 Chronic pain syndrome: Secondary | ICD-10-CM

## 2017-11-28 MED ORDER — IBUPROFEN 800 MG PO TABS: 800.0000 mg | ORAL_TABLET | Freq: Three times a day (TID) | ORAL | 0 refills | Status: DC | PRN

## 2017-11-29 ENCOUNTER — Encounter: Payer: Self-pay | Admitting: Physician Assistant

## 2017-12-03 ENCOUNTER — Other Ambulatory Visit: Payer: Self-pay | Admitting: Physician Assistant

## 2017-12-03 DIAGNOSIS — G894 Chronic pain syndrome: Secondary | ICD-10-CM

## 2017-12-03 NOTE — Telephone Encounter (Signed)
Patient is requesting a refill of the following medications: Requested Prescriptions   Pending Prescriptions Disp Refills  . tiZANidine (ZANAFLEX) 4 MG tablet [Pharmacy Med Name: TIZANIDINE HCL 4 MG TABLET] 810 tablet 0    Sig: TAKE 3 TABLETS BY MOUTH 3 TIMES A DAY    Date of patient request: 12/03/16 Last office visit: 04/26/17 Date of last refill: 08/26/17  Last refill amount: #818 0RF Follow up time period per chart: none sched

## 2017-12-06 ENCOUNTER — Encounter: Payer: Self-pay | Admitting: Physician Assistant

## 2017-12-06 ENCOUNTER — Other Ambulatory Visit: Payer: Self-pay

## 2017-12-06 ENCOUNTER — Ambulatory Visit: Payer: BLUE CROSS/BLUE SHIELD | Admitting: Physician Assistant

## 2017-12-06 VITALS — BP 102/76 | HR 96 | Temp 97.9°F | Resp 12 | Ht 66.81 in | Wt 162.8 lb

## 2017-12-06 DIAGNOSIS — N946 Dysmenorrhea, unspecified: Secondary | ICD-10-CM

## 2017-12-06 DIAGNOSIS — R296 Repeated falls: Secondary | ICD-10-CM | POA: Diagnosis not present

## 2017-12-06 DIAGNOSIS — K219 Gastro-esophageal reflux disease without esophagitis: Secondary | ICD-10-CM | POA: Diagnosis not present

## 2017-12-06 DIAGNOSIS — G8929 Other chronic pain: Secondary | ICD-10-CM

## 2017-12-06 DIAGNOSIS — Z9109 Other allergy status, other than to drugs and biological substances: Secondary | ICD-10-CM | POA: Diagnosis not present

## 2017-12-06 DIAGNOSIS — M549 Dorsalgia, unspecified: Secondary | ICD-10-CM | POA: Diagnosis not present

## 2017-12-06 DIAGNOSIS — F3281 Premenstrual dysphoric disorder: Secondary | ICD-10-CM | POA: Diagnosis not present

## 2017-12-06 DIAGNOSIS — R413 Other amnesia: Secondary | ICD-10-CM | POA: Diagnosis not present

## 2017-12-06 DIAGNOSIS — M25512 Pain in left shoulder: Secondary | ICD-10-CM

## 2017-12-06 DIAGNOSIS — R300 Dysuria: Secondary | ICD-10-CM

## 2017-12-06 DIAGNOSIS — G43009 Migraine without aura, not intractable, without status migrainosus: Secondary | ICD-10-CM

## 2017-12-06 LAB — POCT URINALYSIS DIP (MANUAL ENTRY)
Bilirubin, UA: NEGATIVE
GLUCOSE UA: NEGATIVE mg/dL
NITRITE UA: NEGATIVE
Protein Ur, POC: NEGATIVE mg/dL
Spec Grav, UA: 1.025 (ref 1.010–1.025)
Urobilinogen, UA: 0.2 E.U./dL
pH, UA: 5.5 (ref 5.0–8.0)

## 2017-12-06 LAB — POC MICROSCOPIC URINALYSIS (UMFC): Mucus: ABSENT

## 2017-12-06 MED ORDER — LIDOCAINE 5 % EX PTCH: 3.0000 | MEDICATED_PATCH | CUTANEOUS | 3 refills | Status: AC

## 2017-12-06 MED ORDER — AZELASTINE HCL 0.1 % NA SOLN: 2.0000 | Freq: Two times a day (BID) | NASAL | 3 refills | Status: DC

## 2017-12-06 MED ORDER — DEXLANSOPRAZOLE 60 MG PO CPDR: 60.0000 mg | DELAYED_RELEASE_CAPSULE | Freq: Every day | ORAL | 3 refills | Status: DC

## 2017-12-06 NOTE — Assessment & Plan Note (Signed)
While this pain is chronic, it is not the cause for her episodic disability.

## 2017-12-06 NOTE — Assessment & Plan Note (Signed)
Uncontrolled having run out of PPI. Dexilant works best.

## 2017-12-06 NOTE — Assessment & Plan Note (Signed)
Long-standing. Unable to afford ablation vs hysterectomy.

## 2017-12-06 NOTE — Assessment & Plan Note (Signed)
Stable

## 2017-12-06 NOTE — Patient Instructions (Signed)
     IF you received an x-ray today, you will receive an invoice from [facility]. Please contact [facility] at [phone] with questions or concerns regarding your invoice.   IF you received labwork today, you will receive an invoice from [facility]. Please contact [facility] at [phone] with questions or concerns regarding your invoice.   Our billing staff will not be able to assist you with questions regarding bills from these companies.  You will be contacted with the lab results as soon as they are available. The fastest way to get your results is to activate your My Chart account. Instructions are located on the last page of this paperwork. If you have not heard from us regarding the results in 2 weeks, please contact this office.     

## 2017-12-06 NOTE — Assessment & Plan Note (Signed)
While this pain is chronic, episodic flares result in disability.

## 2017-12-06 NOTE — Assessment & Plan Note (Signed)
Has not been able to afford hysterectomy vs ablation.

## 2017-12-06 NOTE — Assessment & Plan Note (Signed)
Not addressed today, as it was brought up at the end of the visit. She left Pain Management, so is unable to schedule with the neurologist there for evaluation of this problem. Will likely need referral to alternate neurology office, but finances and transportation will be substantial obstacles.

## 2017-12-06 NOTE — Assessment & Plan Note (Signed)
Due to loss of balance/instability following fall in which she hit her head. Falls have resulted in need for ED visits and injuries requiring time out of work.
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
## 2017-12-09 LAB — URINE CULTURE

## 2017-12-09 LAB — GC/CHLAMYDIA PROBE AMP
CHLAMYDIA, DNA PROBE: NEGATIVE
NEISSERIA GONORRHOEAE BY PCR: NEGATIVE

## 2017-12-10 ENCOUNTER — Ambulatory Visit: Payer: BLUE CROSS/BLUE SHIELD | Admitting: Physician Assistant

## 2017-12-11 ENCOUNTER — Encounter: Payer: Self-pay | Admitting: Physician Assistant

## 2017-12-12 MED ORDER — NITROFURANTOIN MONOHYD MACRO 100 MG PO CAPS: 100.0000 mg | ORAL_CAPSULE | Freq: Two times a day (BID) | ORAL | 0 refills | Status: DC

## 2017-12-12 NOTE — Addendum Note (Signed)
Addended by: Fara Chute on: 12/12/2017 01:29 PM   Modules accepted: Orders

## 2018-01-14 ENCOUNTER — Other Ambulatory Visit: Payer: Self-pay | Admitting: Physician Assistant

## 2018-01-30 ENCOUNTER — Other Ambulatory Visit: Payer: Self-pay

## 2018-01-30 ENCOUNTER — Encounter (HOSPITAL_COMMUNITY): Payer: Self-pay

## 2018-01-30 ENCOUNTER — Emergency Department (HOSPITAL_COMMUNITY)
Admission: EM | Admit: 2018-01-30 | Discharge: 2018-01-31 | Disposition: A | Discharge status: Home / Self Care | Payer: BLUE CROSS/BLUE SHIELD | Attending: Emergency Medicine | Admitting: Emergency Medicine

## 2018-01-30 DIAGNOSIS — R5383 Other fatigue: Secondary | ICD-10-CM

## 2018-01-30 DIAGNOSIS — Z79899 Other long term (current) drug therapy: Secondary | ICD-10-CM | POA: Insufficient documentation

## 2018-01-30 DIAGNOSIS — J45909 Unspecified asthma, uncomplicated: Secondary | ICD-10-CM | POA: Insufficient documentation

## 2018-01-30 DIAGNOSIS — R410 Disorientation, unspecified: Secondary | ICD-10-CM | POA: Insufficient documentation

## 2018-01-30 DIAGNOSIS — E039 Hypothyroidism, unspecified: Secondary | ICD-10-CM | POA: Insufficient documentation

## 2018-01-30 DIAGNOSIS — M6281 Muscle weakness (generalized): Secondary | ICD-10-CM | POA: Insufficient documentation

## 2018-01-30 DIAGNOSIS — F17210 Nicotine dependence, cigarettes, uncomplicated: Secondary | ICD-10-CM | POA: Insufficient documentation

## 2018-01-30 LAB — BASIC METABOLIC PANEL
Anion gap: 9 (ref 5–15)
BUN: 17 mg/dL (ref 6–20)
CHLORIDE: 100 mmol/L — AB (ref 101–111)
CO2: 29 mmol/L (ref 22–32)
Calcium: 7.4 mg/dL — ABNORMAL LOW (ref 8.9–10.3)
Creatinine, Ser: 0.84 mg/dL (ref 0.44–1.00)
GFR calc Af Amer: >60 mL/min (ref >60)
GFR calc non Af Amer: >60 mL/min (ref >60)
GLUCOSE: 101 mg/dL — AB (ref 65–99)
Potassium: 3.1 mmol/L — ABNORMAL LOW (ref 3.5–5.1)
Sodium: 138 mmol/L (ref 135–145)

## 2018-01-30 LAB — CBG MONITORING, ED: Glucose-Capillary: 101 mg/dL — ABNORMAL HIGH (ref 65–99)

## 2018-01-30 LAB — CBC
HCT: 33.2 % — ABNORMAL LOW (ref 36.0–46.0)
Hemoglobin: 10.9 g/dL — ABNORMAL LOW (ref 12.0–15.0)
MCH: 27.3 pg (ref 26.0–34.0)
MCHC: 32.8 g/dL (ref 30.0–36.0)
MCV: 83 fL (ref 78.0–100.0)
Platelets: 66 10*3/uL — ABNORMAL LOW (ref 150–400)
RBC: 4 MIL/uL (ref 3.87–5.11)
RDW: 17.1 % — AB (ref 11.5–15.5)
WBC: 4.1 10*3/uL (ref 4.0–10.5)

## 2018-01-30 LAB — I-STAT BETA HCG BLOOD, ED (MC, WL, AP ONLY): I-stat hCG, quantitative: <5 m[IU]/mL (ref <5)

## 2018-01-30 LAB — ETHANOL: Alcohol, Ethyl (B): <10 mg/dL (ref <10)

## 2018-01-30 MED ORDER — KETOROLAC TROMETHAMINE 30 MG/ML IJ SOLN
30.0000 mg | Freq: Once | INTRAMUSCULAR | Status: AC
  Administered 2018-01-30: 30 mg via INTRAVENOUS
  Filled 2018-01-30: qty 1

## 2018-01-30 MED ORDER — SODIUM CHLORIDE 0.9 % IV BOLUS
1000.0000 mL | Freq: Once | INTRAVENOUS | Status: AC
  Administered 2018-01-30: 1000 mL via INTRAVENOUS

## 2018-01-30 NOTE — ED Provider Notes (Signed)
[facility] EMERGENCY DEPARTMENT Provider Note   CSN: [id number] Arrival date & time: 01/30/18  2043     History   Chief Complaint Chief Complaint  Patient presents with  . Fatigue    HPI Barbara Rogers is a 37 y.o. female.  This patient is a 37 year old female with past medical history of anxiety, depression, seizures, and polysubstance abuse.  She presents today for evaluation of weakness and disorientation.  She states that this started earlier this morning.  She denies any injury, trauma, fever, chills, or other complaints.  She states that she feels very drowsy and has difficulty recalling simple details from her day.  She denies any drug or alcohol use today.  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  The history is provided by the patient.    Past Medical History:  Diagnosis Date  . AKI (acute kidney injury) ([place]) 01/18/2015  . Allergy   . Anxiety   . Asthma    exacerbated by bronchitisi  . Asthma   . C3 cervical fracture ([place])   . C3 cervical fracture ([place]) 01/29/2016  . Cervical transverse process fracture ([place])   . Chronic left shoulder pain 08/2012  . Clavicle fracture   . Concussion 01/29/2016  . Depression   . Drug psychosis with hallucinations ([place]) 01/20/2015  . Dysmenorrhea   . Dysphagia   . Finger fracture, left 10/31/2012   LEFT 4th finger  . Insomnia   . Lumbar transverse process fracture ([place])   . MDD (major depressive disorder), recurrent episode, severe ([place]) 06/30/2016  . MDD (major depressive disorder), recurrent, severe, with psychosis ([place]) 01/19/2015  . Migraine without aura   . Mood disorder ([place])   . Nephrolithiasis   . OD (overdose of drug), intentional self-harm, initial encounter ([place]) 06/26/2016  . PMDD (premenstrual dysphoric disorder)   . Respiratory failure ([place])   . Seizure-like activity ([place]) 06/30/2016   normal EEG, question pseudoseizures  . T12 burst fracture ([place])   . Traumatic  hemo-pneumothorax   . Traumatic hemopneumothorax 02/09/2016    Patient Active Problem List   Diagnosis Date Noted  . Multiple falls 12/06/2017  . Short-term memory loss 12/06/2017  . GERD (gastroesophageal reflux disease) 12/06/2017  . Chronic back pain greater than 3 months duration 01/13/2017  . Polysubstance (excluding opioids) dependence, daily use ([place]) 07/01/2016  . Seizure-like activity ([place]) 06/29/2016  . MDD (major depressive disorder), recurrent severe, without psychosis ([place]) 06/27/2016  . Suicidal ideation 06/26/2016  . Bradycardia 06/26/2016  . Tobacco abuse   . Hypokalemia 01/18/2015  . Migraine without aura   . IUD (intrauterine device) in place 06/02/2013  . Hypothyroid 12/09/2012  . Chronic left shoulder pain   . Mood disorder ([place]) 03/25/2012  . AR (allergic rhinitis) 03/25/2012  . Dysmenorrhea 03/25/2012  . PMDD (premenstrual dysphoric disorder) 03/25/2012  . Chronic insomnia 03/25/2012    Past Surgical History:  Procedure Laterality Date  . FRACTURE SURGERY    . POSTERIOR LUMBAR FUSION 4 LEVEL N/A 01/30/2016   Procedure: T9-L2 Posterior Stabilization, Posterior Lumbar Fusion with Pedicle Screws;  Surgeon: Kary Kos, MD;  Location: [facility];  Service: Neurosurgery;  Laterality: N/A;     OB History    Gravida  3   Para  0   Term  0   Preterm  0   AB  3   Living        SAB  0  TAB  3   Ectopic  0   Multiple      Live Births               Home Medications    Prior to Admission medications   Medication Sig Start Date End Date Taking? Authorizing Provider  albuterol (VENTOLIN HFA) 108 (90 Base) MCG/ACT inhaler TAKE 2 PUFFS EVERY 4 HOURS AS NEEDED 04/26/17  Yes Jeffery, [name], PA-C  azelastine (ASTELIN) 0.1 % nasal spray Place 2 sprays into both nostrils 2 (two) times daily. Use in each nostril as directed 12/06/17  Yes Jeffery, [name], PA-C  dexlansoprazole (DEXILANT) 60 MG capsule Take 1 capsule (60 mg total) by mouth daily.  12/06/17  Yes Jeffery, [name], PA-C  divalproex (DEPAKOTE ER) 500 MG 24 hr tablet Take 1,000 mg by mouth at bedtime.   Yes [provider]  gabapentin (NEURONTIN) 400 MG capsule TAKE 2 CAPSULES (800 MG TOTAL) BY MOUTH 3 (THREE) TIMES DAILY. 01/14/18  Yes Jeffery, [name], PA-C  ibuprofen (ADVIL,MOTRIN) 800 MG tablet Take 1 tablet (800 mg total) by mouth every 8 (eight) hours as needed. 11/28/17  Yes Jeffery, Domingo Mend, PA-C  levonorgestrel (MIRENA) 20 MCG/24HR IUD 1 each by Intrauterine route once.   Yes [provider]  levothyroxine (SYNTHROID, LEVOTHROID) 50 MCG tablet TAKE 1 TABLET BY MOUTH EVERY DAY 11/04/16  Yes Jeffery, [name], PA-C  lidocaine (LIDODERM) 5 % Place 3 patches onto the skin daily. Remove & Discard patch within 12 hours or as directed by provider 12/06/17  Yes Jacqulynn Cadet, [name], PA-C  ondansetron (ZOFRAN) 8 MG tablet  11/11/16  Yes [provider]  tiZANidine (ZANAFLEX) 4 MG tablet TAKE 3 TABLETS BY MOUTH 3 TIMES A DAY 12/04/17  Yes Jeffery, [name], PA-C  traZODone (DESYREL) 150 MG tablet Take 150 mg by mouth at bedtime as needed. 04/12/17  Yes [provider]  zolpidem (AMBIEN) 10 MG tablet Take 5-10 mg by mouth at bedtime as needed for sleep.   Yes [provider]  ALPRAZolam Duanne Moron) 1 MG tablet Take 1 mg by mouth 4 (four) times daily.    [provider]  amphetamine-dextroamphetamine (ADDERALL) 20 MG tablet Take 20 mg by mouth 3 (three) times daily.    [provider]  Asenapine Maleate 10 MG SUBL Place 20 mg under the tongue at bedtime and may repeat dose one time if needed.    [provider]  gabapentin (NEURONTIN) 400 MG capsule Take 400 mg by mouth daily.  10/14/17   [provider]  nitrofurantoin, macrocrystal-monohydrate, (MACROBID) 100 MG capsule Take 1 capsule (100 mg total) by mouth 2 (two) times daily. Patient not taking: Reported on 01/30/2018 12/12/17   Harrison Mons, PA-C    Family History Family  History  Problem Relation Age of Onset  . Migraines Mother   . Mental illness Mother   . Heart disease Father   . Alcohol abuse Father   . Arthritis Maternal Grandmother   . Thyroid disease Maternal Grandmother   . Heart disease Maternal Grandfather        AMI 1996, 2014  . Anemia Maternal Grandfather        bone marrow dysfunction    Social History Social History   Tobacco Use  . Smoking status: Current Every Day Smoker    Packs/day: 1.00    Types: E-cigarettes  . Smokeless tobacco: Never Used  Substance Use Topics  . Alcohol use: Yes    Alcohol/week: 0.0 oz    Comment: occasionally  .  Drug use: Yes    Types: Marijuana     Allergies   Penicillins; Progestins; Progestins; Sinequan [doxepin]; and Sinequan [doxepin]   Review of Systems Review of Systems  All other systems reviewed and are negative.    Physical Exam Updated Vital Signs BP 101/65   Pulse (!) 104   Temp 99.9 F (37.7 C) (Oral)   Resp 11   Ht 5\' 8"  (1.727 m)   Wt 72.6 kg (160 lb)   SpO2 95%   BMI 24.33 kg/m   Physical Exam  Constitutional: She is oriented to person, place, and time. She appears well-developed and well-nourished. No distress.  HENT:  Head: Normocephalic and atraumatic.  Mouth/Throat: Oropharynx is clear and moist.  Eyes: Pupils are equal, round, and reactive to light. EOM are normal.  Neck: Normal range of motion. Neck supple.  Cardiovascular: Normal rate and regular rhythm. Exam reveals no gallop and no friction rub.  No murmur heard. Pulmonary/Chest: Effort normal and breath sounds normal. No respiratory distress. She has no wheezes.  Abdominal: Soft. Bowel sounds are normal. She exhibits no distension. There is no tenderness.  Musculoskeletal: Normal range of motion.  Neurological: She is alert and oriented to person, place, and time. No cranial nerve deficit. She exhibits normal muscle tone. Coordination normal.  Skin: Skin is warm and dry. She is not diaphoretic.    Nursing note and vitals reviewed.    ED Treatments / Results  Labs (all labs ordered are listed, but only abnormal results are displayed) Labs Reviewed  BASIC METABOLIC PANEL - Abnormal; Notable for the following components:      Result Value   Potassium 3.1 (*)    Chloride 100 (*)    Glucose, Bld 101 (*)    Calcium 7.4 (*)    All other components within normal limits  CBC - Abnormal; Notable for the following components:   Hemoglobin 10.9 (*)    HCT 33.2 (*)    RDW 17.1 (*)    Platelets 66 (*)    All other components within normal limits  CBG MONITORING, ED - Abnormal; Notable for the following components:   Glucose-Capillary 101 (*)    All other components within normal limits  URINALYSIS, ROUTINE W REFLEX MICROSCOPIC  VALPROIC ACID LEVEL  I-STAT BETA HCG BLOOD, ED ([facility], [facility], [facility] ONLY)    EKG EKG Interpretation  Date/Time:  Friday Jan 30 2018 20:46:17 EDT Ventricular Rate:  115 PR Interval:    QRS Duration: 87 QT Interval:  317 QTC Calculation: 439 R Axis:   85 Text Interpretation:  Sinus tachycardia Nonspecific T wave abnormality No specific change from prior ecg Confirmed by Veryl Speak [phone]) on 01/30/2018 11:08:12 PM   Radiology No results found.  Procedures Procedures (including critical care time)  Medications Ordered in ED Medications  sodium chloride 0.9 % bolus 1,000 mL (has no administration in time range)  sodium chloride 0.9 % bolus 1,000 mL (has no administration in time range)  ketorolac (TORADOL) 30 MG/ML injection 30 mg (has no administration in time range)  sodium chloride 0.9 % bolus 1,000 mL (0 mLs Intravenous Stopped 01/30/18 2137)     Initial Impression / Assessment and Plan / ED Course  I have reviewed the triage vital signs and the nursing notes.  Pertinent labs & imaging results that were available during my care of the patient were reviewed by me and considered in my medical decision making (see chart for details).  Patient  presents here with complaints  of weakness and confusion, the etiology of which I am uncertain.  Her neurologic exam is nonfocal and laboratory studies are unremarkable.  She was given IV fluids because she felt as though she may be dehydrated from working in a [employer].  She is requested on several occasions to be admitted, however I see no medical reason for this.  She has told me several times that she has no food at home and no one to look after her, and has no family and no one to pick her up.  I explained that this was not a reason for her to be admitted, but I do not believe this was well received.  Either way, she will be discharged, to follow-up as needed.  Final Clinical Impressions(s) / ED Diagnoses   Final diagnoses:  None    ED Discharge Orders    None       Veryl Speak, MD 01/31/18 0221

## 2018-01-30 NOTE — ED Triage Notes (Signed)
Pt stated that today about 1500hrs was feeling tired, she works at [employer] and thinks it could had been the heat.

## 2018-01-30 NOTE — ED Notes (Signed)
Patient seems to be confused at this time. When asking patient how much she weighs patient states that she is 560lbs.

## 2018-01-31 LAB — URINALYSIS, ROUTINE W REFLEX MICROSCOPIC
Bacteria, UA: NONE SEEN
Bilirubin Urine: NEGATIVE
GLUCOSE, UA: NEGATIVE mg/dL
Ketones, ur: 20 mg/dL — AB
Leukocytes, UA: NEGATIVE
Nitrite: NEGATIVE
PH: 6 (ref 5.0–8.0)
PROTEIN: NEGATIVE mg/dL
SPECIFIC GRAVITY, URINE: 1.019 (ref 1.005–1.030)

## 2018-01-31 LAB — VALPROIC ACID LEVEL: Valproic Acid Lvl: <1 ug/mL — ABNORMAL LOW (ref 50.0–100.0)

## 2018-01-31 LAB — RAPID URINE DRUG SCREEN, HOSP PERFORMED
Amphetamines: NOT DETECTED
BENZODIAZEPINES: POSITIVE — AB
Barbiturates: NOT DETECTED
COCAINE: NOT DETECTED
Opiates: NOT DETECTED
Tetrahydrocannabinol: POSITIVE — AB

## 2018-01-31 NOTE — Discharge Instructions (Addendum)
Drink plenty of fluids and get plenty of rest.  Continue your medications as previously prescribed.  Up with your primary doctor in the next week if symptoms are not improving.

## 2018-02-25 ENCOUNTER — Other Ambulatory Visit: Payer: Self-pay | Admitting: Physician Assistant

## 2018-02-25 DIAGNOSIS — G894 Chronic pain syndrome: Secondary | ICD-10-CM

## 2018-02-25 NOTE — Telephone Encounter (Signed)
Patient is requesting a refill of the following medications: Requested Prescriptions   Pending Prescriptions Disp Refills  . ibuprofen (ADVIL,MOTRIN) 800 MG tablet [Pharmacy Med Name: IBUPROFEN 800 MG TABLET] 270 tablet 0    Sig: TAKE 1 TABLET BY MOUTH EVERY 8 HOURS AS NEEDED  . tiZANidine (ZANAFLEX) 4 MG tablet [Pharmacy Med Name: TIZANIDINE HCL 4 MG TABLET] 810 tablet 0    Sig: TAKE 3 TABLETS BY MOUTH 3 TIMES A DAY    Date of patient request:02/25/18 Last office visit: 12/06/17 Date of last refill: ibuprofen-11/28/17, zanaflex 12/04/17 Last refill amount: #270 ibuprofen, #810-Zanaflex No refills Follow up time period per chart: none scheduled

## 2018-02-27 ENCOUNTER — Other Ambulatory Visit: Payer: Self-pay | Admitting: Physician Assistant

## 2018-02-27 DIAGNOSIS — J309 Allergic rhinitis, unspecified: Secondary | ICD-10-CM

## 2018-02-28 ENCOUNTER — Other Ambulatory Visit: Payer: Self-pay | Admitting: Physician Assistant

## 2018-02-28 ENCOUNTER — Other Ambulatory Visit: Payer: Self-pay

## 2018-02-28 DIAGNOSIS — J309 Allergic rhinitis, unspecified: Secondary | ICD-10-CM

## 2018-03-04 ENCOUNTER — Emergency Department (HOSPITAL_COMMUNITY)
Admission: EM | Admit: 2018-03-04 | Discharge: 2018-03-04 | Disposition: A | Discharge status: Home / Self Care | Payer: BLUE CROSS/BLUE SHIELD | Attending: Emergency Medicine | Admitting: Emergency Medicine

## 2018-03-04 ENCOUNTER — Encounter (HOSPITAL_COMMUNITY): Payer: Self-pay

## 2018-03-04 ENCOUNTER — Other Ambulatory Visit: Payer: Self-pay

## 2018-03-04 DIAGNOSIS — E039 Hypothyroidism, unspecified: Secondary | ICD-10-CM | POA: Diagnosis not present

## 2018-03-04 DIAGNOSIS — R55 Syncope and collapse: Secondary | ICD-10-CM

## 2018-03-04 DIAGNOSIS — Z79899 Other long term (current) drug therapy: Secondary | ICD-10-CM | POA: Diagnosis not present

## 2018-03-04 DIAGNOSIS — F17210 Nicotine dependence, cigarettes, uncomplicated: Secondary | ICD-10-CM | POA: Insufficient documentation

## 2018-03-04 DIAGNOSIS — R42 Dizziness and giddiness: Secondary | ICD-10-CM | POA: Diagnosis present

## 2018-03-04 DIAGNOSIS — I959 Hypotension, unspecified: Secondary | ICD-10-CM | POA: Insufficient documentation

## 2018-03-04 DIAGNOSIS — J45909 Unspecified asthma, uncomplicated: Secondary | ICD-10-CM | POA: Diagnosis not present

## 2018-03-04 DIAGNOSIS — Z9181 History of falling: Secondary | ICD-10-CM | POA: Insufficient documentation

## 2018-03-04 LAB — BASIC METABOLIC PANEL
Anion gap: 7 (ref 5–15)
BUN: 10 mg/dL (ref 6–20)
CO2: 24 mmol/L (ref 22–32)
Calcium: 8.9 mg/dL (ref 8.9–10.3)
Chloride: 104 mmol/L (ref 98–111)
Creatinine, Ser: 0.67 mg/dL (ref 0.44–1.00)
GFR calc Af Amer: >60 mL/min (ref >60)
GFR calc non Af Amer: >60 mL/min (ref >60)
Glucose, Bld: 104 mg/dL — ABNORMAL HIGH (ref 70–99)
Potassium: 3.8 mmol/L (ref 3.5–5.1)
Sodium: 135 mmol/L (ref 135–145)

## 2018-03-04 LAB — CBC
HCT: 35.1 % — ABNORMAL LOW (ref 36.0–46.0)
Hemoglobin: 11.6 g/dL — ABNORMAL LOW (ref 12.0–15.0)
MCH: 29 pg (ref 26.0–34.0)
MCHC: 33 g/dL (ref 30.0–36.0)
MCV: 87.8 fL (ref 78.0–100.0)
Platelets: 192 10*3/uL (ref 150–400)
RBC: 4 MIL/uL (ref 3.87–5.11)
RDW: 17.2 % — ABNORMAL HIGH (ref 11.5–15.5)
WBC: 5 10*3/uL (ref 4.0–10.5)

## 2018-03-04 LAB — URINALYSIS, ROUTINE W REFLEX MICROSCOPIC
Bacteria, UA: NONE SEEN
Bilirubin Urine: NEGATIVE
Glucose, UA: NEGATIVE mg/dL
Ketones, ur: 5 mg/dL — AB
Leukocytes, UA: NEGATIVE
Nitrite: NEGATIVE
Protein, ur: NEGATIVE mg/dL
Specific Gravity, Urine: 1.02 (ref 1.005–1.030)
pH: 7 (ref 5.0–8.0)

## 2018-03-04 LAB — I-STAT BETA HCG BLOOD, ED (MC, WL, AP ONLY): I-stat hCG, quantitative: 10.2 m[IU]/mL — ABNORMAL HIGH (ref <5)

## 2018-03-04 MED ORDER — ALPRAZOLAM 0.5 MG PO TABS
0.5000 mg | ORAL_TABLET | Freq: Once | ORAL | Status: AC
  Administered 2018-03-04: 0.5 mg via ORAL
  Filled 2018-03-04: qty 1

## 2018-03-04 MED ORDER — SODIUM CHLORIDE 0.9 % IV BOLUS
1000.0000 mL | Freq: Once | INTRAVENOUS | Status: AC
  Administered 2018-03-04: 1000 mL via INTRAVENOUS

## 2018-03-04 NOTE — ED Provider Notes (Signed)
[facility] EMERGENCY DEPARTMENT Provider Note   CSN: [id number] Arrival date & time: 03/04/18  1332     History   Chief Complaint Chief Complaint  Patient presents with  . hypotensive    HPI Barbara Rogers is a 37 y.o. female.  HPI   37 year old female presenting after falls.  She says she feels lightheaded and has had multiple near syncopal events.  She says she is actually had these for multiple months.  Worse recently.  She states she has been eating and drinking well.  She denies any acute pain.  No acute respiratory complaints.  She is on multiple sedating medicines.  Progress her pain medicine for various pain complaints which are chronic.  Past Medical History:  Diagnosis Date  . AKI (acute kidney injury) ([place]) 01/18/2015  . Allergy   . Anxiety   . Asthma    exacerbated by bronchitisi  . Asthma   . C3 cervical fracture ([place])   . C3 cervical fracture ([place]) 01/29/2016  . Cervical transverse process fracture ([place])   . Chronic left shoulder pain 08/2012  . Clavicle fracture   . Concussion 01/29/2016  . Depression   . 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 Dysmenorrhea   . Dysphagia   . Finger fracture, left 10/31/2012   LEFT 4th finger  . Insomnia   . Lumbar transverse process fracture ([place])   . MDD (major depressive disorder), recurrent episode, severe ([place]) 06/30/2016  . 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 Migraine without aura   . Mood disorder ([place])   . Nephrolithiasis   . OD (overdose of drug), intentional self-harm, initial encounter ([place]) 06/26/2016  . PMDD (premenstrual dysphoric disorder)   . Respiratory failure ([place])   . Seizure-like activity ([place]) 06/30/2016   normal EEG, question pseudoseizures  . T12 burst fracture ([place])   . Traumatic hemo-pneumothorax   . Traumatic hemopneumothorax 02/09/2016    Patient Active Problem List   Diagnosis Date Noted  . Multiple falls 12/06/2017  .  Short-term memory loss 12/06/2017  . GERD (gastroesophageal reflux disease) 12/06/2017  . Chronic back pain greater than 3 months duration 01/13/2017  . Polysubstance (excluding opioids) dependence, daily use ([place]) 07/01/2016  . Seizure-like activity ([place]) 06/29/2016  . MDD (major depressive disorder), recurrent severe, without psychosis ([place]) 06/27/2016  . Suicidal ideation 06/26/2016  . Bradycardia 06/26/2016  . Tobacco abuse   . Hypokalemia 01/18/2015  . Migraine without aura   . IUD (intrauterine device) in place 06/02/2013  . Hypothyroid 12/09/2012  . Chronic left shoulder pain   . Mood disorder ([place]) 03/25/2012  . AR (allergic rhinitis) 03/25/2012  . Dysmenorrhea 03/25/2012  . PMDD (premenstrual dysphoric disorder) 03/25/2012  . Chronic insomnia 03/25/2012    Past Surgical History:  Procedure Laterality Date  . FRACTURE SURGERY    . POSTERIOR LUMBAR FUSION 4 LEVEL N/A 01/30/2016   Procedure: T9-L2 Posterior Stabilization, Posterior Lumbar Fusion with Pedicle Screws;  Surgeon: Kary Kos, MD;  Location: [place] [facility];  Service: Neurosurgery;  Laterality: N/A;     OB History    Gravida  3   Para  0   Term  0   Preterm  0   AB  3   Living        SAB  0   TAB  3   Ectopic  0   Multiple      Live Births  Home Medications    Prior to Admission medications   Medication Sig Start Date End Date Taking? Authorizing Provider  albuterol (PROAIR HFA) 108 (90 Base) MCG/ACT inhaler TAKE 2 PUFFS EVERY 4 HOURS AS NEEDED 02/28/18  Yes Jeffery, [name], PA-C  ALPRAZolam (XANAX) 1 MG tablet Take 1 mg by mouth 4 (four) times daily.   Yes [provider]  amphetamine-dextroamphetamine (ADDERALL) 20 MG tablet Take 20 mg by mouth 3 (three) times daily.   Yes [provider]  azelastine (ASTELIN) 0.1 % nasal spray Place 2 sprays into both nostrils 2 (two) times daily. Use in each nostril as directed 12/06/17  Yes Jeffery, [name], PA-C    chlordiazePOXIDE (LIBRIUM) 25 MG capsule TAKE 1 CAPSULE BY MOUTH 4 TIMES A DAY FOR 1 WEEK, THEN DISCONTINUE 02/27/18  Yes [provider]  dexlansoprazole (DEXILANT) 60 MG capsule Take 1 capsule (60 mg total) by mouth daily. 12/06/17  Yes Jeffery, [name], PA-C  divalproex (DEPAKOTE ER) 500 MG 24 hr tablet Take 1,500 mg by mouth at bedtime.    Yes [provider]  ibuprofen (ADVIL,MOTRIN) 800 MG tablet TAKE 1 TABLET BY MOUTH EVERY 8 HOURS AS NEEDED 02/26/18  Yes Weber, Damaris Hippo, PA-C  levonorgestrel (MIRENA) 20 MCG/24HR IUD 1 each by Intrauterine route once.   Yes [provider]  levothyroxine (SYNTHROID, LEVOTHROID) 50 MCG tablet TAKE 1 TABLET BY MOUTH EVERY DAY 11/04/16  Yes Jeffery, [name], PA-C  lidocaine (LIDODERM) 5 % Place 3 patches onto the skin daily. Remove & Discard patch within 12 hours or as directed by provider 12/06/17  Yes Jeffery, [name], PA-C  ondansetron (ZOFRAN) 8 MG tablet Take 8 mg by mouth daily as needed for nausea.  11/11/16  Yes [provider]  tiZANidine (ZANAFLEX) 4 MG tablet TAKE 3 TABLETS BY MOUTH 3 TIMES A DAY 02/26/18  Yes Weber, Sarah L, PA-C  traZODone (DESYREL) 150 MG tablet Take 150 mg by mouth at bedtime as needed. 04/12/17  Yes [provider]  zolpidem (AMBIEN) 10 MG tablet Take 5-10 mg by mouth at bedtime as needed for sleep.   Yes [provider]  gabapentin (NEURONTIN) 400 MG capsule TAKE 2 CAPSULES (800 MG TOTAL) BY MOUTH 3 (THREE) TIMES DAILY. Patient not taking: Reported on 03/04/2018 01/14/18   Harrison Mons, PA-C  nitrofurantoin, macrocrystal-monohydrate, (MACROBID) 100 MG capsule Take 1 capsule (100 mg total) by mouth 2 (two) times daily. Patient not taking: Reported on 01/30/2018 12/12/17   Harrison Mons, PA-C    Family History Family History  Problem Relation Age of Onset  . Migraines Mother   . Mental illness Mother   . Heart disease Father   . Alcohol abuse Father   . Arthritis Maternal Grandmother    . Thyroid disease Maternal Grandmother   . Heart disease Maternal Grandfather        AMI 1996, 2014  . Anemia Maternal Grandfather        bone marrow dysfunction    Social History Social History   Tobacco Use  . Smoking status: Current Every Day Smoker    Packs/day: 1.00    Types: E-cigarettes  . Smokeless tobacco: Never Used  Substance Use Topics  . Alcohol use: Yes    Alcohol/week: 0.0 oz    Comment: occasionally  . Drug use: Yes    Types: Marijuana     Allergies   Penicillins; Progestins; Progestins; Sinequan [doxepin]; and Sinequan [doxepin]   Review of Systems Review of Systems  All systems reviewed  and negative, other than as noted in HPI.  Physical Exam Updated Vital Signs BP (!) 86/60   Pulse 73   Temp 97.8 F (36.6 C) (Oral)   Resp (!) 9   Ht 5\' 7"  (1.702 m)   Wt 68 kg (150 lb)   SpO2 98%   BMI 23.49 kg/m   Physical Exam  Constitutional: She appears well-developed and well-nourished. No distress.  HENT:  Head: Normocephalic and atraumatic.  Eyes: Conjunctivae are normal. Right eye exhibits no discharge. Left eye exhibits no discharge.  Neck: Neck supple.  Cardiovascular: Normal rate, regular rhythm and normal heart sounds. Exam reveals no gallop and no friction rub.  No murmur heard. Pulmonary/Chest: Effort normal and breath sounds normal. No respiratory distress.  Abdominal: Soft. She exhibits no distension. There is no tenderness.  Musculoskeletal: She exhibits no edema or tenderness.  Neurological: She is alert.  Skin: Skin is warm and dry.  Psychiatric: She has a normal mood and affect. Her behavior is normal. Thought content normal.  Nursing note and vitals reviewed.    ED Treatments / Results  Labs (all labs ordered are listed, but only abnormal results are displayed) Labs Reviewed  BASIC METABOLIC PANEL - Abnormal; Notable for the following components:      Result Value   Glucose, Bld 104 (*)    All other components within  normal limits  CBC - Abnormal; Notable for the following components:   Hemoglobin 11.6 (*)    HCT 35.1 (*)    RDW 17.2 (*)    All other components within normal limits  I-STAT BETA HCG BLOOD, ED (MC, WL, AP ONLY) - Abnormal; Notable for the following components:   I-stat hCG, quantitative 10.2 (*)    All other components within normal limits  URINALYSIS, ROUTINE W REFLEX MICROSCOPIC  CBG MONITORING, ED    EKG EKG Interpretation  Date/Time:  Wednesday March 04 2018 13:40:39 EDT Ventricular Rate:  69 PR Interval:    QRS Duration: 90 QT Interval:  402 QTC Calculation: 431 R Axis:   86 Text Interpretation:  Sinus rhythm Left atrial enlargement Confirmed by Virgel Manifold [phone]) on 03/04/2018 3:31:33 PM   Radiology No results found.  Procedures Procedures (including critical care time)  Medications Ordered in ED Medications  sodium chloride 0.9 % bolus 1,000 mL (1,000 mLs Intravenous New Bag/Given 03/04/18 1434)     Initial Impression / Assessment and Plan / ED Course  I have reviewed the triage vital signs and the nursing notes.  Pertinent labs & imaging results that were available during my care of the patient were reviewed by me and considered in my medical decision making (see chart for details).     36yF with hypotension. Recurrent w/o significant explanatory pathology on work-up. This may be from the tinazidine which is an alpha-2 blocker. She takes 12mg  TID. She is on other sedating medications as well. I doubt emergent process. BP stable in ED.   3:24 PM Of note, secretary reported that pt's "doctor" just called the ED and requested that I write the patient a prescription for xanax because they were out of town. This person did not want to speak with me directly.   4:38 PM Pt called back to the ER after discharge requesting a neck brace for her neck pain. Advised nursing to tell her she could pick up a soft collar at a medical supply or drug store. She then called  back again wanting to know why I didn't address her (  chronic) neck pain. She also told the nurse to "tell him that he is a piece of shit."  Final Clinical Impressions(s) / ED Diagnoses   Final diagnoses:  Near syncope  Hypotension, unspecified hypotension type    ED Discharge Orders    None       Virgel Manifold, MD 03/09/18 [phone]

## 2018-03-04 NOTE — ED Notes (Addendum)
RN called to waiting room. Pt requesting a neck brace for chronic neck pain and refill on her xanax and cab voucher. MD informed and did not prescribe neck brace nor xanax. Pt cursing and upset leaving ED waiting room. Pt left walking in NAD

## 2018-03-04 NOTE — ED Triage Notes (Signed)
Pt brought to ED by [facility] bc she states she has had multiple falls. Pt noted to by hypotensive by EMS with BP 89/52, 71/45, 82/57.

## 2018-03-04 NOTE — Discharge Instructions (Signed)
I suspect your low blood pressure is related to your tizanidine (zanaflex) use. Although this is prescribed as a muscle relaxant, it has similar effects to clonidine which is a blood pressure medication we commonly use.   If you have continued symptoms like this then I would advise trying to reduce the amount you take or discuss with your prescribing provider for an alternative medication.

## 2018-03-04 NOTE — ED Notes (Signed)
Pt notified need for urine sample. Cannot at moment.

## 2018-05-07 ENCOUNTER — Ambulatory Visit: Payer: BLUE CROSS/BLUE SHIELD | Admitting: Physician Assistant

## 2018-05-08 ENCOUNTER — Ambulatory Visit (INDEPENDENT_AMBULATORY_CARE_PROVIDER_SITE_OTHER): Payer: BLUE CROSS/BLUE SHIELD

## 2018-05-08 ENCOUNTER — Encounter: Payer: Self-pay | Admitting: Family Medicine

## 2018-05-08 ENCOUNTER — Other Ambulatory Visit: Payer: Self-pay

## 2018-05-08 ENCOUNTER — Ambulatory Visit (INDEPENDENT_AMBULATORY_CARE_PROVIDER_SITE_OTHER): Payer: BLUE CROSS/BLUE SHIELD | Admitting: Family Medicine

## 2018-05-08 VITALS — BP 123/87 | HR 100 | Temp 98.1°F | Ht 67.0 in | Wt 148.0 lb

## 2018-05-08 DIAGNOSIS — N200 Calculus of kidney: Secondary | ICD-10-CM

## 2018-05-08 DIAGNOSIS — R110 Nausea: Secondary | ICD-10-CM | POA: Diagnosis not present

## 2018-05-08 DIAGNOSIS — N3000 Acute cystitis without hematuria: Secondary | ICD-10-CM

## 2018-05-08 DIAGNOSIS — R112 Nausea with vomiting, unspecified: Secondary | ICD-10-CM | POA: Diagnosis not present

## 2018-05-08 DIAGNOSIS — R8290 Unspecified abnormal findings in urine: Secondary | ICD-10-CM | POA: Diagnosis not present

## 2018-05-08 DIAGNOSIS — K5900 Constipation, unspecified: Secondary | ICD-10-CM

## 2018-05-08 LAB — POCT URINALYSIS DIP (MANUAL ENTRY)
Bilirubin, UA: NEGATIVE
Glucose, UA: NEGATIVE mg/dL
Ketones, POC UA: NEGATIVE mg/dL
Nitrite, UA: NEGATIVE
Protein Ur, POC: NEGATIVE mg/dL
Spec Grav, UA: >=1.03 — AB (ref 1.010–1.025)
Urobilinogen, UA: 0.2 E.U./dL
pH, UA: 5.5 (ref 5.0–8.0)

## 2018-05-08 LAB — POC MICROSCOPIC URINALYSIS (UMFC): Mucus: ABSENT

## 2018-05-08 LAB — POCT URINE PREGNANCY: Preg Test, Ur: NEGATIVE

## 2018-05-08 MED ORDER — PROMETHAZINE HCL 25 MG PO TABS: 25.0000 mg | ORAL_TABLET | Freq: Three times a day (TID) | ORAL | 0 refills | Status: DC | PRN

## 2018-05-08 MED ORDER — POLYETHYLENE GLYCOL 3350 17 GM/SCOOP PO POWD: 17.0000 g | Freq: Every day | ORAL | 1 refills | Status: DC

## 2018-05-08 MED ORDER — GABAPENTIN 400 MG PO CAPS: 800.0000 mg | ORAL_CAPSULE | Freq: Three times a day (TID) | ORAL | 0 refills | Status: DC

## 2018-05-08 NOTE — Progress Notes (Signed)
8/30/201912:29 PM  Barbara Rogers [birth year], 37 y.o. female [id number]  Chief Complaint  Patient presents with  . Nausea    since last Thursday, loss of appetite. Using ensure for supplement. Wants paperwork filled out to show why shes been out of work since Thursday.Marland Kitchen Has appt with pcp on 9/12    HPI:   Patient is a 37 y.o. female with complicated past medical history who presents today nausea, vomiting and decreased appetite  Normally gets GI upset prior to her menses But for the past week has been very nauseous  Goes from not having any appetite to having horrible hunger pains Able to tolerate ensure and chocolate milk Worried about passing out at work as she at night, very hot and sweaty Works at [employer] No diarrhea Has been vomiting as soon as she eats anything, even if bland, but none since yesteray No blood in vomit or stool Positive constipation, last good BM was a week ago Normally regular No fever or chills but has been sweaty, achy Has been taking zofran twice a day,  No dysuria Needs paperwork completed for work Sees PCP on the 12th, requesting refill of gabapentin  Fall Risk  05/08/2018 12/06/2017 04/26/2017 02/17/2017 01/13/2017  Falls in the past year? No Yes Yes Yes Yes  Number falls in past yr: - 2 or more 1 1 2  or more  Injury with Fall? - Yes No - Yes  Comment - - - - broke two ribs in shower, spit scalp      Depression screen [facility] 2/9 05/08/2018 12/06/2017 04/26/2017  Decreased Interest 0 0 0  Down, Depressed, Hopeless 0 3 0  PHQ - 2 Score 0 3 0  Altered sleeping - 3 -  Tired, decreased energy - 1 -  Change in appetite - 0 -  Feeling bad or failure about yourself  - 0 -  Trouble concentrating - 3 -  Moving slowly or fidgety/restless - 3 -  Suicidal thoughts - 2 -  PHQ-9 Score - 15 -  Difficult doing work/chores - Extremely dIfficult -  Some recent data might be hidden    Allergies  Allergen Reactions  . Penicillins Other (See Comments)   Unknown Has patient had a PCN reaction causing immediate rash, facial/tongue/throat swelling, SOB or lightheadedness with hypotension:  unknown Has patient had a PCN reaction causing severe rash involving mucus membranes or skin necrosis:  unknown Has patient had a PCN reaction that required hospitalization  unknown Has patient had a PCN reaction occurring within the last 10 years: unknown If all of the above answers are "NO", the  . Progestins     Extreme moodiness  . Progestins Other (See Comments)    Extreme moodiness  . Sinequan [Doxepin] Other (See Comments) and Hypertension    Hallucinations, delusions, severe agitation  . Sinequan [Doxepin] Other (See Comments)    hallucinations    Prior to Admission medications   Medication Sig Start Date End Date Taking? Authorizing Provider  albuterol (PROAIR HFA) 108 (90 Base) MCG/ACT inhaler TAKE 2 PUFFS EVERY 4 HOURS AS NEEDED 02/28/18  Yes Jeffery, [name], PA  ALPRAZolam (XANAX) 1 MG tablet Take 1 mg by mouth 4 (four) times daily.   Yes [provider]  amphetamine-dextroamphetamine (ADDERALL) 20 MG tablet Take 20 mg by mouth 3 (three) times daily.   Yes [provider]  azelastine (ASTELIN) 0.1 % nasal spray Place 2 sprays into both nostrils 2 (two) times daily. Use in each nostril as  directed 12/06/17  Yes Jeffery, [name], PA  dexlansoprazole (DEXILANT) 60 MG capsule Take 1 capsule (60 mg total) by mouth daily. 12/06/17  Yes Jeffery, [name], PA  divalproex (DEPAKOTE ER) 500 MG 24 hr tablet Take 1,500 mg by mouth at bedtime.    Yes [provider]  gabapentin (NEURONTIN) 400 MG capsule TAKE 2 CAPSULES (800 MG TOTAL) BY MOUTH 3 (THREE) TIMES DAILY. 01/14/18  Yes Jeffery, [name], PA  ibuprofen (ADVIL,MOTRIN) 800 MG tablet TAKE 1 TABLET BY MOUTH EVERY 8 HOURS AS NEEDED 02/26/18  Yes Weber, Damaris Hippo, PA-C  levonorgestrel (MIRENA) 20 MCG/24HR IUD 1 each by Intrauterine route once.   Yes [provider]  lidocaine  (LIDODERM) 5 % Place 3 patches onto the skin daily. Remove & Discard patch within 12 hours or as directed by provider 12/06/17  Yes Jeffery, [name], PA  ondansetron (ZOFRAN) 8 MG tablet Take 8 mg by mouth daily as needed for nausea.  11/11/16  Yes [provider]  tiZANidine (ZANAFLEX) 4 MG tablet TAKE 3 TABLETS BY MOUTH 3 TIMES A DAY 02/26/18  Yes Weber, Sarah L, PA-C  traZODone (DESYREL) 150 MG tablet Take 150 mg by mouth at bedtime as needed. 04/12/17  Yes [provider]  zolpidem (AMBIEN) 10 MG tablet Take 5-10 mg by mouth at bedtime as needed for sleep.   Yes [provider]  chlordiazePOXIDE (LIBRIUM) 25 MG capsule TAKE 1 CAPSULE BY MOUTH 4 TIMES A DAY FOR 1 WEEK, THEN DISCONTINUE 02/27/18   [provider]  levothyroxine (SYNTHROID, LEVOTHROID) 50 MCG tablet TAKE 1 TABLET BY MOUTH EVERY DAY Patient not taking: Reported on 05/08/2018 11/04/16   Harrison Mons, PA  nitrofurantoin, macrocrystal-monohydrate, (MACROBID) 100 MG capsule Take 1 capsule (100 mg total) by mouth 2 (two) times daily. Patient not taking: Reported on 05/08/2018 12/12/17   Harrison Mons, PA    Past Medical History:  Diagnosis Date  . AKI (acute kidney injury) ([place]) 01/18/2015  . Allergy   . Anxiety   . Asthma    exacerbated by bronchitisi  . Asthma   . C3 cervical fracture ([place])   . C3 cervical fracture ([place]) 01/29/2016  . Cervical transverse process fracture ([place])   . Chronic left shoulder pain 08/2012  . Clavicle fracture   . Concussion 01/29/2016  . Depression   . 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 Dysmenorrhea   . Dysphagia   . Finger fracture, left 10/31/2012   LEFT 4th finger  . Insomnia   . Lumbar transverse process fracture ([place])   . MDD (major depressive disorder), recurrent episode, severe ([place]) 06/30/2016  . 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 Migraine without aura   . Mood disorder ([place])   .  Nephrolithiasis   . OD (overdose of drug), intentional self-harm, initial encounter ([place]) 06/26/2016  . PMDD (premenstrual dysphoric disorder)   . Respiratory failure ([place])   . Seizure-like activity ([place]) 06/30/2016   normal EEG, question pseudoseizures  . T12 burst fracture ([place])   . Traumatic hemo-pneumothorax   . Traumatic hemopneumothorax 02/09/2016    Past Surgical History:  Procedure Laterality Date  . FRACTURE SURGERY    . POSTERIOR LUMBAR FUSION 4 LEVEL N/A 01/30/2016   Procedure: T9-L2 Posterior Stabilization, Posterior Lumbar Fusion with Pedicle Screws;  Surgeon: Kary Kos, MD;  Location: [place] [facility];  Service: Neurosurgery;  Laterality: N/A;    Social History   Tobacco Use  . Smoking status: Current Every  Day Smoker    Packs/day: 1.00    Types: E-cigarettes  . Smokeless tobacco: Never Used  Substance Use Topics  . Alcohol use: Yes    Alcohol/week: 0.0 standard drinks    Comment: occasionally    Family History  Problem Relation Age of Onset  . Migraines Mother   . Mental illness Mother   . Heart disease Father   . Alcohol abuse Father   . Arthritis Maternal Grandmother   . Thyroid disease Maternal Grandmother   . Heart disease Maternal Grandfather        AMI 1996, 2014  . Anemia Maternal Grandfather        bone marrow dysfunction    ROS Per hpi  OBJECTIVE:  Blood pressure 123/87, pulse 100, temperature 98.1 F (36.7 C), temperature source Oral, height 5\' 7"  (1.702 m), weight 148 lb (67.1 kg), SpO2 99 %. Body mass index is 23.18 kg/m.   Wt Readings from Last 3 Encounters:  05/08/18 148 lb (67.1 kg)  03/04/18 150 lb (68 kg)  01/30/18 160 lb (72.6 kg)    Physical Exam  Constitutional: She is oriented to person, place, and time. She appears well-developed and well-nourished.  HENT:  Head: Normocephalic and atraumatic.  Mouth/Throat: Oropharynx is clear and moist. No oropharyngeal exudate.  Eyes: Pupils are equal, round, and reactive to light.  Conjunctivae and EOM are normal. No scleral icterus.  Neck: Neck supple.  Cardiovascular: Normal rate, regular rhythm and normal heart sounds. Exam reveals no gallop and no friction rub.  No murmur heard. Pulmonary/Chest: Effort normal and breath sounds normal. She has no wheezes. She has no rales.  Abdominal: Soft. Bowel sounds are normal. There is no hepatosplenomegaly. There is generalized tenderness (but more prominent along RLQ). There is no CVA tenderness and no tenderness at McBurney's point.  Musculoskeletal: She exhibits no edema.  Neurological: She is alert and oriented to person, place, and time.  Skin: Skin is warm and dry.  Psychiatric: She has a normal mood and affect.  Nursing note and vitals reviewed.   Results for orders placed or performed in visit on 05/08/18 (from the past 24 hour(s))  POCT urinalysis dipstick     Status: Abnormal   Collection Time: 05/08/18 12:42 PM  Result Value Ref Range   Color, UA yellow yellow   Clarity, UA clear clear   Glucose, UA negative negative mg/dL   Bilirubin, UA negative negative   Ketones, POC UA negative negative mg/dL   Spec Grav, UA >=1.030 (A) 1.010 - 1.025   Blood, UA trace-intact (A) negative   pH, UA 5.5 5.0 - 8.0   Protein Ur, POC negative negative mg/dL   Urobilinogen, UA 0.2 0.2 or 1.0 E.U./dL   Nitrite, UA Negative Negative   Leukocytes, UA Trace (A) Negative  POCT urine pregnancy     Status: Normal   Collection Time: 05/08/18 12:43 PM  Result Value Ref Range   Preg Test, Ur Negative Negative  POCT Microscopic Urinalysis ([facility])     Status: Abnormal   Collection Time: 05/08/18  2:00 PM  Result Value Ref Range   WBC,UR,HPF,POC Too numerous to count  (A) None WBC/hpf   RBC,UR,HPF,POC Few (A) None RBC/hpf   Bacteria Few (A) None, Too numerous to count   Mucus Absent Absent   Epithelial Cells, UR Per Microscopy Few (A) None, Too numerous to count cells/hpf    Dg Abd 2 Views  Result Date: 05/08/2018 CLINICAL DATA:   Nausea, vomiting. EXAM: ABDOMEN -  2 VIEW COMPARISON:  Radiographs of March 06, 2017. FINDINGS: The bowel gas pattern is normal. There is no evidence of free air. Right nephrolithiasis is noted. Phleboliths are seen in the pelvis. IMPRESSION: Right nephrolithiasis.  No evidence of bowel obstruction or ileus. Electronically Signed   By: Marijo Conception, M.D.   On: 05/08/2018 13:03     ASSESSMENT and PLAN  1. Nausea and vomiting, intractability of vomiting not specified, unspecified vomiting type - POCT urinalysis dipstick - POCT urine pregnancy - DG Abd 2 Views; Future - POCT Microscopic Urinalysis ([facility])  2. Constipation, unspecified constipation type Discussed supportive measures, new meds r/se/b and RTC precautions. Patient educational handout given.  3. Abnormal urinalysis - urine culture  Other orders - promethazine (PHENERGAN) 25 MG tablet; Take 1 tablet (25 mg total) by mouth every 8 (eight) hours as needed for nausea or vomiting. - polyethylene glycol powder (GLYCOLAX/MIRALAX) powder; Take 17 g by mouth daily. - gabapentin (NEURONTIN) 400 MG capsule; Take 2 capsules (800 mg total) by mouth 3 (three) times daily.  Return with PCP as scheduled.    Rutherford Guys, MD [facility] [place], [place] [address] Ph.  [phone] Fax [phone]

## 2018-05-08 NOTE — Patient Instructions (Addendum)
  For constipation   Make sure you are drinking enough water daily. Make sure you are getting enough fiber in your diet - this will make you regular - you can eat high fiber foods or use metamucil as a supplement - it is really important to drink enough water when using fiber supplements.  If your stools are hard or are formed balls or you have to strain a stool softener will help - use colace 2-3 capsule a day  For gentle treatment of constipation Use Miralax 1-2 capfuls a day until your stools are soft and regular and then decrease the usage - you can use this daily  For more aggressive treatment of constipation Use 4 capfuls of Colace and 6 doses of Miralax and drink it in 2 hours - this should result in several watery stools - if it does not repeat the next day and then go to daily miralax for a week to make sure your bowels are clean and retrained to work properly  For the most aggressive treatment of constipation Use 14 capfuls of Miralax in 1 gallon of fluid (gatoraid or water work well or a combination of the two) and drink over 12h - it is ok to eat during this time and then use Miralax 1 capful daily for about 2 weeks to prevent the constipation from returning   If you have lab work done today you will be contacted with your lab results within the next 2 weeks.  If you have not heard from us then please contact us. The fastest way to get your results is to register for My Chart.   IF you received an x-ray today, you will receive an invoice from [facility]. Please contact [facility] at [phone] with questions or concerns regarding your invoice.   IF you received labwork today, you will receive an invoice from [facility]. Please contact [facility] at [phone] with questions or concerns regarding your invoice.   Our billing staff will not be able to assist you with questions regarding bills from these companies.  You will be contacted with the lab results as  soon as they are available. The fastest way to get your results is to activate your My Chart account. Instructions are located on the last page of this paperwork. If you have not heard from us regarding the results in 2 weeks, please contact this office.     

## 2018-05-10 LAB — URINE CULTURE

## 2018-05-11 ENCOUNTER — Encounter: Payer: Self-pay | Admitting: Family Medicine

## 2018-05-11 MED ORDER — PHENAZOPYRIDINE HCL 100 MG PO TABS: 100.0000 mg | ORAL_TABLET | Freq: Three times a day (TID) | ORAL | 0 refills | Status: DC | PRN

## 2018-05-11 MED ORDER — TAMSULOSIN HCL 0.4 MG PO CAPS: 0.4000 mg | ORAL_CAPSULE | Freq: Every day | ORAL | 0 refills | Status: DC

## 2018-05-11 MED ORDER — AZELASTINE HCL 0.1 % NA SOLN: 2.0000 | Freq: Two times a day (BID) | NASAL | 0 refills | Status: DC

## 2018-05-11 MED ORDER — CIPROFLOXACIN HCL 500 MG PO TABS: 500.0000 mg | ORAL_TABLET | Freq: Two times a day (BID) | ORAL | 0 refills | Status: DC

## 2018-05-11 NOTE — Addendum Note (Signed)
Addended by: Rutherford Guys on: 05/11/2018 12:48 AM   Modules accepted: Orders

## 2018-05-11 NOTE — Addendum Note (Signed)
Addended by: Rutherford Guys on: 05/11/2018 01:12 PM   Modules accepted: Orders

## 2018-05-12 ENCOUNTER — Telehealth: Payer: Self-pay | Admitting: Family Medicine

## 2018-05-12 NOTE — Telephone Encounter (Signed)
faby from [facility] called and stated that an ultrasound is not recommended for kidney stones a CT abd and plelvis WO contrast is recommend. Once the stones have left kidney they want be able to see them with an ultrasound. Please advise. Thanks

## 2018-05-12 NOTE — Telephone Encounter (Signed)
Message sent to to scheduling pool for patient to be re-evaluated tomorrow.  Labs not drawn for unclear reasons

## 2018-05-13 NOTE — Telephone Encounter (Signed)
Saw that patient had called in and made an appt for Saturday. I called to let her know that there were appts available for today. She said that she does not have a ride today so she will just have to wait until Saturday.

## 2018-05-14 DIAGNOSIS — Z0271 Encounter for disability determination: Secondary | ICD-10-CM

## 2018-05-16 ENCOUNTER — Ambulatory Visit: Payer: BLUE CROSS/BLUE SHIELD | Admitting: Family Medicine

## 2018-05-16 ENCOUNTER — Encounter: Payer: Self-pay | Admitting: Family Medicine

## 2018-05-16 VITALS — BP 119/79 | HR 120 | Temp 98.4°F | Resp 16 | Ht 67.0 in | Wt 152.2 lb

## 2018-05-16 DIAGNOSIS — N926 Irregular menstruation, unspecified: Secondary | ICD-10-CM | POA: Diagnosis not present

## 2018-05-16 DIAGNOSIS — N3000 Acute cystitis without hematuria: Secondary | ICD-10-CM | POA: Diagnosis not present

## 2018-05-16 DIAGNOSIS — R1011 Right upper quadrant pain: Secondary | ICD-10-CM

## 2018-05-16 DIAGNOSIS — N200 Calculus of kidney: Secondary | ICD-10-CM | POA: Diagnosis not present

## 2018-05-16 DIAGNOSIS — Z01419 Encounter for gynecological examination (general) (routine) without abnormal findings: Secondary | ICD-10-CM

## 2018-05-16 MED ORDER — HYDROCODONE-ACETAMINOPHEN 5-325 MG PO TABS: 1.0000 | ORAL_TABLET | ORAL | 0 refills | Status: DC | PRN

## 2018-05-16 NOTE — Progress Notes (Signed)
Patient ID: Barbara Rogers, female    DOB: 02-25-1981  Age: 37 y.o. MRN: [id number]  Chief Complaint  Patient presents with  . right side    per pt "hurts really bad" x 2 days, pain is more lower, I have kidney stones before, but I don't feel them moving around like before".  . pap smear    "periods have been more irregular"    Subjective:   37 year old lady who has been here recently.  She had pain and turned out to have a nephrolithiasis and a urinary tract infection.  She is on the ciprofloxacin still.  She is not running fevers.  She has hurt by the last couple of days.  She wanted to know if she needs another x-ray.  She is never seen a urologist but has had kidney stones several times.  She does not have a car currently and needs a Pap smear.  She has been having her menstrual cycle but is only bleeding lightly today and would like me to go ahead and do it.  Still has some dysuria but still is on the medicines.  Current allergies, medications, problem list, past/family and social histories reviewed.  Objective:  BP 119/79 (BP Location: Right Arm, Patient Position: Sitting, Cuff Size: Normal)   Pulse (!) 120   Temp 98.4 F (36.9 C) (Oral)   Resp 16   Ht 5\' 7"  (1.702 m)   Wt 152 lb 3.2 oz (69 kg)   LMP 05/13/2018   SpO2 100%   BMI 23.84 kg/m   No major distress.  She has had back surgery so has a big scar down her back and hurts a lot in the lower thoracic upper lumbar regions.  Is tender to touch.  Some right CVA tenderness persist.  Her abdomen had normal bowel sounds, soft without masses, pelvic examination revealed normal external genitalia.  A little eczematoid rash on her left inner thigh lower than the genitalia.  Her vaginal mucosa appears unremarkable.  Swabbed out a little bit of old blood.  IUD cord is in place.  Pap was taken.  Bimanual exam reveals uterus to be anterior with no adnexal or uterine masses.  No chandelier sign or are significant tenderness.  Assessment &  Plan:   Assessment: 1. Nephrolithiasis   2. Abdominal pain, right upper quadrant   3. Acute cystitis without hematuria   4. Irregular menstrual cycle   5. Pap smear, low-risk       Plan: Partially treated UTI, nephrolithiasis, chronic back pain, and menstrual period are combining to cause her to be uncomfortable still.  I am going to give her a few pain pills.  Her last pain prescription she states was 2 years ago.  She denies any use of illicit medicines.  Commended she see a urologist.  She would like a referral.  Orders Placed This Encounter  Procedures  . Ambulatory referral to Urology    Referral Priority:   Routine    Referral Type:   Consultation    Referral Reason:   Specialty Services Required    Requested Specialty:   Urology    Number of Visits Requested:   1    Meds ordered this encounter  Medications  . HYDROcodone-acetaminophen (NORCO) 5-325 MG tablet    Sig: Take 1 tablet by mouth every 4 (four) hours as needed.    Dispense:  15 tablet    Refill:  0         Patient Instructions  Continue to drink lots of fluids to keep yourself well flushed out  Use Tylenol (acetaminophen) 100 mg 2 pills 3 times daily as needed for pain  In addition can use ibuprofen 200 mg 3 to 4 pills 3 times daily to 600 to 800 mg) for pain  I am giving you a few additional pain medicines hydrocodone/acetaminophen 5 mg to take every 4-6 hours only for very severe pain not relieved by the above regimen.  Please note that these pills have a little acetaminophen in them, and you should keep account of how much acetaminophen you take, do not exceed 3000 mg in 24 hours.  Referral is being made to a urologist.  Go to the emergency room if acutely worse.   If you have lab work done today you will be contacted with your lab results within the next 2 weeks.  If you have not heard from [place] then please contact us. The fastest way to get your results is to register for My Chart.   IF  you received an x-ray today, you will receive an invoice from [facility]. Please contact [facility] at [phone] with questions or concerns regarding your invoice.   IF you received labwork today, you will receive an invoice from [facility]. Please contact [facility] at [phone] with questions or concerns regarding your invoice.   Our billing staff will not be able to assist you with questions regarding bills from these companies.  You will be contacted with the lab results as soon as they are available. The fastest way to get your results is to activate your My Chart account. Instructions are located on the last page of this paperwork. If you have not heard from [place] regarding the results in 2 weeks, please contact this office.        Return if symptoms worsen or fail to improve.   Ruben Reason, MD 05/16/2018

## 2018-05-16 NOTE — Patient Instructions (Addendum)
  Continue to drink lots of fluids to keep yourself well flushed out  Use Tylenol (acetaminophen) 100 mg 2 pills 3 times daily as needed for pain  In addition can use ibuprofen 200 mg 3 to 4 pills 3 times daily to 600 to 800 mg) for pain  I am giving you a few additional pain medicines hydrocodone/acetaminophen 5 mg to take every 4-6 hours only for very severe pain not relieved by the above regimen.  Please note that these pills have a little acetaminophen in them, and you should keep account of how much acetaminophen you take, do not exceed 3000 mg in 24 hours.  Referral is being made to a urologist.  Go to the emergency room if acutely worse.   If you have lab work done today you will be contacted with your lab results within the next 2 weeks.  If you have not heard from [place] then please contact us. The fastest way to get your results is to register for My Chart.   IF you received an x-ray today, you will receive an invoice from [facility]. Please contact [facility] at [phone] with questions or concerns regarding your invoice.   IF you received labwork today, you will receive an invoice from [facility]. Please contact [facility] at [phone] with questions or concerns regarding your invoice.   Our billing staff will not be able to assist you with questions regarding bills from these companies.  You will be contacted with the lab results as soon as they are available. The fastest way to get your results is to activate your My Chart account. Instructions are located on the last page of this paperwork. If you have not heard from [place] regarding the results in 2 weeks, please contact this office.

## 2018-05-18 ENCOUNTER — Telehealth: Payer: Self-pay | Admitting: Family Medicine

## 2018-05-18 NOTE — Telephone Encounter (Signed)
Letter faxed to employer

## 2018-05-18 NOTE — Telephone Encounter (Signed)
Copied from [place] [id number]. Topic: Inquiry >> May 18, 2018  9:48 AM Oliver Pila B wrote: Reason for CRM: pt is needing letter from last visit 9.7.19 faxed to her employer [employer] @ [phone] ; write on letter also claim # [id number]  employee Id: [id number]

## 2018-05-19 ENCOUNTER — Telehealth: Payer: Self-pay | Admitting: Family Medicine

## 2018-05-19 NOTE — Telephone Encounter (Signed)
Medical Records will contact pt and verify address and record. Called pt and informed her that we could not give her a note for two weeks but will extend until Friday and informed her Urology will have to give her a note at her OV.

## 2018-05-19 NOTE — Telephone Encounter (Signed)
Copied from [place] [phone]. Topic: Quick Communication - See Telephone Encounter >> May 19, 2018 12:12 PM Neva Seat wrote: Pt needing all her medical records sent to her home address since Dr. Tamala Julian left she is going to find a new doctor closer to her home. Home address: [address]  [place] [address]  Pt saw Dr. Linna Darner this past weekend and is having a lot of pain from kidney stone.  Pt is also still waiting on urologist appt.  She is asking if her absent note from work can be extended to the end of the week and thru next? Please call pt back to let her know.

## 2018-05-19 NOTE — Telephone Encounter (Signed)
Copied from [place] [id number]. Topic: Quick Communication - See Telephone Encounter >> May 19, 2018 12:12 PM Barbara Rogers wrote: Pt needing all her medical records  Pt is having a lot of pain from kidney stone - waiting on urologist appt.

## 2018-05-19 NOTE — Telephone Encounter (Signed)
Requesting work note to be faxed to [employer] [phone] Claim # [id number] Employee ID # [phone]

## 2018-05-23 ENCOUNTER — Other Ambulatory Visit: Payer: Self-pay | Admitting: Physician Assistant

## 2018-05-23 DIAGNOSIS — G894 Chronic pain syndrome: Secondary | ICD-10-CM

## 2018-05-25 NOTE — Telephone Encounter (Signed)
Tizanidine 4 mg refill Last Refill:02/26/18 # 810 Last OV: 05/16/18 PCP:  Former pt of Sarah Weber. Last seen by Dr. Linna Darner  Pharmacy:  [facility] # [phone]  Ibuprofen 800 mg refill Last Refill:02/26/18 # 270 Last OV: 05/16/18 PCP:  Former pt of Windell Hummingbird. Last seen by Dr. Linna Darner [facility] # [phone]   Please review for refill.  No upcoming office visit.

## 2018-05-26 NOTE — Telephone Encounter (Signed)
Pt is calling to request the 3 out of work notes that have been written for her and the form that was filled out for Holland Falling be faxed to [employer] at [phone]. She states the form must have her employee ID # on it which is [id number]. Please advise.

## 2018-05-26 NOTE — Telephone Encounter (Signed)
Below message signed in error. Please advise.

## 2018-05-27 LAB — PAP IG, CT-NG, RFX HPV ASCU
CHLAMYDIA, NUC. ACID AMP: NEGATIVE
Gonococcus by Nucleic Acid Amp: NEGATIVE
PAP SMEAR COMMENT: 0

## 2018-05-29 ENCOUNTER — Encounter: Payer: Self-pay | Admitting: *Deleted

## 2018-05-29 NOTE — Telephone Encounter (Signed)
Tried to reach patient number unavailable need to clarify what date back she needs faxed for notes.

## 2018-06-01 NOTE — Telephone Encounter (Signed)
Patient called to request FMLA form information stated that doctor released her for work on 05/29/18 but after going back to work on 05/31/18 and 06/01/18 she was unable to work due to the pain that she is in. She is requesting additional time off plus information on referral to a Urologist for Kidney stones. Patient also stated that her phone is off and the only way to communicate with her is on My chart.

## 2018-06-02 ENCOUNTER — Telehealth: Payer: Self-pay | Admitting: *Deleted

## 2018-06-03 ENCOUNTER — Other Ambulatory Visit: Payer: Self-pay

## 2018-06-03 ENCOUNTER — Encounter (HOSPITAL_COMMUNITY): Payer: Self-pay | Admitting: Emergency Medicine

## 2018-06-03 ENCOUNTER — Emergency Department (HOSPITAL_COMMUNITY)
Admission: EM | Admit: 2018-06-03 | Discharge: 2018-06-03 | Disposition: A | Discharge status: Home / Self Care | Payer: BLUE CROSS/BLUE SHIELD | Attending: Emergency Medicine | Admitting: Emergency Medicine

## 2018-06-03 DIAGNOSIS — F419 Anxiety disorder, unspecified: Secondary | ICD-10-CM | POA: Diagnosis present

## 2018-06-03 DIAGNOSIS — Z79899 Other long term (current) drug therapy: Secondary | ICD-10-CM | POA: Diagnosis not present

## 2018-06-03 DIAGNOSIS — J45909 Unspecified asthma, uncomplicated: Secondary | ICD-10-CM | POA: Insufficient documentation

## 2018-06-03 DIAGNOSIS — F17290 Nicotine dependence, other tobacco product, uncomplicated: Secondary | ICD-10-CM | POA: Diagnosis not present

## 2018-06-03 MED ORDER — LORAZEPAM 1 MG PO TABS
1.0000 mg | ORAL_TABLET | Freq: Once | ORAL | Status: AC
  Administered 2018-06-03: 1 mg via ORAL
  Filled 2018-06-03: qty 1

## 2018-06-03 NOTE — ED Notes (Signed)
Pt seen by er provider. Pt found sleeping on floor in room. Reports she was trying to get her drink that was within reach on bedside table. Pt putting on clothes in room stating she was leaving.

## 2018-06-03 NOTE — ED Notes (Signed)
Pt initially told by ER provider she may sleep or get someone to pick her up. Pt reports she had no one to pick her up. Pt requesting for officer who brought her in to take her home. Pt intoxicated.

## 2018-06-03 NOTE — ED Notes (Signed)
Pt taken home by [employer].

## 2018-06-03 NOTE — Discharge Instructions (Addendum)
Substance Abuse Treatment Programs ° °Intensive Outpatient Programs °[facility]     °[address]      °[place], [place]                   °[phone]      ° °[facility] °[address] °[place], [place] °[phone] ° °[facility]     °(Inpatient and outpatient)     °[address]           °[phone]   ° °[facility] °[phone] (Suboxone and Methadone) ° °[address]      °[place], [place] [address]      °[phone]      ° °[address] °[place], [place] °[phone] ° °[facility] (Outpatient/Inpatient, Chemical)    °(insurance only) [phone]      °       °[facility] (Groups & Residential) °[place], [place] °[phone] ° °   °[facility]     °[address]     °[place], [place]      °[phone]      ° °[facility] (for caregivers and family) °[address] °[place], [place] °336-299-4655 ° ° ° ° ° °Residential Treatment Programs °[facility]      °[address], [place], [place] [address]  °([phone]      ° °[facility] °[address] [place], [place] [address] °[phone] ° °[facility]        °[phone]      ° °[facility] °1-800-659-3381 ° °[facility]             °[address]                                         °[place], [place]                                                °877-615-2722 or [phone]                              ° °[facility] [place] °[address] °[place], [address] °1.877.941.8954 ° °[facility]    °[address]      °[place], [place]     °[phone]      ° °[facility] °[address] °[place], [place] °336-285-9073 ° °[facility]   °[address]     °[place], [place] [address]     °[phone]      °Admissions: 8am-3pm M-F ° °[facility] °[address] °[place],  [place] °336-227-7417 ° °[facility]: Residential Program (90 Days)   °[place], [place]      °336-725-8389 or 800-758-6077    ° °[facility] °[place], [place] °(Walk in Hours over the weekend or by referral) ° °[facility] °[address], [place],  [address] °([phone] ° °Crisis Mobile: [facility]:  1-877-626-1772 (for crisis response 24 hours a day) °[facility] Hotline:      1-800-256-2452 °Outpatient Psychiatry and Counseling ° °[facility]: Mobile Crisis   Management 24 hours:  1-877-626-1772 ° °[facility] sliding scale fee and walk in schedule: M-F 8am-12pm/1pm-3pm °[address]  °[place], [place] [address] °[phone] ° °[facility] °[address] °[place], [place] [address] °336-703-9650 ° °[facility] ([facility])- new patient walk-in appointments available Monday - Friday 8am -3pm.          °[address] °[place], [place] [address] °336-676-6840 or crisis line- 336-676-6905 ° °[facility]/ Intensive Outpatient Therapy Program °[address] °[place], [place] [address] °336-832-9804 ° °[facility]                  °Crisis Services      °[phone]      °[address]     °[place], [place] [address]                ° °[facility]   °[facility] °[phone] °[address] °[place], [place] [address] ° ° °[facility]          °[address],  °[place], [place] [address]       °([phone] ° °[facility] °[address] °[place], [place] [address] °336-292-1510 ° °[facility]    °[address]    °[place], [place] [address]     °[phone]      ° °Parish McKinney, MD     °[address]     °[place] [address]     °[phone]     °  °[facility] °[address] °[place] [address] ° °[facility]     °[address]     °[place], [place]      °336-542-2076      ° °[facility] °Shamsher Ahluwalia, MD °[address] °[place], [place] [address] °[phone] ° °[facility]     °[address]     °[place], [place] [address]     °336-274-1237      ° °[facility] °[address] °[place], [place] [address] °336-854-4450 °Resources for Temporary Residential Assistance/Crisis Centers ° °DAY CENTERS °[facility] °M-F 8am-3pm   °[address] [place], [place] [address]   336-332-0824 °Services include: laundry, barbering, support groups, case management, phone  & computer access, showers, AA/NA mtgs, mental health/substance abuse nurse, job skills class, disability information, VA assistance, spiritual classes, etc.  ° °HOMELESS SHELTERS ° °[facility]     °[facility]   °[address], [place]     °[phone]       °       °[facility] (women and children)       °[address] °[place], [place] [address] °336-275-0820 °[email or web address] for application and process °Application Required ° °[facility]   °[address]    °[place] [address]     °[phone]       °             °[facility] °[address] °[place], [place] [address] °336.273.5572 °336-235-0363(schedule application appt.) °Application Required ° °[facility] (women only)    °[address]     °[place], [place] [address]     °336-884-1039      °  Intake starts 6pm daily °Need valid ID, SSC, & Police report °[facility] [place] °[address] °[place], [place] °336-881-5420 °Application Required ° °[facility] (men only)     °[address]      °[place], [place]     °[phone]      ° °[facility] °(Pregnant women only) °[address] °[place], [place] °336-275-0206 ° °[facility]      °[address]      °[place], [place] [address]     °[phone]      °       °[facility] [place] [facility] °[address] °[place], [place] °336-723-1848 °90 day commitment/SA/Application process ° °[facility](men only)     °[address]     °[place], [place]     °[phone]       °Check-in at 7pm     °       °[facility] °[address] °[place],  [address] °336-248-6684 °Men/Women/Women and Children must be there by 7 pm ° °[facility] °[place],  °336-722-8721                ° °

## 2018-06-03 NOTE — ED Provider Notes (Signed)
[facility] EMERGENCY DEPARTMENT Provider Note   CSN: [id number] Arrival date & time: 06/03/18  0021     History   Chief Complaint Chief Complaint  Patient presents with  . Anxiety   Level 5 caveat due to psychiatric complaint HPI Barbara Rogers is a 37 y.o. female.  The history is provided by the patient.  Anxiety  This is a chronic problem. The problem occurs constantly. The problem has been gradually worsening. Nothing aggravates the symptoms. Nothing relieves the symptoms.  Patient presents for anxiety.  She reports had a lot of anxiety and all of her Xanax.  She initially denied any SI.  She told nursing staff that she was going to rough time  Past Medical History:  Diagnosis Date  . AKI (acute kidney injury) ([place]) 01/18/2015  . Allergy   . Anxiety   . Asthma    exacerbated by bronchitisi  . Asthma   . C3 cervical fracture ([place])   . C3 cervical fracture ([place]) 01/29/2016  . Cervical transverse process fracture ([place])   . Chronic left shoulder pain 08/2012  . Clavicle fracture   . Concussion 01/29/2016  . Depression   . 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 Dysmenorrhea   . Dysphagia   . Finger fracture, left 10/31/2012   LEFT 4th finger  . Insomnia   . Lumbar transverse process fracture ([place])   . MDD (major depressive disorder), recurrent episode, severe ([place]) 06/30/2016  . 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 Migraine without aura   . Mood disorder ([place])   . Nephrolithiasis   . OD (overdose of drug), intentional self-harm, initial encounter ([place]) 06/26/2016  . PMDD (premenstrual dysphoric disorder)   . Respiratory failure ([place])   . Seizure-like activity ([place]) 06/30/2016   normal EEG, question pseudoseizures  . T12 burst fracture ([place])   . Traumatic hemo-pneumothorax   . Traumatic hemopneumothorax 02/09/2016    Patient Active Problem List   Diagnosis Date Noted  . Multiple falls 12/06/2017  .  Short-term memory loss 12/06/2017  . GERD (gastroesophageal reflux disease) 12/06/2017  . Chronic back pain greater than 3 months duration 01/13/2017  . Polysubstance (excluding opioids) dependence, daily use ([place]) 07/01/2016  . Seizure-like activity ([place]) 06/29/2016  . MDD (major depressive disorder), recurrent severe, without psychosis ([place]) 06/27/2016  . Suicidal ideation 06/26/2016  . Bradycardia 06/26/2016  . Tobacco abuse   . Hypokalemia 01/18/2015  . Migraine without aura   . IUD (intrauterine device) in place 06/02/2013  . Hypothyroid 12/09/2012  . Chronic left shoulder pain   . Mood disorder ([place]) 03/25/2012  . AR (allergic rhinitis) 03/25/2012  . Dysmenorrhea 03/25/2012  . PMDD (premenstrual dysphoric disorder) 03/25/2012  . Chronic insomnia 03/25/2012    Past Surgical History:  Procedure Laterality Date  . FRACTURE SURGERY    . POSTERIOR LUMBAR FUSION 4 LEVEL N/A 01/30/2016   Procedure: T9-L2 Posterior Stabilization, Posterior Lumbar Fusion with Pedicle Screws;  Surgeon: Kary Kos, MD;  Location: [facility];  Service: Neurosurgery;  Laterality: N/A;     OB History    Gravida  3   Para  0   Term  0   Preterm  0   AB  3   Living        SAB  0   TAB  3   Ectopic  0   Multiple      Live Births  Home Medications    Prior to Admission medications   Medication Sig Start Date End Date Taking? Authorizing Provider  albuterol (PROAIR HFA) 108 (90 Base) MCG/ACT inhaler TAKE 2 PUFFS EVERY 4 HOURS AS NEEDED 02/28/18   Harrison Mons, PA  ALPRAZolam Duanne Moron) 1 MG tablet Take 1 mg by mouth 4 (four) times daily.    [provider]  amphetamine-dextroamphetamine (ADDERALL) 20 MG tablet Take 20 mg by mouth 3 (three) times daily.    [provider]  azelastine (ASTELIN) 0.1 % nasal spray Place 2 sprays into both nostrils 2 (two) times daily. Use in each nostril as directed 12/06/17   Harrison Mons, PA  azelastine (ASTELIN) 0.1  % nasal spray Place 2 sprays into both nostrils 2 (two) times daily. Use in each nostril as directed 05/11/18   Rutherford Guys, MD  chlordiazePOXIDE (LIBRIUM) 25 MG capsule TAKE 1 CAPSULE BY MOUTH 4 TIMES A DAY FOR 1 WEEK, THEN DISCONTINUE 02/27/18   [provider]  ciprofloxacin (CIPRO) 500 MG tablet Take 1 tablet (500 mg total) by mouth 2 (two) times daily. 05/11/18   Rutherford Guys, MD  dexlansoprazole (DEXILANT) 60 MG capsule Take 1 capsule (60 mg total) by mouth daily. 12/06/17   Harrison Mons, PA  divalproex (DEPAKOTE ER) 500 MG 24 hr tablet Take 1,500 mg by mouth at bedtime.     [provider]  gabapentin (NEURONTIN) 400 MG capsule Take 2 capsules (800 mg total) by mouth 3 (three) times daily. 05/08/18   Rutherford Guys, MD  HYDROcodone-acetaminophen (NORCO) 5-325 MG tablet Take 1 tablet by mouth every 4 (four) hours as needed. 05/16/18   Posey Boyer, MD  ibuprofen (ADVIL,MOTRIN) 800 MG tablet TAKE 1 TABLET BY MOUTH EVERY 8 HOURS AS NEEDED 02/26/18   Gale Journey, Damaris Hippo, PA-C  levonorgestrel (MIRENA) 20 MCG/24HR IUD 1 each by Intrauterine route once.    [provider]  levothyroxine (SYNTHROID, LEVOTHROID) 50 MCG tablet TAKE 1 TABLET BY MOUTH EVERY DAY 11/04/16   Harrison Mons, PA  lidocaine (LIDODERM) 5 % Place 3 patches onto the skin daily. Remove & Discard patch within 12 hours or as directed by provider 12/06/17   Harrison Mons, PA  ondansetron (ZOFRAN) 8 MG tablet Take 8 mg by mouth daily as needed for nausea.  11/11/16   [provider]  phenazopyridine (PYRIDIUM) 100 MG tablet Take 1 tablet (100 mg total) by mouth 3 (three) times daily as needed for pain. Patient not taking: Reported on 05/16/2018 05/11/18   Rutherford Guys, MD  polyethylene glycol powder (GLYCOLAX/MIRALAX) powder Take 17 g by mouth daily. Patient not taking: Reported on 05/16/2018 05/08/18   Rutherford Guys, MD  promethazine (PHENERGAN) 25 MG tablet Take 1 tablet (25 mg total) by mouth  every 8 (eight) hours as needed for nausea or vomiting. 05/08/18   Rutherford Guys, MD  tamsulosin (FLOMAX) 0.4 MG CAPS capsule Take 1 capsule (0.4 mg total) by mouth daily. 05/11/18   Rutherford Guys, MD  tiZANidine (ZANAFLEX) 4 MG tablet TAKE 3 TABLETS BY MOUTH 3 TIMES A DAY 02/26/18   Weber, Damaris Hippo, PA-C  traZODone (DESYREL) 150 MG tablet Take 150 mg by mouth at bedtime as needed. 04/12/17   [provider]  zolpidem (AMBIEN) 10 MG tablet Take 5-10 mg by mouth at bedtime as needed for sleep.    [provider]    Family History Family History  Problem Relation Age of Onset  . Migraines  Mother   . Mental illness Mother   . Heart disease Father   . Alcohol abuse Father   . Arthritis Maternal Grandmother   . Thyroid disease Maternal Grandmother   . Heart disease Maternal Grandfather        AMI 1996, 2014  . Anemia Maternal Grandfather        bone marrow dysfunction    Social History Social History   Tobacco Use  . Smoking status: Current Every Day Smoker    Packs/day: 1.00    Types: E-cigarettes  . Smokeless tobacco: Never Used  Substance Use Topics  . Alcohol use: Yes    Alcohol/week: 0.0 standard drinks    Comment: occasionally  . Drug use: Yes    Types: Marijuana     Allergies   Penicillins; Progestins; Progestins; Sinequan [doxepin]; and Sinequan [doxepin]   Review of Systems Review of Systems  Unable to perform ROS: Psychiatric disorder     Physical Exam Updated Vital Signs BP 106/80 (BP Location: Right Arm)   Pulse 97   Temp 98.3 F (36.8 C) (Oral)   Resp 16   LMP 05/13/2018   SpO2 94%   Physical Exam CONSTITUTIONAL: Anxious and disheveled HEAD: Normocephalic/atraumatic,no signs of trauma EYES: EOMI/PERRL pupils dilated ENMT: Mucous membranes moist NECK: supple no meningeal signs SPINE/BACK:entire spine nontender CV: S1/S2 noted, no murmurs/rubs/gallops noted LUNGS: Lungs are clear to auscultation bilaterally, no apparent  distress ABDOMEN: soft, nontender NEURO: Pt is awake/alert/appropriate, moves all extremitiesx4.  No facial droop.   EXTREMITIES: pulses normal/equal, full ROM, no deformities SKIN: warm, color normal PSYCH: Anxious ED Treatments / Results  Labs (all labs ordered are listed, but only abnormal results are displayed) Labs Reviewed - No data to display  EKG None  Radiology No results found.  Procedures Procedures (including critical care time)  Medications Ordered in ED Medications  LORazepam (ATIVAN) tablet 1 mg (1 mg Oral Given 06/03/18 0135)     Initial Impression / Assessment and Plan / ED Course  I have reviewed the triage vital signs and the nursing notes.      1:36 AM When I initially entered the room, patient was lying on her side.  No signs of falls.  Sitter near bedside reports there was no fall.  Patient reports she just laid on the floor.  No signs of any head or extremity trauma.  She was able to get up and walk around the room.  She was requesting to leave, and there was concern for possible SI.  I had a longer conversation with patient, and she denies any SI or HI.  She is concerned that she no longer has any of her Xanax, as well as she is unable to control her pain with ibuprofen.  I advised her I would be unable to refill any of her Xanax, and I would not be able to give her any pain medications. She has no ride home, and I do not feel comfortable at this time her walking out of the ER.  She does appear anxious at this time after waking up. For anxiety, I will give her 1 tablet of Ativan and requested that she continue to rest in the emergency department. 2:36 AM Patient was able to get a ride home with law enforcement.  She is awake and alert, no acute distress.  She is ambulatory.  Given referrals for outpatient. Final Clinical Impressions(s) / ED Diagnoses   Final diagnoses:  Anxiety    ED Discharge Orders  None       Ripley Fraise, MD 06/03/18  [phone]

## 2018-06-03 NOTE — ED Notes (Signed)
Pt now being belligerant. Stating " I want to kill myself ", " I want to die ". Pt observed hitting head against the door. ER provider aware. Security posted at door.

## 2018-06-03 NOTE — ED Notes (Signed)
ED Provider at bedside. 

## 2018-06-03 NOTE — ED Notes (Signed)
Pt denies suicidal ideation to doctor.

## 2018-06-03 NOTE — ED Notes (Signed)
Pt ambulatory to waiting room. Pt verbalized understanding of discharge instructions.   
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
## 2018-06-04 NOTE — Telephone Encounter (Signed)
error 

## 2018-06-08 ENCOUNTER — Telehealth: Payer: Self-pay | Admitting: Family Medicine

## 2018-06-08 NOTE — Telephone Encounter (Signed)
Pt brought forms in for Dr Pamella Pert to be completed,forms placed in [place] box    Best phone for pt is [phone]

## 2018-06-09 ENCOUNTER — Other Ambulatory Visit: Payer: Self-pay | Admitting: Physician Assistant

## 2018-06-09 DIAGNOSIS — G894 Chronic pain syndrome: Secondary | ICD-10-CM

## 2018-06-09 NOTE — Telephone Encounter (Signed)
Requested medication (s) are due for refill today yes  Requested medication (s) are on the active medication list yes  Future visit scheduled no   Requested Prescriptions  Pending Prescriptions Disp Refills   tiZANidine (ZANAFLEX) 4 MG tablet [Pharmacy Med Name: TIZANIDINE HCL 4 MG TABLET] 810 tablet 0    Sig: TAKE 3 TABLETS BY MOUTH 3 TIMES A DAY     Not Delegated - Cardiovascular:  Alpha-2 Agonists - tizanidine Failed - 06/09/2018 11:51 AM      Failed - This refill cannot be delegated      Passed - Valid encounter within last 6 months    Recent Outpatient Visits          3 weeks ago Nephrolithiasis   Primary Care at [facility], Fenton Malling, MD   1 month ago Nausea and vomiting, intractability of vomiting not specified, unspecified vomiting type   [facility] at [facility], Lilia Argue, MD   6 months ago Dysuria   Primary Care at [facility], [employer], [place]   1 year ago Acute pain of right shoulder   Primary Care at [facility], [place], [place]   1 year ago Chronic pain syndrome   Primary Care at [facility], [place], [place]

## 2018-06-10 NOTE — Telephone Encounter (Signed)
Paperwork scanned and faxed on 06/10/18

## 2018-06-12 ENCOUNTER — Telehealth: Payer: Self-pay | Admitting: Family Medicine

## 2018-06-12 NOTE — Telephone Encounter (Signed)
Please advise. Dgaddy, CMA 

## 2018-06-12 NOTE — Telephone Encounter (Signed)
Spoke to Christopher at [facility] and she stated the referral needs to be changed to transvaginal

## 2018-06-18 NOTE — Telephone Encounter (Signed)
Spoke with [facility].  Advised that since it was their employee Barbara Rogers that complicated this order 6 days ago asking for a transvaginal RENAL U/S that we feel [facility] can accommodate the pt as soon as possible.    They will call pt and schedule.

## 2018-06-18 NOTE — Telephone Encounter (Signed)
Melody with [facility] called back said she tried everything to fit patient in today or tomorrow but was unable to accomplish that task. She stated that if this is a stat issue please send patient to the hospital.

## 2018-06-18 NOTE — Telephone Encounter (Signed)
Unable to reach patient number is not valid.  If patient calls back please advise we are trying to book her an appointment at the hospital sooner than [facility] can get her in, but we are unable to reach her with number on file.

## 2018-06-18 NOTE — Telephone Encounter (Signed)
IC [facility] - spoke with Melody. Read Dr. Ardyth Gal message.  She agreed unsure why we were asked to change to transvaginal.  She will send to [facility] and have her schedule Renal U/S as referred asap.

## 2018-07-05 ENCOUNTER — Encounter (HOSPITAL_COMMUNITY): Payer: Self-pay | Admitting: Emergency Medicine

## 2018-07-05 ENCOUNTER — Emergency Department (HOSPITAL_COMMUNITY)
Admission: EM | Admit: 2018-07-05 | Discharge: 2018-07-05 | Disposition: A | Discharge status: Home / Self Care | Payer: BLUE CROSS/BLUE SHIELD | Attending: Emergency Medicine | Admitting: Emergency Medicine

## 2018-07-05 ENCOUNTER — Other Ambulatory Visit: Payer: Self-pay

## 2018-07-05 DIAGNOSIS — G4700 Insomnia, unspecified: Secondary | ICD-10-CM | POA: Diagnosis present

## 2018-07-05 DIAGNOSIS — Z79899 Other long term (current) drug therapy: Secondary | ICD-10-CM | POA: Insufficient documentation

## 2018-07-05 DIAGNOSIS — F17290 Nicotine dependence, other tobacco product, uncomplicated: Secondary | ICD-10-CM | POA: Insufficient documentation

## 2018-07-05 DIAGNOSIS — J45909 Unspecified asthma, uncomplicated: Secondary | ICD-10-CM | POA: Diagnosis not present

## 2018-07-05 DIAGNOSIS — F419 Anxiety disorder, unspecified: Secondary | ICD-10-CM | POA: Insufficient documentation

## 2018-07-05 DIAGNOSIS — E039 Hypothyroidism, unspecified: Secondary | ICD-10-CM | POA: Insufficient documentation

## 2018-07-05 DIAGNOSIS — F333 Major depressive disorder, recurrent, severe with psychotic symptoms: Secondary | ICD-10-CM | POA: Diagnosis not present

## 2018-07-05 LAB — CBC WITH DIFFERENTIAL/PLATELET
Abs Immature Granulocytes: 0.02 10*3/uL (ref 0.00–0.07)
BASOS ABS: 0.1 10*3/uL (ref 0.0–0.1)
Basophils Relative: 1 %
EOS PCT: 0 %
Eosinophils Absolute: 0 10*3/uL (ref 0.0–0.5)
HEMATOCRIT: 36.3 % (ref 36.0–46.0)
HEMOGLOBIN: 11.8 g/dL — AB (ref 12.0–15.0)
Immature Granulocytes: 0 %
LYMPHS ABS: 2.5 10*3/uL (ref 0.7–4.0)
LYMPHS PCT: 35 %
MCH: 29.5 pg (ref 26.0–34.0)
MCHC: 32.5 g/dL (ref 30.0–36.0)
MCV: 90.8 fL (ref 80.0–100.0)
MONO ABS: 0.6 10*3/uL (ref 0.1–1.0)
Monocytes Relative: 8 %
NRBC: 0 % (ref 0.0–0.2)
Neutro Abs: 4 10*3/uL (ref 1.7–7.7)
Neutrophils Relative %: 56 %
Platelets: 164 10*3/uL (ref 150–400)
RBC: 4 MIL/uL (ref 3.87–5.11)
RDW: 16.4 % — ABNORMAL HIGH (ref 11.5–15.5)
WBC: 7.1 10*3/uL (ref 4.0–10.5)

## 2018-07-05 LAB — VALPROIC ACID LEVEL: Valproic Acid Lvl: 13 ug/mL — ABNORMAL LOW (ref 50.0–100.0)

## 2018-07-05 LAB — COMPREHENSIVE METABOLIC PANEL
ALBUMIN: 3.6 g/dL (ref 3.5–5.0)
ALT: 10 U/L (ref 0–44)
AST: 18 U/L (ref 15–41)
Alkaline Phosphatase: 73 U/L (ref 38–126)
Anion gap: 10 (ref 5–15)
BUN: 17 mg/dL (ref 6–20)
CHLORIDE: 106 mmol/L (ref 98–111)
CO2: 23 mmol/L (ref 22–32)
CREATININE: 1.19 mg/dL — AB (ref 0.44–1.00)
Calcium: 8.3 mg/dL — ABNORMAL LOW (ref 8.9–10.3)
GFR calc non Af Amer: 58 mL/min — ABNORMAL LOW (ref >60)
GLUCOSE: 135 mg/dL — AB (ref 70–99)
Potassium: 3.3 mmol/L — ABNORMAL LOW (ref 3.5–5.1)
SODIUM: 139 mmol/L (ref 135–145)
Total Bilirubin: 0.5 mg/dL (ref 0.3–1.2)
Total Protein: 6.5 g/dL (ref 6.5–8.1)

## 2018-07-05 LAB — RAPID URINE DRUG SCREEN, HOSP PERFORMED
AMPHETAMINES: NOT DETECTED
BARBITURATES: NOT DETECTED
Benzodiazepines: NOT DETECTED
COCAINE: NOT DETECTED
OPIATES: NOT DETECTED
TETRAHYDROCANNABINOL: POSITIVE — AB

## 2018-07-05 LAB — ACETAMINOPHEN LEVEL: Acetaminophen (Tylenol), Serum: <10 ug/mL — ABNORMAL LOW (ref 10–30)

## 2018-07-05 LAB — ETHANOL: Alcohol, Ethyl (B): 97 mg/dL — ABNORMAL HIGH (ref <10)

## 2018-07-05 LAB — HCG, SERUM, QUALITATIVE: Preg, Serum: NEGATIVE

## 2018-07-05 LAB — SALICYLATE LEVEL: SALICYLATE LVL: 21.1 mg/dL (ref 2.8–30.0)

## 2018-07-05 MED ORDER — TRAZODONE HCL 50 MG PO TABS
150.0000 mg | ORAL_TABLET | Freq: Every evening | ORAL | Status: DC | PRN
  Filled 2018-07-05: qty 3

## 2018-07-05 MED ORDER — GABAPENTIN 400 MG PO CAPS
800.0000 mg | ORAL_CAPSULE | Freq: Three times a day (TID) | ORAL | Status: DC
  Administered 2018-07-05: 800 mg via ORAL
  Filled 2018-07-05: qty 2

## 2018-07-05 MED ORDER — LORAZEPAM 1 MG PO TABS
1.0000 mg | ORAL_TABLET | Freq: Once | ORAL | Status: AC
  Administered 2018-07-05: 1 mg via ORAL
  Filled 2018-07-05: qty 1

## 2018-07-05 MED ORDER — LEVOTHYROXINE SODIUM 50 MCG PO TABS
50.0000 ug | ORAL_TABLET | Freq: Every day | ORAL | Status: DC
  Administered 2018-07-05: 50 ug via ORAL
  Filled 2018-07-05: qty 1

## 2018-07-05 MED ORDER — ONDANSETRON HCL 4 MG PO TABS: 8.0000 mg | ORAL_TABLET | Freq: Every day | ORAL | Status: DC | PRN

## 2018-07-05 MED ORDER — IBUPROFEN 400 MG PO TABS: 400.0000 mg | ORAL_TABLET | Freq: Three times a day (TID) | ORAL | Status: DC | PRN

## 2018-07-05 MED ORDER — ALPRAZOLAM 0.5 MG PO TABS
1.0000 mg | ORAL_TABLET | Freq: Four times a day (QID) | ORAL | Status: DC
  Administered 2018-07-05: 1 mg via ORAL
  Filled 2018-07-05: qty 2

## 2018-07-05 MED ORDER — IBUPROFEN 800 MG PO TABS
800.0000 mg | ORAL_TABLET | Freq: Four times a day (QID) | ORAL | Status: DC | PRN
  Administered 2018-07-05: 800 mg via ORAL
  Filled 2018-07-05: qty 1

## 2018-07-05 MED ORDER — DIVALPROEX SODIUM ER 500 MG PO TB24: 1500.0000 mg | ORAL_TABLET | Freq: Every day | ORAL | Status: DC

## 2018-07-05 NOTE — BH Assessment (Signed)
Tele Assessment Note   Patient Name: Barbara Rogers MRN: [id number] Referring Physician: EDP Location of Patient: [facility] Location of Provider: [place] is a 37 y.o. female who presented to [facility] on voluntary basis with complaint of insomnia.  Pt lives in [place] by herself, and she is on disability due to chronic pain arising from a broken back several years ago.  Pt was last assessed by TTS in 2017 -- at that time, Pt attempted suicide by overdose due to body pain.  Pt is treated outpatient by Dr. Toy Care.  Pt arrived at the ED by ambulance.  Pt reported that for the last two weeks, she has experienced significant insomnia, and that over the last four days, she has had no sleep at all.  Pt also reported body tremors, particularly in hands.  Pt denied current suicidal ideation, homicidal ideation, hallucination.  Pt's BAC was .97 on admission, and she acknowledged drinking beer to bring on sleep.  Pt has a history of marijuana use, but UDS was clear.  Pt stated that she is compliant with medication (including Depakote and Seroquel).  When asked what she would like to do today, Pt stated that she wanted to go home because she does not feel comfortable coming to [facility].    During assessment, Pt presented as alert and oriented.  She had good eye contact and was cooperative.  Pt was in scrubs, and she appeared appropriately groomed.  Pt's mood was anxious and preoccupied, and affect was mood-congruent.  Pt endorsed significant insomnia, despondency due to insomnia, and irritability.  Pt's speech was normal in rate and rhythm, and volume was within normal range.  Thought content was logical and goal-oriented.  There was no evidence of delusion.  Pt's memory and concentration were intact.  Insight, judgment, and impulse control were fair.    Consulted with S. Rankin, NP, who also spoke with Pt.  Pt is now psych-cleared.   Diagnosis: F33.1 MDD, Recurrent, Moderate; r/o Bipolar  Disorder  Past Medical History:  Past Medical History:  Diagnosis Date  . AKI (acute kidney injury) ([place]) 01/18/2015  . Allergy   . Anxiety   . Asthma    exacerbated by bronchitisi  . Asthma   . C3 cervical fracture ([place])   . C3 cervical fracture ([place]) 01/29/2016  . Cervical transverse process fracture ([place])   . Chronic left shoulder pain 08/2012  . Clavicle fracture   . Concussion 01/29/2016  . Depression   . 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 Dysmenorrhea   . Dysphagia   . Finger fracture, left 10/31/2012   LEFT 4th finger  . Insomnia   . Lumbar transverse process fracture ([place])   . MDD (major depressive disorder), recurrent episode, severe ([place]) 06/30/2016  . 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 Migraine without aura   . Mood disorder ([place])   . Nephrolithiasis   . OD (overdose of drug), intentional self-harm, initial encounter ([place]) 06/26/2016  . PMDD (premenstrual dysphoric disorder)   . Respiratory failure ([place])   . Seizure-like activity ([place]) 06/30/2016   normal EEG, question pseudoseizures  . T12 burst fracture ([place])   . Traumatic hemo-pneumothorax   . Traumatic hemopneumothorax 02/09/2016    Past Surgical History:  Procedure Laterality Date  . FRACTURE SURGERY    . POSTERIOR LUMBAR FUSION 4 LEVEL N/A 01/30/2016   Procedure: T9-L2 Posterior Stabilization, Posterior Lumbar Fusion with Pedicle Screws;  Surgeon: Kary Kos,  MD;  Location: [place] [facility];  Service: Neurosurgery;  Laterality: N/A;    Family History:  Family History  Problem Relation Age of Onset  . Migraines Mother   . Mental illness Mother   . Heart disease Father   . Alcohol abuse Father   . Arthritis Maternal Grandmother   . Thyroid disease Maternal Grandmother   . Heart disease Maternal Grandfather        AMI 1996, 2014  . Anemia Maternal Grandfather        bone marrow dysfunction    Social History:  reports that she has been  smoking e-cigarettes. She has been smoking about 1.00 pack per day. She has never used smokeless tobacco. She reports that she drinks about 3.0 standard drinks of alcohol per week. She reports that she has current or past drug history. Drug: Marijuana.  Additional Social History:  Alcohol / Drug Use Pain Medications: See MAR Prescriptions: See MAR Over the Counter: See MAR History of alcohol / drug use?: Yes Substance #1 Name of Substance 1: Alcohol 1 - Amount (size/oz): varied 1 - Frequency: Weekly 1 - Duration: Ongoing 1 - Last Use / Amount: 07/04/18  CIWA: CIWA-Ar BP: 98/82 Pulse Rate: 89 COWS:    Allergies:  Allergies  Allergen Reactions  . Penicillins Other (See Comments)    Unknown Has patient had a PCN reaction causing immediate rash, facial/tongue/throat swelling, SOB or lightheadedness with hypotension:  unknown Has patient had a PCN reaction causing severe rash involving mucus membranes or skin necrosis:  unknown Has patient had a PCN reaction that required hospitalization  unknown Has patient had a PCN reaction occurring within the last 10 years: unknown If all of the above answers are "NO", the  . Progestins     Extreme moodiness  . Progestins Other (See Comments)    Extreme moodiness  . Sinequan [Doxepin] Other (See Comments) and Hypertension    Hallucinations, delusions, severe agitation  . Sinequan [Doxepin] Other (See Comments)    hallucinations    Home Medications:  (Not in a hospital admission)  OB/GYN Status:  No LMP recorded. (Menstrual status: IUD).  General Assessment Data [facility] of Assessment: [facility] TTS Assessment: In system Is this a Tele or Face-to-Face Assessment?: Tele Assessment Is this an Initial Assessment or a Re-assessment for this encounter?: Initial Assessment Patient Accompanied by:: N/A Language Other than English: No Living Arrangements: Other (Comment)(Lives alone) What gender do you identify as?: Female Marital status:  Single Pregnancy Status: No Living Arrangements: Alone Can pt return to current living arrangement?: Yes Admission Status: Voluntary Is patient capable of signing voluntary admission?: Yes Referral Source: Self/Family/Friend Insurance type: BCBS     Crisis Care Plan Living Arrangements: Alone Name of Psychiatrist: Dr. Toy Care  Education Status Is patient currently in school?: No Is the patient employed, unemployed or receiving disability?: Receiving disability income(Chronic pain due to )  Risk to self with the past 6 months Suicidal Ideation: No Has patient been a risk to self within the past 6 months prior to admission? : No Suicidal Intent: No Has patient had any suicidal intent within the past 6 months prior to admission? : No Is patient at risk for suicide?: No Suicidal Plan?: No Has patient had any suicidal plan within the past 6 months prior to admission? : No Access to Means: No What has been your use of drugs/alcohol within the last 12 months?: Alcohol (beer); marijuana Previous Attempts/Gestures: Yes How many times?: 1 Triggers for Past Attempts:  Other (Comment)(Body pain) Intentional Self Injurious Behavior: None Family Suicide History: Unknown Recent stressful life event(s): Recent negative physical changes, Other (Comment)(Insomnia) Persecutory voices/beliefs?: No Depression: Yes Depression Symptoms: Despondent, Insomnia, Feeling angry/irritable Substance abuse history and/or treatment for substance abuse?: Yes Suicide prevention information given to non-admitted patients: Not applicable  Risk to Others within the past 6 months Homicidal Ideation: No Does patient have any lifetime risk of violence toward others beyond the six months prior to admission? : No Thoughts of Harm to Others: No Current Homicidal Intent: No Current Homicidal Plan: No Access to Homicidal Means: No History of harm to others?: No Assessment of Violence: None Noted Does patient have  access to weapons?: No Criminal Charges Pending?: No Does patient have a court date: No Is patient on probation?: Unknown  Psychosis Hallucinations: None noted Delusions: None noted  Mental Status Report Appearance/Hygiene: In scrubs, Other (Comment) Eye Contact: Good Motor Activity: Freedom of movement Speech: Logical/coherent, Unremarkable Level of Consciousness: Alert, Restless Mood: Anxious, Preoccupied Affect: Appropriate to circumstance Anxiety Level: Moderate Thought Processes: Coherent, Relevant Judgement: Partial Orientation: Person, Place, Time, Situation Obsessive Compulsive Thoughts/Behaviors: None  Cognitive Functioning Concentration: Normal Memory: Recent Intact, Remote Intact Is patient IDD: No Insight: Fair Impulse Control: Fair Appetite: Good Have you had any weight changes? : No Change Sleep: Decreased Total Hours of Sleep: (''No sleep in four days'') Vegetative Symptoms: None  ADLScreening [facility]) Patient's cognitive ability adequate to safely complete daily activities?: Yes Patient able to express need for assistance with ADLs?: Yes Independently performs ADLs?: Yes (appropriate for developmental age)  Prior Inpatient Therapy Prior Inpatient Therapy: Yes Prior Therapy Dates: 2017 Prior Therapy Facilty/Provider(s): [facility] Reason for Treatment: Suicide attempt  Prior Outpatient Therapy Prior Outpatient Therapy: Yes Prior Therapy Dates: Ongoing Prior Therapy Facilty/Provider(s): Dr. Toy Care Reason for Treatment: Depression Does patient have an ACCT team?: No Does patient have Intensive In-House Services?  : No Does patient have [facility] services? : No Does patient have P4CC services?: No  ADL Screening (condition at time of admission) Patient's cognitive ability adequate to safely complete daily activities?: Yes Is the patient deaf or have difficulty hearing?: No Does the patient have difficulty seeing, even when wearing  glasses/contacts?: No Does the patient have difficulty concentrating, remembering, or making decisions?: No Patient able to express need for assistance with ADLs?: Yes Does the patient have difficulty dressing or bathing?: No Independently performs ADLs?: Yes (appropriate for developmental age) Does the patient have difficulty walking or climbing stairs?: Yes(Recovered from broken back; walking is difficult) Weakness of Legs: None Weakness of Arms/Hands: None  Home Assistive Devices/Equipment Home Assistive Devices/Equipment: None  Therapy Consults (therapy consults require a physician order) PT Evaluation Needed: No OT Evalulation Needed: No SLP Evaluation Needed: No Abuse/Neglect Assessment (Assessment to be complete while patient is alone) Abuse/Neglect Assessment Can Be Completed: Yes Physical Abuse: Denies Verbal Abuse: Denies Sexual Abuse: Denies Exploitation of patient/patient's resources: Denies Self-Neglect: Denies Values / Beliefs Cultural Requests During Hospitalization: None Spiritual Requests During Hospitalization: None Consults Spiritual Care Consult Needed: No Social Work Consult Needed: No Regulatory affairs officer (For Healthcare) Does Patient Have a Medical Advance Directive?: No          Disposition:  Disposition Initial Assessment Completed for this Encounter: Yes Disposition of Patient: Discharge(Per S. Rankin, NP, Pt to be discharged)  This service was provided via telemedicine using a 2-way, interactive audio and Radiographer, therapeutic.  Names of all persons participating in this telemedicine service and their role in this  encounter. Name: [place], Scherger Role: Patient             Marlowe Aschoff 07/05/2018 9:47 AM

## 2018-07-05 NOTE — ED Notes (Signed)
Pt requesting medication to help her sleep. Brought in Trazodone per PRN order. Pt refused medication stating, "it gives me horrible nightmares."

## 2018-07-05 NOTE — ED Provider Notes (Signed)
Patient cleared by behavioral health for discharge home they have arranged follow-up.   Fredia Sorrow, MD 07/05/18 [phone]

## 2018-07-05 NOTE — ED Notes (Signed)
Pt given breakfast meal tray. 

## 2018-07-05 NOTE — ED Provider Notes (Signed)
[facility] EMERGENCY DEPARTMENT Provider Note   CSN: [id number] Arrival date & time: 07/05/18  0354     History   Chief Complaint Chief Complaint  Patient presents with  . Insomnia    HPI Barbara Rogers is a 37 y.o. female.  The history is provided by the patient.  Insomnia  This is a new problem. The current episode started more than 2 days ago. The problem occurs daily. The problem has been gradually worsening. Pertinent negatives include no chest pain and no abdominal pain. Nothing aggravates the symptoms. Nothing relieves the symptoms.   Patient with history of chronic pain, depression presents with insomnia.  She reports it has been at least 4 nights since she has had any sleep.  She feels very tremulous and anxious.  She admits to alcohol abuse, last drink was over 8 hours ago.  Denies drug abuse, but does take prescribed medications.  She is worried she may be having a mental health breakdown. Past Medical History:  Diagnosis Date  . AKI (acute kidney injury) ([place]) 01/18/2015  . Allergy   . Anxiety   . Asthma    exacerbated by bronchitisi  . Asthma   . C3 cervical fracture ([place])   . C3 cervical fracture ([place]) 01/29/2016  . Cervical transverse process fracture ([place])   . Chronic left shoulder pain 08/2012  . Clavicle fracture   . Concussion 01/29/2016  . Depression   . 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 Dysmenorrhea   . Dysphagia   . Finger fracture, left 10/31/2012   LEFT 4th finger  . Insomnia   . Lumbar transverse process fracture ([place])   . MDD (major depressive disorder), recurrent episode, severe ([place]) 06/30/2016  . 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 Migraine without aura   . Mood disorder ([place])   . Nephrolithiasis   . OD (overdose of drug), intentional self-harm, initial encounter ([place]) 06/26/2016  . PMDD (premenstrual dysphoric disorder)   . Respiratory failure ([place])   . Seizure-like  activity ([place]) 06/30/2016   normal EEG, question pseudoseizures  . T12 burst fracture ([place])   . Traumatic hemo-pneumothorax   . Traumatic hemopneumothorax 02/09/2016    Patient Active Problem List   Diagnosis Date Noted  . Multiple falls 12/06/2017  . Short-term memory loss 12/06/2017  . GERD (gastroesophageal reflux disease) 12/06/2017  . Chronic back pain greater than 3 months duration 01/13/2017  . Polysubstance (excluding opioids) dependence, daily use ([place]) 07/01/2016  . Seizure-like activity ([place]) 06/29/2016  . MDD (major depressive disorder), recurrent severe, without psychosis ([place]) 06/27/2016  . Suicidal ideation 06/26/2016  . Bradycardia 06/26/2016  . Tobacco abuse   . Hypokalemia 01/18/2015  . Migraine without aura   . IUD (intrauterine device) in place 06/02/2013  . Hypothyroid 12/09/2012  . Chronic left shoulder pain   . Mood disorder ([place]) 03/25/2012  . AR (allergic rhinitis) 03/25/2012  . Dysmenorrhea 03/25/2012  . PMDD (premenstrual dysphoric disorder) 03/25/2012  . Chronic insomnia 03/25/2012    Past Surgical History:  Procedure Laterality Date  . FRACTURE SURGERY    . POSTERIOR LUMBAR FUSION 4 LEVEL N/A 01/30/2016   Procedure: T9-L2 Posterior Stabilization, Posterior Lumbar Fusion with Pedicle Screws;  Surgeon: Kary Kos, MD;  Location: [facility];  Service: Neurosurgery;  Laterality: N/A;     OB History    Gravida  3   Para  0   Term  0   Preterm  0  AB  3   Living        SAB  0   TAB  3   Ectopic  0   Multiple      Live Births               Home Medications    Prior to Admission medications   Medication Sig Start Date End Date Taking? Authorizing Provider  albuterol (PROAIR HFA) 108 (90 Base) MCG/ACT inhaler TAKE 2 PUFFS EVERY 4 HOURS AS NEEDED 02/28/18   Harrison Mons, PA  ALPRAZolam Duanne Moron) 1 MG tablet Take 1 mg by mouth 4 (four) times daily.    [provider]  amphetamine-dextroamphetamine (ADDERALL) 20 MG  tablet Take 20 mg by mouth 3 (three) times daily.    [provider]  azelastine (ASTELIN) 0.1 % nasal spray Place 2 sprays into both nostrils 2 (two) times daily. Use in each nostril as directed 12/06/17   Harrison Mons, PA  azelastine (ASTELIN) 0.1 % nasal spray Place 2 sprays into both nostrils 2 (two) times daily. Use in each nostril as directed 05/11/18   Rutherford Guys, MD  dexlansoprazole (DEXILANT) 60 MG capsule Take 1 capsule (60 mg total) by mouth daily. 12/06/17   Harrison Mons, PA  divalproex (DEPAKOTE ER) 500 MG 24 hr tablet Take 1,500 mg by mouth at bedtime.     [provider]  gabapentin (NEURONTIN) 400 MG capsule Take 2 capsules (800 mg total) by mouth 3 (three) times daily. 05/08/18   Rutherford Guys, MD  HYDROcodone-acetaminophen (NORCO) 5-325 MG tablet Take 1 tablet by mouth every 4 (four) hours as needed. 05/16/18   Posey Boyer, MD  ibuprofen (ADVIL,MOTRIN) 800 MG tablet TAKE 1 TABLET BY MOUTH EVERY 8 HOURS AS NEEDED 02/26/18   Gale Journey, Damaris Hippo, PA-C  levonorgestrel (MIRENA) 20 MCG/24HR IUD 1 each by Intrauterine route once.    [provider]  levothyroxine (SYNTHROID, LEVOTHROID) 50 MCG tablet TAKE 1 TABLET BY MOUTH EVERY DAY 11/04/16   Harrison Mons, PA  lidocaine (LIDODERM) 5 % Place 3 patches onto the skin daily. Remove & Discard patch within 12 hours or as directed by provider 12/06/17   Harrison Mons, PA  ondansetron (ZOFRAN) 8 MG tablet Take 8 mg by mouth daily as needed for nausea.  11/11/16   [provider]  promethazine (PHENERGAN) 25 MG tablet Take 1 tablet (25 mg total) by mouth every 8 (eight) hours as needed for nausea or vomiting. 05/08/18   Rutherford Guys, MD  tamsulosin (FLOMAX) 0.4 MG CAPS capsule Take 1 capsule (0.4 mg total) by mouth daily. 05/11/18   Rutherford Guys, MD  tiZANidine (ZANAFLEX) 4 MG tablet TAKE 3 TABLETS BY MOUTH 3 TIMES A DAY 02/26/18   Weber, Damaris Hippo, PA-C  traZODone (DESYREL) 150 MG tablet Take 150 mg by  mouth at bedtime as needed. 04/12/17   [provider]  zolpidem (AMBIEN) 10 MG tablet Take 5-10 mg by mouth at bedtime as needed for sleep.    [provider]    Family History Family History  Problem Relation Age of Onset  . Migraines Mother   . Mental illness Mother   . Heart disease Father   . Alcohol abuse Father   . Arthritis Maternal Grandmother   . Thyroid disease Maternal Grandmother   . Heart disease Maternal Grandfather        AMI 1996, 2014  . Anemia Maternal Grandfather  bone marrow dysfunction    Social History Social History   Tobacco Use  . Smoking status: Current Every Day Smoker    Packs/day: 1.00    Types: E-cigarettes  . Smokeless tobacco: Never Used  Substance Use Topics  . Alcohol use: Yes    Alcohol/week: 0.0 standard drinks    Comment: occasionally  . Drug use: Yes    Types: Marijuana     Allergies   Penicillins; Progestins; Progestins; Sinequan [doxepin]; and Sinequan [doxepin]   Review of Systems Review of Systems  Constitutional: Negative for fever.  Cardiovascular: Negative for chest pain.  Gastrointestinal: Negative for abdominal pain.  Musculoskeletal: Positive for back pain.       Chronic back pain  Psychiatric/Behavioral: Positive for sleep disturbance. The patient is nervous/anxious and has insomnia.   All other systems reviewed and are negative.    Physical Exam Updated Vital Signs BP 98/82 (BP Location: Left Arm)   Pulse 89   Temp 98.5 F (36.9 C) (Oral)   Resp 16   SpO2 98%   Physical Exam  CONSTITUTIONAL: Disheveled and anxious HEAD: Normocephalic/atraumatic EYES: EOMI/PERRL ENMT: Mucous membranes moist NECK: supple no meningeal signs SPINE/BACK:entire spine nontender CV: S1/S2 noted, no murmurs/rubs/gallops noted LUNGS: Lungs are clear to auscultation bilaterally, no apparent distress ABDOMEN: soft, nontender GU:no cva tenderness NEURO: Pt is awake/alert/appropriate, moves all  extremitiesx4.  No facial droop.  Hand tremor noted EXTREMITIES: pulses normal/equal, full ROM SKIN: warm, color normal PSYCH: Anxious ED Treatments / Results  Labs (all labs ordered are listed, but only abnormal results are displayed) Labs Reviewed  COMPREHENSIVE METABOLIC PANEL - Abnormal; Notable for the following components:      Result Value   Potassium 3.3 (*)    Glucose, Bld 135 (*)    Creatinine, Ser 1.19 (*)    Calcium 8.3 (*)    GFR calc non Af Amer 58 (*)    All other components within normal limits  CBC WITH DIFFERENTIAL/PLATELET - Abnormal; Notable for the following components:   Hemoglobin 11.8 (*)    RDW 16.4 (*)    All other components within normal limits  RAPID URINE DRUG SCREEN, HOSP PERFORMED - Abnormal; Notable for the following components:   Tetrahydrocannabinol POSITIVE (*)    All other components within normal limits  ETHANOL - Abnormal; Notable for the following components:   Alcohol, Ethyl (B) 97 (*)    All other components within normal limits  ACETAMINOPHEN LEVEL - Abnormal; Notable for the following components:   Acetaminophen (Tylenol), Serum <10 (*)    All other components within normal limits  VALPROIC ACID LEVEL - Abnormal; Notable for the following components:   Valproic Acid Lvl 13 (*)    All other components within normal limits  SALICYLATE LEVEL  HCG, SERUM, QUALITATIVE    EKG None  Radiology No results found.  Procedures Procedures  Medications Ordered in ED Medications  divalproex (DEPAKOTE ER) 24 hr tablet 1,500 mg (has no administration in time range)  gabapentin (NEURONTIN) capsule 800 mg (has no administration in time range)  levothyroxine (SYNTHROID, LEVOTHROID) tablet 50 mcg (has no administration in time range)  ondansetron (ZOFRAN) tablet 8 mg (has no administration in time range)  traZODone (DESYREL) tablet 150 mg (has no administration in time range)  ibuprofen (ADVIL,MOTRIN) tablet 800 mg (800 mg Oral Given 07/05/18  0649)  ALPRAZolam (XANAX) tablet 1 mg (has no administration in time range)  LORazepam (ATIVAN) tablet 1 mg (1 mg Oral Given 07/05/18 0451)  Initial Impression / Assessment and Plan / ED Course  I have reviewed the triage vital signs and the nursing notes.  Pertinent labs  results that were available during my care of the patient were reviewed by me and considered in my medical decision making (see chart for details).     7:52 AM She has been resting in the ER comfortably.  She appears improved.  She is awaiting for tele-psych evaluation.  Labs do reveal salicylate level in the therapeutic range, has no other acute findings.  Patient reports she did take aspirin yesterday twice for her back pain, it was not a suicide attempt.  Awaiting psych recommendation, patient medically stable  Final Clinical Impressions(s) / ED Diagnoses   Final diagnoses:  Anxiety  Insomnia, unspecified type    ED Discharge Orders    None       Ripley Fraise, MD 07/05/18 [phone]

## 2018-07-05 NOTE — ED Notes (Signed)
TTS consult in process. 

## 2018-07-05 NOTE — Discharge Instructions (Addendum)
Follow-up as per behavioral health. °

## 2018-07-05 NOTE — Consult Note (Signed)
Tele psych Assessment   Barbara Rogers, 37 y.o., female patient seen via tele psych by TTS and this provider; chart reviewed and consulted with Dr. Dwyane Dee on 07/05/18.  On evaluation Barbara Rogers reports she has been having problems with sleep for 2 weeks and it has worsened over the last 4 days.  Patient states that she has been off of her Depakote ER for 3 months and just restarted it yesterday.  States that she stopped taking it because it made her feel drunk.  Patient informed if she has been off of her medication for 3 months not to restart at 1500 mg.  Patient instructed to take one 500 mg tablet Q hs and Wednesday increase to 1000 mg Q hs.  Patient also instructed to Call Dr Chucky May (psychiatrist) and let her know what is going on with her medication and to have her Depakote level recheck to see if she had gotten to therapeutic level.  Can also discussed with Dr Chucky May if there are other medications that may work better.  Patient also states that she has not been taking her Trazodone at bed time because she feels fatigued in the mornings.  Patient informed that she may need to take it earlier in the night and if it is a 50 mg tablet she can take 50 mg 1 hour before bedtime and repeat in an hour if she has not gone to sleep.  Patient denies suicidal/self-harm/homicidal ideation, psychosis, and paranoia.   During evaluation Barbara Rogers is alert/oriented x 4; calm/cooperative; and mood is congruent with affect.  She does not appear to be responding to internal/external stimuli or delusional thoughts; and denies suicidal/self-harm/homicidal ideation, psychosis, and paranoia.  Patient answered question appropriately.  Patient is to follow up with current outpatient phychiatric  Provider (Dr Chucky May) and encouraged to take her medications.    For detailed note see TTS tele assessment note  Recommendations:  Patient is to follow up with current outpatient phychiatric  Provider (Dr Chucky May) and encouraged to take her medications.   Disposition:  Patient is psychiatrically cleared No evidence of imminent risk to self or others at present.    Patient does not meet criteria for psychiatric inpatient admission. Supportive therapy provided about ongoing stressors. Refer to IOP. Discussed crisis plan, support from social network, calling 911, coming to the Emergency Department, and calling Suicide Hotline.   Spoke with Dr. Rogene Houston; informed of above recommendation and disposition  Barbara Newport, NP

## 2018-07-05 NOTE — ED Notes (Signed)
TTS machine placed at bedside.   

## 2018-07-05 NOTE — ED Triage Notes (Signed)
Pt states that she feels like she is about to have a mental health break down. Pt states that she has not been able to sleep for 5 days straight.

## 2018-07-20 ENCOUNTER — Emergency Department (HOSPITAL_COMMUNITY): Payer: BLUE CROSS/BLUE SHIELD

## 2018-07-20 ENCOUNTER — Encounter (HOSPITAL_COMMUNITY): Payer: Self-pay | Admitting: Emergency Medicine

## 2018-07-20 ENCOUNTER — Other Ambulatory Visit: Payer: Self-pay

## 2018-07-20 ENCOUNTER — Emergency Department (HOSPITAL_COMMUNITY)
Admission: EM | Admit: 2018-07-20 | Discharge: 2018-07-20 | Disposition: A | Discharge status: Home / Self Care | Payer: BLUE CROSS/BLUE SHIELD | Attending: Emergency Medicine | Admitting: Emergency Medicine

## 2018-07-20 DIAGNOSIS — W231XXA Caught, crushed, jammed, or pinched between stationary objects, initial encounter: Secondary | ICD-10-CM | POA: Insufficient documentation

## 2018-07-20 DIAGNOSIS — Z79899 Other long term (current) drug therapy: Secondary | ICD-10-CM | POA: Diagnosis not present

## 2018-07-20 DIAGNOSIS — Y939 Activity, unspecified: Secondary | ICD-10-CM | POA: Insufficient documentation

## 2018-07-20 DIAGNOSIS — Y929 Unspecified place or not applicable: Secondary | ICD-10-CM | POA: Diagnosis not present

## 2018-07-20 DIAGNOSIS — J45909 Unspecified asthma, uncomplicated: Secondary | ICD-10-CM | POA: Diagnosis not present

## 2018-07-20 DIAGNOSIS — Y999 Unspecified external cause status: Secondary | ICD-10-CM | POA: Insufficient documentation

## 2018-07-20 DIAGNOSIS — F17200 Nicotine dependence, unspecified, uncomplicated: Secondary | ICD-10-CM | POA: Diagnosis not present

## 2018-07-20 DIAGNOSIS — S62656A Nondisplaced fracture of medial phalanx of right little finger, initial encounter for closed fracture: Secondary | ICD-10-CM | POA: Diagnosis not present

## 2018-07-20 DIAGNOSIS — S6991XA Unspecified injury of right wrist, hand and finger(s), initial encounter: Secondary | ICD-10-CM | POA: Diagnosis present

## 2018-07-20 MED ORDER — HYDROCODONE-ACETAMINOPHEN 5-325 MG PO TABS: 1.0000 | ORAL_TABLET | ORAL | 0 refills | Status: DC | PRN

## 2018-07-20 MED ORDER — NAPROXEN 500 MG PO TABS: 500.0000 mg | ORAL_TABLET | Freq: Two times a day (BID) | ORAL | 0 refills | Status: DC

## 2018-07-20 MED ORDER — TRAMADOL HCL 50 MG PO TABS: 50.0000 mg | ORAL_TABLET | Freq: Once | ORAL | Status: DC

## 2018-07-20 MED ORDER — NAPROXEN 250 MG PO TABS
500.0000 mg | ORAL_TABLET | Freq: Once | ORAL | Status: AC
  Administered 2018-07-20: 500 mg via ORAL
  Filled 2018-07-20: qty 2

## 2018-07-20 MED ORDER — HYDROCODONE-ACETAMINOPHEN 5-325 MG PO TABS
1.0000 | ORAL_TABLET | Freq: Once | ORAL | Status: AC
  Administered 2018-07-20: 1 via ORAL
  Filled 2018-07-20: qty 1

## 2018-07-20 NOTE — ED Triage Notes (Signed)
Pt states she slammed her fifth digit in a car door yesterday on the R hand.

## 2018-07-20 NOTE — ED Provider Notes (Signed)
[facility] EMERGENCY DEPARTMENT Provider Note   CSN: [id number] Arrival date & time: 07/20/18  1648     History   Chief Complaint Chief Complaint  Patient presents with  . Finger Injury    HPI Barbara Rogers is a 37 y.o. female, right handed, presenting with right 5th finger pain, swelling and bruising since accidentally slamming the finger in a car door yesterday.  She has applied ice, elevation and bc powders orally with worsening pain today.  She works as a Training and development officer and has concerns about being able to perform her job.  She denies numbness in the finger and no hand pain.    The history is provided by the patient.    Past Medical History:  Diagnosis Date  . AKI (acute kidney injury) ([place]) 01/18/2015  . Allergy   . Anxiety   . Asthma    exacerbated by bronchitisi  . Asthma   . C3 cervical fracture ([place])   . C3 cervical fracture ([place]) 01/29/2016  . Cervical transverse process fracture ([place])   . Chronic left shoulder pain 08/2012  . Clavicle fracture   . Concussion 01/29/2016  . Depression   . 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 Dysmenorrhea   . Dysphagia   . Finger fracture, left 10/31/2012   LEFT 4th finger  . Insomnia   . Lumbar transverse process fracture ([place])   . MDD (major depressive disorder), recurrent episode, severe ([place]) 06/30/2016  . 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 Migraine without aura   . Mood disorder ([place])   . Nephrolithiasis   . OD (overdose of drug), intentional self-harm, initial encounter ([place]) 06/26/2016  . PMDD (premenstrual dysphoric disorder)   . Respiratory failure ([place])   . Seizure-like activity ([place]) 06/30/2016   normal EEG, question pseudoseizures  . T12 burst fracture ([place])   . Traumatic hemo-pneumothorax   . Traumatic hemopneumothorax 02/09/2016    Patient Active Problem List   Diagnosis Date Noted  . Multiple falls 12/06/2017  . Short-term  memory loss 12/06/2017  . GERD (gastroesophageal reflux disease) 12/06/2017  . Chronic back pain greater than 3 months duration 01/13/2017  . Polysubstance (excluding opioids) dependence, daily use ([place]) 07/01/2016  . Seizure-like activity ([place]) 06/29/2016  . MDD (major depressive disorder), recurrent severe, without psychosis ([place]) 06/27/2016  . Suicidal ideation 06/26/2016  . Bradycardia 06/26/2016  . Tobacco abuse   . Hypokalemia 01/18/2015  . Migraine without aura   . IUD (intrauterine device) in place 06/02/2013  . Hypothyroid 12/09/2012  . Chronic left shoulder pain   . Mood disorder ([place]) 03/25/2012  . AR (allergic rhinitis) 03/25/2012  . Dysmenorrhea 03/25/2012  . PMDD (premenstrual dysphoric disorder) 03/25/2012  . Chronic insomnia 03/25/2012    Past Surgical History:  Procedure Laterality Date  . FRACTURE SURGERY    . POSTERIOR LUMBAR FUSION 4 LEVEL N/A 01/30/2016   Procedure: T9-L2 Posterior Stabilization, Posterior Lumbar Fusion with Pedicle Screws;  Surgeon: Kary Kos, MD;  Location: [facility];  Service: Neurosurgery;  Laterality: N/A;     OB History    Gravida  3   Para  0   Term  0   Preterm  0   AB  3   Living        SAB  0   TAB  3   Ectopic  0   Multiple      Live Births  Home Medications    Prior to Admission medications   Medication Sig Start Date End Date Taking? Authorizing Provider  albuterol (PROAIR HFA) 108 (90 Base) MCG/ACT inhaler TAKE 2 PUFFS EVERY 4 HOURS AS NEEDED 02/28/18   Harrison Mons, PA  ALPRAZolam Duanne Moron) 1 MG tablet Take 1 mg by mouth 4 (four) times daily.    [provider]  amphetamine-dextroamphetamine (ADDERALL) 20 MG tablet Take 20 mg by mouth 3 (three) times daily.    [provider]  azelastine (ASTELIN) 0.1 % nasal spray Place 2 sprays into both nostrils 2 (two) times daily. Use in each nostril as directed 12/06/17   Harrison Mons, PA  azelastine (ASTELIN) 0.1 % nasal  spray Place 2 sprays into both nostrils 2 (two) times daily. Use in each nostril as directed 05/11/18   Rutherford Guys, MD  dexlansoprazole (DEXILANT) 60 MG capsule Take 1 capsule (60 mg total) by mouth daily. 12/06/17   Harrison Mons, PA  divalproex (DEPAKOTE ER) 500 MG 24 hr tablet Take 1,500 mg by mouth at bedtime.     [provider]  gabapentin (NEURONTIN) 400 MG capsule Take 2 capsules (800 mg total) by mouth 3 (three) times daily. 05/08/18   Rutherford Guys, MD  HYDROcodone-acetaminophen (NORCO/VICODIN) 5-325 MG tablet Take 1 tablet by mouth every 4 (four) hours as needed. 07/20/18   Evalee Jefferson, PA-C  ibuprofen (ADVIL,MOTRIN) 800 MG tablet TAKE 1 TABLET BY MOUTH EVERY 8 HOURS AS NEEDED 02/26/18   Gale Journey, Damaris Hippo, PA-C  levonorgestrel (MIRENA) 20 MCG/24HR IUD 1 each by Intrauterine route once.    [provider]  levothyroxine (SYNTHROID, LEVOTHROID) 50 MCG tablet TAKE 1 TABLET BY MOUTH EVERY DAY 11/04/16   Harrison Mons, PA  lidocaine (LIDODERM) 5 % Place 3 patches onto the skin daily. Remove & Discard patch within 12 hours or as directed by provider 12/06/17   Harrison Mons, PA  naproxen (NAPROSYN) 500 MG tablet Take 1 tablet (500 mg total) by mouth 2 (two) times daily. 07/20/18   Evalee Jefferson, PA-C  ondansetron (ZOFRAN) 8 MG tablet Take 8 mg by mouth daily as needed for nausea.  11/11/16   [provider]  promethazine (PHENERGAN) 25 MG tablet Take 1 tablet (25 mg total) by mouth every 8 (eight) hours as needed for nausea or vomiting. 05/08/18   Rutherford Guys, MD  tamsulosin (FLOMAX) 0.4 MG CAPS capsule Take 1 capsule (0.4 mg total) by mouth daily. 05/11/18   Rutherford Guys, MD  tiZANidine (ZANAFLEX) 4 MG tablet TAKE 3 TABLETS BY MOUTH 3 TIMES A DAY 02/26/18   Weber, Damaris Hippo, PA-C  traZODone (DESYREL) 150 MG tablet Take 150 mg by mouth at bedtime as needed. 04/12/17   [provider]  zolpidem (AMBIEN) 10 MG tablet Take 5-10 mg by mouth at bedtime as needed  for sleep.    [provider]    Family History Family History  Problem Relation Age of Onset  . Migraines Mother   . Mental illness Mother   . Heart disease Father   . Alcohol abuse Father   . Arthritis Maternal Grandmother   . Thyroid disease Maternal Grandmother   . Heart disease Maternal Grandfather        AMI 1996, 2014  . Anemia Maternal Grandfather        bone marrow dysfunction    Social History Social History   Tobacco Use  . Smoking status: Current Every Day Smoker    Packs/day: 1.00  Types: E-cigarettes  . Smokeless tobacco: Never Used  Substance Use Topics  . Alcohol use: Yes    Alcohol/week: 3.0 standard drinks    Types: 3 Cans of beer per week    Comment: BAC was .97 on admission  . Drug use: Yes    Types: Marijuana    Comment: Neg UDS     Allergies   Penicillins; Progestins; Progestins; Sinequan [doxepin]; and Sinequan [doxepin]   Review of Systems Review of Systems  Constitutional: Negative for fever.  Musculoskeletal: Positive for arthralgias and joint swelling. Negative for myalgias.  Neurological: Negative for weakness and numbness.     Physical Exam Updated Vital Signs BP (!) 102/59 (BP Location: Left Arm)   Pulse 80   Temp 98 F (36.7 C) (Temporal)   Resp 16   Ht 5\' 6"  (1.676 m)   Wt 77.1 kg   LMP 07/13/2018   SpO2 96%   BMI 27.44 kg/m   Physical Exam  Constitutional: She appears well-developed and well-nourished.  HENT:  Head: Atraumatic.  Neck: Normal range of motion.  Cardiovascular:  Pulses equal bilaterally  Musculoskeletal: She exhibits edema and tenderness.       Hands: Edema and bruising of right 5th finger, excluding hand. No deformity.  Distal sensation intact.  Skin intact.  Neurological: She is alert. She has normal strength. She displays normal reflexes. No sensory deficit.  Skin: Skin is warm and dry.  Psychiatric: She has a normal mood and affect.     ED Treatments / Results  Labs (all labs  ordered are listed, but only abnormal results are displayed) Labs Reviewed - No data to display  EKG None  Radiology Dg Finger Little Right  Result Date: 07/20/2018 CLINICAL DATA:  Smashed in car door EXAM: RIGHT LITTLE FINGER 2+V COMPARISON:  None. FINDINGS: Acute nondisplaced fracture involving the mid shaft and base of the fifth middle phalanx with extension to the articular surface of the IP joint. No subluxation. IMPRESSION: Acute intra-articular fracture involving mid shaft and base of the fifth middle phalanx. Electronically Signed   By: Donavan Foil M.D.   On: 07/20/2018 17:21    Procedures Procedures (including critical care time)  Medications Ordered in ED Medications  naproxen (NAPROSYN) tablet 500 mg (500 mg Oral Given 07/20/18 1749)  HYDROcodone-acetaminophen (NORCO/VICODIN) 5-325 MG per tablet 1 tablet (1 tablet Oral Given 07/20/18 1814)     Initial Impression / Assessment and Plan / ED Course  I have reviewed the triage vital signs and the nursing notes.  Pertinent labs & imaging results that were available during my care of the patient were reviewed by me and considered in my medical decision making (see chart for details).     xrays reviewed and discussed with pt.  She was placed in a finger splint.  Naproxen prescribed.  Pt reported was taking bc powders at home with no improvement in pain and asked for stronger pain medicine.  Given fracture injury, felt appropriate to give small quantity of hydrocodone.  [place] database was reviewed along with past medical hx.  Caution advised re sedation.  Pt understands need for f/u with ortho, requested local ortho as has transportation problems. Referred to Dr Aline Brochure and/or Dr. Luna Glasgow whom she has seen in the past.  Final Clinical Impressions(s) / ED Diagnoses   Final diagnoses:  Closed nondisplaced fracture of middle phalanx of right little finger, initial encounter    ED Discharge Orders         Ordered  naproxen  (NAPROSYN) 500 MG tablet  2 times daily     07/20/18 1743    HYDROcodone-acetaminophen (NORCO/VICODIN) 5-325 MG tablet  Every 4 hours PRN     07/20/18 1812           Evalee Jefferson, PA-C 07/21/18 0004    Daleen Bo, MD 07/21/18 2350

## 2018-07-20 NOTE — Discharge Instructions (Addendum)
Wear your splint at all times to protect your finger.  Call the orthopedist for followup care/recheck within 1 week. Ice and elevation will help with pain and swelling.  You may take the hydrocodone prescribed for pain relief.  This will make you drowsy - do not drive within 4 hours of taking this medication and use caution while taking this medicine with your other medicines as they will intensify your drowsiness if taken too closely together.

## 2018-07-21 ENCOUNTER — Encounter: Payer: Self-pay | Admitting: Family Medicine

## 2018-07-21 DIAGNOSIS — G894 Chronic pain syndrome: Secondary | ICD-10-CM

## 2018-07-22 ENCOUNTER — Encounter: Payer: Self-pay | Admitting: Orthopaedic Surgery

## 2018-07-22 ENCOUNTER — Ambulatory Visit (INDEPENDENT_AMBULATORY_CARE_PROVIDER_SITE_OTHER): Payer: BLUE CROSS/BLUE SHIELD | Admitting: Orthopaedic Surgery

## 2018-07-22 VITALS — BP 109/70 | HR 68 | Ht 67.0 in | Wt 155.0 lb

## 2018-07-22 DIAGNOSIS — S62656A Nondisplaced fracture of medial phalanx of right little finger, initial encounter for closed fracture: Secondary | ICD-10-CM | POA: Diagnosis not present

## 2018-07-22 NOTE — Progress Notes (Signed)
Subjective:    Patient ID: Barbara Rogers, female    DOB: 1981-07-05, 37 y.o.   MRN: [id number]  HPI She caught her finger of the right little in a car door three days ago.  She was seen in the ER and x-rays were done.  They showed: IMPRESSION: Acute intra-articular fracture involving mid shaft and base of the fifth middle phalanx.  She was placed in a splint and asked to come here.  She has no other injury.  Pain is controlled.   Review of Systems  Constitutional: Positive for activity change.  Respiratory: Positive for shortness of breath.   Musculoskeletal: Positive for arthralgias and back pain.  Allergic/Immunologic: Positive for environmental allergies.  Psychiatric/Behavioral: The patient is nervous/anxious.   All other systems reviewed and are negative.  For Review of Systems, all other systems reviewed and are negative.  The following is a summary of the past history medically, past history surgically, known current medicines, social history and family history.  This information is gathered electronically by the computer from prior information and documentation.  I review this each visit and have found including this information at this point in the chart is beneficial and informative.   Past Medical History:  Diagnosis Date  . AKI (acute kidney injury) ([place]) 01/18/2015  . Allergy   . Anxiety   . Asthma    exacerbated by bronchitisi  . Asthma   . C3 cervical fracture ([place])   . C3 cervical fracture ([place]) 01/29/2016  . Cervical transverse process fracture ([place])   . Chronic left shoulder pain 08/2012  . Clavicle fracture   . Concussion 01/29/2016  . Depression   . 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 Dysmenorrhea   . Dysphagia   . Finger fracture, left 10/31/2012   LEFT 4th finger  . Insomnia   . Lumbar transverse process fracture ([place])   . MDD (major depressive disorder), recurrent episode, severe ([place]) 06/30/2016  . MDD (major depressive disorder),  recurrent, severe, with psychosis ([place]) 01/19/2015  . Migraine without aura   . Mood disorder ([place])   . Nephrolithiasis   . OD (overdose of drug), intentional self-harm, initial encounter ([place]) 06/26/2016  . PMDD (premenstrual dysphoric disorder)   . Respiratory failure ([place])   . Seizure-like activity ([place]) 06/30/2016   normal EEG, question pseudoseizures  . T12 burst fracture ([place])   . Traumatic hemo-pneumothorax   . Traumatic hemopneumothorax 02/09/2016    Past Surgical History:  Procedure Laterality Date  . FRACTURE SURGERY    . POSTERIOR LUMBAR FUSION 4 LEVEL N/A 01/30/2016   Procedure: T9-L2 Posterior Stabilization, Posterior Lumbar Fusion with Pedicle Screws;  Surgeon: Kary Kos, MD;  Location: [facility];  Service: Neurosurgery;  Laterality: N/A;    Current Outpatient Medications on File Prior to Visit  Medication Sig Dispense Refill  . albuterol (PROAIR HFA) 108 (90 Base) MCG/ACT inhaler TAKE 2 PUFFS EVERY 4 HOURS AS NEEDED 25.5 Inhaler 1  . ALPRAZolam (XANAX) 1 MG tablet Take 1 mg by mouth 4 (four) times daily.    Marland Kitchen amphetamine-dextroamphetamine (ADDERALL) 20 MG tablet Take 20 mg by mouth 3 (three) times daily.    Marland Kitchen azelastine (ASTELIN) 0.1 % nasal spray Place 2 sprays into both nostrils 2 (two) times daily. Use in each nostril as directed 90 mL 3  . azelastine (ASTELIN) 0.1 % nasal spray Place 2 sprays into both nostrils 2 (two) times daily. Use in each nostril as directed 30 mL 0  .  dexlansoprazole (DEXILANT) 60 MG capsule Take 1 capsule (60 mg total) by mouth daily. 90 capsule 3  . divalproex (DEPAKOTE ER) 500 MG 24 hr tablet Take 1,500 mg by mouth at bedtime.     . gabapentin (NEURONTIN) 400 MG capsule Take 2 capsules (800 mg total) by mouth 3 (three) times daily. 180 capsule 0  . HYDROcodone-acetaminophen (NORCO/VICODIN) 5-325 MG tablet Take 1 tablet by mouth every 4 (four) hours as needed. 15 tablet 0  . ibuprofen (ADVIL,MOTRIN) 800 MG tablet TAKE 1 TABLET BY MOUTH EVERY  8 HOURS AS NEEDED 270 tablet 0  . levonorgestrel (MIRENA) 20 MCG/24HR IUD 1 each by Intrauterine route once.    Marland Kitchen levothyroxine (SYNTHROID, LEVOTHROID) 50 MCG tablet TAKE 1 TABLET BY MOUTH EVERY DAY 90 tablet 2  . lidocaine (LIDODERM) 5 % Place 3 patches onto the skin daily. Remove & Discard patch within 12 hours or as directed by provider 270 patch 3  . naproxen (NAPROSYN) 500 MG tablet Take 1 tablet (500 mg total) by mouth 2 (two) times daily. 20 tablet 0  . ondansetron (ZOFRAN) 8 MG tablet Take 8 mg by mouth daily as needed for nausea.     . promethazine (PHENERGAN) 25 MG tablet Take 1 tablet (25 mg total) by mouth every 8 (eight) hours as needed for nausea or vomiting. 20 tablet 0  . tamsulosin (FLOMAX) 0.4 MG CAPS capsule Take 1 capsule (0.4 mg total) by mouth daily. 10 capsule 0  . tiZANidine (ZANAFLEX) 4 MG tablet TAKE 3 TABLETS BY MOUTH 3 TIMES A DAY 810 tablet 0  . traZODone (DESYREL) 150 MG tablet Take 150 mg by mouth at bedtime as needed.    . zolpidem (AMBIEN) 10 MG tablet Take 5-10 mg by mouth at bedtime as needed for sleep.     No current facility-administered medications on file prior to visit.     Social History   Socioeconomic History  . Marital status: Single    Spouse name: N/A  . Number of children: 0  . Years of education: 21  . Highest education level: Not on file  Occupational History  . Occupation: [employer]    Employer: [employer]    Comment: [employer]  . Occupation: Currently on disability  Social Needs  . Financial resource strain: Not on file  . Food insecurity:    Worry: Not on file    Inability: Not on file  . Transportation needs:    Medical: Not on file    Non-medical: Not on file  Tobacco Use  . Smoking status: Current Every Day Smoker    Packs/day: 1.00    Types: E-cigarettes  . Smokeless tobacco: Never Used  Substance and Sexual Activity  . Alcohol use: Yes    Alcohol/week: 3.0 standard drinks    Types: 3 Cans of beer per week    Comment: BAC was .97 on  admission  . Drug use: Yes    Types: Marijuana    Comment: Neg UDS  . Sexual activity: Yes    Partners: Male    Birth control/protection: IUD  Lifestyle  . Physical activity:    Days per week: Not on file    Minutes per session: Not on file  . Stress: Not on file  Relationships  . Social connections:    Talks on phone: Not on file    Gets together: Not on file    Attends religious service: Not on file    Active member of club or organization: Not on  file    Attends meetings of clubs or organizations: Not on file    Relationship status: Not on file  . Intimate partner violence:    Fear of current or ex partner: Not on file    Emotionally abused: Not on file    Physically abused: Not on file    Forced sexual activity: Not on file  Other Topics Concern  . Not on file  Social History Narrative   ** Merged History Encounter **       Lived with her boyfriend/fiance for several years, Gaspar Bidding. They split up in spring/summer 2018.   Lives alone.   Both parents deceased during her childhood.   She found her mother's body following her suicide, when she was 37 years old.   Raised by her grandparents, also now deceased.    Family History  Problem Relation Age of Onset  . Migraines Mother   . Mental illness Mother   . Heart disease Father   . Alcohol abuse Father   . Arthritis Maternal Grandmother   . Thyroid disease Maternal Grandmother   . Heart disease Maternal Grandfather        AMI 1996, 2014  . Anemia Maternal Grandfather        bone marrow dysfunction    BP 109/70   Pulse 68   Ht 5\' 7"  (1.702 m)   Wt 155 lb (70.3 kg)   LMP 07/13/2018   BMI 24.28 kg/m   Body mass index is 24.28 kg/m.      Objective:   Physical Exam  Constitutional: She is oriented to person, place, and time. She appears well-developed and well-nourished.  HENT:  Head: Normocephalic and atraumatic.  Eyes: Pupils are equal, round, and reactive to light. Conjunctivae and EOM are normal.    Neck: Normal range of motion. Neck supple.  Cardiovascular: Normal rate, regular rhythm and intact distal pulses.  Pulmonary/Chest: Effort normal.  Abdominal: Soft.  Musculoskeletal:       Hands: Neurological: She is alert and oriented to person, place, and time. She has normal reflexes. She displays normal reflexes. No cranial nerve deficit. She exhibits normal muscle tone. Coordination normal.  Skin: Skin is warm and dry.  Psychiatric: She has a normal mood and affect. Her behavior is normal. Judgment and thought content normal.          Assessment & Plan:   Encounter Diagnosis  Name Primary?  . Closed nondisplaced fracture of middle phalanx of right little finger, initial encounter Yes   A new aluminum splint is applied.  Return in one week.  X-rays in the splint of the little finger right.  Call if any problem.  Precautions discussed.   Out of work.  Electronically Signed Sanjuana Kava, MD 11/13/20193:48 PM

## 2018-07-25 MED ORDER — IBUPROFEN 800 MG PO TABS: 800.0000 mg | ORAL_TABLET | Freq: Three times a day (TID) | ORAL | 0 refills | Status: DC | PRN

## 2018-07-25 MED ORDER — TIZANIDINE HCL 4 MG PO TABS: ORAL_TABLET | ORAL | 0 refills | Status: DC

## 2018-08-13 ENCOUNTER — Ambulatory Visit (INDEPENDENT_AMBULATORY_CARE_PROVIDER_SITE_OTHER): Payer: BLUE CROSS/BLUE SHIELD | Admitting: Orthopaedic Surgery

## 2018-08-13 ENCOUNTER — Encounter: Payer: Self-pay | Admitting: Orthopaedic Surgery

## 2018-08-13 ENCOUNTER — Ambulatory Visit (INDEPENDENT_AMBULATORY_CARE_PROVIDER_SITE_OTHER): Payer: BLUE CROSS/BLUE SHIELD

## 2018-08-13 VITALS — BP 120/72 | HR 82 | Ht 67.0 in | Wt 170.0 lb

## 2018-08-13 DIAGNOSIS — S62656S Nondisplaced fracture of medial phalanx of right little finger, sequela: Secondary | ICD-10-CM

## 2018-08-13 NOTE — Progress Notes (Signed)
CC:  My finger does not hurt  She has been using the aluminum splint on the right little finger.  NV intact. ROM is good.  X-rays were done and reported separately  Encounter Diagnosis  Name Primary?  . Closed nondisplaced fracture of middle phalanx of right little finger, sequela Yes   A new aluminum splint applied and fitted.  Return in two weeks.  X-rays of the little finger on return.  Stay out of work.  Call if any problem.  Precautions discussed.   Electronically Signed Sanjuana Kava, MD 12/5/20199:25 AM

## 2018-08-13 NOTE — Patient Instructions (Signed)
OUT OF WORK ?

## 2018-08-25 ENCOUNTER — Telehealth: Payer: Self-pay | Admitting: Orthopaedic Surgery

## 2018-08-25 NOTE — Telephone Encounter (Signed)
On 08/13/18, I faxed, per patient's signed authorization, faxed to both [facility] # [phone] and [employer] # [phone] as requested.   On 08/25/18, pt called back to relay that [facility] did not receive the fax.  I re-faxed to both as requested.  Received confirmation that faxes have gone through.

## 2018-08-27 ENCOUNTER — Ambulatory Visit: Payer: BLUE CROSS/BLUE SHIELD | Admitting: Orthopaedic Surgery

## 2018-09-08 ENCOUNTER — Encounter: Payer: Self-pay | Admitting: Orthopaedic Surgery

## 2018-09-08 ENCOUNTER — Ambulatory Visit (INDEPENDENT_AMBULATORY_CARE_PROVIDER_SITE_OTHER): Payer: BLUE CROSS/BLUE SHIELD

## 2018-09-08 ENCOUNTER — Ambulatory Visit (INDEPENDENT_AMBULATORY_CARE_PROVIDER_SITE_OTHER): Payer: BLUE CROSS/BLUE SHIELD | Admitting: Orthopaedic Surgery

## 2018-09-08 DIAGNOSIS — S62656S Nondisplaced fracture of medial phalanx of right little finger, sequela: Secondary | ICD-10-CM

## 2018-09-08 NOTE — Progress Notes (Signed)
CC:  My finger is better  Her right little finger is less painful. She has begun some ROM at home already. She has been using her splint.  X-rays were done of the little finger on the right, reported separately.  NV intact.  Appearance is normal.  Encounter Diagnosis  Name Primary?  . Closed nondisplaced fracture of middle phalanx of right little finger, sequela Yes   Begin ROM exercises.  Buddy tape finger.  Return in two weeks.  Stay out of work.  Call if any problem.  Precautions discussed.   Electronically Signed Sanjuana Kava, MD 12/31/20198:58 AM

## 2018-09-08 NOTE — Patient Instructions (Signed)

## 2018-09-17 ENCOUNTER — Telehealth: Payer: Self-pay | Admitting: Orthopaedic Surgery

## 2018-09-17 NOTE — Telephone Encounter (Addendum)
Patient is asking to be release to return to work on 09/23/18. Patient states she has lack of funds to ride [employer] and pay copays for office visits. 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  Please advise

## 2018-09-22 ENCOUNTER — Encounter: Payer: Self-pay | Admitting: Orthopaedic Surgery

## 2018-09-22 ENCOUNTER — Telehealth: Payer: Self-pay | Admitting: Family Medicine

## 2018-09-22 ENCOUNTER — Ambulatory Visit: Payer: BLUE CROSS/BLUE SHIELD | Admitting: Orthopaedic Surgery

## 2018-09-22 ENCOUNTER — Telehealth: Payer: Self-pay | Admitting: Orthopaedic Surgery

## 2018-09-22 DIAGNOSIS — S129XXS Fracture of neck, unspecified, sequela: Secondary | ICD-10-CM

## 2018-09-22 DIAGNOSIS — G894 Chronic pain syndrome: Secondary | ICD-10-CM

## 2018-09-22 DIAGNOSIS — M25512 Pain in left shoulder: Principal | ICD-10-CM

## 2018-09-22 DIAGNOSIS — G8929 Other chronic pain: Secondary | ICD-10-CM

## 2018-09-22 NOTE — Telephone Encounter (Signed)
Please adv    Copied from [place] [phone]. Topic: Referral - Request for Referral >> Sep 22, 2018  9:21 AM Rayann Heman wrote: Reason for CRM: pt called an stated that she would like a referral to a pain management center. Pt states that she had seen chelle [facility] for this issue. Pt states that she is unable to come in for an appointment.

## 2018-09-22 NOTE — Telephone Encounter (Signed)
Patient called to relay that "a can fell onto her hand when putting into her cabinet, and now hand is hurting."  Asking if Dr Luna Glasgow "may prescribe a little bit of pain medication."   I offered appointment for today, as relayed that it is in her best interest to be seen today. In addition, patient's original appointment for follow up Xray was scheduled for today, and patient changed it due to transportation. Patient is looking into transportation for tomorrow.

## 2018-09-22 NOTE — Telephone Encounter (Signed)
Patient relays she was unable to return to work as planned on 09/21/18; therefore, will get updated work note at appointment.  She was scheduled for appointment 09/22/18, then re-scheduled due to transportation. We have faxed a note to [facility] regarding transportation for re-scheduled appointment; patient aware.

## 2018-09-22 NOTE — Telephone Encounter (Signed)
Called back to patient; left message.

## 2018-09-22 NOTE — Telephone Encounter (Signed)
Referral made 

## 2018-09-22 NOTE — Telephone Encounter (Signed)
I can see her tomorrow then if she cannot come today.

## 2018-09-22 NOTE — Telephone Encounter (Signed)
Copied from [place] [phone]. Topic: Quick Communication - Rx Refill/Question >> Sep 22, 2018  9:26 AM Rayann Heman wrote: Medication:albuterol [facility] HFA) 108 [phone] Base) MCG/ACT inhaler [[id number]]   Has the patient contacted their pharmacy? No/ needs new rx   Preferred Pharmacy (with phone number or street name): [id number] [place], [place] [phone] (Phone) [phone] (Fax)  Agent: Please be advised that RX refills may take up to 3 business days. We ask that you follow-up with your pharmacy.

## 2018-09-22 NOTE — Telephone Encounter (Signed)
OK 

## 2018-09-22 NOTE — Telephone Encounter (Signed)
Pt would like 3 month supply

## 2018-09-22 NOTE — Telephone Encounter (Signed)
Please advise 

## 2018-09-23 ENCOUNTER — Ambulatory Visit: Payer: BLUE CROSS/BLUE SHIELD | Admitting: Orthopaedic Surgery

## 2018-09-24 ENCOUNTER — Ambulatory Visit: Payer: BLUE CROSS/BLUE SHIELD | Admitting: Orthopaedic Surgery

## 2018-09-24 ENCOUNTER — Other Ambulatory Visit: Payer: Self-pay

## 2018-09-24 DIAGNOSIS — J309 Allergic rhinitis, unspecified: Secondary | ICD-10-CM

## 2018-09-24 MED ORDER — ALBUTEROL SULFATE HFA 108 (90 BASE) MCG/ACT IN AERS: INHALATION_SPRAY | RESPIRATORY_TRACT | 1 refills | Status: AC

## 2018-09-24 NOTE — Telephone Encounter (Signed)
Informed pt that referral

## 2018-09-24 NOTE — Telephone Encounter (Signed)
Rx sent to pharmacy   

## 2018-09-29 ENCOUNTER — Ambulatory Visit: Payer: BLUE CROSS/BLUE SHIELD | Admitting: Orthopaedic Surgery

## 2018-09-30 ENCOUNTER — Encounter: Payer: Self-pay | Admitting: Orthopaedic Surgery

## 2018-09-30 ENCOUNTER — Ambulatory Visit (INDEPENDENT_AMBULATORY_CARE_PROVIDER_SITE_OTHER): Payer: BLUE CROSS/BLUE SHIELD | Admitting: Orthopaedic Surgery

## 2018-09-30 ENCOUNTER — Ambulatory Visit (INDEPENDENT_AMBULATORY_CARE_PROVIDER_SITE_OTHER): Payer: BLUE CROSS/BLUE SHIELD

## 2018-09-30 DIAGNOSIS — S62656S Nondisplaced fracture of medial phalanx of right little finger, sequela: Secondary | ICD-10-CM

## 2018-09-30 NOTE — Patient Instructions (Signed)
Return to work October 12, 2018

## 2018-09-30 NOTE — Progress Notes (Signed)
CC:  My little finger is better  She has much better motion of the little finger.  She has no swelling and minimal tenderness.  NV intact.  ROM is nearly full.  X-rays were done of the little finger right, reported separately.  Encounter Diagnosis  Name Primary?  . Closed nondisplaced fracture of middle phalanx of right little finger, sequela Yes   She can return to work 10-12-2018  I will see her in three weeks.  X-rays finger on return.  Call if any problem.  Precautions discussed.   Electronically Signed Sanjuana Kava, MD 1/22/202010:42 AM

## 2018-10-01 ENCOUNTER — Telehealth: Payer: Self-pay | Admitting: Orthopaedic Surgery

## 2018-10-01 NOTE — Telephone Encounter (Signed)
On 09/30/2018 - Faxed work status note to both [employer], per request on file fax#'s [phone] and [phone]; patient aware.

## 2018-10-21 ENCOUNTER — Ambulatory Visit: Payer: BLUE CROSS/BLUE SHIELD | Admitting: Orthopaedic Surgery

## 2018-10-23 ENCOUNTER — Other Ambulatory Visit: Payer: Self-pay | Admitting: Family Medicine

## 2018-10-23 DIAGNOSIS — G894 Chronic pain syndrome: Secondary | ICD-10-CM

## 2018-10-23 NOTE — Telephone Encounter (Signed)
Requested medication (s) are due for refill today: Yes  Requested medication (s) are on the active medication list: Yes  Last refill:  07/25/18  Future visit scheduled: Yes  Notes to clinic:  See request    Requested Prescriptions  Pending Prescriptions Disp Refills   tiZANidine (ZANAFLEX) 4 MG tablet [Pharmacy Med Name: TIZANIDINE HCL 4 MG TABLET] 810 tablet 0    Sig: TAKE 3 TABLETS BY MOUTH 3 TIMES A DAY     Not Delegated - Cardiovascular:  Alpha-2 Agonists - tizanidine Failed - 10/23/2018  1:46 AM      Failed - This refill cannot be delegated      Passed - Valid encounter within last 6 months    Recent Outpatient Visits          5 months ago Nephrolithiasis   Primary Care at [facility], Fenton Malling, MD   5 months ago Nausea and vomiting, intractability of vomiting not specified, unspecified vomiting type   [facility] at [facility], Lilia Argue, MD   10 months ago Dysuria   Primary Care at [facility], [place], [place]   1 year ago Acute pain of right shoulder   Primary Care at [facility], [place], [place]   1 year ago Chronic pain syndrome   Primary Care at [facility], [place], [place]      Future Appointments            In 6 days Rutherford Guys, MD Primary Care at [place], [facility]         Signed Prescriptions Disp Refills   ibuprofen (ADVIL,MOTRIN) 800 MG tablet 270 tablet 0    Sig: TAKE 1 TABLET BY MOUTH EVERY 8 HOURS AS NEEDED     Analgesics:  NSAIDS Failed - 10/23/2018  1:46 AM      Failed - Cr in normal range and within 360 days    Creat  Date Value Ref Range Status  03/26/2016 0.74 0.50 - 1.10 mg/dL Final   Creatinine, Ser  Date Value Ref Range Status  07/05/2018 1.19 (H) 0.44 - 1.00 mg/dL Final         Failed - HGB in normal range and within 360 days    Hemoglobin  Date Value Ref Range Status  07/05/2018 11.8 (L) 12.0 - 15.0 g/dL Final  01/13/2017 14.9 11.1 - 15.9 g/dL Final         Passed - Patient is not pregnant      Passed - Valid encounter  within last 12 months    Recent Outpatient Visits          5 months ago Nephrolithiasis   Primary Care at [facility], Fenton Malling, MD   5 months ago Nausea and vomiting, intractability of vomiting not specified, unspecified vomiting type   Primary Care at [facility], Lilia Argue, MD   10 months ago Dysuria   Primary Care at [facility], [place], [place]   1 year ago Acute pain of right shoulder   Primary Care at [facility], [place], [place]   1 year ago Chronic pain syndrome   Primary Care at [facility], [place], [place]      Future Appointments            In 6 days Rutherford Guys, MD Primary Care at [place], [facility]

## 2018-10-27 NOTE — Telephone Encounter (Signed)
Spoke with provider. Will address at upcoming visit

## 2018-10-29 ENCOUNTER — Ambulatory Visit: Payer: BLUE CROSS/BLUE SHIELD | Admitting: Family Medicine

## 2018-11-18 ENCOUNTER — Other Ambulatory Visit: Payer: Self-pay | Admitting: Family Medicine

## 2018-11-18 DIAGNOSIS — G894 Chronic pain syndrome: Secondary | ICD-10-CM

## 2018-11-18 NOTE — Telephone Encounter (Signed)
Requested medication (s) are due for refill today - should have 1 week left  Requested medication (s) are on the active medication list -yes  Future visit scheduled -no  Last refill: 10/27/18  Notes to clinic: Patient is requesting a non delegated Rx- sent to PCP review.  Requested Prescriptions  Pending Prescriptions Disp Refills   tiZANidine (ZANAFLEX) 4 MG tablet [Pharmacy Med Name: TIZANIDINE HCL 4 MG TABLET] 270 tablet 0    Sig: TAKE 3 TABLETS BY MOUTH 3 TIMES A DAY     Not Delegated - Cardiovascular:  Alpha-2 Agonists - tizanidine Failed - 11/18/2018  9:32 AM      Failed - This refill cannot be delegated      Failed - Valid encounter within last 6 months    Recent Outpatient Visits          6 months ago Nephrolithiasis   Primary Care at [facility], Fenton Malling, MD   6 months ago Nausea and vomiting, intractability of vomiting not specified, unspecified vomiting type   Primary Care at [facility], Lilia Argue, MD   11 months ago Dysuria   Primary Care at [facility], [place], [place]   1 year ago Acute pain of right shoulder   Primary Care at [facility], [place], [place]   1 year ago Chronic pain syndrome   Primary Care at [facility], [place], [place]              Requested Prescriptions  Pending Prescriptions Disp Refills   tiZANidine (ZANAFLEX) 4 MG tablet [Pharmacy Med Name: TIZANIDINE HCL 4 MG TABLET] 270 tablet 0    Sig: TAKE 3 TABLETS BY MOUTH 3 TIMES A DAY     Not Delegated - Cardiovascular:  Alpha-2 Agonists - tizanidine Failed - 11/18/2018  9:32 AM      Failed - This refill cannot be delegated      Failed - Valid encounter within last 6 months    Recent Outpatient Visits          6 months ago Nephrolithiasis   Primary Care at [facility], Fenton Malling, MD   6 months ago Nausea and vomiting, intractability of vomiting not specified, unspecified vomiting type   Primary Care at [facility], Lilia Argue, MD   11 months ago Dysuria   Primary Care at [facility],  [place], [place]   1 year ago Acute pain of right shoulder   Primary Care at [facility], [place], [place]   1 year ago Chronic pain syndrome   Primary Care at [facility], [place], [place]

## 2018-12-25 ENCOUNTER — Other Ambulatory Visit: Payer: Self-pay

## 2018-12-25 ENCOUNTER — Telehealth: Payer: Self-pay | Admitting: Family Medicine

## 2018-12-25 DIAGNOSIS — J309 Allergic rhinitis, unspecified: Secondary | ICD-10-CM

## 2018-12-25 MED ORDER — AZELASTINE HCL 0.1 % NA SOLN: 2.0000 | Freq: Two times a day (BID) | NASAL | 0 refills | Status: AC

## 2018-12-25 NOTE — Telephone Encounter (Signed)
Copied from [place] [phone]. Topic: Quick Communication - Rx Refill/Question >> Dec 25, 2018  2:19 PM Sheran Luz wrote: Medication: azelastine (ASTELIN) 0.1 % nasal spray   Patient is requesting 3 mo supply of this medication.   Preferred Pharmacy (with phone number or street name):[id number] [place], [place]  [phone] (Phone) [phone] (Fax)

## 2018-12-25 NOTE — Telephone Encounter (Signed)
RX has been sent.

## 2019-01-06 ENCOUNTER — Other Ambulatory Visit (HOSPITAL_COMMUNITY): Payer: Self-pay | Admitting: Neurosurgery

## 2019-01-06 DIAGNOSIS — S22081A Stable burst fracture of T11-T12 vertebra, initial encounter for closed fracture: Secondary | ICD-10-CM

## 2019-01-16 ENCOUNTER — Other Ambulatory Visit: Payer: Self-pay | Admitting: Family Medicine

## 2019-02-12 ENCOUNTER — Ambulatory Visit (HOSPITAL_COMMUNITY): Admission: RE | Admit: 2019-02-12 | Payer: BLUE CROSS/BLUE SHIELD | Source: Ambulatory Visit

## 2019-02-12 ENCOUNTER — Ambulatory Visit (HOSPITAL_COMMUNITY): Payer: BLUE CROSS/BLUE SHIELD

## 2019-03-02 ENCOUNTER — Other Ambulatory Visit: Payer: Self-pay

## 2019-03-02 ENCOUNTER — Other Ambulatory Visit (HOSPITAL_COMMUNITY): Payer: BLUE CROSS/BLUE SHIELD

## 2019-03-02 ENCOUNTER — Ambulatory Visit (HOSPITAL_COMMUNITY)
Admission: RE | Admit: 2019-03-02 | Discharge: 2019-03-02 | Disposition: A | Discharge status: Home / Self Care | Payer: BC Managed Care – PPO | Source: Ambulatory Visit | Attending: Neurosurgery | Admitting: Neurosurgery

## 2019-03-02 DIAGNOSIS — S22081A Stable burst fracture of T11-T12 vertebra, initial encounter for closed fracture: Secondary | ICD-10-CM | POA: Diagnosis present

## 2019-03-30 ENCOUNTER — Other Ambulatory Visit: Payer: Self-pay | Admitting: Neurosurgery

## 2019-03-31 NOTE — Pre-Procedure Instructions (Signed)
Barbara Rogers  03/31/2019      [id number] [place], [place] - [address] AT [address] [place] [address] Phone: [phone] Fax: [phone]  [place] [address], [place] - [address] [place] [address] [place] [address] [place] [address] Phone: [phone] Fax: [phone]    Your procedure is scheduled on April 07, 2019.  Report to [facility] Entrance "A" at 1030 AM.  Call this number if you have problems the morning of surgery:  [phone]   Remember:  Do not eat or drink after midnight.    Take these medicines the morning of surgery with A SIP OF WATER  albuterol (PROAIR HFA) 108 (90 Base) MCG/ACT inhaler-if needed (bring with you) ALPRAZolam (XANAX)-if needed azelastine (ASTELIN) 0.1 % nasal spray beclomethasone (QVAR) 80 MCG/ACT inhaler buprenorphine-naloxone (SUBOXONE)  dexlansoprazole (DEXILANT) gabapentin (NEURONTIN)  levothyroxine (SYNTHROID) ondansetron (ZOFRAN) -if needed Phenergan-if needed tiZANidine (ZANAFLEX)  DO NOT take Adderall the day of your surgery   Beginning now, STOP taking any Aspirin (unless otherwise instructed by your surgeon), Aleve, Naproxen, Ibuprofen, Motrin, Advil, Goody's, BC's, all herbal medications, fish oil, and all vitamins    Day of surgery:  Do not wear jewelry, make-up or nail polish.  Do not wear lotions, powders, or perfumes, or deodorant.  Do not shave 48 hours prior to surgery.    Do not bring valuables to the hospital.  [facility] is not responsible for any belongings or valuables.  IF you are a smoker, DO NOT Smoke 24 hours prior to surgery   IF you wear a CPAP at night please bring your mask, tubing, and machine the morning of surgery    Remember that you must have someone to transport you home after your surgery, and remain with you for 24 hours if you are discharged the same day.  Contacts, dentures or bridgework may not be worn into surgery.  Leave your suitcase  in the car.  After surgery it may be brought to your room.  For patients admitted to the hospital, discharge time will be determined by your treatment team.  Patients discharged the day of surgery will not be allowed to drive home.    [place]- Preparing For Surgery  Before surgery, you can play an important role. Because skin is not sterile, your skin needs to be as free of germs as possible. You can reduce the number of germs on your skin by washing with CHG (chlorahexidine gluconate) Soap before surgery.  CHG is an antiseptic cleaner which kills germs and bonds with the skin to continue killing germs even after washing.    Oral Hygiene is also important to reduce your risk of infection.  Remember - BRUSH YOUR TEETH THE MORNING OF SURGERY WITH YOUR REGULAR TOOTHPASTE  Please do not use if you have an allergy to CHG or antibacterial soaps. If your skin becomes reddened/irritated stop using the CHG.  Do not shave (including legs and underarms) for at least 48 hours prior to first CHG shower. It is OK to shave your face.  Please follow these instructions carefully.   1. Shower the NIGHT BEFORE SURGERY and the MORNING OF SURGERY with CHG.   2. If you chose to wash your hair, wash your hair first as usual with your normal shampoo.  3. After you shampoo, rinse your hair and body thoroughly to remove the shampoo.  4. Use CHG as you would any other liquid soap. You can apply  CHG directly to the skin and wash gently with a scrungie or a clean washcloth.   5. Apply the CHG Soap to your body ONLY FROM THE NECK DOWN.  Do not use on open wounds or open sores. Avoid contact with your eyes, ears, mouth and genitals (private parts). Wash Face and genitals (private parts)  with your normal soap.  6. Wash thoroughly, paying special attention to the area where your surgery will be performed.  7. Thoroughly rinse your body with warm water from the neck down.  8. DO NOT shower/wash with your normal  soap after using and rinsing off the CHG Soap.  9. Pat yourself dry with a CLEAN TOWEL.  10. Wear CLEAN PAJAMAS to bed the night before surgery, wear comfortable clothes the morning of surgery  11. Place CLEAN SHEETS on your bed the night of your first shower and DO NOT SLEEP WITH PETS.  Day of Surgery: Shower as above Do not apply any deodorants/lotions.  Please wear clean clothes to the hospital/surgery center.   Remember to brush your teeth WITH YOUR REGULAR TOOTHPASTE.  Please read over the following fact sheets that you were given.

## 2019-04-01 ENCOUNTER — Encounter (HOSPITAL_COMMUNITY): Payer: Self-pay

## 2019-04-01 ENCOUNTER — Other Ambulatory Visit: Payer: Self-pay

## 2019-04-01 ENCOUNTER — Encounter (HOSPITAL_COMMUNITY)
Admission: RE | Admit: 2019-04-01 | Discharge: 2019-04-01 | Disposition: A | Discharge status: Home / Self Care | Payer: BC Managed Care – PPO | Source: Ambulatory Visit | Attending: Neurosurgery | Admitting: Neurosurgery

## 2019-04-01 DIAGNOSIS — Z01812 Encounter for preprocedural laboratory examination: Secondary | ICD-10-CM | POA: Insufficient documentation

## 2019-04-01 HISTORY — DX: Dyspnea, unspecified: R06.00

## 2019-04-01 HISTORY — DX: Pneumonia, unspecified organism: J18.9

## 2019-04-01 LAB — SURGICAL PCR SCREEN
MRSA, PCR: NEGATIVE
Staphylococcus aureus: NEGATIVE

## 2019-04-01 LAB — COMPREHENSIVE METABOLIC PANEL
ALT: 22 U/L (ref 0–44)
AST: 22 U/L (ref 15–41)
Albumin: 3.5 g/dL (ref 3.5–5.0)
Alkaline Phosphatase: 72 U/L (ref 38–126)
Anion gap: 7 (ref 5–15)
BUN: 10 mg/dL (ref 6–20)
CO2: 27 mmol/L (ref 22–32)
Calcium: 8.9 mg/dL (ref 8.9–10.3)
Chloride: 104 mmol/L (ref 98–111)
Creatinine, Ser: 0.78 mg/dL (ref 0.44–1.00)
GFR calc Af Amer: >60 mL/min (ref >60)
GFR calc non Af Amer: >60 mL/min (ref >60)
Glucose, Bld: 85 mg/dL (ref 70–99)
Potassium: 3.8 mmol/L (ref 3.5–5.1)
Sodium: 138 mmol/L (ref 135–145)
Total Bilirubin: 0.3 mg/dL (ref 0.3–1.2)
Total Protein: 6.5 g/dL (ref 6.5–8.1)

## 2019-04-01 LAB — CBC
HCT: 41.6 % (ref 36.0–46.0)
Hemoglobin: 13.2 g/dL (ref 12.0–15.0)
MCH: 28.9 pg (ref 26.0–34.0)
MCHC: 31.7 g/dL (ref 30.0–36.0)
MCV: 91 fL (ref 80.0–100.0)
Platelets: 185 10*3/uL (ref 150–400)
RBC: 4.57 MIL/uL (ref 3.87–5.11)
RDW: 13.4 % (ref 11.5–15.5)
WBC: 7.7 10*3/uL (ref 4.0–10.5)
nRBC: 0 % (ref 0.0–0.2)

## 2019-04-01 NOTE — Pre-Procedure Instructions (Deleted)
Barbara Rogers  04/01/2019     [id number] [place] [place] [address] [place] [address] Phone: [phone] Fax: [phone]  [place] [address], [place] - [address] [place] [address] [place] [address] [place] [address] Phone: [phone] Fax: [phone]   Your procedure is scheduled on Wednesday, April 07, 2019.  Report to [facility] Entrance "A" at 1030 AM.  Call this number if you have problems the morning of surgery:  [phone]   Remember:  Do not eat after midnight.  You may drink clear liquids until 9:30 the morning of your surgery.   Clear liquids allowed are: Water, Non-Citrus Juices (without pulp), Carbonated Beverages, Clear Tea, Black Coffee Only, and Gatorade    Take these medicines the morning of surgery with A SIP OF WATER  albuterol (PROAIR HFA) 108 (90 Base) MCG/ACT inhaler-if needed (bring with you) ALPRAZolam (XANAX)-if needed azelastine (ASTELIN) 0.1 % nasal spray beclomethasone (QVAR) 80 MCG/ACT inhaler dexlansoprazole (DEXILANT) gabapentin (NEURONTIN)  levothyroxine (SYNTHROID) ondansetron (ZOFRAN) -if needed Phenergan-if needed tiZANidine (ZANAFLEX)  DO NOT take Adderall the day of your surgery   Beginning now, STOP taking any Aspirin (unless otherwise instructed by your surgeon), Aleve, Naproxen, Ibuprofen, Motrin, Advil, Goody's, BC's, all herbal medications, fish oil, and all vitamins    Day of surgery:  Do not wear jewelry, make-up or nail polish.  Do not wear lotions, powders, or perfumes, or deodorant.  Do not shave 48 hours prior to surgery.    Do not bring valuables to the hospital.  [facility] is not responsible for any belongings or valuables.  IF you are a smoker, DO NOT Smoke 24 hours prior to surgery   IF you wear a CPAP at night please bring your mask, tubing, and machine the morning of surgery    Remember that you must have someone to transport you home after your  surgery, and remain with you for 24 hours if you are discharged the same day.  Contacts, dentures or bridgework may not be worn into surgery.  Leave your suitcase in the car.  After surgery it may be brought to your room.  For patients admitted to the hospital, discharge time will be determined by your treatment team.  Patients discharged the day of surgery will not be allowed to drive home.   [place]- Preparing For Surgery  Before surgery, you can play an important role. Because skin is not sterile, your skin needs to be as free of germs as possible. You can reduce the number of germs on your skin by washing with CHG (chlorahexidine gluconate) Soap before surgery.  CHG is an antiseptic cleaner which kills germs and bonds with the skin to continue killing germs even after washing.    Oral Hygiene is also important to reduce your risk of infection.  Remember - BRUSH YOUR TEETH THE MORNING OF SURGERY WITH YOUR REGULAR TOOTHPASTE  Please do not use if you have an allergy to CHG or antibacterial soaps. If your skin becomes reddened/irritated stop using the CHG.  Do not shave (including legs and underarms) for at least 48 hours prior to first CHG shower. It is OK to shave your face.  Please follow these instructions carefully.   1. Shower the NIGHT BEFORE SURGERY and the MORNING OF SURGERY with CHG.   2. If you chose to wash your hair, wash your hair first as usual with your normal shampoo.  3. After you shampoo,  rinse your hair and body thoroughly to remove the shampoo.  4. Use CHG as you would any other liquid soap. You can apply CHG directly to the skin and wash gently with a scrungie or a clean washcloth.   5. Apply the CHG Soap to your body ONLY FROM THE NECK DOWN.  Do not use on open wounds or open sores. Avoid contact with your eyes, ears, mouth and genitals (private parts). Wash Face and genitals (private parts)  with your normal soap.  6. Wash thoroughly, paying special attention  to the area where your surgery will be performed.  7. Thoroughly rinse your body with warm water from the neck down.  8. DO NOT shower/wash with your normal soap after using and rinsing off the CHG Soap.  9. Pat yourself dry with a CLEAN TOWEL.  10. Wear CLEAN PAJAMAS to bed the night before surgery, wear comfortable clothes the morning of surgery  11. Place CLEAN SHEETS on your bed the night of your first shower and DO NOT SLEEP WITH PETS.  Day of Surgery: Shower as above Do not apply any deodorants/lotions.  Please wear clean clothes to the hospital/surgery center.   Remember to brush your teeth WITH YOUR REGULAR TOOTHPASTE.  Please read over the following fact sheets that you were given.

## 2019-04-01 NOTE — Progress Notes (Signed)
PCP - Harrison Mons, PA Cardiologist - denies  Chest x-ray - denies EKG - denies Stress Test -denies  ECHO - denies Cardiac Cath - denies  Sleep Study - denies CPAP - N/A  Blood Thinner Instructions: N/A Aspirin Instructions: N/A  Anesthesia review: No  Coronavirus Screening  Have you experienced the following symptoms:  Cough yes/no: No Fever (>100.67F)  yes/no: No Runny nose yes/no: No Sore throat yes/no: No Difficulty breathing/shortness of breath  yes/no: No  Have you or a family member traveled in the last 14 days and where? yes/no: No  If the patient indicates "YES" to the above questions, their PAT will be rescheduled to limit the exposure to others and, the surgeon will be notified. THE PATIENT WILL NEED TO BE ASYMPTOMATIC FOR 14 DAYS.   If the patient is not experiencing any of these symptoms, the PAT nurse will instruct them to NOT bring anyone with them to their appointment since they may have these symptoms or traveled as well.   Please remind your patients and families that hospital visitation restrictions are in effect and the importance of the restrictions.   Patient denies shortness of breath, fever, cough and chest pain at PAT appointment  Patient verbalized understanding of instructions that were given to them at the PAT appointment. Patient was also instructed that they will need to review over the PAT instructions again at home before surgery.

## 2019-04-05 ENCOUNTER — Other Ambulatory Visit (HOSPITAL_COMMUNITY)
Admission: RE | Admit: 2019-04-05 | Discharge: 2019-04-05 | Disposition: A | Discharge status: Home / Self Care | Payer: BC Managed Care – PPO | Source: Ambulatory Visit | Attending: Neurosurgery | Admitting: Neurosurgery

## 2019-04-05 ENCOUNTER — Other Ambulatory Visit: Payer: Self-pay

## 2019-04-05 DIAGNOSIS — Z20828 Contact with and (suspected) exposure to other viral communicable diseases: Secondary | ICD-10-CM | POA: Insufficient documentation

## 2019-04-06 LAB — SARS CORONAVIRUS 2 (TAT 6-24 HRS): SARS Coronavirus 2: NEGATIVE

## 2019-04-07 ENCOUNTER — Inpatient Hospital Stay (HOSPITAL_COMMUNITY): Payer: BC Managed Care – PPO | Admitting: Certified Registered Nurse Anesthetist

## 2019-04-07 ENCOUNTER — Encounter (HOSPITAL_COMMUNITY): Payer: Self-pay | Admitting: General Practice

## 2019-04-07 ENCOUNTER — Encounter (HOSPITAL_COMMUNITY)
Admission: RE | Disposition: A | Discharge status: Home / Self Care | Payer: Self-pay | Source: Home / Self Care | Attending: Neurosurgery

## 2019-04-07 ENCOUNTER — Other Ambulatory Visit: Payer: Self-pay

## 2019-04-07 ENCOUNTER — Inpatient Hospital Stay (HOSPITAL_COMMUNITY)
Admission: RE | Admit: 2019-04-07 | Discharge: 2019-04-08 | DRG: 497 | Disposition: A | Discharge status: Home / Self Care | Payer: BC Managed Care – PPO | Attending: Neurosurgery | Admitting: Neurosurgery

## 2019-04-07 DIAGNOSIS — J45909 Unspecified asthma, uncomplicated: Secondary | ICD-10-CM | POA: Diagnosis present

## 2019-04-07 DIAGNOSIS — G8929 Other chronic pain: Secondary | ICD-10-CM | POA: Diagnosis present

## 2019-04-07 DIAGNOSIS — T84226A Displacement of internal fixation device of vertebrae, initial encounter: Secondary | ICD-10-CM | POA: Diagnosis present

## 2019-04-07 DIAGNOSIS — Z79890 Hormone replacement therapy (postmenopausal): Secondary | ICD-10-CM | POA: Diagnosis not present

## 2019-04-07 DIAGNOSIS — F17210 Nicotine dependence, cigarettes, uncomplicated: Secondary | ICD-10-CM | POA: Diagnosis present

## 2019-04-07 DIAGNOSIS — F418 Other specified anxiety disorders: Secondary | ICD-10-CM | POA: Diagnosis present

## 2019-04-07 DIAGNOSIS — Y831 Surgical operation with implant of artificial internal device as the cause of abnormal reaction of the patient, or of later complication, without mention of misadventure at the time of the procedure: Secondary | ICD-10-CM | POA: Diagnosis present

## 2019-04-07 DIAGNOSIS — T8484XA Pain due to internal orthopedic prosthetic devices, implants and grafts, initial encounter: Secondary | ICD-10-CM | POA: Diagnosis present

## 2019-04-07 DIAGNOSIS — E039 Hypothyroidism, unspecified: Secondary | ICD-10-CM | POA: Diagnosis present

## 2019-04-07 HISTORY — PX: HARDWARE REMOVAL: SHX979

## 2019-04-07 LAB — POCT PREGNANCY, URINE: Preg Test, Ur: NEGATIVE

## 2019-04-07 SURGERY — REMOVAL, HARDWARE
Anesthesia: General | Site: Back

## 2019-04-07 MED ORDER — DEXMEDETOMIDINE HCL IN NACL 200 MCG/50ML IV SOLN
INTRAVENOUS | Status: DC | PRN
  Administered 2019-04-07: 20 ug via INTRAVENOUS

## 2019-04-07 MED ORDER — ONDANSETRON HCL 4 MG/2ML IJ SOLN: 4.0000 mg | Freq: Four times a day (QID) | INTRAMUSCULAR | Status: DC | PRN

## 2019-04-07 MED ORDER — LIDOCAINE HCL (CARDIAC) PF 100 MG/5ML IV SOSY
PREFILLED_SYRINGE | INTRAVENOUS | Status: DC | PRN
  Administered 2019-04-07: 100 mg via INTRAVENOUS

## 2019-04-07 MED ORDER — KETAMINE HCL 10 MG/ML IJ SOLN
INTRAMUSCULAR | Status: DC | PRN
  Administered 2019-04-07 (×2): 50 mg via INTRAVENOUS

## 2019-04-07 MED ORDER — ZOLPIDEM TARTRATE 5 MG PO TABS
10.0000 mg | ORAL_TABLET | Freq: Every day | ORAL | Status: DC
  Administered 2019-04-07: 22:00:00 10 mg via ORAL
  Filled 2019-04-07: qty 2

## 2019-04-07 MED ORDER — KETOROLAC TROMETHAMINE 30 MG/ML IJ SOLN
INTRAMUSCULAR | Status: AC
  Filled 2019-04-07: qty 1

## 2019-04-07 MED ORDER — PROPOFOL 10 MG/ML IV BOLUS
INTRAVENOUS | Status: AC
  Filled 2019-04-07: qty 20

## 2019-04-07 MED ORDER — DEXAMETHASONE SODIUM PHOSPHATE 10 MG/ML IJ SOLN: 10.0000 mg | Freq: Once | INTRAMUSCULAR | Status: DC

## 2019-04-07 MED ORDER — MIDAZOLAM HCL 2 MG/2ML IJ SOLN
INTRAMUSCULAR | Status: AC
  Filled 2019-04-07: qty 2

## 2019-04-07 MED ORDER — FENTANYL CITRATE (PF) 100 MCG/2ML IJ SOLN
50.0000 ug | INTRAMUSCULAR | Status: AC | PRN
  Administered 2019-04-07 (×2): 100 ug via INTRAVENOUS

## 2019-04-07 MED ORDER — MENTHOL 3 MG MT LOZG: 1.0000 | LOZENGE | OROMUCOSAL | Status: DC | PRN

## 2019-04-07 MED ORDER — HYDROMORPHONE HCL 1 MG/ML IJ SOLN: 1.0000 mg | INTRAMUSCULAR | Status: DC | PRN

## 2019-04-07 MED ORDER — LIDOCAINE 5 % EX PTCH
3.0000 | MEDICATED_PATCH | Freq: Every day | CUTANEOUS | Status: DC | PRN
  Filled 2019-04-07: qty 3

## 2019-04-07 MED ORDER — CHLORHEXIDINE GLUCONATE CLOTH 2 % EX PADS: 6.0000 | MEDICATED_PAD | Freq: Once | CUTANEOUS | Status: DC

## 2019-04-07 MED ORDER — CEFAZOLIN SODIUM-DEXTROSE 2-4 GM/100ML-% IV SOLN: 2.0000 g | Freq: Three times a day (TID) | INTRAVENOUS | Status: DC

## 2019-04-07 MED ORDER — GABAPENTIN 400 MG PO CAPS
800.0000 mg | ORAL_CAPSULE | Freq: Three times a day (TID) | ORAL | Status: DC
  Administered 2019-04-07 (×2): 800 mg via ORAL
  Filled 2019-04-07 (×2): qty 2

## 2019-04-07 MED ORDER — ONDANSETRON HCL 4 MG PO TABS: 4.0000 mg | ORAL_TABLET | Freq: Four times a day (QID) | ORAL | Status: DC | PRN

## 2019-04-07 MED ORDER — ACETAMINOPHEN 650 MG RE SUPP: 650.0000 mg | RECTAL | Status: DC | PRN

## 2019-04-07 MED ORDER — DEXAMETHASONE SODIUM PHOSPHATE 10 MG/ML IJ SOLN
INTRAMUSCULAR | Status: DC | PRN
  Administered 2019-04-07: 5 mg via INTRAVENOUS

## 2019-04-07 MED ORDER — VANCOMYCIN HCL IN DEXTROSE 1-5 GM/200ML-% IV SOLN: 1000.0000 mg | Freq: Once | INTRAVENOUS | Status: DC

## 2019-04-07 MED ORDER — AMPHETAMINE-DEXTROAMPHETAMINE 10 MG PO TABS: 20.0000 mg | ORAL_TABLET | Freq: Three times a day (TID) | ORAL | Status: DC | PRN

## 2019-04-07 MED ORDER — PANTOPRAZOLE SODIUM 40 MG PO TBEC
40.0000 mg | DELAYED_RELEASE_TABLET | Freq: Every day | ORAL | Status: DC
  Administered 2019-04-07: 22:00:00 40 mg via ORAL
  Filled 2019-04-07: qty 1

## 2019-04-07 MED ORDER — HYDROMORPHONE HCL 1 MG/ML IJ SOLN
INTRAMUSCULAR | Status: DC | PRN
  Administered 2019-04-07: 0.5 mg via INTRAVENOUS

## 2019-04-07 MED ORDER — ONDANSETRON HCL 4 MG/2ML IJ SOLN
INTRAMUSCULAR | Status: AC
  Filled 2019-04-07: qty 2

## 2019-04-07 MED ORDER — MIDAZOLAM HCL 2 MG/2ML IJ SOLN
2.0000 mg | Freq: Once | INTRAMUSCULAR | Status: AC
  Administered 2019-04-07: 12:00:00 2 mg via INTRAVENOUS

## 2019-04-07 MED ORDER — PANTOPRAZOLE SODIUM 40 MG IV SOLR: 40.0000 mg | Freq: Every day | INTRAVENOUS | Status: DC

## 2019-04-07 MED ORDER — BUPIVACAINE HCL (PF) 0.25 % IJ SOLN
INTRAMUSCULAR | Status: AC
  Filled 2019-04-07: qty 30

## 2019-04-07 MED ORDER — FENTANYL CITRATE (PF) 100 MCG/2ML IJ SOLN
INTRAMUSCULAR | Status: AC
  Administered 2019-04-07: 13:00:00 100 ug via INTRAVENOUS
  Filled 2019-04-07: qty 2

## 2019-04-07 MED ORDER — TIZANIDINE HCL 4 MG PO TABS
4.0000 mg | ORAL_TABLET | Freq: Three times a day (TID) | ORAL | Status: DC | PRN
  Administered 2019-04-07: 19:00:00 12 mg via ORAL
  Administered 2019-04-08: 04:00:00 8 mg via ORAL
  Filled 2019-04-07: qty 2
  Filled 2019-04-07: qty 3

## 2019-04-07 MED ORDER — PROMETHAZINE HCL 25 MG/ML IJ SOLN: 6.2500 mg | INTRAMUSCULAR | Status: DC | PRN

## 2019-04-07 MED ORDER — LIDOCAINE 2% (20 MG/ML) 5 ML SYRINGE
INTRAMUSCULAR | Status: DC | PRN
  Administered 2019-04-07: 1.5 mg/kg/h via INTRAVENOUS

## 2019-04-07 MED ORDER — DIVALPROEX SODIUM ER 500 MG PO TB24: 1500.0000 mg | ORAL_TABLET | Freq: Every day | ORAL | Status: DC

## 2019-04-07 MED ORDER — KETAMINE HCL 50 MG/5ML IJ SOSY
PREFILLED_SYRINGE | INTRAMUSCULAR | Status: AC
  Filled 2019-04-07: qty 5

## 2019-04-07 MED ORDER — PROMETHAZINE HCL 25 MG PO TABS: 25.0000 mg | ORAL_TABLET | Freq: Three times a day (TID) | ORAL | Status: DC | PRN

## 2019-04-07 MED ORDER — ROCURONIUM BROMIDE 10 MG/ML (PF) SYRINGE
PREFILLED_SYRINGE | INTRAVENOUS | Status: AC
  Filled 2019-04-07: qty 10

## 2019-04-07 MED ORDER — LEVOTHYROXINE SODIUM 25 MCG PO TABS
50.0000 ug | ORAL_TABLET | Freq: Every day | ORAL | Status: DC
  Administered 2019-04-08: 50 ug via ORAL
  Filled 2019-04-07: qty 2

## 2019-04-07 MED ORDER — FENTANYL CITRATE (PF) 250 MCG/5ML IJ SOLN
INTRAMUSCULAR | Status: AC
  Filled 2019-04-07: qty 5

## 2019-04-07 MED ORDER — TRAMADOL HCL 50 MG PO TABS: 100.0000 mg | ORAL_TABLET | Freq: Four times a day (QID) | ORAL | Status: DC | PRN

## 2019-04-07 MED ORDER — ROCURONIUM BROMIDE 10 MG/ML (PF) SYRINGE
PREFILLED_SYRINGE | INTRAVENOUS | Status: DC | PRN
  Administered 2019-04-07: 30 mg via INTRAVENOUS

## 2019-04-07 MED ORDER — ALUM & MAG HYDROXIDE-SIMETH 200-200-20 MG/5ML PO SUSP: 30.0000 mL | Freq: Four times a day (QID) | ORAL | Status: DC | PRN

## 2019-04-07 MED ORDER — LIDOCAINE 2% (20 MG/ML) 5 ML SYRINGE
INTRAMUSCULAR | Status: AC
  Filled 2019-04-07: qty 5

## 2019-04-07 MED ORDER — THROMBIN 5000 UNITS EX SOLR
CUTANEOUS | Status: AC
  Filled 2019-04-07: qty 10000

## 2019-04-07 MED ORDER — FENTANYL CITRATE (PF) 250 MCG/5ML IJ SOLN
INTRAMUSCULAR | Status: DC | PRN
  Administered 2019-04-07 (×2): 100 ug via INTRAVENOUS
  Administered 2019-04-07: 50 ug via INTRAVENOUS

## 2019-04-07 MED ORDER — OXYCODONE HCL 5 MG/5ML PO SOLN: 5.0000 mg | Freq: Once | ORAL | Status: AC | PRN

## 2019-04-07 MED ORDER — LEVONORGESTREL 20 MCG/24HR IU IUD: 1.0000 | INTRAUTERINE_SYSTEM | Freq: Once | INTRAUTERINE | Status: DC

## 2019-04-07 MED ORDER — ALPRAZOLAM 0.5 MG PO TABS
1.0000 mg | ORAL_TABLET | Freq: Every day | ORAL | Status: DC | PRN
  Administered 2019-04-07 – 2019-04-08 (×3): 1 mg via ORAL
  Filled 2019-04-07 (×3): qty 2

## 2019-04-07 MED ORDER — THROMBIN 5000 UNITS EX SOLR
CUTANEOUS | Status: DC | PRN
  Administered 2019-04-07: 10000 [IU] via TOPICAL

## 2019-04-07 MED ORDER — OXYCODONE HCL 5 MG PO TABS
10.0000 mg | ORAL_TABLET | ORAL | Status: DC | PRN
  Administered 2019-04-07 – 2019-04-08 (×6): 10 mg via ORAL
  Filled 2019-04-07 (×5): qty 2

## 2019-04-07 MED ORDER — ONDANSETRON HCL 4 MG/2ML IJ SOLN
INTRAMUSCULAR | Status: DC | PRN
  Administered 2019-04-07: 4 mg via INTRAVENOUS

## 2019-04-07 MED ORDER — VANCOMYCIN HCL IN DEXTROSE 1-5 GM/200ML-% IV SOLN
1000.0000 mg | INTRAVENOUS | Status: AC
  Administered 2019-04-07: 11:00:00 1000 mg via INTRAVENOUS
  Filled 2019-04-07: qty 200

## 2019-04-07 MED ORDER — BUPIVACAINE LIPOSOME 1.3 % IJ SUSP
20.0000 mL | INTRAMUSCULAR | Status: DC
  Filled 2019-04-07: qty 20

## 2019-04-07 MED ORDER — SODIUM CHLORIDE 0.9 % IV SOLN: 250.0000 mL | INTRAVENOUS | Status: DC

## 2019-04-07 MED ORDER — SUCCINYLCHOLINE CHLORIDE 200 MG/10ML IV SOSY
PREFILLED_SYRINGE | INTRAVENOUS | Status: AC
  Filled 2019-04-07: qty 10

## 2019-04-07 MED ORDER — KETOROLAC TROMETHAMINE 30 MG/ML IJ SOLN
30.0000 mg | Freq: Once | INTRAMUSCULAR | Status: AC | PRN
  Administered 2019-04-07: 15:00:00 30 mg via INTRAVENOUS

## 2019-04-07 MED ORDER — MIDAZOLAM HCL 2 MG/2ML IJ SOLN
INTRAMUSCULAR | Status: DC | PRN
  Administered 2019-04-07: 2 mg via INTRAVENOUS

## 2019-04-07 MED ORDER — PANTOPRAZOLE SODIUM 40 MG PO TBEC: 40.0000 mg | DELAYED_RELEASE_TABLET | Freq: Every day | ORAL | Status: DC

## 2019-04-07 MED ORDER — SODIUM CHLORIDE 0.9 % IV SOLN
INTRAVENOUS | Status: DC | PRN
  Administered 2019-04-07: 500 mL

## 2019-04-07 MED ORDER — LACTATED RINGERS IV SOLN
INTRAVENOUS | Status: DC
  Administered 2019-04-07: 11:00:00 via INTRAVENOUS

## 2019-04-07 MED ORDER — ACETAMINOPHEN 325 MG PO TABS
650.0000 mg | ORAL_TABLET | ORAL | Status: DC | PRN
  Administered 2019-04-08: 650 mg via ORAL
  Filled 2019-04-07: qty 2

## 2019-04-07 MED ORDER — PHENOL 1.4 % MT LIQD: 1.0000 | OROMUCOSAL | Status: DC | PRN

## 2019-04-07 MED ORDER — HYDROMORPHONE HCL 1 MG/ML IJ SOLN
0.2500 mg | INTRAMUSCULAR | Status: DC | PRN
  Administered 2019-04-07 (×4): 0.5 mg via INTRAVENOUS

## 2019-04-07 MED ORDER — SODIUM CHLORIDE 0.9% FLUSH: 3.0000 mL | Freq: Two times a day (BID) | INTRAVENOUS | Status: DC

## 2019-04-07 MED ORDER — DEXMEDETOMIDINE HCL IN NACL 200 MCG/50ML IV SOLN
INTRAVENOUS | Status: AC
  Filled 2019-04-07: qty 50

## 2019-04-07 MED ORDER — LIDOCAINE-EPINEPHRINE 1 %-1:100000 IJ SOLN
INTRAMUSCULAR | Status: AC
  Filled 2019-04-07: qty 1

## 2019-04-07 MED ORDER — AZELASTINE HCL 0.1 % NA SOLN: 2.0000 | Freq: Every day | NASAL | Status: DC

## 2019-04-07 MED ORDER — HYDROMORPHONE HCL 1 MG/ML IJ SOLN
INTRAMUSCULAR | Status: AC
  Filled 2019-04-07: qty 0.5

## 2019-04-07 MED ORDER — IBUPROFEN 200 MG PO TABS: 800.0000 mg | ORAL_TABLET | Freq: Three times a day (TID) | ORAL | Status: DC | PRN

## 2019-04-07 MED ORDER — ALBUTEROL SULFATE (2.5 MG/3ML) 0.083% IN NEBU: 3.0000 mL | INHALATION_SOLUTION | RESPIRATORY_TRACT | Status: DC | PRN

## 2019-04-07 MED ORDER — SUCCINYLCHOLINE CHLORIDE 200 MG/10ML IV SOSY
PREFILLED_SYRINGE | INTRAVENOUS | Status: DC | PRN
  Administered 2019-04-07: 120 mg via INTRAVENOUS

## 2019-04-07 MED ORDER — HEMOSTATIC AGENTS (NO CHARGE) OPTIME
TOPICAL | Status: DC | PRN
  Administered 2019-04-07: 1 via TOPICAL

## 2019-04-07 MED ORDER — HYDROMORPHONE HCL 1 MG/ML IJ SOLN
INTRAMUSCULAR | Status: AC
  Filled 2019-04-07: qty 2

## 2019-04-07 MED ORDER — NICOTINE 14 MG/24HR TD PT24
14.0000 mg | MEDICATED_PATCH | Freq: Every day | TRANSDERMAL | Status: DC
  Administered 2019-04-07: 17:00:00 14 mg via TRANSDERMAL
  Filled 2019-04-07: qty 1

## 2019-04-07 MED ORDER — OXYCODONE HCL 5 MG PO TABS
ORAL_TABLET | ORAL | Status: AC
  Filled 2019-04-07: qty 3

## 2019-04-07 MED ORDER — CYCLOBENZAPRINE HCL 10 MG PO TABS
ORAL_TABLET | ORAL | Status: AC
  Filled 2019-04-07: qty 1

## 2019-04-07 MED ORDER — LIDOCAINE-EPINEPHRINE 1 %-1:100000 IJ SOLN
INTRAMUSCULAR | Status: DC | PRN
  Administered 2019-04-07: 15 mL

## 2019-04-07 MED ORDER — DEXAMETHASONE SODIUM PHOSPHATE 10 MG/ML IJ SOLN
INTRAMUSCULAR | Status: AC
  Filled 2019-04-07: qty 1

## 2019-04-07 MED ORDER — BUPRENORPHINE HCL-NALOXONE HCL 8-2 MG SL SUBL: 1.0000 | SUBLINGUAL_TABLET | Freq: Every day | SUBLINGUAL | Status: DC

## 2019-04-07 MED ORDER — MIDAZOLAM HCL 2 MG/2ML IJ SOLN
INTRAMUSCULAR | Status: AC
  Administered 2019-04-07: 12:00:00 2 mg via INTRAVENOUS
  Filled 2019-04-07: qty 2

## 2019-04-07 MED ORDER — SODIUM CHLORIDE 0.9% FLUSH: 3.0000 mL | INTRAVENOUS | Status: DC | PRN

## 2019-04-07 MED ORDER — ONDANSETRON HCL 4 MG PO TABS: 8.0000 mg | ORAL_TABLET | Freq: Every day | ORAL | Status: DC | PRN

## 2019-04-07 MED ORDER — OXYCODONE HCL 5 MG PO TABS
5.0000 mg | ORAL_TABLET | Freq: Once | ORAL | Status: AC | PRN
  Administered 2019-04-07: 5 mg via ORAL

## 2019-04-07 MED ORDER — CYCLOBENZAPRINE HCL 10 MG PO TABS
10.0000 mg | ORAL_TABLET | Freq: Three times a day (TID) | ORAL | Status: DC | PRN
  Administered 2019-04-07: 15:00:00 10 mg via ORAL

## 2019-04-07 MED ORDER — BUDESONIDE 0.25 MG/2ML IN SUSP
0.2500 mg | Freq: Two times a day (BID) | RESPIRATORY_TRACT | Status: DC
  Filled 2019-04-07 (×2): qty 2

## 2019-04-07 MED ORDER — BUPIVACAINE HCL (PF) 0.25 % IJ SOLN
INTRAMUSCULAR | Status: DC | PRN
  Administered 2019-04-07: 20 mL

## 2019-04-07 MED ORDER — SUGAMMADEX SODIUM 200 MG/2ML IV SOLN
INTRAVENOUS | Status: DC | PRN
  Administered 2019-04-07: 180 mg via INTRAVENOUS

## 2019-04-07 MED ORDER — PROPOFOL 10 MG/ML IV BOLUS
INTRAVENOUS | Status: DC | PRN
  Administered 2019-04-07: 200 mg via INTRAVENOUS

## 2019-04-07 SURGICAL SUPPLY — 52 items
ADH SKN CLS APL DERMABOND .7 (GAUZE/BANDAGES/DRESSINGS) ×2
APL SKNCLS STERI-STRIP NONHPOA (GAUZE/BANDAGES/DRESSINGS) ×1
BAG DECANTER FOR FLEXI CONT (MISCELLANEOUS) ×2 IMPLANT
BENZOIN TINCTURE PRP APPL 2/3 (GAUZE/BANDAGES/DRESSINGS) ×2 IMPLANT
BLADE CLIPPER SURG (BLADE) IMPLANT
BUR MATCHSTICK NEURO 3.0 LAGG (BURR) ×1 IMPLANT
BUR PRECISION FLUTE 6.0 (BURR) IMPLANT
CANISTER SUCT 3000ML PPV (MISCELLANEOUS) ×1 IMPLANT
CARTRIDGE OIL MAESTRO DRILL (MISCELLANEOUS) ×1 IMPLANT
COVER WAND RF STERILE (DRAPES) ×1 IMPLANT
DECANTER SPIKE VIAL GLASS SM (MISCELLANEOUS) ×2 IMPLANT
DERMABOND ADVANCED (GAUZE/BANDAGES/DRESSINGS) ×2
DERMABOND ADVANCED .7 DNX12 (GAUZE/BANDAGES/DRESSINGS) ×1 IMPLANT
DIFFUSER DRILL AIR PNEUMATIC (MISCELLANEOUS) ×1 IMPLANT
DRAPE HALF SHEET 40X57 (DRAPES) IMPLANT
DRAPE LAPAROTOMY 100X72X124 (DRAPES) ×2 IMPLANT
DRAPE POUCH INSTRU U-SHP 10X18 (DRAPES) ×1 IMPLANT
DRAPE SURG 17X23 STRL (DRAPES) ×2 IMPLANT
DRSG OPSITE POSTOP 4X10 (GAUZE/BANDAGES/DRESSINGS) ×1 IMPLANT
ELECT REM PT RETURN 9FT ADLT (ELECTROSURGICAL) ×2
ELECTRODE REM PT RTRN 9FT ADLT (ELECTROSURGICAL) ×1 IMPLANT
GAUZE 4X4 16PLY RFD (DISPOSABLE) IMPLANT
GAUZE SPONGE 4X4 12PLY STRL (GAUZE/BANDAGES/DRESSINGS) ×2 IMPLANT
GLOVE BIO SURGEON STRL SZ7 (GLOVE) IMPLANT
GLOVE BIO SURGEON STRL SZ8 (GLOVE) ×2 IMPLANT
GLOVE BIOGEL PI IND STRL 7.0 (GLOVE) IMPLANT
GLOVE BIOGEL PI INDICATOR 7.0 (GLOVE)
GLOVE EXAM NITRILE XL STR (GLOVE) IMPLANT
GLOVE INDICATOR 8.5 STRL (GLOVE) ×2 IMPLANT
GOWN STRL REUS W/ TWL LRG LVL3 (GOWN DISPOSABLE) ×1 IMPLANT
GOWN STRL REUS W/ TWL XL LVL3 (GOWN DISPOSABLE) ×2 IMPLANT
GOWN STRL REUS W/TWL 2XL LVL3 (GOWN DISPOSABLE) IMPLANT
GOWN STRL REUS W/TWL LRG LVL3 (GOWN DISPOSABLE) ×2
GOWN STRL REUS W/TWL XL LVL3 (GOWN DISPOSABLE) ×4
KIT BASIN OR (CUSTOM PROCEDURE TRAY) ×2 IMPLANT
KIT TURNOVER KIT B (KITS) ×2 IMPLANT
NDL SPNL 22GX3.5 QUINCKE BK (NEEDLE) ×1 IMPLANT
NEEDLE HYPO 22GX1.5 SAFETY (NEEDLE) ×3 IMPLANT
NEEDLE SPNL 22GX3.5 QUINCKE BK (NEEDLE) ×2 IMPLANT
NS IRRIG 1000ML POUR BTL (IV SOLUTION) ×2 IMPLANT
OIL CARTRIDGE MAESTRO DRILL (MISCELLANEOUS)
PACK LAMINECTOMY NEURO (CUSTOM PROCEDURE TRAY) ×2 IMPLANT
SPONGE SURGIFOAM ABS GEL SZ50 (HEMOSTASIS) ×2 IMPLANT
STRIP CLOSURE SKIN 1/2X4 (GAUZE/BANDAGES/DRESSINGS) ×3 IMPLANT
SUT VIC AB 0 CT1 18XCR BRD8 (SUTURE) ×1 IMPLANT
SUT VIC AB 0 CT1 8-18 (SUTURE) ×6
SUT VIC AB 2-0 CT1 18 (SUTURE) ×4 IMPLANT
SUT VIC AB 4-0 PS2 27 (SUTURE) ×1 IMPLANT
SUT VICRYL 4-0 PS2 18IN ABS (SUTURE) ×1 IMPLANT
TOWEL GREEN STERILE (TOWEL DISPOSABLE) ×2 IMPLANT
TOWEL GREEN STERILE FF (TOWEL DISPOSABLE) ×2 IMPLANT
WATER STERILE IRR 1000ML POUR (IV SOLUTION) ×2 IMPLANT

## 2019-04-07 NOTE — Op Note (Signed)
Preoperative diagnosis: Painful hardware  Postoperative diagnosis: Same  Procedure: Exploration fusion move of hardware T9-L2  Surgeon: Dominica Severin Pallie Swigert  Assistant: Jovita Gamma  Anesthesia: General  EBL: Minimal  HPI: 38 year old female previously undergone a thoracic lumbar fusion for T12 burst fracture and did very well however last several weeks months is a progressive worsening back pain work-up revealed loosening of her L2 screws and what appeared to be a solid fusion above that so we recommended removal of hardware after extensive discussions with the patient she requested the entire construct be removed so we agreed to do this pending evaluation of her fusion.  I extensively the risks and benefits of the operation with her as well as perioperative course expectations of outcome and alternatives of surgery she understood and agreed to proceed forward.  Operative procedure: Patient brought into the OR was due to general anesthesia positioned prone on the Wilson frame her back was prepped and draped in routine sterile fashion roll incision was opened up and infiltrated with 10 cc lidocaine with epi midline incision was made then Bovie elect cautery was used take down to the fascia 2 separate fascial incisions were made directly over the construct over the hardware.  I exposed the screws from T9 down to L2 disconnected the knots and the rods and look to the fusion fusion did appear to be solid the entire construct moved as a unit when I lifted up on sequential pedicle screws.  So we proceeded forward with removal of all the pedicle screws the L2 screws were very loose all the other screws were solid.  Wounds and copiously irrigated with 6 hemostasis was maintained both fascial incisions were closed over the construct area and then the subtenons tissue was closed with Vicryl skin was closed running 4 subcuticular Dermabond benzoin Steri-Strips and a sterile dressing was applied patient recovery room  in stable condition.  At the end the case all needle counts sponge counts were correct.

## 2019-04-07 NOTE — Anesthesia Procedure Notes (Signed)
Procedure Name: Intubation Date/Time: 04/07/2019 1:27 PM Performed by: Raenette Rover, CRNA Pre-anesthesia Checklist: Patient identified, Suction available, Patient being monitored and Emergency Drugs available Patient Re-evaluated:Patient Re-evaluated prior to induction Oxygen Delivery Method: Circle system utilized Preoxygenation: Pre-oxygenation with 100% oxygen Induction Type: IV induction Ventilation: Mask ventilation without difficulty Laryngoscope Size: Mac and 3 Grade View: Grade I Tube type: Oral Tube size: 7.0 mm Number of attempts: 1 Airway Equipment and Method: Stylet Placement Confirmation: ETT inserted through vocal cords under direct vision,  positive ETCO2,  CO2 detector and breath sounds checked- equal and bilateral Secured at: 21 cm Tube secured with: Tape Dental Injury: Teeth and Oropharynx as per pre-operative assessment

## 2019-04-07 NOTE — Progress Notes (Signed)
Patient complaining of anxiety.  Received verbal order to give 2 mg Versed and repeat if needed.  Will continue to monitor patient.

## 2019-04-07 NOTE — Transfer of Care (Signed)
Immediate Anesthesia Transfer of Care Note  Patient: Barbara Rogers  Procedure(s) Performed: Removal of Thoracolumbar hardware (N/A Back)  Patient Location: PACU  Anesthesia Type:General  Level of Consciousness: awake, alert , oriented and patient cooperative  Airway & Oxygen Therapy: Patient Spontanous Breathing and Patient connected to nasal cannula oxygen  Post-op Assessment: Report given to RN and Post -op Vital signs reviewed and stable  Post vital signs: Reviewed and stable  Last Vitals:  Vitals Value Taken Time  BP 130/79 04/07/19 1439  Temp    Pulse 80 04/07/19 1440  Resp 13 04/07/19 1440  SpO2 98 % 04/07/19 1440  Vitals shown include unvalidated device data.  Last Pain:  Vitals:   04/07/19 1122  TempSrc:   PainSc: 6       Patients Stated Pain Goal: 3 (82/08/13 8871)  Complications: No apparent anesthesia complications

## 2019-04-07 NOTE — H&P (Addendum)
Barbara Rogers is an 38 y.o. female.   Chief Complaint: Back pain HPI: 38 year old female sustained a T12 burst fracture underwent thoracolumbar stabilization procedure was doing very well initially however some persistent and worsening back pain over the last several weeks months.  Work-up revealed loose screws at L2 remainder the construct seem to be intact hardware appear to be solid fusion seem to be intact.  So due to her progression of pain localized around the loose screws I recommended removal of hardware with cutting of the rod below the L1 screw and removal of bilateral L2 screws.  After careful consideration she has and requested to have all the hardware removed.  The imaging does appear to have a solid fusion and well-healed burst fracture so I agreed to assess her fusion and if it does appear solid we would remove the entire construct.  I extensively went over the risks and benefits of the procedure with her as well as perioperative course expectations of outcome and alternatives of surgery and she understands and agrees to proceed forward.  Past Medical History:  Diagnosis Date  . AKI (acute kidney injury) ([place]) 01/18/2015  . Allergy   . Anxiety   . Asthma    exacerbated by bronchitisi  . Asthma   . C3 cervical fracture ([place])   . C3 cervical fracture ([place]) 01/29/2016  . Cervical transverse process fracture ([place])   . Chronic left shoulder pain 08/2012  . Clavicle fracture   . Concussion 01/29/2016  . Depression   . 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 Dysmenorrhea   . Dysphagia   . Dyspnea   . Finger fracture, left 10/31/2012   LEFT 4th finger  . Insomnia   . Lumbar transverse process fracture ([place])   . MDD (major depressive disorder), recurrent episode, severe ([place]) 06/30/2016  . 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 Migraine without aura   . Mood disorder ([place])   . Nephrolithiasis   . OD (overdose of drug),  intentional self-harm, initial encounter ([place]) 06/26/2016  . PMDD (premenstrual dysphoric disorder)   . Pneumonia   . Respiratory failure ([place])   . Seizure-like activity ([place]) 06/30/2016   normal EEG, question pseudoseizures  . T12 burst fracture ([place])   . Traumatic hemo-pneumothorax   . Traumatic hemopneumothorax 02/09/2016    Past Surgical History:  Procedure Laterality Date  . FRACTURE SURGERY    . POSTERIOR LUMBAR FUSION 4 LEVEL N/A 01/30/2016   Procedure: T9-L2 Posterior Stabilization, Posterior Lumbar Fusion with Pedicle Screws;  Surgeon: Kary Kos, MD;  Location: [facility];  Service: Neurosurgery;  Laterality: N/A;    Family History  Problem Relation Age of Onset  . Migraines Mother   . Mental illness Mother   . Heart disease Father   . Alcohol abuse Father   . Arthritis Maternal Grandmother   . Thyroid disease Maternal Grandmother   . Heart disease Maternal Grandfather        AMI 1996, 2014  . Anemia Maternal Grandfather        bone marrow dysfunction   Social History:  reports that she has been smoking cigarettes. She has been smoking about 1.00 pack per day. She has never used smokeless tobacco. She reports current alcohol use. She reports current drug use. Frequency: 1.00 time per week. Drug: Marijuana.  Allergies:  Allergies  Allergen Reactions  . Penicillins Other (See Comments)    Unknown Has patient had a PCN reaction causing immediate  rash, facial/tongue/throat swelling, SOB or lightheadedness with hypotension:  unknown Has patient had a PCN reaction causing severe rash involving mucus membranes or skin necrosis:  unknown Has patient had a PCN reaction that required hospitalization  unknown Has patient had a PCN reaction occurring within the last 10 years: unknown If all of the above answers are "NO", the  . Progestins     Extreme moodiness  . Progestins Other (See Comments)    Extreme moodiness  . Sinequan [Doxepin] Other (See Comments) and Hypertension     Hallucinations, delusions, severe agitation  . Sinequan [Doxepin] Other (See Comments)    hallucinations    Medications Prior to Admission  Medication Sig Dispense Refill  . albuterol (PROAIR HFA) 108 (90 Base) MCG/ACT inhaler TAKE 2 PUFFS EVERY 4 HOURS AS NEEDED (Patient taking differently: Inhale 2 puffs into the lungs every 4 (four) hours as needed for wheezing or shortness of breath. TAKE 2 PUFFS EVERY 4 HOURS AS NEEDED) 25.5 Inhaler 1  . ALPRAZolam (XANAX) 1 MG tablet Take 1 mg by mouth See admin instructions. Up to 6 times daily as needed    . amphetamine-dextroamphetamine (ADDERALL) 20 MG tablet Take 20 mg by mouth 3 (three) times daily as needed (ADD).     Marland Kitchen azelastine (ASTELIN) 0.1 % nasal spray Place 2 sprays into both nostrils 2 (two) times daily. Use in each nostril as directed (Patient taking differently: Place 2 sprays into both nostrils daily. Use in each nostril as directed) 30 mL 0  . AZO-CRANBERRY PO Take 1 tablet by mouth daily.    . beclomethasone (QVAR) 80 MCG/ACT inhaler Inhale 1 puff into the lungs 2 (two) times daily.    . buprenorphine-naloxone (SUBOXONE) 8-2 mg SUBL SL tablet Place 1 tablet under the tongue daily.    Marland Kitchen dexlansoprazole (DEXILANT) 60 MG capsule Take 1 capsule (60 mg total) by mouth daily. 90 capsule 3  . divalproex (DEPAKOTE ER) 500 MG 24 hr tablet Take 1,500 mg by mouth at bedtime.     . gabapentin (NEURONTIN) 800 MG tablet Take 800 mg by mouth 3 (three) times daily.    Marland Kitchen levonorgestrel (MIRENA) 20 MCG/24HR IUD 1 each by Intrauterine route once.    . lidocaine (LIDODERM) 5 % Place 3 patches onto the skin daily. Remove & Discard patch within 12 hours or as directed by provider (Patient taking differently: Place 3 patches onto the skin daily as needed (pain). Remove & Discard patch within 12 hours or as directed by provider) 270 patch 3  . ondansetron (ZOFRAN) 8 MG tablet Take 8 mg by mouth daily as needed for nausea.     . promethazine (PHENERGAN) 25  MG tablet Take 1 tablet (25 mg total) by mouth every 8 (eight) hours as needed for nausea or vomiting. 20 tablet 0  . tiZANidine (ZANAFLEX) 4 MG tablet TAKE 3 TABLETS BY MOUTH 3 TIMES A DAY (Patient taking differently: Take 4-12 mg by mouth 3 (three) times daily as needed for muscle spasms. ) 270 tablet 0  . traMADol (ULTRAM) 50 MG tablet Take 100 mg by mouth every 6 (six) hours as needed (pain).     Marland Kitchen zolpidem (AMBIEN) 10 MG tablet Take 10 mg by mouth at bedtime.     Marland Kitchen ibuprofen (ADVIL,MOTRIN) 800 MG tablet TAKE 1 TABLET BY MOUTH EVERY 8 HOURS AS NEEDED (Patient taking differently: Take 800 mg by mouth every 8 (eight) hours as needed (pain). ) 270 tablet 0  . levothyroxine (SYNTHROID, LEVOTHROID) 50  MCG tablet TAKE 1 TABLET BY MOUTH EVERY DAY 90 tablet 2  . SAPHRIS 10 MG SUBL       Results for orders placed or performed during the hospital encounter of 04/07/19 (from the past 48 hour(s))  Pregnancy, urine POC     Status: None   Collection Time: 04/07/19 11:32 AM  Result Value Ref Range   Preg Test, Ur NEGATIVE NEGATIVE    Comment:        THE SENSITIVITY OF THIS METHODOLOGY IS >24 mIU/mL    No results found.  Review of Systems  Musculoskeletal: Positive for back pain.    Blood pressure 110/75, pulse 76, temperature 98.7 F (37.1 C), temperature source Oral, resp. rate 12, height 5\' 7"  (1.702 m), weight 87.9 kg, last menstrual period 04/06/2019, SpO2 100 %. Physical Exam  Constitutional: She is oriented to person, place, and time. She appears well-developed.  HENT:  Head: Normocephalic.  Eyes: Pupils are equal, round, and reactive to light.  Neck: Normal range of motion.  Respiratory: Effort normal.  GI: Soft.  Musculoskeletal: Normal range of motion.  Neurological: She is alert and oriented to person, place, and time. She has normal strength. GCS eye subscore is 4. GCS verbal subscore is 5. GCS motor subscore is 6.  Strength of the 5 iliopsoas, quads, hamstrings, gastroc, and  tibialis, EHL.     Assessment/Plan 38 year old female presents for hardware removal  Shameika Speelman P, MD 04/07/2019, 12:46 PM

## 2019-04-07 NOTE — Anesthesia Postprocedure Evaluation (Signed)
Anesthesia Post Note  Patient: Marlicia Sroka  Procedure(s) Performed: Removal of Thoracolumbar hardware (N/A Back)     Patient location during evaluation: PACU Anesthesia Type: General Level of consciousness: awake and alert Pain management: pain level controlled Vital Signs Assessment: post-procedure vital signs reviewed and stable Respiratory status: spontaneous breathing, nonlabored ventilation, respiratory function stable and patient connected to nasal cannula oxygen Cardiovascular status: blood pressure returned to baseline and stable Postop Assessment: no apparent nausea or vomiting Anesthetic complications: no    Last Vitals:  Vitals:   04/07/19 1155 04/07/19 1439  BP: 110/75 130/79  Pulse: 76 77  Resp: 12 15  Temp:  36.4 C  SpO2: 100% 99%    Last Pain:  Vitals:   04/07/19 1122  TempSrc:   PainSc: 6                  Jonthan Leite S

## 2019-04-07 NOTE — Anesthesia Preprocedure Evaluation (Addendum)
Anesthesia Evaluation  Patient identified by MRN, date of birth, ID band Patient awake    Reviewed: Allergy & Precautions, NPO status , Patient's Chart, lab work & pertinent test results  Airway Mallampati: II  TM Distance: >3 FB Neck ROM: Full    Dental no notable dental hx.    Pulmonary asthma , Current Smoker,    Pulmonary exam normal breath sounds clear to auscultation       Cardiovascular negative cardio ROS Normal cardiovascular exam Rhythm:Regular Rate:Normal     Neuro/Psych Anxiety Depression Schizophrenia Chronic pain on suboxone negative neurological ROS     GI/Hepatic negative GI ROS, Neg liver ROS,   Endo/Other  Hypothyroidism   Renal/GU negative Renal ROS  negative genitourinary   Musculoskeletal negative musculoskeletal ROS (+)   Abdominal   Peds negative pediatric ROS (+)  Hematology negative hematology ROS (+)   Anesthesia Other Findings   Reproductive/Obstetrics negative OB ROS                             Anesthesia Physical Anesthesia Plan  ASA: III  Anesthesia Plan: General   Post-op Pain Management:    Induction: Intravenous and Rapid sequence  PONV Risk Score and Plan: 3 and Ondansetron, Dexamethasone, Treatment may vary due to age or medical condition and Midazolam  Airway Management Planned: Oral ETT  Additional Equipment:   Intra-op Plan:   Post-operative Plan: Extubation in OR  Informed Consent: I have reviewed the patients History and Physical, chart, labs and discussed the procedure including the risks, benefits and alternatives for the proposed anesthesia with the patient or authorized representative who has indicated his/her understanding and acceptance.     Dental advisory given  Plan Discussed with: CRNA and Surgeon  Anesthesia Plan Comments: (Ketamine boluses intra-op up to 80 mg. Lidocaine infusion during surgery at 1.5mg /kg/hr, d/c  when surgeon begins to close)       Anesthesia Quick Evaluation

## 2019-04-08 ENCOUNTER — Encounter (HOSPITAL_COMMUNITY): Payer: Self-pay | Admitting: Neurosurgery

## 2019-04-08 MED ORDER — HYDROCODONE-ACETAMINOPHEN 5-325 MG PO TABS: 1.0000 | ORAL_TABLET | Freq: Four times a day (QID) | ORAL | 0 refills | Status: DC | PRN

## 2019-04-08 MED ORDER — METHOCARBAMOL 500 MG PO TABS: 500.0000 mg | ORAL_TABLET | Freq: Four times a day (QID) | ORAL | 0 refills | Status: DC

## 2019-04-08 MED ORDER — OXYCODONE-ACETAMINOPHEN 5-325 MG PO TABS: 1.0000 | ORAL_TABLET | ORAL | 0 refills | Status: DC | PRN

## 2019-04-08 NOTE — Discharge Summary (Addendum)
Physician Discharge Summary  Patient ID: Barbara Rogers MRN: [id number] DOB/AGE: 1981-07-19 38 y.o.  Admit date: 04/07/2019 Discharge date: 04/08/2019  Admission Diagnoses: painful hardware    Discharge Diagnoses: same   Discharged Condition: good  Hospital Course: The patient was admitted on 04/07/2019 and taken to the operating room where the patient underwent exploration of fusion and removal of hardware T9-L2. The patient tolerated the procedure well and was taken to the recovery room and then to the floor in stable condition. The hospital course was routine. There were no complications. The wound remained clean dry and intact. Pt had appropriate back soreness. No complaints of leg pain or new N/T/W. The patient remained afebrile with stable vital signs, and tolerated a regular diet. The patient continued to increase activities, and pain was well controlled with oral pain medications.   Consults: None  Significant Diagnostic Studies:  Results for orders placed or performed during the hospital encounter of 04/07/19  Pregnancy, urine POC  Result Value Ref Range   Preg Test, Ur NEGATIVE NEGATIVE    No results found.  Antibiotics:  Anti-infectives (From admission, onward)   Start     Dose/Rate Route Frequency Ordered Stop   04/07/19 2000  vancomycin (VANCOCIN) IVPB 1000 mg/200 mL premix     1,000 mg 200 mL/hr over 60 Minutes Intravenous  Once 04/07/19 1627     04/07/19 1615  ceFAZolin (ANCEF) IVPB 2g/100 mL premix  Status:  Discontinued     2 g 200 mL/hr over 30 Minutes Intravenous Every 8 hours 04/07/19 1614 04/07/19 1626   04/07/19 1352  bacitracin 50,000 Units in sodium chloride 0.9 % 500 mL irrigation  Status:  Discontinued       As needed 04/07/19 1352 04/07/19 1433   04/07/19 1100  vancomycin (VANCOCIN) IVPB 1000 mg/200 mL premix     1,000 mg 200 mL/hr over 60 Minutes Intravenous On call to O.R. 04/07/19 1052 04/07/19 1226      Discharge Exam: Blood pressure 116/74,  pulse 68, temperature 98 F (36.7 C), temperature source Oral, resp. rate 19, height 5\' 7"  (1.702 m), weight 87.9 kg, last menstrual period 04/06/2019, SpO2 100 %. Neurologic: Grossly normal Ambulating and voiding well, incision cdi  Discharge Medications:   Allergies as of 04/08/2019      Reactions   Penicillins Other (See Comments)   Unknown Has patient had a PCN reaction causing immediate rash, facial/tongue/throat swelling, SOB or lightheadedness with hypotension:  unknown Has patient had a PCN reaction causing severe rash involving mucus membranes or skin necrosis:  unknown Has patient had a PCN reaction that required hospitalization  unknown Has patient had a PCN reaction occurring within the last 10 years: unknown If all of the above answers are "NO", the   Progestins    Extreme moodiness   Progestins Other (See Comments)   Extreme moodiness   Sinequan [doxepin] Other (See Comments), Hypertension   Hallucinations, delusions, severe agitation   Sinequan [doxepin] Other (See Comments)   hallucinations      Medication List    TAKE these medications   Adderall 20 MG tablet Generic drug: amphetamine-dextroamphetamine Take 20 mg by mouth 3 (three) times daily as needed (ADD).   albuterol 108 (90 Base) MCG/ACT inhaler Commonly known as: ProAir HFA TAKE 2 PUFFS EVERY 4 HOURS AS NEEDED What changed:   how much to take  how to take this  when to take this  reasons to take this   ALPRAZolam 1 MG tablet Commonly known  as: XANAX Take 1 mg by mouth See admin instructions. Up to 6 times daily as needed   azelastine 0.1 % nasal spray Commonly known as: ASTELIN Place 2 sprays into both nostrils 2 (two) times daily. Use in each nostril as directed What changed: when to take this   AZO-CRANBERRY PO Take 1 tablet by mouth daily.   beclomethasone 80 MCG/ACT inhaler Commonly known as: QVAR Inhale 1 puff into the lungs 2 (two) times daily.   buprenorphine-naloxone 8-2 mg  Subl SL tablet Commonly known as: SUBOXONE Place 1 tablet under the tongue daily.   dexlansoprazole 60 MG capsule Commonly known as: DEXILANT Take 1 capsule (60 mg total) by mouth daily.   divalproex 500 MG 24 hr tablet Commonly known as: DEPAKOTE ER Take 1,500 mg by mouth at bedtime.   gabapentin 800 MG tablet Commonly known as: NEURONTIN Take 800 mg by mouth 3 (three) times daily.   HYDROcodone-acetaminophen 5-325 MG tablet Commonly known as: NORCO/VICODIN Take 1 tablet by mouth every 6 (six) hours as needed for moderate pain.   ibuprofen 800 MG tablet Commonly known as: ADVIL TAKE 1 TABLET BY MOUTH EVERY 8 HOURS AS NEEDED What changed: reasons to take this   levonorgestrel 20 MCG/24HR IUD Commonly known as: MIRENA 1 each by Intrauterine route once.   levothyroxine 50 MCG tablet Commonly known as: SYNTHROID TAKE 1 TABLET BY MOUTH EVERY DAY   lidocaine 5 % Commonly known as: LIDODERM Place 3 patches onto the skin daily. Remove & Discard patch within 12 hours or as directed by provider What changed:   when to take this  reasons to take this   methocarbamol 500 MG tablet Commonly known as: Robaxin Take 1 tablet (500 mg total) by mouth 4 (four) times daily.   ondansetron 8 MG tablet Commonly known as: ZOFRAN Take 8 mg by mouth daily as needed for nausea.   oxyCODONE-acetaminophen 5-325 MG tablet Commonly known as: Percocet Take 1 tablet by mouth every 4 (four) hours as needed for severe pain.   promethazine 25 MG tablet Commonly known as: PHENERGAN Take 1 tablet (25 mg total) by mouth every 8 (eight) hours as needed for nausea or vomiting.   Saphris 10 MG Subl Generic drug: Asenapine Maleate   tiZANidine 4 MG tablet Commonly known as: ZANAFLEX TAKE 3 TABLETS BY MOUTH 3 TIMES A DAY What changed: See the new instructions.   traMADol 50 MG tablet Commonly known as: ULTRAM Take 100 mg by mouth every 6 (six) hours as needed (pain).   zolpidem 10 MG  tablet Commonly known as: AMBIEN Take 10 mg by mouth at bedtime.       Disposition: home   Final Dx: removal of hardware T9-L2  Discharge Instructions     Remove dressing in 72 hours   Complete by: As directed    Call MD for:  difficulty breathing, headache or visual disturbances   Complete by: As directed    Call MD for:  hives   Complete by: As directed    Call MD for:  persistant dizziness or light-headedness   Complete by: As directed    Call MD for:  persistant nausea and vomiting   Complete by: As directed    Call MD for:  redness, tenderness, or signs of infection (pain, swelling, redness, odor or green/yellow discharge around incision site)   Complete by: As directed    Call MD for:  severe uncontrolled pain   Complete by: As directed  Call MD for:  temperature >100.4   Complete by: As directed    Diet - low sodium heart healthy   Complete by: As directed    Driving Restrictions   Complete by: As directed    No driving for 2 weeks, no riding in the car for 1 week   Increase activity slowly   Complete by: As directed    Lifting restrictions   Complete by: As directed    No lifting more than 8 lbs      Follow-up Information    Kary Kos, MD Follow up.   Specialty: Neurosurgery Contact information: [address] [place] [address] [place] [address] [phone]            Signed: Ocie Cornfield [facility] 04/08/2019, 8:33 AM

## 2019-04-08 NOTE — Plan of Care (Signed)
Patient alert and oriented, mae's well, voiding adequate amount of urine, swallowing without difficulty, no c/o pain at time of discharge. Patient discharged home with family. Script and discharged instructions given to patient. Patient and family stated understanding of instructions given. Patient has an appointment with Dr. Cram 

## 2019-07-14 ENCOUNTER — Other Ambulatory Visit: Payer: Self-pay | Admitting: Family Medicine

## 2019-07-14 DIAGNOSIS — G894 Chronic pain syndrome: Secondary | ICD-10-CM

## 2019-07-14 NOTE — Telephone Encounter (Signed)
Requested medication (s) are due for refill today: yes  Requested medication (s) are on the active medication list: yes  Last refill:  10/23/2018  Future visit scheduled: no  Notes to clinic: overdue for office visit  Review for refill   Requested Prescriptions  Pending Prescriptions Disp Refills   ibuprofen (ADVIL) 800 MG tablet [Pharmacy Med Name: IBUPROFEN 800 MG TABLET] 270 tablet 0    Sig: TAKE 1 TABLET BY MOUTH EVERY 8 HOURS AS NEEDED     Analgesics:  NSAIDS Failed - 07/14/2019  2:49 PM      Failed - Valid encounter within last 12 months    Recent Outpatient Visits          1 year ago Nephrolithiasis   Primary Care at [facility], Fenton Malling, MD   1 year ago Nausea and vomiting, intractability of vomiting not specified, unspecified vomiting type   [facility] at [facility], Lilia Argue, MD   1 year ago Dysuria   Primary Care at [facility], [place], [place]   2 years ago Acute pain of right shoulder   Primary Care at [facility], [place], [place]   2 years ago Chronic pain syndrome   Primary Care at [place], [place], [place] in normal range and within 360 days    Creat  Date Value Ref Range Status  03/26/2016 0.74 0.50 - 1.10 mg/dL Final   Creatinine, Ser  Date Value Ref Range Status  04/01/2019 0.78 0.44 - 1.00 mg/dL Final         Passed - HGB in normal range and within 360 days    Hemoglobin  Date Value Ref Range Status  04/01/2019 13.2 12.0 - 15.0 g/dL Final  01/13/2017 14.9 11.1 - 15.9 g/dL Final         Passed - Patient is not pregnant

## 2019-09-07 ENCOUNTER — Encounter (HOSPITAL_COMMUNITY): Payer: Self-pay

## 2019-09-07 ENCOUNTER — Emergency Department (HOSPITAL_COMMUNITY)
Admission: EM | Admit: 2019-09-07 | Discharge: 2019-09-07 | Disposition: A | Discharge status: Home / Self Care | Payer: BC Managed Care – PPO | Attending: Emergency Medicine | Admitting: Emergency Medicine

## 2019-09-07 ENCOUNTER — Emergency Department (HOSPITAL_COMMUNITY): Payer: BC Managed Care – PPO

## 2019-09-07 ENCOUNTER — Other Ambulatory Visit: Payer: Self-pay

## 2019-09-07 DIAGNOSIS — J45909 Unspecified asthma, uncomplicated: Secondary | ICD-10-CM | POA: Insufficient documentation

## 2019-09-07 DIAGNOSIS — Z79899 Other long term (current) drug therapy: Secondary | ICD-10-CM | POA: Insufficient documentation

## 2019-09-07 DIAGNOSIS — F17210 Nicotine dependence, cigarettes, uncomplicated: Secondary | ICD-10-CM | POA: Diagnosis not present

## 2019-09-07 DIAGNOSIS — R8281 Pyuria: Secondary | ICD-10-CM | POA: Diagnosis not present

## 2019-09-07 DIAGNOSIS — E039 Hypothyroidism, unspecified: Secondary | ICD-10-CM | POA: Insufficient documentation

## 2019-09-07 DIAGNOSIS — R109 Unspecified abdominal pain: Secondary | ICD-10-CM

## 2019-09-07 DIAGNOSIS — R1031 Right lower quadrant pain: Secondary | ICD-10-CM | POA: Diagnosis present

## 2019-09-07 LAB — URINALYSIS, ROUTINE W REFLEX MICROSCOPIC
Bilirubin Urine: NEGATIVE
Glucose, UA: NEGATIVE mg/dL
Hgb urine dipstick: NEGATIVE
Ketones, ur: NEGATIVE mg/dL
Nitrite: NEGATIVE
Protein, ur: NEGATIVE mg/dL
Specific Gravity, Urine: 1.025 (ref 1.005–1.030)
pH: 5 (ref 5.0–8.0)

## 2019-09-07 LAB — POC URINE PREG, ED: Preg Test, Ur: NEGATIVE

## 2019-09-07 MED ORDER — ONDANSETRON HCL 4 MG/2ML IJ SOLN
4.0000 mg | Freq: Once | INTRAMUSCULAR | Status: AC
  Administered 2019-09-07: 4 mg via INTRAVENOUS
  Filled 2019-09-07: qty 2

## 2019-09-07 MED ORDER — KETOROLAC TROMETHAMINE 30 MG/ML IJ SOLN
30.0000 mg | Freq: Once | INTRAMUSCULAR | Status: AC
  Administered 2019-09-07: 30 mg via INTRAVENOUS
  Filled 2019-09-07: qty 1

## 2019-09-07 MED ORDER — CEPHALEXIN 500 MG PO CAPS
500.0000 mg | ORAL_CAPSULE | Freq: Once | ORAL | Status: AC
  Administered 2019-09-07: 500 mg via ORAL
  Filled 2019-09-07: qty 1

## 2019-09-07 MED ORDER — HYDROMORPHONE HCL 1 MG/ML IJ SOLN
1.0000 mg | Freq: Once | INTRAMUSCULAR | Status: AC
  Administered 2019-09-07: 1 mg via INTRAVENOUS
  Filled 2019-09-07: qty 1

## 2019-09-07 MED ORDER — CEPHALEXIN 500 MG PO CAPS: 500.0000 mg | ORAL_CAPSULE | Freq: Four times a day (QID) | ORAL | 0 refills | Status: DC

## 2019-09-07 MED ORDER — FLUCONAZOLE 150 MG PO TABS: 150.0000 mg | ORAL_TABLET | Freq: Every day | ORAL | 0 refills | Status: AC

## 2019-09-07 NOTE — ED Provider Notes (Addendum)
[facility] EMERGENCY DEPARTMENT Provider Note   CSN: [id number] Arrival date & time: 09/07/19  0321   History Chief Complaint  Patient presents with  . Flank Pain    right    Barbara Rogers is a 38 y.o. female.  The history is provided by the patient.  She has history of chronic back pain and comes in complaining of pain in the right lower back with some radiation to the right lower abdomen over the last 2-3 days.  Pain is moderately severe and she rates it at 8/10.  She denies any urinary difficulty and denies fever or chills.  There has been no nausea or vomiting.  She denies any recent trauma.  Pain is similar to what she had from a kidney stone many years ago.  She has taken BC powder, Goody powder, tizanidine without any relief.  She is also used a heating pad without relief.  Nothing makes this pain better, nothing makes it worse.  Of note, she did have some hardware removed from her back earlier this year and states that she has been doing generally well since then.  Past Medical History:  Diagnosis Date  . AKI (acute kidney injury) ([place]) 01/18/2015  . Allergy   . Anxiety   . Asthma    exacerbated by bronchitisi  . Asthma   . C3 cervical fracture ([place])   . C3 cervical fracture ([place]) 01/29/2016  . Cervical transverse process fracture ([place])   . Chronic left shoulder pain 08/2012  . Clavicle fracture   . Concussion 01/29/2016  . Depression   . 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 Dysmenorrhea   . Dysphagia   . Dyspnea   . Finger fracture, left 10/31/2012   LEFT 4th finger  . Insomnia   . Lumbar transverse process fracture ([place])   . MDD (major depressive disorder), recurrent episode, severe ([place]) 06/30/2016  . 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 Migraine without aura   . Mood disorder ([place])   . Nephrolithiasis   . OD (overdose of drug), intentional self-harm, initial encounter ([place]) 06/26/2016  . PMDD  (premenstrual dysphoric disorder)   . Pneumonia   . Respiratory failure ([place])   . Seizure-like activity ([place]) 06/30/2016   normal EEG, question pseudoseizures  . T12 burst fracture ([place])   . Traumatic hemo-pneumothorax   . Traumatic hemopneumothorax 02/09/2016    Patient Active Problem List   Diagnosis Date Noted  . Painful orthopaedic hardware ([place]) 04/07/2019  . Multiple falls 12/06/2017  . Short-term memory loss 12/06/2017  . GERD (gastroesophageal reflux disease) 12/06/2017  . Chronic back pain greater than 3 months duration 01/13/2017  . Polysubstance (excluding opioids) dependence, daily use ([place]) 07/01/2016  . Seizure-like activity ([place]) 06/29/2016  . MDD (major depressive disorder), recurrent severe, without psychosis ([place]) 06/27/2016  . Suicidal ideation 06/26/2016  . Bradycardia 06/26/2016  . Tobacco abuse   . Hypokalemia 01/18/2015  . Migraine without aura   . IUD (intrauterine device) in place 06/02/2013  . Hypothyroid 12/09/2012  . Chronic left shoulder pain   . Mood disorder ([place]) 03/25/2012  . AR (allergic rhinitis) 03/25/2012  . Dysmenorrhea 03/25/2012  . PMDD (premenstrual dysphoric disorder) 03/25/2012  . Chronic insomnia 03/25/2012    Past Surgical History:  Procedure Laterality Date  . FRACTURE SURGERY    . HARDWARE REMOVAL N/A 04/07/2019   Procedure: Removal of Thoracolumbar hardware;  Surgeon: Kary Kos, MD;  Location: [facility];  Service: Neurosurgery;  Laterality: N/A;  Removal of Thoracolumbar hardware  . POSTERIOR LUMBAR FUSION 4 LEVEL N/A 01/30/2016   Procedure: T9-L2 Posterior Stabilization, Posterior Lumbar Fusion with Pedicle Screws;  Surgeon: Kary Kos, MD;  Location: [facility];  Service: Neurosurgery;  Laterality: N/A;     OB History    Gravida  3   Para  0   Term  0   Preterm  0   AB  3   Living        SAB  0   TAB  3   Ectopic  0   Multiple      Live Births              Family History  Problem Relation Age of  Onset  . Migraines Mother   . Mental illness Mother   . Heart disease Father   . Alcohol abuse Father   . Arthritis Maternal Grandmother   . Thyroid disease Maternal Grandmother   . Heart disease Maternal Grandfather        AMI 1996, 2014  . Anemia Maternal Grandfather        bone marrow dysfunction    Social History   Tobacco Use  . Smoking status: Current Every Day Smoker    Packs/day: 1.00    Types: Cigarettes  . Smokeless tobacco: Never Used  Substance Use Topics  . Alcohol use: Yes    Comment: occassionally  . Drug use: Yes    Frequency: 1.0 times per week    Types: Marijuana    Comment: Neg UDS    Home Medications Prior to Admission medications   Medication Sig Start Date End Date Taking? Authorizing Provider  albuterol (PROAIR HFA) 108 (90 Base) MCG/ACT inhaler TAKE 2 PUFFS EVERY 4 HOURS AS NEEDED Patient taking differently: Inhale 2 puffs into the lungs every 4 (four) hours as needed for wheezing or shortness of breath. TAKE 2 PUFFS EVERY 4 HOURS AS NEEDED 09/24/18   Rutherford Guys, MD  ALPRAZolam Duanne Moron) 1 MG tablet Take 1 mg by mouth See admin instructions. Up to 6 times daily as needed    [provider]  amphetamine-dextroamphetamine (ADDERALL) 20 MG tablet Take 20 mg by mouth 3 (three) times daily as needed (ADD).     [provider]  azelastine (ASTELIN) 0.1 % nasal spray Place 2 sprays into both nostrils 2 (two) times daily. Use in each nostril as directed Patient taking differently: Place 2 sprays into both nostrils daily. Use in each nostril as directed 12/25/18   Rutherford Guys, MD  AZO-CRANBERRY PO Take 1 tablet by mouth daily.    [provider]  beclomethasone (QVAR) 80 MCG/ACT inhaler Inhale 1 puff into the lungs 2 (two) times daily.    [provider]  buprenorphine-naloxone (SUBOXONE) 8-2 mg SUBL SL tablet Place 1 tablet under the tongue daily.    [provider]  dexlansoprazole (DEXILANT) 60 MG capsule  Take 1 capsule (60 mg total) by mouth daily. 12/06/17   Harrison Mons, PA  divalproex (DEPAKOTE ER) 500 MG 24 hr tablet Take 1,500 mg by mouth at bedtime.     [provider]  gabapentin (NEURONTIN) 800 MG tablet Take 800 mg by mouth 3 (three) times daily.    [provider]  HYDROcodone-acetaminophen (NORCO/VICODIN) 5-325 MG tablet Take 1 tablet by mouth every 6 (six) hours as needed for moderate pain. 04/08/19 04/07/20  Meyran, Ocie Cornfield, NP  ibuprofen (ADVIL,MOTRIN) 800 MG tablet TAKE 1 TABLET  BY MOUTH EVERY 8 HOURS AS NEEDED Patient taking differently: Take 800 mg by mouth every 8 (eight) hours as needed (pain).  10/23/18   Rutherford Guys, MD  levonorgestrel (MIRENA) 20 MCG/24HR IUD 1 each by Intrauterine route once.    [provider]  levothyroxine (SYNTHROID, LEVOTHROID) 50 MCG tablet TAKE 1 TABLET BY MOUTH EVERY DAY 11/04/16   Harrison Mons, PA  lidocaine (LIDODERM) 5 % Place 3 patches onto the skin daily. Remove & Discard patch within 12 hours or as directed by provider Patient taking differently: Place 3 patches onto the skin daily as needed (pain). Remove & Discard patch within 12 hours or as directed by provider 12/06/17   Harrison Mons, PA  methocarbamol (ROBAXIN) 500 MG tablet Take 1 tablet (500 mg total) by mouth 4 (four) times daily. 04/08/19   Meyran, Ocie Cornfield, NP  ondansetron (ZOFRAN) 8 MG tablet Take 8 mg by mouth daily as needed for nausea.  11/11/16   [provider]  oxyCODONE-acetaminophen (PERCOCET) 5-325 MG tablet Take 1 tablet by mouth every 4 (four) hours as needed for severe pain. 04/08/19 04/07/20  Meyran, Ocie Cornfield, NP  promethazine (PHENERGAN) 25 MG tablet Take 1 tablet (25 mg total) by mouth every 8 (eight) hours as needed for nausea or vomiting. 05/08/18   Rutherford Guys, MD  SAPHRIS 10 MG SUBL  09/13/18   [provider]  tiZANidine (ZANAFLEX) 4 MG tablet TAKE 3 TABLETS BY MOUTH 3 TIMES A DAY Patient taking  differently: Take 4-12 mg by mouth 3 (three) times daily as needed for muscle spasms.  10/27/18   Rutherford Guys, MD  traMADol (ULTRAM) 50 MG tablet Take 100 mg by mouth every 6 (six) hours as needed (pain).     [provider]  zolpidem (AMBIEN) 10 MG tablet Take 10 mg by mouth at bedtime.     [provider]    Allergies    Penicillins, Progestins, Progestins, Sinequan [doxepin], and Sinequan [doxepin]  Review of Systems   Review of Systems  All other systems reviewed and are negative.   Physical Exam Updated Vital Signs BP 125/80   Pulse 72   Temp 98.3 F (36.8 C)   Resp 18   Ht 5\' 7"  (1.702 m)   Wt 82.6 kg   SpO2 99%   BMI 28.51 kg/m   Physical Exam Vitals and nursing note reviewed.   38 year old female, resting comfortably and in no acute distress. Vital signs are normal. Oxygen saturation is 99%, which is normal. Head is normocephalic and atraumatic. PERRLA, EOMI. Oropharynx is clear. Neck is nontender and supple without adenopathy or JVD. Back is nontender in the midline.  There is mild right CVA tenderness. Lungs are clear without rales, wheezes, or rhonchi. Chest is nontender. Heart has regular rate and rhythm without murmur. Abdomen is soft, flat, with mild right lower quadrant tenderness.  There is no rebound or guarding.  There are no masses or hepatosplenomegaly and peristalsis is hypoactive. Extremities have no cyanosis or edema, full range of motion is present. Skin is warm and dry without rash. Neurologic: Mental status is normal, cranial nerves are intact, there are no motor or sensory deficits.  ED Results / Procedures / Treatments   Labs (all labs ordered are listed, but only abnormal results are displayed) Labs Reviewed  URINALYSIS, ROUTINE W REFLEX MICROSCOPIC - Abnormal; Notable for the following components:      Result Value   APPearance CLOUDY (*)  Leukocytes,Ua TRACE (*)    Bacteria, UA RARE (*)    All other components  within normal limits  URINE CULTURE  POC URINE PREG, ED    Radiology CT Renal Stone Study  Result Date: 09/07/2019 CLINICAL DATA:  Right flank pain that started a couple of days ago and is worsening. EXAM: CT ABDOMEN AND PELVIS WITHOUT CONTRAST TECHNIQUE: Multidetector CT imaging of the abdomen and pelvis was performed following the standard protocol without IV contrast. COMPARISON:  06/11/2018 FINDINGS: Lower chest:  Mild scarring in the dependent left lung. Hepatobiliary: No focal liver abnormality.No evidence of biliary obstruction or stone. Pancreas: Nonspecific coarse pancreatic calcification. Spleen: Unremarkable. Adrenals/Urinary Tract: Negative adrenals. Two 4 mm right renal calculi with overlying cortical thinning. No hydronephrosis or ureteral stone. Unremarkable bladder. Stomach/Bowel: No obstruction. No appendicitis. Moderate stool mainly in the ascending colon. Vascular/Lymphatic: No acute vascular abnormality. No mass or adenopathy. Reproductive:IUD in place. Eccentric nodular thickening of the right uterine body, likely underlying fibroid which could measure up to 4.2 cm on coronal reformats. Other: No ascites or pneumoperitoneum. Musculoskeletal: Remote and healed T12 fracture with bone defects related to prior fusion hardware. IMPRESSION: 1. No emergent finding. 2. 2 nonobstructive right renal calculi. 3. Probable intramural uterine fibroid. Electronically Signed   By: Monte Fantasia M.D.   On: 09/07/2019 04:55    Procedures Procedures   Medications Ordered in ED Medications  cephALEXin (KEFLEX) capsule 500 mg (has no administration in time range)  ketorolac (TORADOL) 30 MG/ML injection 30 mg (30 mg Intravenous Given 09/07/19 0420)  HYDROmorphone (DILAUDID) injection 1 mg (1 mg Intravenous Given 09/07/19 0542)  ondansetron (ZOFRAN) injection 4 mg (4 mg Intravenous Given 09/07/19 0541)    ED Course  I have reviewed the triage vital signs and the nursing notes.  Pertinent  labs & imaging results that were available during my care of the patient were reviewed by me and considered in my medical decision making (see chart for details).  MDM Rules/Calculators/A&P Right flank pain of uncertain cause.  Consider urinary tract infection, urolithiasis, musculoskeletal back pain, diverticulitis, appendicitis.  Old records were reviewed confirming prior management for back pain with recent removal of hardware.  CT scan in 2014 did show a 4 mm calculus in the right kidney.  She will be given IV ketorolac and will be sent for renal stone protocol CT scan.  She had slight relief with ketorolac.  CT scan shows no acute process, incidental finding of nonobstructing right renal calculus.  Urinalysis is still pending.  Urinalysis is a contaminated specimen but does have 21-50 WBCs per high-power field and 6-10 RBCs per high-power field and rare bacteria.  I doubt this is actually the source of her pain but will start her on antibiotic's.  She is given a prescription for cephalexin and urine has been sent for culture.  She is discharged with instructions to continue her usual pain management at home.  Return precautions discussed.  Final Clinical Impression(s) / ED Diagnoses Final diagnoses:  Right flank pain  Pyuria    Rx / DC Orders ED Discharge Orders         Ordered    cephALEXin (KEFLEX) 500 MG capsule  4 times daily     09/07/19 [id number]           Delora Fuel, MD [id number] [phone]  Patient has requested a prescription for fluconazole to take if she gets a yeast infection.  She is given a prescription for 1 tablet of fluconazole 150  mg and told to take after she has completed the course of cephalexin.   Delora Fuel, MD [id number] [phone]

## 2019-09-07 NOTE — ED Triage Notes (Signed)
Pt c/o right flank pain that started a couple days ago. States that it has gotten worse. Barbara Rogers that she has had a few back surgeries and it doesn't feel like that but has had a kidney stone and thinks that is what it is. Denies any urinary symptoms.

## 2019-09-07 NOTE — Discharge Instructions (Addendum)
Here urine showed you may have a kidney infection, but it is not 100% clear.  You are being given a prescription for an antibiotic and urine has been sent for culture.  Please continue your usual pain management at home.  Follow-up with your primary care provider next week.  Return if symptoms are getting worse.

## 2019-09-08 LAB — URINE CULTURE

## 2019-11-01 ENCOUNTER — Encounter (HOSPITAL_COMMUNITY): Payer: Self-pay | Admitting: Emergency Medicine

## 2019-11-01 ENCOUNTER — Other Ambulatory Visit: Payer: Self-pay

## 2019-11-01 ENCOUNTER — Emergency Department (HOSPITAL_COMMUNITY)
Admission: EM | Admit: 2019-11-01 | Discharge: 2019-11-02 | Disposition: A | Discharge status: Home / Self Care | Payer: BC Managed Care – PPO | Attending: Emergency Medicine | Admitting: Emergency Medicine

## 2019-11-01 DIAGNOSIS — H5711 Ocular pain, right eye: Secondary | ICD-10-CM | POA: Diagnosis not present

## 2019-11-01 DIAGNOSIS — Z79899 Other long term (current) drug therapy: Secondary | ICD-10-CM | POA: Diagnosis not present

## 2019-11-01 DIAGNOSIS — Z975 Presence of (intrauterine) contraceptive device: Secondary | ICD-10-CM | POA: Insufficient documentation

## 2019-11-01 DIAGNOSIS — J45909 Unspecified asthma, uncomplicated: Secondary | ICD-10-CM | POA: Insufficient documentation

## 2019-11-01 DIAGNOSIS — F17210 Nicotine dependence, cigarettes, uncomplicated: Secondary | ICD-10-CM | POA: Diagnosis not present

## 2019-11-01 NOTE — ED Triage Notes (Signed)
Pt C/O bilateral eye sensitivity. Pt was prescribed polymyxin and Tobrex for "pink eye" Monday Feb 15th.

## 2019-11-02 MED ORDER — TOBRAMYCIN-DEXAMETHASONE 0.3-0.1 % OP OINT
TOPICAL_OINTMENT | Freq: Four times a day (QID) | OPHTHALMIC | Status: DC
  Filled 2019-11-02: qty 3.5

## 2019-11-02 MED ORDER — FLUORESCEIN SODIUM 1 MG OP STRP
1.0000 | ORAL_STRIP | Freq: Once | OPHTHALMIC | Status: AC
  Administered 2019-11-02: 1 via OPHTHALMIC
  Filled 2019-11-02: qty 1

## 2019-11-02 MED ORDER — TETRACAINE HCL 0.5 % OP SOLN
2.0000 [drp] | Freq: Once | OPHTHALMIC | Status: AC
  Administered 2019-11-02: 2 [drp] via OPHTHALMIC
  Filled 2019-11-02: qty 4

## 2019-11-02 NOTE — Discharge Instructions (Signed)
Apply 1-2 drops of the medication to your right eye every 6 hours.  Avoid rubbing your eye.  You may apply cool compress on and off as needed.  Call the eye doctor listed to arrange a follow-up appointment for this week

## 2019-11-02 NOTE — ED Provider Notes (Signed)
[facility] EMERGENCY DEPARTMENT Provider Note   CSN: [id number] Arrival date & time: 11/01/19  1910     History Chief Complaint  Patient presents with  . Eye Pain    Barbara Rogers is a 39 y.o. female.  HPI     Barbara Rogers is a 39 y.o. female who presents to the Emergency Department complaining of bilateral eye pain and redness for over 1 week.  Her significant other was diagnosed with "pink eye" and she reports noticing redness, tearing and drainage of her eye one week later.  She had a tele visit and was prescribed polymyxin drops without relief and then switched to tobrex.  She reports difficulty applying the drops, but reports improvement of the left eye, but continues to have blurred vision, redness and tearing of the right eye with crusting and drainage of the eye until 2 days ago.  She reports decreased vision from the right eye secondary to a childhood injury.  Symptoms worsen with exposure to bright lights.  She denies headache, dizziness, facial pain or swelling.  She denies trauma.  She does not wear contacts or prescription glasses.     Past Medical History:  Diagnosis Date  . AKI (acute kidney injury) ([place]) 01/18/2015  . Allergy   . Anxiety   . Asthma    exacerbated by bronchitisi  . Asthma   . C3 cervical fracture ([place])   . C3 cervical fracture ([place]) 01/29/2016  . Cervical transverse process fracture ([place])   . Chronic left shoulder pain 08/2012  . Clavicle fracture   . Concussion 01/29/2016  . Depression   . 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 Dysmenorrhea   . Dysphagia   . Dyspnea   . Finger fracture, left 10/31/2012   LEFT 4th finger  . Insomnia   . Lumbar transverse process fracture ([place])   . MDD (major depressive disorder), recurrent episode, severe ([place]) 06/30/2016  . 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 Migraine without aura   . Mood disorder ([place])   . Nephrolithiasis   . OD (overdose  of drug), intentional self-harm, initial encounter ([place]) 06/26/2016  . PMDD (premenstrual dysphoric disorder)   . Pneumonia   . Respiratory failure ([place])   . Seizure-like activity ([place]) 06/30/2016   normal EEG, question pseudoseizures  . T12 burst fracture ([place])   . Traumatic hemo-pneumothorax   . Traumatic hemopneumothorax 02/09/2016    Patient Active Problem List   Diagnosis Date Noted  . Painful orthopaedic hardware ([place]) 04/07/2019  . Multiple falls 12/06/2017  . Short-term memory loss 12/06/2017  . GERD (gastroesophageal reflux disease) 12/06/2017  . Chronic back pain greater than 3 months duration 01/13/2017  . Polysubstance (excluding opioids) dependence, daily use ([place]) 07/01/2016  . Seizure-like activity ([place]) 06/29/2016  . MDD (major depressive disorder), recurrent severe, without psychosis ([place]) 06/27/2016  . Suicidal ideation 06/26/2016  . Bradycardia 06/26/2016  . Tobacco abuse   . Hypokalemia 01/18/2015  . Migraine without aura   . IUD (intrauterine device) in place 06/02/2013  . Hypothyroid 12/09/2012  . Chronic left shoulder pain   . Mood disorder ([place]) 03/25/2012  . AR (allergic rhinitis) 03/25/2012  . Dysmenorrhea 03/25/2012  . PMDD (premenstrual dysphoric disorder) 03/25/2012  . Chronic insomnia 03/25/2012    Past Surgical History:  Procedure Laterality Date  . FRACTURE SURGERY    . HARDWARE REMOVAL N/A 04/07/2019   Procedure: Removal of Thoracolumbar hardware;  Surgeon: Kary Kos, MD;  Location:  [facility];  Service: Neurosurgery;  Laterality: N/A;  Removal of Thoracolumbar hardware  . POSTERIOR LUMBAR FUSION 4 LEVEL N/A 01/30/2016   Procedure: T9-L2 Posterior Stabilization, Posterior Lumbar Fusion with Pedicle Screws;  Surgeon: Kary Kos, MD;  Location: [facility];  Service: Neurosurgery;  Laterality: N/A;     OB History    Gravida  3   Para  0   Term  0   Preterm  0   AB  3   Living        SAB  0   TAB  3   Ectopic  0   Multiple       Live Births              Family History  Problem Relation Age of Onset  . Migraines Mother   . Mental illness Mother   . Heart disease Father   . Alcohol abuse Father   . Arthritis Maternal Grandmother   . Thyroid disease Maternal Grandmother   . Heart disease Maternal Grandfather        AMI 1996, 2014  . Anemia Maternal Grandfather        bone marrow dysfunction    Social History   Tobacco Use  . Smoking status: Current Every Day Smoker    Packs/day: 1.00    Types: Cigarettes  . Smokeless tobacco: Never Used  Substance Use Topics  . Alcohol use: Yes    Comment: occassionally  . Drug use: Yes    Frequency: 1.0 times per week    Types: Marijuana    Comment: Neg UDS    Home Medications Prior to Admission medications   Medication Sig Start Date End Date Taking? Authorizing Provider  acetaminophen (TYLENOL) 500 MG tablet Take 500 mg by mouth every 6 (six) hours as needed for mild pain or moderate pain.   Yes [provider]  albuterol (PROAIR HFA) 108 (90 Base) MCG/ACT inhaler TAKE 2 PUFFS EVERY 4 HOURS AS NEEDED Patient taking differently: Inhale 2 puffs into the lungs every 4 (four) hours as needed for wheezing or shortness of breath. TAKE 2 PUFFS EVERY 4 HOURS AS NEEDED 09/24/18  Yes Rutherford Guys, MD  ALPRAZolam Duanne Moron) 1 MG tablet Take 1 mg by mouth See admin instructions. Up to 6 times daily as needed for anxiety   Yes [provider]  amphetamine-dextroamphetamine (ADDERALL) 20 MG tablet Take 20 mg by mouth 3 (three) times daily as needed (ADD).    Yes [provider]  azelastine (ASTELIN) 0.1 % nasal spray Place 2 sprays into both nostrils 2 (two) times daily. Use in each nostril as directed Patient taking differently: Place 2 sprays into both nostrils daily. Use in each nostril as directed 12/25/18  Yes Rutherford Guys, MD  Buprenorphine HCl-Naloxone HCl 8-2 MG FILM Place 1.5 Film under the tongue daily. 10/07/19  Yes [provider]  dexlansoprazole (DEXILANT) 60 MG capsule Take 1 capsule (60 mg total) by mouth daily. 12/06/17  Yes Jeffery, [name], PA  divalproex (DEPAKOTE ER) 500 MG 24 hr tablet Take 1,000 mg by mouth at bedtime.    Yes [provider]  gabapentin (NEURONTIN) 800 MG tablet Take 800 mg by mouth 3 (three) times daily.   Yes [provider]  ibuprofen (ADVIL) 200 MG tablet Take 200 mg by mouth every 6 (six) hours as needed for mild pain or moderate pain.   Yes [provider]  levonorgestrel (MIRENA) 20 MCG/24HR IUD 1 each  by Intrauterine route once.   Yes [provider]  lidocaine (LIDODERM) 5 % Place 3 patches onto the skin daily. Remove & Discard patch within 12 hours or as directed by provider Patient taking differently: Place 3 patches onto the skin daily as needed (pain). Remove & Discard patch within 12 hours or as directed by provider 12/06/17  Yes Jeffery, [name], PA  montelukast (SINGULAIR) 10 MG tablet Take 10 mg by mouth at bedtime. 10/07/19  Yes [provider]  ondansetron (ZOFRAN) 8 MG tablet Take 8 mg by mouth daily as needed for nausea.  11/11/16  Yes [provider]  tiZANidine (ZANAFLEX) 4 MG tablet TAKE 3 TABLETS BY MOUTH 3 TIMES A DAY Patient taking differently: Take 12 mg by mouth 3 (three) times daily as needed for muscle spasms.  10/27/18  Yes Rutherford Guys, MD  TOBREX 0.3 % ophthalmic ointment Place 1 application into both eyes every 3 (three) hours. 10/29/19  Yes [provider]  zolpidem (AMBIEN) 10 MG tablet Take 10 mg by mouth at bedtime.    Yes [provider]    Allergies    Penicillins, Progestins, Sinequan [doxepin], and Sinequan [doxepin]  Review of Systems   Review of Systems  Constitutional: Negative for appetite change, chills and fever.  HENT: Negative for congestion.   Eyes: Positive for photophobia, pain, discharge, redness and visual disturbance.  Respiratory: Negative for cough and  shortness of breath.   Gastrointestinal: Negative for nausea and vomiting.  Skin: Negative for rash.  Neurological: Negative for dizziness, facial asymmetry, weakness, light-headedness, numbness and headaches.    Physical Exam Updated Vital Signs BP (!) 142/84 (BP Location: Right Arm)   Pulse 70   Temp 98.6 F (37 C) (Oral)   Resp 15   Ht 5\' 9"  (1.753 m)   Wt 77.1 kg   SpO2 99%   BMI 25.10 kg/m   Physical Exam Vitals and nursing note reviewed.  Constitutional:      General: She is not in acute distress.    Appearance: Normal appearance.  HENT:     Head: Atraumatic.     Mouth/Throat:     Mouth: Mucous membranes are moist.     Pharynx: Oropharynx is clear.  Eyes:     General: Lids are everted, no foreign bodies appreciated. Vision grossly intact. Gaze aligned appropriately.        Right eye: No foreign body, discharge or hordeolum.     Extraocular Movements: Extraocular movements intact.     Conjunctiva/sclera:     Right eye: Right conjunctiva is injected. No chemosis, exudate or hemorrhage.    Pupils: Pupils are equal, round, and reactive to light.     Right eye: Pupil is not sluggish. No corneal abrasion or fluorescein uptake. Seidel exam negative.     Slit lamp exam:    Right eye: Photophobia present. No corneal flare, corneal ulcer, foreign body or hyphema.  Cardiovascular:     Rate and Rhythm: Normal rate and regular rhythm.  Pulmonary:     Effort: Pulmonary effort is normal.  Musculoskeletal:        General: Normal range of motion.     Cervical back: Normal range of motion.  Lymphadenopathy:     Cervical: No cervical adenopathy.  Skin:    General: Skin is warm.     Findings: No rash.  Neurological:     General: No focal deficit present.     Mental Status: She is alert.  Sensory: No sensory deficit.     Motor: No weakness.     Comments: CN II-XII grossly intact     ED Results / Procedures / Treatments   Labs (all labs ordered are listed, but only  abnormal results are displayed) Labs Reviewed - No data to display  EKG None  Radiology No results found.  Procedures Procedures (including critical care time)  Medications Ordered in ED Medications  fluorescein ophthalmic strip 1 strip (1 strip Right Eye Given 11/02/19 0024)  tetracaine (PONTOCAINE) 0.5 % ophthalmic solution 2 drop (2 drops Right Eye Given 11/02/19 0024)    ED Course  I have reviewed the triage vital signs and the nursing notes.  Pertinent labs & imaging results that were available during my care of the patient were reviewed by me and considered in my medical decision making (see chart for details).    MDM Rules/Calculators/A&P                        Visual Acuity  Right Eye Distance: 20/25 Left Eye Distance: 20/20 Bilateral Distance:    IOP measured with tonopen:  Right eye 6 mmHg  Pt initially with redness, drainage of both eyes.  Symptoms of left eye improved, but she continues to have sx's of the right eye for 1 week.  Reports exposure to conjunctivitis, but exam is concerning for possible uveitis. Will tx with tobradex and pt agrees to close ophth f/u in 1-2 days.  Return precautions given.    Final Clinical Impression(s) / ED Diagnoses Final diagnoses:  Pain of right eye    Rx / DC Orders ED Discharge Orders    None       Bufford Lope [id number]    Delora Fuel, MD [id number] [phone]

## 2019-11-02 NOTE — ED Notes (Signed)
Visual acuity test performed, pt exhibited 20/20 vision in left eye and 20/25 in right. Pt says lights make her eyes water.

## 2020-01-10 ENCOUNTER — Emergency Department (HOSPITAL_COMMUNITY): Payer: BC Managed Care – PPO

## 2020-01-10 ENCOUNTER — Encounter (HOSPITAL_COMMUNITY): Payer: Self-pay | Admitting: *Deleted

## 2020-01-10 ENCOUNTER — Emergency Department (HOSPITAL_COMMUNITY)
Admission: EM | Admit: 2020-01-10 | Discharge: 2020-01-10 | Disposition: A | Discharge status: Home / Self Care | Payer: BC Managed Care – PPO | Attending: Emergency Medicine | Admitting: Emergency Medicine

## 2020-01-10 ENCOUNTER — Other Ambulatory Visit: Payer: Self-pay

## 2020-01-10 DIAGNOSIS — N3000 Acute cystitis without hematuria: Secondary | ICD-10-CM | POA: Insufficient documentation

## 2020-01-10 DIAGNOSIS — R109 Unspecified abdominal pain: Secondary | ICD-10-CM | POA: Diagnosis present

## 2020-01-10 DIAGNOSIS — Z79899 Other long term (current) drug therapy: Secondary | ICD-10-CM | POA: Insufficient documentation

## 2020-01-10 DIAGNOSIS — F1210 Cannabis abuse, uncomplicated: Secondary | ICD-10-CM | POA: Insufficient documentation

## 2020-01-10 DIAGNOSIS — F17210 Nicotine dependence, cigarettes, uncomplicated: Secondary | ICD-10-CM | POA: Diagnosis not present

## 2020-01-10 LAB — I-STAT BETA HCG BLOOD, ED (MC, WL, AP ONLY): I-stat hCG, quantitative: <5 m[IU]/mL (ref <5)

## 2020-01-10 LAB — CBC WITH DIFFERENTIAL/PLATELET
Abs Granulocyte: 3.5 10*3/uL (ref 1.5–6.5)
Abs Immature Granulocytes: 0.01 10*3/uL (ref 0.00–0.07)
Basophils Absolute: 0 10*3/uL (ref 0.0–0.1)
Basophils Relative: 2 %
Eosinophils Absolute: 0.2 10*3/uL (ref 0.0–0.5)
Eosinophils Relative: 0 %
HCT: 42.2 % (ref 36.0–46.0)
Hemoglobin: 13.7 g/dL (ref 12.0–15.0)
Immature Granulocytes: 0 %
Lymphocytes Relative: 60 %
Lymphs Abs: 1.8 10*3/uL (ref 0.7–4.0)
MCH: 29.5 pg (ref 26.0–34.0)
MCHC: 32.5 g/dL (ref 30.0–36.0)
MCV: 90.9 fL (ref 80.0–100.0)
Monocytes Absolute: 0.4 10*3/uL (ref 0.1–1.0)
Monocytes Relative: 7 %
Neutro Abs: 3.5 10*3/uL (ref 1.7–7.7)
Neutrophils Relative %: 31 %
Platelets: 217 10*3/uL (ref 150–400)
RBC: 4.64 MIL/uL (ref 3.87–5.11)
RDW: 14.3 % (ref 11.5–15.5)
WBC: 6 10*3/uL (ref 4.0–10.5)
nRBC: 0 % (ref 0.0–0.2)

## 2020-01-10 LAB — URINALYSIS, ROUTINE W REFLEX MICROSCOPIC
Glucose, UA: NEGATIVE mg/dL
Hgb urine dipstick: NEGATIVE
Ketones, ur: NEGATIVE mg/dL
Nitrite: POSITIVE — AB
Protein, ur: 100 mg/dL — AB
Specific Gravity, Urine: 1.03 (ref 1.005–1.030)
WBC, UA: >50 WBC/hpf — ABNORMAL HIGH (ref 0–5)
pH: 5 (ref 5.0–8.0)

## 2020-01-10 LAB — BASIC METABOLIC PANEL
Anion gap: 9 (ref 5–15)
BUN: 14 mg/dL (ref 6–20)
CO2: 29 mmol/L (ref 22–32)
Calcium: 9.4 mg/dL (ref 8.9–10.3)
Chloride: 102 mmol/L (ref 98–111)
Creatinine, Ser: 0.83 mg/dL (ref 0.44–1.00)
GFR calc Af Amer: >60 mL/min (ref >60)
GFR calc non Af Amer: >60 mL/min (ref >60)
Glucose, Bld: 107 mg/dL — ABNORMAL HIGH (ref 70–99)
Potassium: 3.9 mmol/L (ref 3.5–5.1)
Sodium: 140 mmol/L (ref 135–145)

## 2020-01-10 MED ORDER — HYDROCODONE-ACETAMINOPHEN 5-325 MG PO TABS
1.0000 | ORAL_TABLET | Freq: Once | ORAL | Status: AC
  Administered 2020-01-10: 20:00:00 1 via ORAL
  Filled 2020-01-10: qty 1

## 2020-01-10 MED ORDER — CEPHALEXIN 500 MG PO CAPS
500.0000 mg | ORAL_CAPSULE | Freq: Once | ORAL | Status: AC
  Administered 2020-01-10: 500 mg via ORAL
  Filled 2020-01-10: qty 1

## 2020-01-10 MED ORDER — ONDANSETRON 4 MG PO TBDP
4.0000 mg | ORAL_TABLET | Freq: Once | ORAL | Status: AC
  Administered 2020-01-10: 4 mg via ORAL
  Filled 2020-01-10: qty 1

## 2020-01-10 MED ORDER — CEPHALEXIN 500 MG PO CAPS: 500.0000 mg | ORAL_CAPSULE | Freq: Four times a day (QID) | ORAL | 0 refills | Status: AC

## 2020-01-10 MED ORDER — SODIUM CHLORIDE 0.9 % IV BOLUS
1000.0000 mL | Freq: Once | INTRAVENOUS | Status: AC
  Administered 2020-01-10: 20:00:00 1000 mL via INTRAVENOUS

## 2020-01-10 MED ORDER — FLUCONAZOLE 200 MG PO TABS: ORAL_TABLET | ORAL | 0 refills | Status: DC

## 2020-01-10 MED ORDER — CEPHALEXIN 500 MG PO CAPS: 500.0000 mg | ORAL_CAPSULE | Freq: Four times a day (QID) | ORAL | 0 refills | Status: DC

## 2020-01-10 NOTE — ED Provider Notes (Signed)
[facility] EMERGENCY DEPARTMENT Provider Note   CSN: [id number] Arrival date & time: 01/10/20  1809     History Chief Complaint  Patient presents with  . Flank Pain    Barbara Rogers is a 39 y.o. female past medical history of AKI, asthma, depression who presents for evaluation of increased urinary frequency, right flank pain.  She reports that about 2 to 3 days ago, she had noticed some increased urinary frequency.  She did not have any dysuria or hematuria.  She took some over-the-counter Azo pills.  Today, she started having right flank pain.  She states it does not radiate.  She has had some nausea but states that that is not atypical for her.  No vomiting.  She has not noted any fevers.  She took ibuprofen and Tylenol for pain with no improvement in symptoms.  She denies any chest pain, difficulty breathing.  The history is provided by the patient.       Past Medical History:  Diagnosis Date  . AKI (acute kidney injury) ([place]) 01/18/2015  . Allergy   . Anxiety   . Asthma    exacerbated by bronchitisi  . Asthma   . C3 cervical fracture ([place])   . C3 cervical fracture ([place]) 01/29/2016  . Cervical transverse process fracture ([place])   . Chronic left shoulder pain 08/2012  . Clavicle fracture   . Concussion 01/29/2016  . Depression   . 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 Dysmenorrhea   . Dysphagia   . Dyspnea   . Finger fracture, left 10/31/2012   LEFT 4th finger  . Insomnia   . Lumbar transverse process fracture ([place])   . MDD (major depressive disorder), recurrent episode, severe ([place]) 06/30/2016  . 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 Migraine without aura   . Mood disorder ([place])   . Nephrolithiasis   . OD (overdose of drug), intentional self-harm, initial encounter ([place]) 06/26/2016  . PMDD (premenstrual dysphoric disorder)   . Pneumonia   . Respiratory failure ([place])   . Seizure-like activity ([place])  06/30/2016   normal EEG, question pseudoseizures  . T12 burst fracture ([place])   . Traumatic hemo-pneumothorax   . Traumatic hemopneumothorax 02/09/2016    Patient Active Problem List   Diagnosis Date Noted  . Painful orthopaedic hardware ([place]) 04/07/2019  . Multiple falls 12/06/2017  . Short-term memory loss 12/06/2017  . GERD (gastroesophageal reflux disease) 12/06/2017  . Chronic back pain greater than 3 months duration 01/13/2017  . Polysubstance (excluding opioids) dependence, daily use ([place]) 07/01/2016  . Seizure-like activity ([place]) 06/29/2016  . MDD (major depressive disorder), recurrent severe, without psychosis ([place]) 06/27/2016  . Suicidal ideation 06/26/2016  . Bradycardia 06/26/2016  . Tobacco abuse   . Hypokalemia 01/18/2015  . Migraine without aura   . IUD (intrauterine device) in place 06/02/2013  . Hypothyroid 12/09/2012  . Chronic left shoulder pain   . Mood disorder ([place]) 03/25/2012  . AR (allergic rhinitis) 03/25/2012  . Dysmenorrhea 03/25/2012  . PMDD (premenstrual dysphoric disorder) 03/25/2012  . Chronic insomnia 03/25/2012    Past Surgical History:  Procedure Laterality Date  . FRACTURE SURGERY    . HARDWARE REMOVAL N/A 04/07/2019   Procedure: Removal of Thoracolumbar hardware;  Surgeon: Kary Kos, MD;  Location: [facility];  Service: Neurosurgery;  Laterality: N/A;  Removal of Thoracolumbar hardware  . POSTERIOR LUMBAR FUSION 4 LEVEL N/A 01/30/2016   Procedure: T9-L2 Posterior Stabilization, Posterior Lumbar Fusion  with Pedicle Screws;  Surgeon: Kary Kos, MD;  Location: [facility];  Service: Neurosurgery;  Laterality: N/A;     OB History    Gravida  3   Para  0   Term  0   Preterm  0   AB  3   Living        SAB  0   TAB  3   Ectopic  0   Multiple      Live Births              Family History  Problem Relation Age of Onset  . Migraines Mother   . Mental illness Mother   . Heart disease Father   . Alcohol abuse Father   .  Arthritis Maternal Grandmother   . Thyroid disease Maternal Grandmother   . Heart disease Maternal Grandfather        AMI 1996, 2014  . Anemia Maternal Grandfather        bone marrow dysfunction    Social History   Tobacco Use  . Smoking status: Current Every Day Smoker    Packs/day: 1.00    Types: Cigarettes  . Smokeless tobacco: Never Used  Substance Use Topics  . Alcohol use: Yes    Comment: occassionally  . Drug use: Yes    Frequency: 1.0 times per week    Types: Marijuana    Comment: Neg UDS    Home Medications Prior to Admission medications   Medication Sig Start Date End Date Taking? Authorizing Provider  acetaminophen (TYLENOL) 500 MG tablet Take 500 mg by mouth every 6 (six) hours as needed for mild pain or moderate pain.    [provider]  albuterol (PROAIR HFA) 108 (90 Base) MCG/ACT inhaler TAKE 2 PUFFS EVERY 4 HOURS AS NEEDED Patient taking differently: Inhale 2 puffs into the lungs every 4 (four) hours as needed for wheezing or shortness of breath. TAKE 2 PUFFS EVERY 4 HOURS AS NEEDED 09/24/18   Rutherford Guys, MD  ALPRAZolam Duanne Moron) 1 MG tablet Take 1 mg by mouth See admin instructions. Up to 6 times daily as needed for anxiety    [provider]  amphetamine-dextroamphetamine (ADDERALL) 20 MG tablet Take 20 mg by mouth 3 (three) times daily as needed (ADD).     [provider]  azelastine (ASTELIN) 0.1 % nasal spray Place 2 sprays into both nostrils 2 (two) times daily. Use in each nostril as directed Patient taking differently: Place 2 sprays into both nostrils daily. Use in each nostril as directed 12/25/18   Rutherford Guys, MD  Buprenorphine HCl-Naloxone HCl 8-2 MG FILM Place 1.5 Film under the tongue daily. 10/07/19   [provider]  cephALEXin (KEFLEX) 500 MG capsule Take 1 capsule (500 mg total) by mouth 4 (four) times daily for 7 days. 01/10/20 01/17/20  Volanda Napoleon, PA-C  dexlansoprazole (DEXILANT) 60 MG capsule Take  1 capsule (60 mg total) by mouth daily. 12/06/17   Harrison Mons, PA  divalproex (DEPAKOTE ER) 500 MG 24 hr tablet Take 1,000 mg by mouth at bedtime.     [provider]  fluconazole (DIFLUCAN) 200 MG tablet Take one tablet after you finished antibiotics. 01/10/20   Volanda Napoleon, PA-C  gabapentin (NEURONTIN) 800 MG tablet Take 800 mg by mouth 3 (three) times daily.    [provider]  ibuprofen (ADVIL) 200 MG tablet Take 200 mg by mouth every 6 (six) hours as needed for mild pain  or moderate pain.    [provider]  levonorgestrel (MIRENA) 20 MCG/24HR IUD 1 each by Intrauterine route once.    [provider]  lidocaine (LIDODERM) 5 % Place 3 patches onto the skin daily. Remove & Discard patch within 12 hours or as directed by provider Patient taking differently: Place 3 patches onto the skin daily as needed (pain). Remove & Discard patch within 12 hours or as directed by provider 12/06/17   Harrison Mons, PA  montelukast (SINGULAIR) 10 MG tablet Take 10 mg by mouth at bedtime. 10/07/19   [provider]  ondansetron (ZOFRAN) 8 MG tablet Take 8 mg by mouth daily as needed for nausea.  11/11/16   [provider]  tiZANidine (ZANAFLEX) 4 MG tablet TAKE 3 TABLETS BY MOUTH 3 TIMES A DAY Patient taking differently: Take 12 mg by mouth 3 (three) times daily as needed for muscle spasms.  10/27/18   Rutherford Guys, MD  TOBREX 0.3 % ophthalmic ointment Place 1 application into both eyes every 3 (three) hours. 10/29/19   [provider]  zolpidem (AMBIEN) 10 MG tablet Take 10 mg by mouth at bedtime.     [provider]    Allergies    Penicillins, Progestins, Sinequan [doxepin], and Sinequan [doxepin]  Review of Systems   Review of Systems  Constitutional: Negative for fever.  Respiratory: Negative for cough and shortness of breath.   Cardiovascular: Negative for chest pain.  Gastrointestinal: Positive for nausea. Negative for  abdominal pain and vomiting.  Genitourinary: Positive for flank pain and frequency. Negative for dysuria and hematuria.  Neurological: Negative for headaches.  All other systems reviewed and are negative.   Physical Exam Updated Vital Signs BP 117/72 (BP Location: Right Arm)   Pulse 90   Temp 97.6 F (36.4 C) (Oral)   Resp 18   Ht 5\' 8"  (1.727 m)   Wt 82.1 kg   SpO2 96%   BMI 27.52 kg/m   Physical Exam Vitals and nursing note reviewed.  Constitutional:      Appearance: Normal appearance. She is well-developed.     Comments: Sitting comfortably on examination table  HENT:     Head: Normocephalic and atraumatic.  Eyes:     General: Lids are normal.     Conjunctiva/sclera: Conjunctivae normal.     Pupils: Pupils are equal, round, and reactive to light.  Cardiovascular:     Rate and Rhythm: Normal rate and regular rhythm.     Pulses: Normal pulses.     Heart sounds: Normal heart sounds. No murmur. No friction rub. No gallop.   Pulmonary:     Effort: Pulmonary effort is normal.     Breath sounds: Normal breath sounds.  Abdominal:     Palpations: Abdomen is soft. Abdomen is not rigid.     Tenderness: There is no abdominal tenderness. There is right CVA tenderness. There is no left CVA tenderness or guarding.     Comments: Abdomen is soft, non-distended, non-tender. No rigidity, No guarding. No peritoneal signs.  No rigidity, guarding.  Right-sided CVA tenderness present.  No left-sided CVA tenderness.  Musculoskeletal:        General: Normal range of motion.     Cervical back: Full passive range of motion without pain.  Skin:    General: Skin is warm and dry.     Capillary Refill: Capillary refill takes less than 2 seconds.  Neurological:     Mental Status: She is alert and oriented  to person, place, and time.  Psychiatric:        Speech: Speech normal.     ED Results / Procedures / Treatments   Labs (all labs ordered are listed, but only abnormal results are  displayed) Labs Reviewed  URINALYSIS, ROUTINE W REFLEX MICROSCOPIC - Abnormal; Notable for the following components:      Result Value   Color, Urine AMBER (*)    APPearance CLOUDY (*)    Bilirubin Urine SMALL (*)    Protein, ur 100 (*)    Nitrite POSITIVE (*)    Leukocytes,Ua MODERATE (*)    WBC, UA >50 (*)    Bacteria, UA FEW (*)    Non Squamous Epithelial 0-5 (*)    All other components within normal limits  BASIC METABOLIC PANEL - Abnormal; Notable for the following components:   Glucose, Bld 107 (*)    All other components within normal limits  URINE CULTURE  CBC WITH DIFFERENTIAL/PLATELET  I-STAT BETA HCG BLOOD, ED ([facility], [facility], [facility] ONLY)    EKG None  Radiology CT Renal Stone Study  Result Date: 01/10/2020 CLINICAL DATA:  Right flank pain EXAM: CT ABDOMEN AND PELVIS WITHOUT CONTRAST TECHNIQUE: Multidetector CT imaging of the abdomen and pelvis was performed following the standard protocol without IV contrast. COMPARISON:  09/07/2019 FINDINGS: Lower chest: Lung bases are clear. No effusions. Heart is normal size. Hepatobiliary: No focal hepatic abnormality. Gallbladder unremarkable. Pancreas: No focal abnormality or ductal dilatation. Spleen: No focal abnormality.  Normal size. Adrenals/Urinary Tract: Small amount of gas within the urinary bladder, presumably from recent catheterization. No hydronephrosis. Parenchymal versus caliceal calcification in the upper pole of the right kidney measures 3 mm. There is an area of scarring in this region. No adrenal abnormality. Stomach/Bowel: Normal appendix. Large stool burden throughout the colon. Stomach, large and small bowel grossly unremarkable. Vascular/Lymphatic: No evidence of aneurysm or adenopathy. Reproductive: IUD noted in place. Uterine fullness to the right of the IUD likely reflects right uterine fibroid, stable since prior study. No adnexal mass. Other: No free fluid or free air. Musculoskeletal: No acute bony abnormality. IMPRESSION:  No ureteral stones or hydronephrosis. Large stool burden throughout the colon. Normal appendix. Probable fibroid uterus. Electronically Signed   By: Rolm Baptise M.D.   On: 01/10/2020 21:43    Procedures Procedures (including critical care time)  Medications Ordered in ED Medications  cephALEXin (KEFLEX) capsule 500 mg (has no administration in time range)  HYDROcodone-acetaminophen (NORCO/VICODIN) 5-325 MG per tablet 1 tablet (1 tablet Oral Given 01/10/20 1951)  ondansetron (ZOFRAN-ODT) disintegrating tablet 4 mg (4 mg Oral Given 01/10/20 1951)  sodium chloride 0.9 % bolus 1,000 mL (1,000 mLs Intravenous New Bag/Given 01/10/20 2016)    ED Course  I have reviewed the triage vital signs and the nursing notes.  Pertinent labs & imaging results that were available during my care of the patient were reviewed by me and considered in my medical decision making (see chart for details).    MDM Rules/Calculators/A&P                      39 year old female who presents for evaluation of right flank pain that began this morning.  Also endorses urinary frequency over the last few days.  No fevers.  On initial arrival, she is afebrile, nontoxic-appearing.  Vital signs are stable.  On exam, she has right-sided CVA tenderness.  No abdominal tenderness.  Concern for kidney stone versus pyelonephritis versus UTI.  History/physical  exam is not concerning for appendicitis, PID, ovarian torsion.  Plan to check labs, urine.  BMP shows normal BUN and creatinine.  CBC shows no leukocytosis or anemia.  UA is positive nitrates, leukocytes with pyuria noted.  There is squamous epithelium so question contaminant but given that she is symptomatic, will plan to treat.  Will get CT renal study for evaluation of kidney stone.  CT scan shows no ureteral stones or hydronephrosis.  She has large stool burden noted throughout the colon.  Normal appendix.  We will plan to treat as UTI/pyelonephritis.  Patient with history of  allergy to penicillins but review of her records show that she has tolerated Keflex in the past with no difficulty.  Discussed results with patient.  Patient has tolerated p.o. in the department any difficulty.  She is sitting comfortably on bed.  Patient stable for discharge at this time.  Instructed patient take antibiotics as directed.  She is requesting Diflucan pill. At this time, patient exhibits no emergent life-threatening condition that require further evaluation in ED or admission. Patient had ample opportunity for questions and discussion. All patient's questions were answered with full understanding. Strict return precautions discussed. Patient expresses understanding and agreement to plan.   Portions of this note were generated with Lobbyist. Dictation errors may occur despite best attempts at proofreading.  Final Clinical Impression(s) / ED Diagnoses Final diagnoses:  Acute cystitis without hematuria    Rx / DC Orders ED Discharge Orders         Ordered    cephALEXin (KEFLEX) 500 MG capsule  4 times daily,   Status:  Discontinued     01/10/20 2201    fluconazole (DIFLUCAN) 200 MG tablet     01/10/20 2201    cephALEXin (KEFLEX) 500 MG capsule  4 times daily     01/10/20 2201           Desma Mcgregor 01/10/20 2206    Truddie Hidden, MD 01/10/20 2214

## 2020-01-10 NOTE — ED Triage Notes (Signed)
Pt states she woke up this am with severe right side flank pain; pt has a hx of kidney stones; pt states she feels the urge to urinate but is unable to urinate

## 2020-01-10 NOTE — Discharge Instructions (Signed)
  Take antibiotics as directed. Please take all of your antibiotics until finished.  Follow-up with [facility] to establish a primary care doctor if you do not have one.   Return the emergency department for any abdominal pain that worsens, fever, persistent vomiting or any other worsening concerning symptoms.

## 2020-01-13 LAB — URINE CULTURE: Culture: >=100000 — AB

## 2020-01-14 ENCOUNTER — Telehealth: Payer: Self-pay

## 2020-01-14 NOTE — Telephone Encounter (Signed)
Post ED Visit - Positive Culture Follow-up  Culture report reviewed by antimicrobial stewardship pharmacist: [facility] Team []  Elenor Quinones, Pharm.D. []  Heide Guile, Pharm.D., BCPS AQ-ID []  Parks Neptune, Pharm.D., BCPS []  Alycia Rossetti, Pharm.D., BCPS []  [place], Pharm.D., BCPS, AAHIVP []  Legrand Como, Pharm.D., BCPS, AAHIVP []  Salome Arnt, PharmD, BCPS []  Johnnette Gourd, PharmD, BCPS []  Hughes Better, PharmD, BCPS []  Leeroy Cha, PharmD []  Laqueta Linden, PharmD, BCPS []  Albertina Parr, PharmD [place] Team []  Leodis Sias, PharmD []  Lindell Spar, PharmD []  Royetta Asal, PharmD []  Graylin Shiver, Rph []  Rema Fendt) Glennon Mac, PharmD []  Arlyn Dunning, PharmD []  Netta Cedars, PharmD []  Dia Sitter, PharmD []  Leone Haven, PharmD []  Gretta Arab, PharmD []  Theodis Shove, PharmD []  Peggyann Juba, PharmD []  Reuel Boom, PharmD   Positive urine culture Treated with Cephalexin, organism sensitive to the same and no further patient follow-up is required at this time.  Genia Del 01/14/2020, 10:01 AM

## 2020-04-22 ENCOUNTER — Other Ambulatory Visit: Payer: Self-pay

## 2020-04-22 ENCOUNTER — Encounter (HOSPITAL_COMMUNITY): Payer: Self-pay | Admitting: Emergency Medicine

## 2020-04-22 ENCOUNTER — Emergency Department (HOSPITAL_COMMUNITY)
Admission: EM | Admit: 2020-04-22 | Discharge: 2020-04-22 | Disposition: A | Discharge status: Home / Self Care | Payer: BC Managed Care – PPO | Attending: Emergency Medicine | Admitting: Emergency Medicine

## 2020-04-22 DIAGNOSIS — Y939 Activity, unspecified: Secondary | ICD-10-CM | POA: Insufficient documentation

## 2020-04-22 DIAGNOSIS — M79604 Pain in right leg: Secondary | ICD-10-CM | POA: Insufficient documentation

## 2020-04-22 DIAGNOSIS — M545 Low back pain: Secondary | ICD-10-CM | POA: Diagnosis present

## 2020-04-22 DIAGNOSIS — Z5321 Procedure and treatment not carried out due to patient leaving prior to being seen by health care provider: Secondary | ICD-10-CM | POA: Diagnosis not present

## 2020-04-22 DIAGNOSIS — Y999 Unspecified external cause status: Secondary | ICD-10-CM | POA: Diagnosis not present

## 2020-04-22 DIAGNOSIS — Y929 Unspecified place or not applicable: Secondary | ICD-10-CM | POA: Diagnosis not present

## 2020-04-22 DIAGNOSIS — X509XXA Other and unspecified overexertion or strenuous movements or postures, initial encounter: Secondary | ICD-10-CM | POA: Insufficient documentation

## 2020-04-22 NOTE — ED Notes (Signed)
Pt called for vitals x1

## 2020-04-22 NOTE — ED Notes (Signed)
PT called for reassessment x3 with no response. Not visualized in waiting area.

## 2020-04-22 NOTE — ED Notes (Addendum)
Pt called for reassessment x2

## 2020-04-22 NOTE — ED Triage Notes (Signed)
Pt reports lower back pain since reaching in the fridge yesterday to get a drink.  States she felt a pop and pain radiates down R leg.  Also concerned her IUD may have broke.  Reports intermittent vaginal bleeding and abnormal periods.
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
## 2020-05-03 ENCOUNTER — Encounter: Payer: Self-pay | Admitting: Obstetrics and Gynecology

## 2020-05-03 ENCOUNTER — Ambulatory Visit (INDEPENDENT_AMBULATORY_CARE_PROVIDER_SITE_OTHER): Payer: BC Managed Care – PPO | Admitting: Obstetrics and Gynecology

## 2020-05-03 ENCOUNTER — Other Ambulatory Visit (HOSPITAL_COMMUNITY)
Admission: RE | Admit: 2020-05-03 | Discharge: 2020-05-03 | Disposition: A | Discharge status: Home / Self Care | Payer: BC Managed Care – PPO | Source: Ambulatory Visit | Attending: Obstetrics and Gynecology | Admitting: Obstetrics and Gynecology

## 2020-05-03 VITALS — BP 150/95 | HR 86 | Ht 67.0 in | Wt 178.6 lb

## 2020-05-03 DIAGNOSIS — R102 Pelvic and perineal pain: Secondary | ICD-10-CM | POA: Insufficient documentation

## 2020-05-03 MED ORDER — LEUPROLIDE ACETATE 3.75 MG IM KIT: 3.7500 mg | PACK | INTRAMUSCULAR | 2 refills | Status: DC

## 2020-05-03 NOTE — Patient Instructions (Signed)
Leuprolide injection What is this medicine? LEUPROLIDE (loo PROE lide) is a man-made hormone. It is used to treat the symptoms of prostate cancer. This medicine may also be used to treat children with early onset of puberty. It may be used for other hormonal conditions. This medicine may be used for other purposes; ask your health care provider or pharmacist if you have questions. COMMON BRAND NAME(S): Lupron What should I tell my health care provider before I take this medicine? They need to know if you have any of these conditions:  diabetes  heart disease or previous heart attack  high blood pressure  high cholesterol  pain or difficulty passing urine  spinal cord metastasis  stroke  tobacco smoker  an unusual or allergic reaction to leuprolide, benzyl alcohol, other medicines, foods, dyes, or preservatives  pregnant or trying to get pregnant  breast-feeding How should I use this medicine? This medicine is for injection under the skin or into a muscle. You will be taught how to prepare and give this medicine. Use exactly as directed. Take your medicine at regular intervals. Do not take your medicine more often than directed. It is important that you put your used needles and syringes in a special sharps container. Do not put them in a trash can. If you do not have a sharps container, call your pharmacist or healthcare provider to get one. A special MedGuide will be given to you by the pharmacist with each prescription and refill. Be sure to read this information carefully each time. Talk to your pediatrician regarding the use of this medicine in children. While this medicine may be prescribed for children as young as 8 years for selected conditions, precautions do apply. Overdosage: If you think you have taken too much of this medicine contact a poison control center or emergency room at once. NOTE: This medicine is only for you. Do not share this medicine with others. What if  I miss a dose? If you miss a dose, take it as soon as you can. If it is almost time for your next dose, take only that dose. Do not take double or extra doses. What may interact with this medicine? Do not take this medicine with any of the following medications:  chasteberry This medicine may also interact with the following medications:  herbal or dietary supplements, like black cohosh or DHEA  female hormones, like estrogens or progestins and birth control pills, patches, rings, or injections  female hormones, like testosterone This list may not describe all possible interactions. Give your health care provider a list of all the medicines, herbs, non-prescription drugs, or dietary supplements you use. Also tell them if you smoke, drink alcohol, or use illegal drugs. Some items may interact with your medicine. What should I watch for while using this medicine? Visit your doctor or health care professional for regular checks on your progress. During the first week, your symptoms may get worse, but then will improve as you continue your treatment. You may get hot flashes, increased bone pain, increased difficulty passing urine, or an aggravation of nerve symptoms. Discuss these effects with your doctor or health care professional, some of them may improve with continued use of this medicine. Female patients may experience a menstrual cycle or spotting during the first 2 months of therapy with this medicine. If this continues, contact your doctor or health care professional. This medicine may increase blood sugar. Ask your healthcare provider if changes in diet or medicines are needed if   you have diabetes. What side effects may I notice from receiving this medicine? Side effects that you should report to your doctor or health care professional as soon as possible:  allergic reactions like skin rash, itching or hives, swelling of the face, lips, or tongue  breathing problems  chest  pain  depression or memory disorders  pain in your legs or groin  pain at site where injected  severe headache  signs and symptoms of high blood sugar such as being more thirsty or hungry or having to urinate more than normal. You may also feel very tired or have blurry vision  swelling of the feet and legs  visual changes  vomiting Side effects that usually do not require medical attention (report to your doctor or health care professional if they continue or are bothersome):  breast swelling or tenderness  decrease in sex drive or performance  diarrhea  hot flashes  loss of appetite  muscle, joint, or bone pains  nausea  redness or irritation at site where injected  skin problems or acne This list may not describe all possible side effects. Call your doctor for medical advice about side effects. You may report side effects to FDA at 1-800-FDA-1088. Where should I keep my medicine? Keep out of the reach of children. Store below 25 degrees C (77 degrees F). Do not freeze. Protect from light. Do not use if it is not clear or if there are particles present. Throw away any unused medicine after the expiration date. NOTE: This sheet is a summary. It may not cover all possible information. If you have questions about this medicine, talk to your doctor, pharmacist, or health care provider.  2020 Elsevier/Gold Standard (2018-06-25 09:52:48)  

## 2020-05-03 NOTE — Addendum Note (Signed)
Addended by: Jonnie Kind on: 05/03/2020 09:06 PM   Modules accepted: Orders

## 2020-05-04 LAB — CERVICOVAGINAL ANCILLARY ONLY
Chlamydia: NEGATIVE
Comment: NEGATIVE
Comment: NORMAL
Neisseria Gonorrhea: NEGATIVE

## 2020-05-09 ENCOUNTER — Telehealth: Payer: Self-pay | Admitting: *Deleted

## 2020-05-09 NOTE — Telephone Encounter (Signed)
Received note from [place]. Lupron 3.75 mg has been approved 05/08/20-08/08/20. Pt aware of approval. JSY

## 2020-05-16 ENCOUNTER — Telehealth: Payer: Self-pay | Admitting: *Deleted

## 2020-05-16 ENCOUNTER — Telehealth: Payer: Self-pay | Admitting: Obstetrics and Gynecology

## 2020-05-16 NOTE — Telephone Encounter (Signed)
PT called and LMOM she needed to talk to a nurse in ref to a script that she needs to have directions changed to 3 month supply.

## 2020-05-16 NOTE — Telephone Encounter (Signed)
LMOVM returning patient's call.  

## 2020-05-17 ENCOUNTER — Telehealth: Payer: Self-pay | Admitting: Obstetrics & Gynecology

## 2020-05-17 NOTE — Telephone Encounter (Signed)
Patient states she gets lupron cheaper if its ordered for 3 months at a time instead of monthly.

## 2020-05-18 NOTE — Telephone Encounter (Signed)
I'm a little confused on the choice of lupron in this patient so you will need to check with DR Glo Herring regarding his mangement  Thanks  Dr Elonda Husky

## 2020-05-29 ENCOUNTER — Ambulatory Visit: Payer: BC Managed Care – PPO | Admitting: Obstetrics and Gynecology

## 2020-07-03 ENCOUNTER — Ambulatory Visit: Payer: BC Managed Care – PPO | Admitting: Obstetrics & Gynecology

## 2020-07-13 ENCOUNTER — Telehealth: Payer: Self-pay

## 2020-07-13 NOTE — Telephone Encounter (Signed)
Left message @ 2:34 pm. JSY
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
## 2020-07-13 NOTE — Telephone Encounter (Signed)
Pt was wanting to give herself a Lupron injection. 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 Pt was advised she would have to come here for injection. Call transferred to [facility] to find out cost of a nurse visit. [place]

## 2020-10-27 ENCOUNTER — Other Ambulatory Visit: Payer: Self-pay

## 2020-10-27 ENCOUNTER — Encounter: Payer: Self-pay | Admitting: Obstetrics & Gynecology

## 2020-10-27 ENCOUNTER — Ambulatory Visit: Payer: BC Managed Care – PPO | Admitting: Obstetrics & Gynecology

## 2020-10-27 VITALS — BP 107/73 | HR 85 | Ht 69.0 in | Wt 195.0 lb

## 2020-10-27 DIAGNOSIS — N946 Dysmenorrhea, unspecified: Secondary | ICD-10-CM | POA: Diagnosis not present

## 2020-10-27 DIAGNOSIS — D259 Leiomyoma of uterus, unspecified: Secondary | ICD-10-CM | POA: Diagnosis not present

## 2020-10-27 DIAGNOSIS — N939 Abnormal uterine and vaginal bleeding, unspecified: Secondary | ICD-10-CM

## 2020-10-27 NOTE — Progress Notes (Signed)
Follow up appointment for results  Chief Complaint  Patient presents with  . discuss hysterectomy   40yo female who presents to discuss AUB.  Previously tried several types of hormonal options included OCPs, progesterone only pills and Mirena with minimal success. Paragard in place Last lupron 10/06/20 Uterus enlarged 14 weeks size on exam with palpable fibroid   Blood pressure 107/73, pulse 85, height 5\' 9"  (1.753 m), weight 195 lb (88.5 kg).   MEDS ordered this encounter: No orders of the defined types were placed in this encounter.   Orders for this encounter: No orders of the defined types were placed in this encounter.   Impression:   ICD-10-CM   1. Abnormal uterine bleeding  N93.9   2. Uterine leiomyoma, unspecified location  D25.9   3. Dysmenorrhea  N94.6       Plan: Reviewed concerns regarding surgical management of AUB specifically hysterectomy.  Discussed pros/cons of ovarian preservation.   Reviewed salpingectomy to lower ovarian Ca risk Addressed her concerns regarding PMDD and medical management if needed postoperatively   Follow Up: Return in about 9 weeks (around 12/29/2020) for [place], with Dr Elonda Husky.       Face to face time:  20 minutes  Greater than 50% of the visit time was spent in counseling and coordination of care with the patient.  The summary and outline of the counseling and care coordination is summarized in the note above.   All questions were answered.  Past Medical History:  Diagnosis Date  . AKI (acute kidney injury) ([place]) 01/18/2015  . Allergy   . Anxiety   . Asthma    exacerbated by bronchitisi  . Asthma   . C3 cervical fracture ([place])   . C3 cervical fracture ([place]) 01/29/2016  . Cervical transverse process fracture ([place])   . Chronic left shoulder pain 08/2012  . Clavicle fracture   . Concussion 01/29/2016  . Depression   . 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 Dysmenorrhea   . Dysphagia   . Dyspnea   .  Finger fracture, left 10/31/2012   LEFT 4th finger  . Insomnia   . Lumbar transverse process fracture ([place])   . MDD (major depressive disorder), recurrent episode, severe ([place]) 06/30/2016  . 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 Migraine without aura   . Mood disorder ([place])   . Nephrolithiasis   . OD (overdose of drug), intentional self-harm, initial encounter ([place]) 06/26/2016  . PMDD (premenstrual dysphoric disorder)   . Pneumonia   . Respiratory failure ([place])   . Seizure-like activity ([place]) 06/30/2016   normal EEG, question pseudoseizures  . T12 burst fracture ([place])   . Traumatic hemo-pneumothorax   . Traumatic hemopneumothorax 02/09/2016    Past Surgical History:  Procedure Laterality Date  . FRACTURE SURGERY    . HARDWARE REMOVAL N/A 04/07/2019   Procedure: Removal of Thoracolumbar hardware;  Surgeon: Kary Kos, MD;  Location: [facility];  Service: Neurosurgery;  Laterality: N/A;  Removal of Thoracolumbar hardware  . POSTERIOR LUMBAR FUSION 4 LEVEL N/A 01/30/2016   Procedure: T9-L2 Posterior Stabilization, Posterior Lumbar Fusion with Pedicle Screws;  Surgeon: Kary Kos, MD;  Location: [facility];  Service: Neurosurgery;  Laterality: N/A;    OB History    Gravida  3   Para  0   Term  0   Preterm  0   AB  3   Living        SAB  0   IAB  3   Ectopic  0   Multiple      Live Births              Allergies  Allergen Reactions  . Penicillins Other (See Comments)    Unknown Has patient had a PCN reaction causing immediate rash, facial/tongue/throat swelling, SOB or lightheadedness with hypotension:  unknown Has patient had a PCN reaction causing severe rash involving mucus membranes or skin necrosis:  unknown Has patient had a PCN reaction that required hospitalization  unknown Has patient had a PCN reaction occurring within the last 10 years: unknown If all of the above answers are "NO", the  . Progestins      Extreme moodiness  . Sinequan [Doxepin] Other (See Comments) and Hypertension    Hallucinations, delusions, severe agitation  . Sinequan [Doxepin] Other (See Comments)    hallucinations    Social History   Socioeconomic History  . Marital status: Single    Spouse name: N/A  . Number of children: 0  . Years of education: 50  . Highest education level: Not on file  Occupational History  . Occupation: [employer]    Employer: [employer]    Comment: [employer]  . Occupation: Currently on disability  Tobacco Use  . Smoking status: Current Every Day Smoker    Packs/day: 1.00    Types: Cigarettes  . Smokeless tobacco: Never Used  Vaping Use  . Vaping Use: Never used  Substance and Sexual Activity  . Alcohol use: Yes    Comment: occassionally  . Drug use: Yes    Frequency: 1.0 times per week    Types: Marijuana    Comment: Neg UDS  . Sexual activity: Yes    Partners: Male    Birth control/protection: I.U.D.  Other Topics Concern  . Not on file  Social History Narrative   ** Merged History Encounter **       Lived with her boyfriend/fiance for several years, Gaspar Bidding. They split up in spring/summer 2018.   Lives alone.   Both parents deceased during her childhood.   She found her mother's body following her suicide, when she was 40 years old.   Raised by her grandparents, also now deceased.   Social Determinants of Health   Financial Resource Strain: Low Risk   . Difficulty of Paying Living Expenses: Not very hard  Food Insecurity: No Food Insecurity  . Worried About Charity fundraiser in the Last Year: Never true  . Ran Out of Food in the Last Year: Never true  Transportation Needs: Unmet Transportation Needs  . Lack of Transportation (Medical): Yes  . Lack of Transportation (Non-Medical): Yes  Physical Activity: Insufficiently Active  . Days of Exercise per Week: 6 days  . Minutes of Exercise per Session: 10 min  Stress: Stress Concern Present  . Feeling of Stress : Very much   Social Connections: Moderately Integrated  . Frequency of Communication with Friends and Family: More than three times a week  . Frequency of Social Gatherings with Friends and Family: More than three times a week  . Attends Religious Services: Never  . Active Member of Clubs or Organizations: No  . Attends Archivist Meetings: 1 to 4 times per year  . Marital Status: Living with partner    Family History  Problem Relation Age of Onset  . Migraines Mother   . Mental illness Mother   . Heart disease Father   . Alcohol abuse  Father   . Arthritis Maternal Grandmother   . Thyroid disease Maternal Grandmother   . Heart disease Maternal Grandfather        AMI 1996, 2014  . Anemia Maternal Grandfather        bone marrow dysfunction

## 2020-12-18 ENCOUNTER — Other Ambulatory Visit (HOSPITAL_COMMUNITY): Payer: BC Managed Care – PPO

## 2020-12-25 ENCOUNTER — Other Ambulatory Visit (HOSPITAL_COMMUNITY): Payer: BC Managed Care – PPO

## 2020-12-29 ENCOUNTER — Encounter: Payer: BC Managed Care – PPO | Admitting: Obstetrics & Gynecology

## 2021-01-04 ENCOUNTER — Encounter: Payer: BC Managed Care – PPO | Admitting: Obstetrics & Gynecology

## 2021-01-19 ENCOUNTER — Telehealth: Payer: Self-pay | Admitting: Obstetrics & Gynecology

## 2021-01-19 NOTE — Telephone Encounter (Signed)
Yes it is ok

## 2021-01-19 NOTE — Telephone Encounter (Signed)
Work note given for 5/12-5/14/22. Pt to return on 01/21/21. Note sent to pt's MyChart. Pt aware. [place]

## 2021-01-19 NOTE — Telephone Encounter (Signed)
Pt having a heavy, painful period (recently stopped Depo shot & scheduled for surgery 02/14/2021)   Requesting a note to be out of work 01/18/2021 to return to work 01/21/2021  Pt does a lot of heavy lifting at [employer]    Please advise & notify patient

## 2021-01-23 ENCOUNTER — Encounter: Payer: Self-pay | Admitting: Obstetrics & Gynecology

## 2021-01-23 ENCOUNTER — Telehealth: Payer: Self-pay | Admitting: Obstetrics & Gynecology

## 2021-01-23 NOTE — Telephone Encounter (Signed)
The letter is available in [place]

## 2021-01-23 NOTE — Telephone Encounter (Signed)
Patient wants to know if she can have a doctor's note to excuse her out of work for today..Patient stated that she's having heavy bleeding, cramps and constipation ,per patient. Clinical staff will follow up with patient.

## 2021-01-23 NOTE — Telephone Encounter (Signed)
Called pt to inform her that Dr Elonda Husky gave her a note for work. No answer, left vm.

## 2021-02-07 ENCOUNTER — Telehealth: Payer: Self-pay | Admitting: Obstetrics & Gynecology

## 2021-02-07 NOTE — Telephone Encounter (Signed)
Pt is having surgery 02/14/21 and is wanting to go ahead & start her leave. States she is having abdominal cramping, bloating, constipation and bleeding since coming off the Depo and is unable to work. Last day worked 01/31/2021 (her schedule is Sunday-Thursday).  Informed I would discuss with Dr Elonda Husky when he returns but would need STD forms as I had not received them as of today.  Patient stated they were in the mail and should arrive sometime this week.

## 2021-02-07 NOTE — Telephone Encounter (Signed)
Pt is having surgery 02/14/21 - would like to go ahead & start her leave (short term disability) States she's having pain & unable to work & would like to get some things in order before having her surgery  Last day worked 01/31/2021 (her schedule is Sunday-Thursday)   Please advise & call pt (pt states she's still waiting on her forms so she can get them to [place])

## 2021-02-09 NOTE — Patient Instructions (Signed)
Barbara Rogers  02/09/2021     @PREFPERIOPPHARMACY @   Your procedure is scheduled on  02/14/2021.   Report to [facility] at  [phone]  A.M.   Call this number if you have problems the morning of surgery:  [phone]   Remember:  Do not eat after midnight.   You may drink clear liquids until  0330 AM. At 0330 am, drink your carb drink and then nothing else to drink after this.               Take these medicines the morning of surgery with A SIP OF WATER  Xanax (if needed), adderall, suboxone, gabapentin, prilosec, zofran (if needed), zanaflex.    Please brush your teeth.  Do not wear jewelry, make-up or nail polish.  Do not wear lotions, powders, or perfumes, or deodorant.  Do not shave 48 hours prior to surgery.  Men may shave face and neck.  Do not bring valuables to the hospital.  [facility] is not responsible for any belongings or valuables.  Contacts, dentures or bridgework may not be worn into surgery.  Leave your suitcase in the car.  After surgery it may be brought to your room.  For patients admitted to the hospital, discharge time will be determined by your treatment team.  Patients discharged the day of surgery will not be allowed to drive home and must have someone with them for 24 hours.    Special instructions:  DO NOT smoke tobacco or vape for 24 hours before your procedure.  Please read over the following fact sheets that you were given. Coughing and Deep Breathing, Surgical Site Infection Prevention, Anesthesia Post-op Instructions and Care and Recovery After Surgery       Salpingectomy, Care After This sheet gives you information about how to care for yourself after your procedure. Your health care provider may also give you more specific instructions. If you have problems or questions, contact your health care provider. What can I expect after the procedure? After the procedure, it is common to have:  Pain in your abdomen.  Some light  vaginal bleeding (spotting) for a few days.  Tiredness. Your recovery time will vary depending on which method your surgeon used for your surgery. Follow these instructions at home: Incision care  Follow instructions from your health care provider about how to take care of your incisions. Make sure you: ? Wash your hands with soap and water before and after you change your bandage (dressing). If soap and water are not available, use hand sanitizer. ? Change or remove your dressing as told by your health care provider. ? Leave any stitches (sutures), skin glue, or adhesive strips in place. These skin closures may need to stay in place for 2 weeks or longer. If adhesive strip edges start to loosen and curl up, you may trim the loose edges. Do not remove adhesive strips completely unless your health care provider tells you to do that.  Keep your dressing clean and dry.  Check your incision area every day for signs of infection. Check for: ? Redness, swelling, or pain that gets worse. ? Fluid or blood. ? Warmth. ? Pus or a bad smell.   Activity  Rest as told by your health care provider.  Avoid sitting for a long time without moving. Get up to take short walks every 1-2 hours. This is important to improve blood flow and breathing. Ask for help if  you feel weak or unsteady.  Return to your normal activities as told by your health care provider. Ask your health care provider what activities are safe for you.  Do not drive until your health care provider says that it is safe.  Do not lift anything that is heavier than 10 lb (4.5 kg), or the limit that you are told, until your health care provider says that it is safe. This may be 2-6 weeks depending on your surgery.  Until your health care provider approves: ? Do not douche. ? Do not use tampons. ? Do not have sex. Medicines  Take over-the-counter and prescription medicines only as told by your health care provider.  Ask your health  care provider if the medicine prescribed to you: ? Requires you to avoid driving or using heavy machinery. ? Can cause constipation. You may need to take actions to prevent or treat constipation, such as:  Drink enough fluid to keep your urine pale yellow.  Take over-the-counter or prescription medicines.  Eat foods that are high in fiber, such as beans, whole grains, and fresh fruits and vegetables.  Limit foods that are high in fat and processed sugars, such as fried or sweet foods. General instructions  Wear compression stockings as told by your health care provider. These stockings help to prevent blood clots and reduce swelling in your legs.  Do not use any products that contain nicotine or tobacco, such as cigarettes, e-cigarettes, and chewing tobacco. If you need help quitting, ask your health care provider.  Do not take baths, swim, or use a hot tub until your health care provider approves. You may take showers.  Keep all follow-up visits as told by your health care provider. This is important. Contact a health care provider if you have:  Pain when you urinate.  Redness, swelling, or pain around an incision.  Fluid or blood coming from an incision.  Pus or a bad smell coming from an incision.  An incision that feels warm to the touch.  A fever.  Abdominal pain that gets worse or does not get better with medicine.  An incision that starts to break open.  A rash.  Light-headedness.  Nausea and vomiting. Get help right away if you:  Have pain in your chest or leg.  Develop shortness of breath.  Faint.  Have increased or heavy vaginal bleeding, such as soaking a pad in an hour. Summary  After the procedure, it is common to feel tired, have some pain in your abdomen, and have some light vaginal bleeding for a few days.  Follow instructions from your health care provider about how to take care of your incisions.  Return to your normal activities as told by  your health care provider. Ask your health care provider what activities are safe for you.  Do not douche, use tampons, or have sex until your health care provider approves.  Keep all follow-up visits as told by your health care provider. This information is not intended to replace advice given to you by your health care provider. Make sure you discuss any questions you have with your health care provider. Document Revised: 08/17/2018 Document Reviewed: 08/17/2018 Elsevier Patient Education  2021 [place].  Abdominal Hysterectomy, Care After The following information offers guidance on how to care for yourself after your procedure. Your doctor may also give you more specific instructions. If you have problems or questions, contact your doctor. What can I expect after the procedure? After the procedure,  it is common to have:  Pain.  Tiredness.  No desire to eat.  Less interest in sex.  Bleeding and fluid (discharge) from your vagina. You may need to use a pad after this procedure.  Trouble pooping (constipation).  Feelings of sadness or other emotions. Follow these instructions at home: Medicines  Take over-the-counter and prescription medicines only as told by your doctor.  Do not take aspirin or NSAIDs, such as ibuprofen. These medicines can cause bleeding.  If you were prescribed an antibiotic medicine, take it as told by your doctor. Do not stop using the antibiotic even if you start to feel better.  If told, take steps to prevent problems with pooping (constipation). You may need to: ? Drink enough fluid to keep your pee (urine) pale yellow. ? Take medicines. You will be told what medicines to take. ? Eat foods that are high in fiber. These include beans, whole grains, and fresh fruits and vegetables. ? Limit foods that are high in fat and sugar. These include fried or sweet foods.  Ask your doctor if you should avoid driving or using machines while you are taking  your medicine. Surgical cut care  Follow instructions from your doctor about how to take care of your cut from surgery (incision). Make sure you: ? Wash your hands with soap and water for at least 20 seconds before and after you change your bandage. If you cannot use soap and water, use hand sanitizer. ? Change your bandage. ? Leave stitches or skin glue in place for at least two weeks. ? Leave tape strips alone unless you are told to take them off. You may trim the edges of the tape strips if they curl up.  Keep the bandage dry until your doctor says it can be taken off.  Check your incision every day for signs of infection. Check for: ? More redness, swelling, or pain. ? Fluid or blood. ? Warmth. ? Pus or a bad smell.   Activity  Rest as told by your doctor.  Get up to take short walks every 1 to 2 hours. Ask for help if you feel weak or unsteady.  Do not lift anything that is heavier than 10 lb (4.5 kg), or the limit that you are told.  Follow your doctor's advice about exercise, driving, and general activities.  Return to your normal activities when your doctor says that it is safe.   Lifestyle  Do not douche, use tampons, or have sex for at least 6 weeks or as told by your doctor.  Do not drink alcohol until your doctor says it is okay.  Do not smoke or use any products that contain nicotine or tobacco. These can delay healing after surgery. If you need help quitting, ask your doctor. General instructions  Do not take baths, swim, or use a hot tub. Ask your doctor about taking showers or sponge baths.  Try to have a responsible adult at home with you for the first 1-2 weeks to help with your daily chores.  Wear tight-fitting (compression) stockings as told by your doctor.  Keep all follow-up visits. Contact a doctor if:  You have chills or a fever.  You have any of these signs of infection around your cut: ? More redness, swelling, or pain. ? Fluid or  blood. ? Warmth. ? Pus or a bad smell.  Your cut breaks open.  You feel dizzy or light-headed.  You have pain or bleeding when you pee.  You  keep having watery poop (diarrhea).  You keep feeling like you may vomit or you keep vomiting.  You have fluid coming from your vagina that is not normal.  You have any type of reaction to your medicine that is not normal, like a rash, or you develop an allergy to your medicine.  Your pain medicine does not help. Get help right away if:  You have a fever and your symptoms get worse suddenly.  You have very bad pain in your belly (abdomen).  You are short of breath.  You faint.  You have pain, swelling, or redness of your leg.  You bleed a lot from your vagina and you see blood clots. Summary  It is normal to have some pain, tiredness, and fluid that comes from your vagina.  Do not take baths, swim, or use a hot tub. Ask your doctor about taking showers or sponge baths.  Do not lift anything that is heavier than 10 lb (4.5 kg), or the limit that you are told.  Follow your doctor's advice about exercise, driving, and general activities.  Try to have a responsible adult at home with you for the first 1-2 weeks to help with your daily chores. This information is not intended to replace advice given to you by your health care provider. Make sure you discuss any questions you have with your health care provider. Document Revised: 04/27/2020 Document Reviewed: 04/27/2020 Elsevier Patient Education  2021 [place]. How to Use Chlorhexidine for Bathing Chlorhexidine gluconate (CHG) is a germ-killing (antiseptic) solution that is used to clean the skin. It can get rid of the bacteria that normally live on the skin and can keep them away for about 24 hours. To clean your skin with CHG, you may be given:  A CHG solution to use in the shower or as part of a sponge bath.  A prepackaged cloth that contains CHG. Cleaning your skin with  CHG may help lower the risk for infection:  While you are staying in the intensive care unit of the hospital.  If you have a vascular access, such as a central line, to provide short-term or long-term access to your veins.  If you have a catheter to drain urine from your bladder.  If you are on a ventilator. A ventilator is a machine that helps you breathe by moving air in and out of your lungs.  After surgery. What are the risks? Risks of using CHG include:  A skin reaction.  Hearing loss, if CHG gets in your ears.  Eye injury, if CHG gets in your eyes and is not rinsed out.  The CHG product catching fire. Make sure that you avoid smoking and flames after applying CHG to your skin. Do not use CHG:  If you have a chlorhexidine allergy or have previously reacted to chlorhexidine.  On babies younger than 29 months of age. How to use CHG solution  Use CHG only as told by your health care provider, and follow the instructions on the label.  Use the full amount of CHG as directed. Usually, this is one bottle. During a shower Follow these steps when using CHG solution during a shower (unless your health care provider gives you different instructions): 1. Start the shower. 2. Use your normal soap and shampoo to wash your face and hair. 3. Turn off the shower or move out of the shower stream. 4. Pour the CHG onto a clean washcloth. Do not use any type of brush or  rough-edged sponge. 5. Starting at your neck, lather your body down to your toes. Make sure you follow these instructions: ? If you will be having surgery, pay special attention to the part of your body where you will be having surgery. Scrub this area for at least 1 minute. ? Do not use CHG on your head or face. If the solution gets into your ears or eyes, rinse them well with water. ? Avoid your genital area. ? Avoid any areas of skin that have broken skin, cuts, or scrapes. ? Scrub your back and under your arms. Make  sure to wash skin folds. 6. Let the lather sit on your skin for 1-2 minutes or as long as told by your health care provider. 7. Thoroughly rinse your entire body in the shower. Make sure that all body creases and crevices are rinsed well. 8. Dry off with a clean towel. Do not put any substances on your body afterward--such as powder, lotion, or perfume--unless you are told to do so by your health care provider. Only use lotions that are recommended by the manufacturer. 9. Put on clean clothes or pajamas. 10. If it is the night before your surgery, sleep in clean sheets.   During a sponge bath Follow these steps when using CHG solution during a sponge bath (unless your health care provider gives you different instructions): 1. Use your normal soap and shampoo to wash your face and hair. 2. Pour the CHG onto a clean washcloth. 3. Starting at your neck, lather your body down to your toes. Make sure you follow these instructions: ? If you will be having surgery, pay special attention to the part of your body where you will be having surgery. Scrub this area for at least 1 minute. ? Do not use CHG on your head or face. If the solution gets into your ears or eyes, rinse them well with water. ? Avoid your genital area. ? Avoid any areas of skin that have broken skin, cuts, or scrapes. ? Scrub your back and under your arms. Make sure to wash skin folds. 4. Let the lather sit on your skin for 1-2 minutes or as long as told by your health care provider. 5. Using a different clean, wet washcloth, thoroughly rinse your entire body. Make sure that all body creases and crevices are rinsed well. 6. Dry off with a clean towel. Do not put any substances on your body afterward--such as powder, lotion, or perfume--unless you are told to do so by your health care provider. Only use lotions that are recommended by the manufacturer. 7. Put on clean clothes or pajamas. 8. If it is the night before your surgery, sleep  in clean sheets. How to use CHG prepackaged cloths  Only use CHG cloths as told by your health care provider, and follow the instructions on the label.  Use the CHG cloth on clean, dry skin.  Do not use the CHG cloth on your head or face unless your health care provider tells you to.  When washing with the CHG cloth: ? Avoid your genital area. ? Avoid any areas of skin that have broken skin, cuts, or scrapes. Before surgery Follow these steps when using a CHG cloth to clean before surgery (unless your health care provider gives you different instructions): 1. Using the CHG cloth, vigorously scrub the part of your body where you will be having surgery. Scrub using a back-and-forth motion for 3 minutes. The area on your body should  be completely wet with CHG when you are done scrubbing. 2. Do not rinse. Discard the cloth and let the area air-dry. Do not put any substances on the area afterward, such as powder, lotion, or perfume. 3. Put on clean clothes or pajamas. 4. If it is the night before your surgery, sleep in clean sheets.   For general bathing Follow these steps when using CHG cloths for general bathing (unless your health care provider gives you different instructions). 1. Use a separate CHG cloth for each area of your body. Make sure you wash between any folds of skin and between your fingers and toes. Wash your body in the following order, switching to a new cloth after each step: ? The front of your neck, shoulders, and chest. ? Both of your arms, under your arms, and your hands. ? Your stomach and groin area, avoiding the genitals. ? Your right leg and foot. ? Your left leg and foot. ? The back of your neck, your back, and your buttocks. 2. Do not rinse. Discard the cloth and let the area air-dry. Do not put any substances on your body afterward--such as powder, lotion, or perfume--unless you are told to do so by your health care provider. Only use lotions that are recommended  by the manufacturer. 3. Put on clean clothes or pajamas. Contact a health care provider if:  Your skin gets irritated after scrubbing.  You have questions about using your solution or cloth. Get help right away if:  Your eyes become very red or swollen.  Your eyes itch badly.  Your skin itches badly and is red or swollen.  Your hearing changes.  You have trouble seeing.  You have swelling or tingling in your mouth or throat.  You have trouble breathing.  You swallow any chlorhexidine. Summary  Chlorhexidine gluconate (CHG) is a germ-killing (antiseptic) solution that is used to clean the skin. Cleaning your skin with CHG may help to lower your risk for infection.  You may be given CHG to use for bathing. It may be in a bottle or in a prepackaged cloth to use on your skin. Carefully follow your health care provider's instructions and the instructions on the product label.  Do not use CHG if you have a chlorhexidine allergy.  Contact your health care provider if your skin gets irritated after scrubbing. This information is not intended to replace advice given to you by your health care provider. Make sure you discuss any questions you have with your health care provider. Document Revised: 02/11/2020 Document Reviewed: 02/11/2020 Elsevier Patient Education  [place].

## 2021-02-11 ENCOUNTER — Other Ambulatory Visit: Payer: Self-pay | Admitting: Obstetrics & Gynecology

## 2021-02-12 ENCOUNTER — Encounter (HOSPITAL_COMMUNITY)
Admission: RE | Admit: 2021-02-12 | Discharge: 2021-02-12 | Disposition: A | Discharge status: Home / Self Care | Payer: BC Managed Care – PPO | Source: Ambulatory Visit | Attending: Obstetrics & Gynecology | Admitting: Obstetrics & Gynecology

## 2021-02-12 ENCOUNTER — Other Ambulatory Visit: Payer: Self-pay

## 2021-02-12 ENCOUNTER — Other Ambulatory Visit (HOSPITAL_COMMUNITY): Payer: BC Managed Care – PPO | Attending: Obstetrics & Gynecology

## 2021-02-12 ENCOUNTER — Encounter (HOSPITAL_COMMUNITY): Payer: Self-pay

## 2021-02-12 DIAGNOSIS — Z01818 Encounter for other preprocedural examination: Secondary | ICD-10-CM | POA: Insufficient documentation

## 2021-02-12 LAB — COMPREHENSIVE METABOLIC PANEL
ALT: 15 U/L (ref 0–44)
AST: 18 U/L (ref 15–41)
Albumin: 3.7 g/dL (ref 3.5–5.0)
Alkaline Phosphatase: 139 U/L — ABNORMAL HIGH (ref 38–126)
Anion gap: 8 (ref 5–15)
BUN: 16 mg/dL (ref 6–20)
CO2: 25 mmol/L (ref 22–32)
Calcium: 8.9 mg/dL (ref 8.9–10.3)
Chloride: 103 mmol/L (ref 98–111)
Creatinine, Ser: 0.95 mg/dL (ref 0.44–1.00)
GFR, Estimated: >60 mL/min (ref >60)
Glucose, Bld: 137 mg/dL — ABNORMAL HIGH (ref 70–99)
Potassium: 3.6 mmol/L (ref 3.5–5.1)
Sodium: 136 mmol/L (ref 135–145)
Total Bilirubin: 0.8 mg/dL (ref 0.3–1.2)
Total Protein: 8.2 g/dL — ABNORMAL HIGH (ref 6.5–8.1)

## 2021-02-12 LAB — CBC
HCT: 45.1 % (ref 36.0–46.0)
Hemoglobin: 14.7 g/dL (ref 12.0–15.0)
MCH: 30.9 pg (ref 26.0–34.0)
MCHC: 32.6 g/dL (ref 30.0–36.0)
MCV: 94.7 fL (ref 80.0–100.0)
Platelets: 285 10*3/uL (ref 150–400)
RBC: 4.76 MIL/uL (ref 3.87–5.11)
RDW: 13.7 % (ref 11.5–15.5)
WBC: 17.9 10*3/uL — ABNORMAL HIGH (ref 4.0–10.5)
nRBC: 0 % (ref 0.0–0.2)

## 2021-02-12 LAB — URINALYSIS, ROUTINE W REFLEX MICROSCOPIC
Bilirubin Urine: NEGATIVE
Glucose, UA: NEGATIVE mg/dL
Ketones, ur: 5 mg/dL — AB
Nitrite: NEGATIVE
Protein, ur: 100 mg/dL — AB
Specific Gravity, Urine: 1.019 (ref 1.005–1.030)
WBC, UA: >50 WBC/hpf — ABNORMAL HIGH (ref 0–5)
pH: 5 (ref 5.0–8.0)

## 2021-02-12 LAB — RAPID HIV SCREEN (HIV 1/2 AB+AG)
HIV 1/2 Antibodies: NONREACTIVE
HIV-1 P24 Antigen - HIV24: NONREACTIVE

## 2021-02-12 LAB — HCG, QUANTITATIVE, PREGNANCY: hCG, Beta Chain, Quant, S: 1 m[IU]/mL (ref <5)

## 2021-02-12 LAB — TYPE AND SCREEN
ABO/RH(D): O POS
Antibody Screen: NEGATIVE

## 2021-02-13 MED ORDER — GENTAMICIN SULFATE 40 MG/ML IJ SOLN
5.0000 mg/kg | INTRAVENOUS | Status: AC
  Administered 2021-02-14: 350 mg via INTRAVENOUS
  Filled 2021-02-13: qty 8.75

## 2021-02-13 MED ORDER — CLINDAMYCIN PHOSPHATE 900 MG/50ML IV SOLN
900.0000 mg | INTRAVENOUS | Status: AC
  Administered 2021-02-14: 900 mg via INTRAVENOUS
  Filled 2021-02-13: qty 50

## 2021-02-14 ENCOUNTER — Encounter (HOSPITAL_COMMUNITY): Payer: Self-pay | Admitting: Obstetrics & Gynecology

## 2021-02-14 ENCOUNTER — Inpatient Hospital Stay (HOSPITAL_COMMUNITY)
Admission: AD | Admit: 2021-02-14 | Discharge: 2021-02-19 | DRG: 743 | Disposition: A | Discharge status: Home / Self Care | Payer: BC Managed Care – PPO | Attending: Obstetrics & Gynecology | Admitting: Obstetrics & Gynecology

## 2021-02-14 ENCOUNTER — Encounter (HOSPITAL_COMMUNITY)
Admission: AD | Disposition: A | Discharge status: Home / Self Care | Payer: Self-pay | Source: Home / Self Care | Attending: Obstetrics & Gynecology

## 2021-02-14 ENCOUNTER — Ambulatory Visit (HOSPITAL_COMMUNITY): Payer: BC Managed Care – PPO | Admitting: Anesthesiology

## 2021-02-14 ENCOUNTER — Other Ambulatory Visit: Payer: Self-pay

## 2021-02-14 DIAGNOSIS — Z880 Allergy status to penicillin: Secondary | ICD-10-CM | POA: Diagnosis not present

## 2021-02-14 DIAGNOSIS — F17210 Nicotine dependence, cigarettes, uncomplicated: Secondary | ICD-10-CM | POA: Diagnosis present

## 2021-02-14 DIAGNOSIS — Z8249 Family history of ischemic heart disease and other diseases of the circulatory system: Secondary | ICD-10-CM

## 2021-02-14 DIAGNOSIS — D282 Benign neoplasm of uterine tubes and ligaments: Secondary | ICD-10-CM | POA: Diagnosis present

## 2021-02-14 DIAGNOSIS — Z8261 Family history of arthritis: Secondary | ICD-10-CM

## 2021-02-14 DIAGNOSIS — Z888 Allergy status to other drugs, medicaments and biological substances status: Secondary | ICD-10-CM | POA: Diagnosis not present

## 2021-02-14 DIAGNOSIS — Z981 Arthrodesis status: Secondary | ICD-10-CM | POA: Diagnosis not present

## 2021-02-14 DIAGNOSIS — Z20822 Contact with and (suspected) exposure to covid-19: Secondary | ICD-10-CM | POA: Diagnosis present

## 2021-02-14 DIAGNOSIS — N946 Dysmenorrhea, unspecified: Secondary | ICD-10-CM | POA: Diagnosis present

## 2021-02-14 DIAGNOSIS — N939 Abnormal uterine and vaginal bleeding, unspecified: Secondary | ICD-10-CM

## 2021-02-14 DIAGNOSIS — D259 Leiomyoma of uterus, unspecified: Principal | ICD-10-CM | POA: Diagnosis present

## 2021-02-14 DIAGNOSIS — D219 Benign neoplasm of connective and other soft tissue, unspecified: Secondary | ICD-10-CM

## 2021-02-14 DIAGNOSIS — N838 Other noninflammatory disorders of ovary, fallopian tube and broad ligament: Secondary | ICD-10-CM | POA: Diagnosis not present

## 2021-02-14 DIAGNOSIS — Z90710 Acquired absence of both cervix and uterus: Secondary | ICD-10-CM | POA: Diagnosis present

## 2021-02-14 HISTORY — PX: HYSTERECTOMY ABDOMINAL WITH SALPINGO-OOPHORECTOMY: SHX6792

## 2021-02-14 LAB — SARS CORONAVIRUS 2 BY RT PCR (HOSPITAL ORDER, PERFORMED IN ~~LOC~~ HOSPITAL LAB): SARS Coronavirus 2: NEGATIVE

## 2021-02-14 SURGERY — HYSTERECTOMY, ABDOMINAL, WITH SALPINGO-OOPHORECTOMY
Anesthesia: General | Site: Abdomen | Laterality: Bilateral

## 2021-02-14 MED ORDER — CHLORHEXIDINE GLUCONATE CLOTH 2 % EX PADS
6.0000 | MEDICATED_PAD | Freq: Every day | CUTANEOUS | Status: DC
  Administered 2021-02-14 – 2021-02-15 (×2): 6 via TOPICAL

## 2021-02-14 MED ORDER — PROPOFOL 10 MG/ML IV BOLUS
INTRAVENOUS | Status: DC | PRN
  Administered 2021-02-14: 200 mg via INTRAVENOUS

## 2021-02-14 MED ORDER — PHENYLEPHRINE HCL (PRESSORS) 10 MG/ML IV SOLN
INTRAVENOUS | Status: DC | PRN
  Administered 2021-02-14: 120 ug via INTRAVENOUS
  Administered 2021-02-14: 160 ug via INTRAVENOUS
  Administered 2021-02-14: 120 ug via INTRAVENOUS

## 2021-02-14 MED ORDER — SODIUM CHLORIDE 0.9 % IV SOLN
25.0000 mg | Freq: Four times a day (QID) | INTRAVENOUS | Status: DC | PRN
  Filled 2021-02-14: qty 1

## 2021-02-14 MED ORDER — SCOPOLAMINE 1 MG/3DAYS TD PT72
1.0000 | MEDICATED_PATCH | Freq: Once | TRANSDERMAL | Status: DC
  Administered 2021-02-14: 1.5 mg via TRANSDERMAL
  Filled 2021-02-14: qty 1

## 2021-02-14 MED ORDER — BUPIVACAINE-MELOXICAM ER 200-6 MG/7ML IJ SOLN
300.0000 mg | Freq: Once | INTRAMUSCULAR | Status: DC
  Filled 2021-02-14: qty 2

## 2021-02-14 MED ORDER — LIDOCAINE HCL (PF) 2 % IJ SOLN
INTRAMUSCULAR | Status: AC
  Filled 2021-02-14: qty 5

## 2021-02-14 MED ORDER — SIMETHICONE 80 MG PO CHEW: 80.0000 mg | CHEWABLE_TABLET | Freq: Four times a day (QID) | ORAL | Status: DC | PRN

## 2021-02-14 MED ORDER — SENNOSIDES-DOCUSATE SODIUM 8.6-50 MG PO TABS: 1.0000 | ORAL_TABLET | Freq: Every evening | ORAL | Status: DC | PRN

## 2021-02-14 MED ORDER — OXYCODONE HCL 5 MG PO TABS
5.0000 mg | ORAL_TABLET | ORAL | Status: DC | PRN
Start: 2021-02-14 — End: 2021-02-16
  Administered 2021-02-15 – 2021-02-16 (×4): 10 mg via ORAL
  Filled 2021-02-14 (×4): qty 2

## 2021-02-14 MED ORDER — DOCUSATE SODIUM 100 MG PO CAPS
100.0000 mg | ORAL_CAPSULE | Freq: Two times a day (BID) | ORAL | Status: DC
  Administered 2021-02-14 – 2021-02-19 (×11): 100 mg via ORAL
  Filled 2021-02-14 (×11): qty 1

## 2021-02-14 MED ORDER — TIZANIDINE HCL 4 MG PO TABS
4.0000 mg | ORAL_TABLET | Freq: Three times a day (TID) | ORAL | Status: DC | PRN
  Administered 2021-02-16 – 2021-02-19 (×4): 4 mg via ORAL
  Filled 2021-02-14 (×4): qty 1

## 2021-02-14 MED ORDER — ONDANSETRON HCL 4 MG/2ML IJ SOLN
INTRAMUSCULAR | Status: AC
  Filled 2021-02-14: qty 2

## 2021-02-14 MED ORDER — MEPERIDINE HCL 50 MG/ML IJ SOLN
6.2500 mg | INTRAMUSCULAR | Status: DC | PRN
  Administered 2021-02-14 (×3): 12.5 mg via INTRAVENOUS
  Filled 2021-02-14: qty 1

## 2021-02-14 MED ORDER — ONDANSETRON HCL 4 MG PO TABS: 8.0000 mg | ORAL_TABLET | Freq: Four times a day (QID) | ORAL | Status: DC | PRN

## 2021-02-14 MED ORDER — HYDROMORPHONE HCL 1 MG/ML IJ SOLN
INTRAMUSCULAR | Status: DC | PRN
  Administered 2021-02-14 (×2): .5 mg via INTRAVENOUS

## 2021-02-14 MED ORDER — BUPRENORPHINE HCL-NALOXONE HCL 8-2 MG SL SUBL: 2.0000 | SUBLINGUAL_TABLET | Freq: Every day | SUBLINGUAL | Status: DC

## 2021-02-14 MED ORDER — HEMOSTATIC AGENTS (NO CHARGE) OPTIME
TOPICAL | Status: DC | PRN
  Administered 2021-02-14: 1 via TOPICAL

## 2021-02-14 MED ORDER — BUPRENORPHINE HCL-NALOXONE HCL 8-2 MG SL SUBL
2.0000 | SUBLINGUAL_TABLET | Freq: Every day | SUBLINGUAL | Status: DC
  Administered 2021-02-14 – 2021-02-19 (×5): 2 via SUBLINGUAL
  Filled 2021-02-14 (×5): qty 2

## 2021-02-14 MED ORDER — ONDANSETRON HCL 4 MG/2ML IJ SOLN: 4.0000 mg | Freq: Once | INTRAMUSCULAR | Status: DC | PRN

## 2021-02-14 MED ORDER — FENTANYL CITRATE (PF) 100 MCG/2ML IJ SOLN
INTRAMUSCULAR | Status: AC
  Filled 2021-02-14: qty 2

## 2021-02-14 MED ORDER — ZOLPIDEM TARTRATE 5 MG PO TABS
5.0000 mg | ORAL_TABLET | Freq: Every day | ORAL | Status: DC
  Administered 2021-02-14 – 2021-02-18 (×5): 5 mg via ORAL
  Filled 2021-02-14 (×5): qty 1

## 2021-02-14 MED ORDER — PROPOFOL 10 MG/ML IV BOLUS
INTRAVENOUS | Status: AC
  Filled 2021-02-14: qty 20

## 2021-02-14 MED ORDER — CHLORHEXIDINE GLUCONATE 0.12 % MT SOLN
15.0000 mL | Freq: Once | OROMUCOSAL | Status: AC
  Administered 2021-02-14: 15 mL via OROMUCOSAL
  Filled 2021-02-14: qty 15

## 2021-02-14 MED ORDER — AZELASTINE HCL 0.1 % NA SOLN
2.0000 | NASAL | Status: DC
  Filled 2021-02-14 (×2): qty 30

## 2021-02-14 MED ORDER — ORAL CARE MOUTH RINSE: 15.0000 mL | Freq: Once | OROMUCOSAL | Status: AC

## 2021-02-14 MED ORDER — DEXAMETHASONE SODIUM PHOSPHATE 10 MG/ML IJ SOLN
INTRAMUSCULAR | Status: AC
  Filled 2021-02-14: qty 1

## 2021-02-14 MED ORDER — GENTAMICIN SULFATE 40 MG/ML IJ SOLN
5.0000 mg/kg | INTRAVENOUS | Status: DC
  Administered 2021-02-15: 350 mg via INTRAVENOUS
  Filled 2021-02-14 (×5): qty 8.75

## 2021-02-14 MED ORDER — DEXAMETHASONE SODIUM PHOSPHATE 10 MG/ML IJ SOLN
INTRAMUSCULAR | Status: DC | PRN
  Administered 2021-02-14: 10 mg via INTRAVENOUS

## 2021-02-14 MED ORDER — ACETAMINOPHEN 500 MG PO TABS
1000.0000 mg | ORAL_TABLET | Freq: Once | ORAL | Status: AC
  Administered 2021-02-14: 1000 mg via ORAL
  Filled 2021-02-14: qty 2

## 2021-02-14 MED ORDER — DEXMEDETOMIDINE (PRECEDEX) IN NS 20 MCG/5ML (4 MCG/ML) IV SYRINGE
PREFILLED_SYRINGE | INTRAVENOUS | Status: AC
  Filled 2021-02-14: qty 5

## 2021-02-14 MED ORDER — ONDANSETRON HCL 4 MG/2ML IJ SOLN
INTRAMUSCULAR | Status: DC | PRN
  Administered 2021-02-14: 4 mg via INTRAVENOUS

## 2021-02-14 MED ORDER — GABAPENTIN 400 MG PO CAPS
800.0000 mg | ORAL_CAPSULE | Freq: Three times a day (TID) | ORAL | Status: DC
  Administered 2021-02-14 – 2021-02-19 (×15): 800 mg via ORAL
  Filled 2021-02-14 (×15): qty 2

## 2021-02-14 MED ORDER — KCL IN DEXTROSE-NACL 20-5-0.45 MEQ/L-%-% IV SOLN: INTRAVENOUS | Status: DC

## 2021-02-14 MED ORDER — ROCURONIUM BROMIDE 10 MG/ML (PF) SYRINGE
PREFILLED_SYRINGE | INTRAVENOUS | Status: AC
  Filled 2021-02-14: qty 10

## 2021-02-14 MED ORDER — SODIUM CHLORIDE 0.9 % IV SOLN
8.0000 mg | Freq: Four times a day (QID) | INTRAVENOUS | Status: DC | PRN
  Filled 2021-02-14: qty 4

## 2021-02-14 MED ORDER — ROCURONIUM BROMIDE 100 MG/10ML IV SOLN
INTRAVENOUS | Status: DC | PRN
  Administered 2021-02-14: 10 mg via INTRAVENOUS
  Administered 2021-02-14: 50 mg via INTRAVENOUS
  Administered 2021-02-14: 10 mg via INTRAVENOUS

## 2021-02-14 MED ORDER — METOCLOPRAMIDE HCL 5 MG/ML IJ SOLN
10.0000 mg | Freq: Once | INTRAMUSCULAR | Status: AC
  Administered 2021-02-14: 10 mg via INTRAVENOUS
  Filled 2021-02-14: qty 2

## 2021-02-14 MED ORDER — POVIDONE-IODINE 10 % EX SWAB
2.0000 "application " | Freq: Once | CUTANEOUS | Status: AC
  Administered 2021-02-14: 2 via TOPICAL

## 2021-02-14 MED ORDER — ALPRAZOLAM 1 MG PO TABS
1.0000 mg | ORAL_TABLET | ORAL | Status: DC | PRN
  Administered 2021-02-15 – 2021-02-19 (×24): 1 mg via ORAL
  Filled 2021-02-14 (×24): qty 1

## 2021-02-14 MED ORDER — LIDOCAINE HCL (CARDIAC) PF 100 MG/5ML IV SOSY
PREFILLED_SYRINGE | INTRAVENOUS | Status: DC | PRN
  Administered 2021-02-14: 100 mg via INTRAVENOUS

## 2021-02-14 MED ORDER — ALBUTEROL SULFATE (2.5 MG/3ML) 0.083% IN NEBU: 3.0000 mL | INHALATION_SOLUTION | RESPIRATORY_TRACT | Status: DC | PRN

## 2021-02-14 MED ORDER — PANTOPRAZOLE SODIUM 40 MG PO TBEC
40.0000 mg | DELAYED_RELEASE_TABLET | Freq: Every day | ORAL | Status: DC
  Administered 2021-02-14 – 2021-02-19 (×6): 40 mg via ORAL
  Filled 2021-02-14 (×6): qty 1

## 2021-02-14 MED ORDER — DEXMEDETOMIDINE (PRECEDEX) IN NS 20 MCG/5ML (4 MCG/ML) IV SYRINGE
PREFILLED_SYRINGE | INTRAVENOUS | Status: DC | PRN
  Administered 2021-02-14: 8 ug via INTRAVENOUS
  Administered 2021-02-14: 12 ug via INTRAVENOUS

## 2021-02-14 MED ORDER — FENTANYL CITRATE (PF) 100 MCG/2ML IJ SOLN
INTRAMUSCULAR | Status: DC | PRN
  Administered 2021-02-14 (×2): 50 ug via INTRAVENOUS
  Administered 2021-02-14 (×3): 100 ug via INTRAVENOUS
  Administered 2021-02-14: 50 ug via INTRAVENOUS

## 2021-02-14 MED ORDER — PHENYLEPHRINE 40 MCG/ML (10ML) SYRINGE FOR IV PUSH (FOR BLOOD PRESSURE SUPPORT)
PREFILLED_SYRINGE | INTRAVENOUS | Status: AC
  Filled 2021-02-14: qty 10

## 2021-02-14 MED ORDER — LACTATED RINGERS IV SOLN: INTRAVENOUS | Status: DC

## 2021-02-14 MED ORDER — HYDROMORPHONE HCL 1 MG/ML IJ SOLN
INTRAMUSCULAR | Status: AC
  Filled 2021-02-14: qty 1

## 2021-02-14 MED ORDER — MIDAZOLAM HCL 2 MG/2ML IJ SOLN
INTRAMUSCULAR | Status: AC
  Filled 2021-02-14: qty 2

## 2021-02-14 MED ORDER — IBUPROFEN 600 MG PO TABS
600.0000 mg | ORAL_TABLET | Freq: Four times a day (QID) | ORAL | Status: DC
  Administered 2021-02-15 – 2021-02-19 (×13): 600 mg via ORAL
  Filled 2021-02-14 (×14): qty 1

## 2021-02-14 MED ORDER — DIPHENHYDRAMINE HCL 50 MG/ML IJ SOLN: 25.0000 mg | Freq: Four times a day (QID) | INTRAMUSCULAR | Status: DC | PRN

## 2021-02-14 MED ORDER — GABAPENTIN 300 MG PO CAPS
300.0000 mg | ORAL_CAPSULE | Freq: Once | ORAL | Status: DC
  Filled 2021-02-14: qty 1

## 2021-02-14 MED ORDER — KETOROLAC TROMETHAMINE 30 MG/ML IJ SOLN
30.0000 mg | Freq: Once | INTRAMUSCULAR | Status: AC
  Administered 2021-02-14: 30 mg via INTRAVENOUS
  Filled 2021-02-14: qty 1

## 2021-02-14 MED ORDER — ALUM & MAG HYDROXIDE-SIMETH 200-200-20 MG/5ML PO SUSP
30.0000 mL | ORAL | Status: DC | PRN
  Administered 2021-02-15: 30 mL via ORAL
  Filled 2021-02-14: qty 30

## 2021-02-14 MED ORDER — BISACODYL 10 MG RE SUPP
10.0000 mg | Freq: Every day | RECTAL | Status: DC | PRN
  Administered 2021-02-17 – 2021-02-19 (×3): 10 mg via RECTAL
  Filled 2021-02-14 (×3): qty 1

## 2021-02-14 MED ORDER — SODIUM CHLORIDE (PF) 0.9 % IJ SOLN
INTRAMUSCULAR | Status: AC
  Filled 2021-02-14: qty 40

## 2021-02-14 MED ORDER — DIVALPROEX SODIUM ER 500 MG PO TB24
1000.0000 mg | ORAL_TABLET | Freq: Every day | ORAL | Status: DC
  Administered 2021-02-14 – 2021-02-16 (×3): 1000 mg via ORAL
  Filled 2021-02-14 (×5): qty 2

## 2021-02-14 MED ORDER — KETOROLAC TROMETHAMINE 30 MG/ML IJ SOLN
30.0000 mg | Freq: Four times a day (QID) | INTRAMUSCULAR | Status: AC
  Administered 2021-02-14 – 2021-02-15 (×4): 30 mg via INTRAVENOUS
  Filled 2021-02-14 (×4): qty 1

## 2021-02-14 MED ORDER — CLINDAMYCIN PHOSPHATE 900 MG/50ML IV SOLN
900.0000 mg | Freq: Three times a day (TID) | INTRAVENOUS | Status: DC
  Administered 2021-02-14 – 2021-02-18 (×11): 900 mg via INTRAVENOUS
  Filled 2021-02-14 (×15): qty 50

## 2021-02-14 MED ORDER — ENOXAPARIN SODIUM 40 MG/0.4ML IJ SOSY
40.0000 mg | PREFILLED_SYRINGE | INTRAMUSCULAR | Status: DC
  Administered 2021-02-15 – 2021-02-19 (×5): 40 mg via SUBCUTANEOUS
  Filled 2021-02-14 (×5): qty 0.4

## 2021-02-14 MED ORDER — SUGAMMADEX SODIUM 200 MG/2ML IV SOLN
INTRAVENOUS | Status: DC | PRN
  Administered 2021-02-14: 170 mg via INTRAVENOUS

## 2021-02-14 MED ORDER — BUPIVACAINE LIPOSOME 1.3 % IJ SUSP
INTRAMUSCULAR | Status: AC
  Filled 2021-02-14: qty 20

## 2021-02-14 MED ORDER — MIDAZOLAM HCL 5 MG/5ML IJ SOLN
INTRAMUSCULAR | Status: DC | PRN
  Administered 2021-02-14: 2 mg via INTRAVENOUS

## 2021-02-14 MED ORDER — FENTANYL CITRATE (PF) 100 MCG/2ML IJ SOLN
50.0000 ug | INTRAMUSCULAR | Status: DC | PRN
  Administered 2021-02-14: 50 ug via INTRAVENOUS
  Administered 2021-02-15 – 2021-02-16 (×6): 100 ug via INTRAVENOUS
  Filled 2021-02-14 (×7): qty 2

## 2021-02-14 MED ORDER — FENTANYL CITRATE (PF) 250 MCG/5ML IJ SOLN
INTRAMUSCULAR | Status: AC
  Filled 2021-02-14: qty 5

## 2021-02-14 MED ORDER — SODIUM CHLORIDE 0.9 % IV SOLN
25.0000 mg | Freq: Once | INTRAVENOUS | Status: AC
  Administered 2021-02-14: 25 mg via INTRAVENOUS
  Filled 2021-02-14: qty 1

## 2021-02-14 MED ORDER — SODIUM CHLORIDE 0.9 % IR SOLN
Status: DC | PRN
  Administered 2021-02-14 (×6): 1000 mL

## 2021-02-14 MED ORDER — PROMETHAZINE HCL 25 MG/ML IJ SOLN
6.2500 mg | INTRAMUSCULAR | Status: AC | PRN
  Administered 2021-02-14 (×2): 6.25 mg via INTRAVENOUS
  Filled 2021-02-14: qty 1

## 2021-02-14 MED ORDER — AMPHETAMINE-DEXTROAMPHETAMINE 10 MG PO TABS
20.0000 mg | ORAL_TABLET | Freq: Three times a day (TID) | ORAL | Status: DC | PRN
  Administered 2021-02-19: 20 mg via ORAL
  Filled 2021-02-14: qty 2

## 2021-02-14 MED ORDER — BUPIVACAINE-MELOXICAM ER 200-6 MG/7ML IJ SOLN
INTRAMUSCULAR | Status: DC | PRN
  Administered 2021-02-14: 5 mL

## 2021-02-14 MED ORDER — HYDROMORPHONE HCL 1 MG/ML IJ SOLN
0.2500 mg | INTRAMUSCULAR | Status: DC | PRN
  Administered 2021-02-14 (×4): 0.5 mg via INTRAVENOUS
  Filled 2021-02-14 (×5): qty 0.5

## 2021-02-14 SURGICAL SUPPLY — 59 items
ADH SKN CLS APL DERMABOND .7 (GAUZE/BANDAGES/DRESSINGS) ×3
APPLIER CLIP 13 LRG OPEN (CLIP)
APR CLP LRG 13 20 CLIP (CLIP)
CLIP APPLIE 13 LRG OPEN (CLIP) IMPLANT
CLOTH BEACON ORANGE TIMEOUT ST (SAFETY) ×4 IMPLANT
CNTNR URN SCR LID CUP LEK RST (MISCELLANEOUS) ×2 IMPLANT
CONT SPEC 4OZ STRL OR WHT (MISCELLANEOUS) ×4
COVER LIGHT HANDLE STERIS (MISCELLANEOUS) ×8 IMPLANT
COVER WAND RF STERILE (DRAPES) ×5 IMPLANT
DERMABOND ADVANCED (GAUZE/BANDAGES/DRESSINGS) ×1
DERMABOND ADVANCED .7 DNX12 (GAUZE/BANDAGES/DRESSINGS) ×3 IMPLANT
DRAPE WARM FLUID 44X44 (DRAPES) ×4 IMPLANT
DRSG OPSITE POSTOP 4X8 (GAUZE/BANDAGES/DRESSINGS) ×4 IMPLANT
ELECT REM PT RETURN 9FT ADLT (ELECTROSURGICAL) ×4
ELECTRODE REM PT RTRN 9FT ADLT (ELECTROSURGICAL) ×3 IMPLANT
EVACUATOR DRAINAGE 10X20 100CC (DRAIN) ×2 IMPLANT
EVACUATOR SILICONE 100CC (DRAIN) ×4
GAUZE 4X4 16PLY RFD (DISPOSABLE) ×4 IMPLANT
GLOVE ECLIPSE 8.0 STRL XLNG CF (GLOVE) ×4 IMPLANT
GLOVE SRG 8 PF TXTR STRL LF DI (GLOVE) ×3 IMPLANT
GLOVE SURG UNDER POLY LF SZ7 (GLOVE) ×16 IMPLANT
GLOVE SURG UNDER POLY LF SZ8 (GLOVE) ×4
GOWN STRL REUS W/TWL LRG LVL3 (GOWN DISPOSABLE) ×8 IMPLANT
GOWN STRL REUS W/TWL XL LVL3 (GOWN DISPOSABLE) ×4 IMPLANT
HEMOSTAT ARISTA ABSORB 3G PWDR (HEMOSTASIS) ×3 IMPLANT
INST SET MAJOR GENERAL (KITS) ×4 IMPLANT
KIT BLADEGUARD II DBL (SET/KITS/TRAYS/PACK) ×4 IMPLANT
KIT TURNOVER KIT A (KITS) ×4 IMPLANT
MANIFOLD NEPTUNE II (INSTRUMENTS) ×4 IMPLANT
NDL HYPO 18GX1.5 BLUNT FILL (NEEDLE) ×1 IMPLANT
NDL HYPO 21X1.5 SAFETY (NEEDLE) ×1 IMPLANT
NEEDLE HYPO 18GX1.5 BLUNT FILL (NEEDLE) IMPLANT
NEEDLE HYPO 21X1.5 SAFETY (NEEDLE) ×4 IMPLANT
NS IRRIG 1000ML POUR BTL (IV SOLUTION) ×20 IMPLANT
PACK MAJOR ABDOMINAL (CUSTOM PROCEDURE TRAY) ×4 IMPLANT
PAD ARMBOARD 7.5X6 YLW CONV (MISCELLANEOUS) ×10 IMPLANT
PENCIL SMOKE EVACUATOR (MISCELLANEOUS) ×4 IMPLANT
RETRACTOR WND ALEXIS-O 25 LRG (MISCELLANEOUS) ×2 IMPLANT
RETRACTOR WOUND ALXS 18CM MED (MISCELLANEOUS) ×2 IMPLANT
RTRCTR WOUND ALEXIS O 18CM MED (MISCELLANEOUS) ×4
RTRCTR WOUND ALEXIS O 25CM LRG (MISCELLANEOUS) ×4
SET BASIN LINEN APH (SET/KITS/TRAYS/PACK) ×4 IMPLANT
SPONGE DRAIN TRACH 4X4 STRL 2S (GAUZE/BANDAGES/DRESSINGS) ×3 IMPLANT
SPONGE LAP 18X18 RF (DISPOSABLE) ×23 IMPLANT
SUT CHROMIC 0 CT 1 (SUTURE) ×5 IMPLANT
SUT ETHILON 3 0 FSL (SUTURE) ×3 IMPLANT
SUT MON AB 3-0 SH 27 (SUTURE) ×4 IMPLANT
SUT PLAIN 2 0 XLH (SUTURE) IMPLANT
SUT VIC AB 0 CT1 27 (SUTURE) ×16
SUT VIC AB 0 CT1 27XBRD ANTBC (SUTURE) IMPLANT
SUT VIC AB 0 CT1 27XCR 8 STRN (SUTURE) ×10 IMPLANT
SUT VIC AB 0 CTX 36 (SUTURE) ×4
SUT VIC AB 0 CTX36XBRD ANTBCTR (SUTURE) ×3 IMPLANT
SUT VICRYL 3 0 (SUTURE) ×4 IMPLANT
SYR 10ML LL (SYRINGE) ×3 IMPLANT
SYR 20ML LL LF (SYRINGE) ×5 IMPLANT
SYR BULB IRRIG 60ML STRL (SYRINGE) ×4 IMPLANT
TOWEL SURG RFD BLUE STRL DISP (DISPOSABLE) ×4 IMPLANT
TRAY FOLEY MTR SLVR 16FR STAT (SET/KITS/TRAYS/PACK) ×4 IMPLANT

## 2021-02-14 NOTE — Progress Notes (Signed)
Pharmacy Antibiotic Note  Barbara Rogers is a 40 y.o. female admitted on 02/14/2021 with TOA.  Pharmacy has been consulted for Gentamicin dosing. Patient s/p Total abdominal hysterectomy with bilateral salpingo-oophorectomy 6/8 and has a large tubo-ovarian abscess.   Plan: Gentamicin 5mg /kg ABW (350mg ) IV q24h F/U cxs and clinical progress Monitor V/S and levels as indicated  Height: 5\' 7"  (170.2 cm) Weight: 84.8 kg (187 lb) IBW/kg (Calculated) : 61.6  Temp (24hrs), Avg:98.1 F (36.7 C), Min:97.8 F (36.6 C), Max:98.3 F (36.8 C)  Recent Labs  Lab 02/12/21 1552  WBC 17.9*  CREATININE 0.95    Estimated Creatinine Clearance: 89 mL/min (by C-G formula based on SCr of 0.95 mg/dL).    Allergies  Allergen Reactions  . Penicillins Other (See Comments)    Unknown Has patient had a PCN reaction causing immediate rash, facial/tongue/throat swelling, SOB or lightheadedness with hypotension:  unknown Has patient had a PCN reaction causing severe rash involving mucus membranes or skin necrosis:  unknown Has patient had a PCN reaction that required hospitalization  unknown Has patient had a PCN reaction occurring within the last 10 years: unknown If all of the above answers are "NO", the  . Progestins     Extreme moodiness  . Sinequan [Doxepin] Other (See Comments) and Hypertension    Hallucinations, delusions, severe agitation    Antimicrobials this admission: Gentamicin 6/8 >>  Microbiology results: 6/8 UCx: pending   Thank you for allowing pharmacy to be a part of this patient's care.  Isac Sarna, BS Pharm D, BCPS Clinical Pharmacist Pager [phone] 02/14/2021 1:05 PM

## 2021-02-14 NOTE — Progress Notes (Signed)
Pt arrived to room via bed from PACU at 1240. Pt drowsy but awake, oriented x4. Complains of pain in lower abd but then drifts off to sleep. Able to move self in bed and reposition for comfort. IV site WNL to right AC. Foley cath intact draining dark yellow urine. Honeycomb dressing intact to lower abd, no drainage noted. JP drain intact to bulb suction with serosanguinous drainage noted. VSS. Call bell in reach and pt instructed on use. Bed alarm on for safety while pt remains drowsy post op.

## 2021-02-14 NOTE — Progress Notes (Signed)
Pt has vomited x2 moderate amounts of undigested food. Not time for ordered antiemetics. MD Eure notified.

## 2021-02-14 NOTE — Anesthesia Procedure Notes (Signed)
Procedure Name: Intubation Date/Time: 02/14/2021 7:34 AM Performed by: Riki Sheer, CRNA Pre-anesthesia Checklist: Patient identified, Emergency Drugs available, Suction available, Patient being monitored and Timeout performed Patient Re-evaluated:Patient Re-evaluated prior to induction Oxygen Delivery Method: Circle system utilized Preoxygenation: Pre-oxygenation with 100% oxygen Induction Type: IV induction Ventilation: Mask ventilation without difficulty Laryngoscope Size: Miller and 2 Grade View: Grade I Tube type: Oral Tube size: 7.0 mm Number of attempts: 1 Airway Equipment and Method: Stylet Placement Confirmation: ETT inserted through vocal cords under direct vision,  positive ETCO2,  CO2 detector and breath sounds checked- equal and bilateral Secured at: 22 cm Tube secured with: Tape Dental Injury: Teeth and Oropharynx as per pre-operative assessment  Comments: Pt head and neck remained stabilized in position of pt stated comfort during intubation.

## 2021-02-14 NOTE — Progress Notes (Signed)
Pt continues with episodes of vomiting, moderate amount undigested food. No response from MD, contacted office to update MD with no response, called MD cell and orders obtained.  Pt up OOB into recliner with assistance, tolerated well. C/o pain in back (chronic) and states bed is really uncomfortable. Pt has small amount of vaginal bleeding noted on peripad. JP drain milked and emptied of 60 ml of sanguinous drainage.

## 2021-02-14 NOTE — Anesthesia Preprocedure Evaluation (Signed)
Anesthesia Evaluation  Patient identified by MRN, date of birth, ID band Patient awake    Reviewed: Allergy & Precautions, NPO status , Patient's Chart, lab work & pertinent test results  Airway Mallampati: II  TM Distance: >3 FB Neck ROM: Full   Comment: Chronic neck pain, cervical radiculopathy C 3 transverse fx Dental  (+) Dental Advisory Given, Chipped   Pulmonary shortness of breath and with exertion, asthma , pneumonia, Current Smoker and Patient abstained from smoking.,  Traumatic hemopneumothorax    Pulmonary exam normal breath sounds clear to auscultation       Cardiovascular Exercise Tolerance: Good Normal cardiovascular exam Rhythm:Regular Rate:Normal     Neuro/Psych  Headaches, Seizures - (seizure like activity ),  PSYCHIATRIC DISORDERS Anxiety Depression Schizophrenia Short term memory loss    GI/Hepatic GERD  Medicated and Controlled,(+)     substance abuse  marijuana use,   Endo/Other  Hypothyroidism   Renal/GU Renal disease     Musculoskeletal Clavicle fracture  Lumbar transverse process fracture (HCC  Cervical transverse process fracture (HCC)   Abdominal   Peds  Hematology negative hematology ROS (+)   Anesthesia Other Findings Mood disorder (HCC) AR (allergic rhinitis) Dysmenorrhea PMDD (premenstrual dysphoric disorder) Chronic insomnia Chronic left shoulder pain Hypothyroid IUD (intrauterine device) in place Migraine without aura Hypokalemia Tobacco abuse Suicidal ideation Bradycardia MDD (major depressive disorder), recurrent severe, without psychosis (HCC) Seizure-like activity (HCC) Polysubstance (excluding opioids) dependence, daily use (HCC) Chronic back pain greater than 3 months duration Multiple falls Short-term memory loss GERD (gastroesophageal reflux disease) Painful orthopaedic hardware (HCC)     Reproductive/Obstetrics negative OB ROS                             Anesthesia Physical Anesthesia Plan  ASA: III  Anesthesia Plan: General   Post-op Pain Management:    Induction: Intravenous  PONV Risk Score and Plan: 4 or greater and Ondansetron, Dexamethasone, Midazolam and Scopolamine patch - Pre-op  Airway Management Planned: Oral ETT  Additional Equipment:   Intra-op Plan:   Post-operative Plan: Extubation in OR  Informed Consent: I have reviewed the patients History and Physical, chart, labs and discussed the procedure including the risks, benefits and alternatives for the proposed anesthesia with the patient or authorized representative who has indicated his/her understanding and acceptance.     Dental advisory given  Plan Discussed with: CRNA and Surgeon  Anesthesia Plan Comments:        Anesthesia Quick Evaluation

## 2021-02-14 NOTE — Anesthesia Postprocedure Evaluation (Signed)
Anesthesia Post Note  Patient: Barbara Rogers  Procedure(s) Performed: OPEN TOTAL ABDOMINAL HYSTERECTOMY WITH BILATERAL SALPINGO-OOPHORECTOMY (Bilateral Abdomen)  Patient location during evaluation: PACU Anesthesia Type: General Level of consciousness: awake and alert, oriented and sedated Pain management: pain level controlled Vital Signs Assessment: post-procedure vital signs reviewed and stable Respiratory status: spontaneous breathing and respiratory function stable Cardiovascular status: blood pressure returned to baseline and stable Postop Assessment: no apparent nausea or vomiting Anesthetic complications: no   No complications documented.   Last Vitals:  Vitals:   02/14/21 1215 02/14/21 1252  BP: 123/82 121/78  Pulse: 86 88  Resp: 17 18  Temp:  36.8 C  SpO2: 96% 95%    Last Pain:  Vitals:   02/14/21 1252  TempSrc: Oral  PainSc:                  Emilyrose Darrah C Aniyla Harling

## 2021-02-14 NOTE — Transfer of Care (Signed)
Immediate Anesthesia Transfer of Care Note  Patient: Barbara Rogers  Procedure(s) Performed: OPEN TOTAL ABDOMINAL HYSTERECTOMY WITH BILATERAL SALPINGO-OOPHORECTOMY (Bilateral Abdomen)  Patient Location: PACU  Anesthesia Type:General  Level of Consciousness: awake and alert   Airway & Oxygen Therapy: Patient Spontanous Breathing and Patient connected to nasal cannula oxygen  Post-op Assessment: Report given to RN and Post -op Vital signs reviewed and stable  Post vital signs: Reviewed and stable  Last Vitals:  Vitals Value Taken Time  BP 110/73 02/14/21 1021  Temp    Pulse 84 02/14/21 1021  Resp 16 02/14/21 1021  SpO2 90 % 02/14/21 1021  Vitals shown include unvalidated device data.  Last Pain:  Vitals:   02/14/21 0650  TempSrc: Oral  PainSc: 6          Complications: No complications documented.

## 2021-02-14 NOTE — Plan of Care (Signed)

## 2021-02-14 NOTE — Op Note (Signed)
Preoperative diagnosis: 14-week size fibroid uterus                                         Abnormal uterine bleeding due to 14-week size fibroid uterus                                         Dysmenorrhea with chronic pelvic pain                                         Unresponsive abnormal uterine bleeding to conservative hormonal                                                Methods  Post operative diagnosis: Same as above plus bilateral large tubo-ovarian abscesses and a right broad ligament myoma  Procedure: Total abdominal hysterectomy with bilateral salpingo-oophorectomy  Surgeon:  Florian Buff, MD  Anesthesia: General endotracheal  Findings: She was known to have a 14-week size fibroid uterus and we were unable to control her bleeding with conservative hormonal methods.  She also had a lot of dysmenorrhea and some degree of chronic pelvic pain and dyspareunia.  As a result, decision was made a few months ago to proceed with total abdominal hysterectomy with bilateral salpingectomy.  However the decision for timing of surgery was delayed because of COVID shutdown and then the creased number of cases it occurred as a result of that over the last several months.  Interestingly we noted this morning her reoperative lab work white count was 17,900 differential had not been performed.  The patient was without complaints and did not complain of any recent fever says she had a urinary tract infection in the last couple weeks.  Intraoperatively, unexpectedly she had large bilateral tubo-ovarian abscesses.  Also interestingly there was not a lot of fibrinous exudate which would indicate chronicity of this process which makes me think it was more acute.  The other reason I think it was probably more acute rather than chronic was a large amount of purulent fluid which was malodorous. The tubes and ovaries were essentially unrecognizable.  They were densely adherent to the pelvic sidewalls bilaterally  as well as the sigmoid colon on the left.  I made the decision at that point that was in the patient's best interest that not only the tubes but the ovaries will be removed as they were completely engulfed in the tubo-ovarian abscess process and would have gone on to cause chronic pain sure in the very near future would have required surgical removal indeed she was able to clear the infection postoperatively.  The appendix was secondarily in the ovarian abscess on the right but I freed it up and guided out of the purulent exudate looked much better and was not concerning for a primary or secondary appendicitis.  Also interestingly and unfortunately the fibroid that was dominant and palpable on exam was a right broad ligament fibroid which may dissection more difficult addition to the disrupted tissue planes and anatomy of the tubo-ovarian abscesses.  Besides the fibroids  the uterus also looks normal as well as the cervix.  Besides intraperitoneal tubo-ovarian abscesses and injected peritoneum all other peritoneal surfaces sigmoid colon cecum appendix etc. all appeared to be normal  Description of operation: The patient was taken to the operating room placed in the supine position where she underwent general endotracheal anesthesia She remained in the supine position and she was prepped and draped in the usual sterile fashion for a Pfannenstiel skin incision. A Foley catheter was placed. An 8 cm incision was made just above the pubic bone the preoperative plan was a mini lap incision This incision was carried down sharply to the rectus fascia The fascia was scored in the midline Fascial incisions were extended laterally The fascia was taken off the muscles superiorly and inferiorly without difficulty The rectus muscles were divided manually The peritoneum was entered without difficulty Upon entry into the peritoneum there was a large amount of purulent malodorous fluid and immediately identified that  the patient had bilateral tubo-ovarian abscesses At this point I knew that the 8 cm mini lap incision would be an adequate and so I extended the incision to a traditional Pfannenstiel albeit somewhat lower than the traditional location The large amount of malodorous purulent fluid was suction removed from the peritoneal cavity and a large amount of irrigation was performed at this point The upper abdomen was packed away Attention was turned to the adnexa and I manually dissected tubo-ovarian abscess processes off of the lateral pelvic sidewalls bilaterally Additionally the left tubo-ovarian abscess was manually dissected off of the sigmoid colon without difficulty The right tubo-ovarian abscess was not adherent to the appendix however the appendix was secondarily in the purulent fluid but I did not see any evidence of a primary or secondary appendicitis The appendix was injected otherwise normal At this point and made the decision that it would have been inappropriate to salvage to the ovaries Even if she had been able to heal up postoperatively which is a big if I think she would have had chronic pain due to adhesions from the ovaries being densely adherent To the pelvic sidewalls and surrounding pelvic structures.  This would have made it much more difficult for removal down the road and potentially resulting in sigmoid injury For all these reasons I decided that again it was in the patient's best interest to have both tubes and ovaries removed All of the tissue was very indurated and woody like and it to balsa wood making plane delineation dissection clamping and suturing challenging I placed a Kocher clamp medially at the utero-ovarian ligament level and crossclamped the tube and ovary blood supply infundibulopelvic ligament and a very flat plane under the ovary on the right I was not able to do this with 1 clamp so I did 2 Haney type suture ligations for this pedicle After the manual dissection  of the left upper carried out the same procedure on the left I then sutured the right round ligament and cut it using electrocautery unit gaining access to the retroperitoneal space Of course retroperitoneal space being uninvolved with this process however it was then that I discovered that the fibroid that was noted preoperatively on exam was indeed out in the right broad ligament I dissected around the entire myoma and delivered it through the broad ligament defect I then placed a curved Heaney clamp below the level of this fibroid as it was delivered up in order to have the uterine blood supply Was well medial off the pelvic  sidewall at this point to the dissection and delivery of the fibroid from the broad ligament area I then was able to clamp medially along the uterine cornu on the left Each pedicle was clamped cut and transfixion suture ligated I dissected the bladder off with some the difficulty as it was course indurated from the purulent fluid The left uterine vessels were then clamped cut and suture-ligated I then took several pedicles down the cervix bilaterally staying medial to the uterine vessels Pedicle was clamped cut and transfixion suture ligated I then reached the level of the cervix and clamped from the left to the right disc below the cervix Each 1 of these pedicles were clamped cut and transfixion suture ligated with good hemostasis I then laced 1 clamp at the vaginal cervical angle in the right and remove the specimen Vaginal angle suture was placed in a transfixion manner There was good hemostasis of all the pedicles vigorous pelvic irrigation was performed throughout the procedure a total of 3200 cc irrigation was used during the procedure The patient never showed any hemodynamic evidence of sepsis but I encouraged a large amount of intravenous fluid intraoperatively to hopefully mitigate that clinical issue She received 3000 cc of LR intraoperatively Estimated blood loss  was 400 cc I placed Arista in the pelvis on the indurated woody pedicles They were all hemostatic Placed a JP drain in the pelvis and exited it in the left lower quadrant It was affixed with a single 3-0 Ethilon suture The muscles and peritoneum were closed 3 cc of Zynrelef was placed underneath the fascia on top of the rectus muscles at this level The rectus fascia was closed using 0 Vicryl running 3 cc of Zynrelef was placed on top of the fascial closure Subcutaneous tissue had been made hemostatic and irrigated prepreviously Hemostasis was confirmed The skin was closed using 3-0 Vicryl on a Keith needle in a subcuticular fashion without difficulty Dermabond was placed for additional wound integrity A honeycomb dressing was placed  The patient overall tolerated procedure well despite the surprise of bilateral tubo-ovarian abscesses  Again she experienced 400 cc of blood loss She had 200 cc of urine output intraoperatively 3200 cc of fluid was used for irrigation She received 3000 cc of LR intraoperatively JP drain is exiting the left lower quadrant She received clindamycin and gentamicin preoperatively prophylactically She received 30 mg of Toradol preoperatively as well  She is awakened from anesthesia taken to recovery in good stable condition all counts correct x3  She will be kept in the hospital and undergo a minimum of 72 hours of IV antibiotics and transition for at least 24 hours in-house and oral antibiotics but with this degree of peritoneal infection I would anticipate a longer hospitalization possibly  Florian Buff, MD 02/14/2021 10:33 AM

## 2021-02-14 NOTE — H&P (Signed)
Preoperative History and Physical  Barbara Rogers is a 40 y.o. G3P0030 with No LMP recorded. (Menstrual status: IUD). admitted for a abdominal hysterectomy.   40yo female who presents to discuss AUB.  Previously tried several types of hormonal options included OCPs, progesterone only pills and Mirena with minimal success. Paragard in place Last lupron 10/06/20 Uterus enlarged 14 weeks size on exam with palpable fibroid  PMH:    Past Medical History:  Diagnosis Date  . AKI (acute kidney injury) ([place]) 01/18/2015  . Allergy   . Anxiety   . Asthma    exacerbated by bronchitisi  . Asthma   . C3 cervical fracture ([place])   . C3 cervical fracture ([place]) 01/29/2016  . Cervical transverse process fracture ([place])   . Chronic left shoulder pain 08/2012  . Clavicle fracture   . Concussion 01/29/2016  . Depression   . 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 Dysmenorrhea   . Dysphagia   . Dyspnea   . Finger fracture, left 10/31/2012   LEFT 4th finger  . Insomnia   . Lumbar transverse process fracture ([place])   . MDD (major depressive disorder), recurrent episode, severe ([place]) 06/30/2016  . 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 Migraine without aura   . Mood disorder ([place])   . Nephrolithiasis   . OD (overdose of drug), intentional self-harm, initial encounter ([place]) 06/26/2016  . PMDD (premenstrual dysphoric disorder)   . Pneumonia   . Respiratory failure ([place])   . Seizure-like activity ([place]) 06/30/2016   normal EEG, question pseudoseizures  . T12 burst fracture ([place])   . Traumatic hemo-pneumothorax   . Traumatic hemopneumothorax 02/09/2016    PSH:     Past Surgical History:  Procedure Laterality Date  . FRACTURE SURGERY    . HARDWARE REMOVAL N/A 04/07/2019   Procedure: Removal of Thoracolumbar hardware;  Surgeon: Kary Kos, MD;  Location: [facility];  Service: Neurosurgery;  Laterality: N/A;  Removal of Thoracolumbar hardware  .  POSTERIOR LUMBAR FUSION 4 LEVEL N/A 01/30/2016   Procedure: T9-L2 Posterior Stabilization, Posterior Lumbar Fusion with Pedicle Screws;  Surgeon: Kary Kos, MD;  Location: [facility];  Service: Neurosurgery;  Laterality: N/A;    POb/GynH:      OB History    Gravida  3   Para  0   Term  0   Preterm  0   AB  3   Living        SAB  0   IAB  3   Ectopic  0   Multiple      Live Births              SH:   Social History   Tobacco Use  . Smoking status: Current Every Day Smoker    Packs/day: 1.00    Types: Cigarettes  . Smokeless tobacco: Never Used  Vaping Use  . Vaping Use: Never used  Substance Use Topics  . Alcohol use: Yes    Comment: occassionally  . Drug use: Yes    Frequency: 1.0 times per week    Types: Marijuana    Comment: Neg UDS    FH:    Family History  Problem Relation Age of Onset  . Migraines Mother   . Mental illness Mother   . Heart disease Father   . Alcohol abuse Father   . Arthritis Maternal Grandmother   . Thyroid disease Maternal Grandmother   . Heart disease  Maternal Grandfather        AMI 1996, 2014  . Anemia Maternal Grandfather        bone marrow dysfunction     Allergies:  Allergies  Allergen Reactions  . Penicillins Other (See Comments)    Unknown Has patient had a PCN reaction causing immediate rash, facial/tongue/throat swelling, SOB or lightheadedness with hypotension:  unknown Has patient had a PCN reaction causing severe rash involving mucus membranes or skin necrosis:  unknown Has patient had a PCN reaction that required hospitalization  unknown Has patient had a PCN reaction occurring within the last 10 years: unknown If all of the above answers are "NO", the  . Progestins     Extreme moodiness  . Sinequan [Doxepin] Other (See Comments) and Hypertension    Hallucinations, delusions, severe agitation    Medications:       Current Facility-Administered Medications:  .  bupivacaine-meloxicam ER  (ZYNRELEF) injection 300 mg, 300 mg, Infiltration, Once, Dianelly Ferran, Mertie Clause, MD .  clindamycin (CLEOCIN) IVPB 900 mg, 900 mg, Intravenous, 60 min Pre-Op **AND** gentamicin (GARAMYCIN) 350 mg in dextrose 5 % 100 mL IVPB, 5 mg/kg (Adjusted), Intravenous, 60 min Pre-Op, Idonna Heeren, Mertie Clause, MD .  gabapentin (NEURONTIN) capsule 300 mg, 300 mg, Oral, Once, Battula, Rajamani C, MD .  ketorolac (TORADOL) 30 MG/ML injection 30 mg, 30 mg, Intravenous, Once, Florian Buff, MD .  lactated ringers infusion, , Intravenous, Continuous, Battula, Rajamani C, MD .  scopolamine (TRANSDERM-SCOP) 1 MG/3DAYS 1.5 mg, 1 patch, Transdermal, Once, Battula, Rajamani C, MD, 1.5 mg at 02/14/21 4970  Review of Systems:   Review of Systems  Constitutional: Negative for fever, chills, weight loss, malaise/fatigue and diaphoresis.  HENT: Negative for hearing loss, ear pain, nosebleeds, congestion, sore throat, neck pain, tinnitus and ear discharge.   Eyes: Negative for blurred vision, double vision, photophobia, pain, discharge and redness.  Respiratory: Negative for cough, hemoptysis, sputum production, shortness of breath, wheezing and stridor.   Cardiovascular: Negative for chest pain, palpitations, orthopnea, claudication, leg swelling and PND.  Gastrointestinal: Positive for abdominal pain. Negative for heartburn, nausea, vomiting, diarrhea, constipation, blood in stool and melena.  Genitourinary: Negative for dysuria, urgency, frequency, hematuria and flank pain.  Musculoskeletal: Negative for myalgias, back pain, joint pain and falls.  Skin: Negative for itching and rash.  Neurological: Negative for dizziness, tingling, tremors, sensory change, speech change, focal weakness, seizures, loss of consciousness, weakness and headaches.  Endo/Heme/Allergies: Negative for environmental allergies and polydipsia. Does not bruise/bleed easily.  Psychiatric/Behavioral: Negative for depression, suicidal ideas, hallucinations, memory loss  and substance abuse. The patient is not nervous/anxious and does not have insomnia.      PHYSICAL EXAM:  Blood pressure 90/65, pulse 94, temperature 98.2 F (36.8 C), temperature source Oral, resp. rate 18, height 5\' 7"  (1.702 m), weight 84.8 kg, SpO2 93 %.    Vitals reviewed. Constitutional: She is oriented to person, place, and time. She appears well-developed and well-nourished.  HENT:  Head: Normocephalic and atraumatic.  Right Ear: External ear normal.  Left Ear: External ear normal.  Nose: Nose normal.  Mouth/Throat: Oropharynx is clear and moist.  Eyes: Conjunctivae and EOM are normal. Pupils are equal, round, and reactive to light. Right eye exhibits no discharge. Left eye exhibits no discharge. No scleral icterus.  Neck: Normal range of motion. Neck supple. No tracheal deviation present. No thyromegaly present.  Cardiovascular: Normal rate, regular rhythm, normal heart sounds and intact distal pulses.  Exam reveals no gallop  and no friction rub.   No murmur heard. Respiratory: Effort normal and breath sounds normal. No respiratory distress. She has no wheezes. She has no rales. She exhibits no tenderness.  GI: Soft. Bowel sounds are normal. She exhibits no distension and no mass. There is tenderness. There is no rebound and no guarding.  Genitourinary:       Vulva is normal without lesions Vagina is pink moist without discharge Cervix normal in appearance and pap is normal Uterus is 14 weeks size on exam Adnexa is negative with normal sized ovaries by sonogram  Musculoskeletal: Normal range of motion. She exhibits no edema and no tenderness.  Neurological: She is alert and oriented to person, place, and time. She has normal reflexes. She displays normal reflexes. No cranial nerve deficit. She exhibits normal muscle tone. Coordination normal.  Skin: Skin is warm and dry. No rash noted. No erythema. No pallor.  Psychiatric: She has a normal mood and affect. Her behavior is  normal. Judgment and thought content normal.    Labs: Results for orders placed or performed during the hospital encounter of 02/12/21 (from the past 336 hour(s))  CBC   Collection Time: 02/12/21  3:52 PM  Result Value Ref Range   WBC 17.9 (H) 4.0 - 10.5 K/uL   RBC 4.76 3.87 - 5.11 MIL/uL   Hemoglobin 14.7 12.0 - 15.0 g/dL   HCT 45.1 36.0 - 46.0 %   MCV 94.7 80.0 - 100.0 fL   MCH 30.9 26.0 - 34.0 pg   MCHC 32.6 30.0 - 36.0 g/dL   RDW 13.7 11.5 - 15.5 %   Platelets 285 150 - 400 K/uL   nRBC 0.0 0.0 - 0.2 %  Comprehensive metabolic panel   Collection Time: 02/12/21  3:52 PM  Result Value Ref Range   Sodium 136 135 - 145 mmol/L   Potassium 3.6 3.5 - 5.1 mmol/L   Chloride 103 98 - 111 mmol/L   CO2 25 22 - 32 mmol/L   Glucose, Bld 137 (H) 70 - 99 mg/dL   BUN 16 6 - 20 mg/dL   Creatinine, Ser 0.95 0.44 - 1.00 mg/dL   Calcium 8.9 8.9 - 10.3 mg/dL   Total Protein 8.2 (H) 6.5 - 8.1 g/dL   Albumin 3.7 3.5 - 5.0 g/dL   AST 18 15 - 41 U/L   ALT 15 0 - 44 U/L   Alkaline Phosphatase 139 (H) 38 - 126 U/L   Total Bilirubin 0.8 0.3 - 1.2 mg/dL   GFR, Estimated >60 >60 mL/min   Anion gap 8 5 - 15  hCG, quantitative, pregnancy   Collection Time: 02/12/21  3:52 PM  Result Value Ref Range   hCG, Beta Chain, Quant, S 1 <5 mIU/mL  Type and screen   Collection Time: 02/12/21  3:52 PM  Result Value Ref Range   ABO/RH(D) O POS    Antibody Screen NEG    Sample Expiration 02/26/2021,2359    Extend sample reason      NO TRANSFUSIONS OR PREGNANCY IN THE PAST 3 MONTHS Performed at [facility], [address], [place], [place] [address]   Rapid HIV screen (HIV 1/2 Ab+Ag)   Collection Time: 02/12/21  3:53 PM  Result Value Ref Range   HIV-1 P24 Antigen - HIV24 NON REACTIVE NON REACTIVE   HIV 1/2 Antibodies NON REACTIVE NON REACTIVE   Interpretation (HIV Ag Ab)      A non reactive test result means that HIV 1 or HIV 2  antibodies and HIV 1 p24 antigen were not detected in the specimen.   Urinalysis, Routine w reflex microscopic Urine, Clean Catch   Collection Time: 02/12/21  3:53 PM  Result Value Ref Range   Color, Urine YELLOW YELLOW   APPearance CLOUDY (A) CLEAR   Specific Gravity, Urine 1.019 1.005 - 1.030   pH 5.0 5.0 - 8.0   Glucose, UA NEGATIVE NEGATIVE mg/dL   Hgb urine dipstick MODERATE (A) NEGATIVE   Bilirubin Urine NEGATIVE NEGATIVE   Ketones, ur 5 (A) NEGATIVE mg/dL   Protein, ur 100 (A) NEGATIVE mg/dL   Nitrite NEGATIVE NEGATIVE   Leukocytes,Ua LARGE (A) NEGATIVE   RBC / HPF 6-10 0 - 5 RBC/hpf   WBC, UA >50 (H) 0 - 5 WBC/hpf   Bacteria, UA MANY (A) NONE SEEN   Squamous Epithelial / LPF 0-5 0 - 5   WBC Clumps PRESENT    Mucus PRESENT    Non Squamous Epithelial 0-5 (A) NONE SEEN    EKG: Orders placed or performed during the hospital encounter of 02/12/21  . EKG 12-Lead  . EKG 12-Lead    Imaging Studies: No results found.    Assessment: 14 weeks size uterus, fibroids AUB dysmenorrhea  Plan: Abdominal hysterectomy, mini lap  Pt understands the risks of surgery including but not limited t  excessive bleeding requiring transfusion or reoperation, post-operative infection requiring prolonged hospitalization or re-hospitalization and antibiotic therapy, and damage to other organs including bladder, bowel, ureters and major vessels.  The patient also understands the alternative treatment options which were discussed in full.  All questions were answered.  Florian Buff 02/14/2021 7:09 AM   Florian Buff 02/14/2021 7:06 AM

## 2021-02-15 ENCOUNTER — Encounter (HOSPITAL_COMMUNITY): Payer: Self-pay | Admitting: Obstetrics & Gynecology

## 2021-02-15 LAB — CBC
HCT: 31.1 % — ABNORMAL LOW (ref 36.0–46.0)
Hemoglobin: 10.4 g/dL — ABNORMAL LOW (ref 12.0–15.0)
MCH: 31.4 pg (ref 26.0–34.0)
MCHC: 33.4 g/dL (ref 30.0–36.0)
MCV: 94 fL (ref 80.0–100.0)
Platelets: 318 10*3/uL (ref 150–400)
RBC: 3.31 MIL/uL — ABNORMAL LOW (ref 3.87–5.11)
RDW: 14 % (ref 11.5–15.5)
WBC: 20.1 10*3/uL — ABNORMAL HIGH (ref 4.0–10.5)
nRBC: 0 % (ref 0.0–0.2)

## 2021-02-15 LAB — COMPREHENSIVE METABOLIC PANEL
ALT: 17 U/L (ref 0–44)
AST: 18 U/L (ref 15–41)
Albumin: 2.4 g/dL — ABNORMAL LOW (ref 3.5–5.0)
Alkaline Phosphatase: 113 U/L (ref 38–126)
Anion gap: 7 (ref 5–15)
BUN: 16 mg/dL (ref 6–20)
CO2: 25 mmol/L (ref 22–32)
Calcium: 7.8 mg/dL — ABNORMAL LOW (ref 8.9–10.3)
Chloride: 104 mmol/L (ref 98–111)
Creatinine, Ser: 0.6 mg/dL (ref 0.44–1.00)
GFR, Estimated: >60 mL/min (ref >60)
Glucose, Bld: 147 mg/dL — ABNORMAL HIGH (ref 70–99)
Potassium: 4.2 mmol/L (ref 3.5–5.1)
Sodium: 136 mmol/L (ref 135–145)
Total Bilirubin: 0.4 mg/dL (ref 0.3–1.2)
Total Protein: 6 g/dL — ABNORMAL LOW (ref 6.5–8.1)

## 2021-02-15 LAB — SURGICAL PATHOLOGY

## 2021-02-15 LAB — GENTAMICIN LEVEL, RANDOM: Gentamicin Rm: 3.1 ug/mL

## 2021-02-15 NOTE — Progress Notes (Signed)
Pharmacy Antibiotic Note  Barbara Rogers is a 40 y.o. female admitted on 02/14/2021 with  TOA .  Pharmacy has been consulted for Gentamicin dosing. Patient s/p Total abdominal hysterectomy with bilateral salpingo-oophorectomy 6/8 and has a large tubo-ovarian abscess.   Random gentamicin level 3.1 mcg/ml (~8 hrs post dose). Qualifies for the q24h dosing.  Plan: Gentamicin 5mg /kg ABW (350mg ) IV q24h F/U cxs and clinical progress  Height: 5\' 7"  (170.2 cm) Weight: 84.8 kg (187 lb) IBW/kg (Calculated) : 61.6  Temp (24hrs), Avg:98.1 F (36.7 C), Min:97.5 F (36.4 C), Max:98.8 F (37.1 C)  Recent Labs  Lab 02/12/21 1552 02/15/21 0521 02/15/21 1837  WBC 17.9* 20.1*  --   CREATININE 0.95 0.60  --   GENTRANDOM  --   --  3.1     Estimated Creatinine Clearance: 105.7 mL/min (by C-G formula based on SCr of 0.6 mg/dL).    Allergies  Allergen Reactions   Penicillins Other (See Comments)    Unknown Has patient had a PCN reaction causing immediate rash, facial/tongue/throat swelling, SOB or lightheadedness with hypotension:  unknown Has patient had a PCN reaction causing severe rash involving mucus membranes or skin necrosis:  unknown Has patient had a PCN reaction that required hospitalization  unknown Has patient had a PCN reaction occurring within the last 10 years: unknown If all of the above answers are "NO", the   Progestins     Extreme moodiness   Sinequan [Doxepin] Other (See Comments) and Hypertension    Hallucinations, delusions, severe agitation    Antimicrobials this admission: Gentamicin 6/8 >>  Microbiology results: 6/8 UCx: pending   Thank you for allowing pharmacy to be a part of this patient's care.  Sherlon Handing, PharmD, BCPS Please see amion for complete clinical pharmacist phone list 02/15/2021 10:52 PM

## 2021-02-15 NOTE — Progress Notes (Signed)
1 Day Post-Op Procedure(s) (LRB): OPEN TOTAL ABDOMINAL HYSTERECTOMY WITH BILATERAL SALPINGO-OOPHORECTOMY (Bilateral) for fibroids, unexpected bilateral TOAs  Subjective: Patient reports incisional pain, tolerating PO, and no problems voiding.    Objective: I have reviewed patient's vital signs, intake and output, medications, labs, and pathology.  General: alert, cooperative, and no distress GI: soft, non-tender; bowel sounds normal; no masses,  no organomegaly and incision: clean, dry, intact, and JP drain in place   Results for orders placed or performed during the hospital encounter of 02/14/21 (from the past 24 hour(s))  CBC     Status: Abnormal   Collection Time: 02/15/21  5:21 AM  Result Value Ref Range   WBC 20.1 (H) 4.0 - 10.5 K/uL   RBC 3.31 (L) 3.87 - 5.11 MIL/uL   Hemoglobin 10.4 (L) 12.0 - 15.0 g/dL   HCT 31.1 (L) 36.0 - 46.0 %   MCV 94.0 80.0 - 100.0 fL   MCH 31.4 26.0 - 34.0 pg   MCHC 33.4 30.0 - 36.0 g/dL   RDW 14.0 11.5 - 15.5 %   Platelets 318 150 - 400 K/uL   nRBC 0.0 0.0 - 0.2 %  Comprehensive metabolic panel     Status: Abnormal   Collection Time: 02/15/21  5:21 AM  Result Value Ref Range   Sodium 136 135 - 145 mmol/L   Potassium 4.2 3.5 - 5.1 mmol/L   Chloride 104 98 - 111 mmol/L   CO2 25 22 - 32 mmol/L   Glucose, Bld 147 (H) 70 - 99 mg/dL   BUN 16 6 - 20 mg/dL   Creatinine, Ser 0.60 0.44 - 1.00 mg/dL   Calcium 7.8 (L) 8.9 - 10.3 mg/dL   Total Protein 6.0 (L) 6.5 - 8.1 g/dL   Albumin 2.4 (L) 3.5 - 5.0 g/dL   AST 18 15 - 41 U/L   ALT 17 0 - 44 U/L   Alkaline Phosphatase 113 38 - 126 U/L   Total Bilirubin 0.4 0.3 - 1.2 mg/dL   GFR, Estimated >60 >60 mL/min   Anion gap 7 5 - 15     Assessment: s/p Procedure(s): OPEN TOTAL ABDOMINAL HYSTERECTOMY WITH BILATERAL SALPINGO-OOPHORECTOMY (Bilateral): stable and tolerating diet  Plan: Continue IV antibiotics minimum of 72 hours IV then switch to po With PCN allergy cannot use augmentin oral which would  be first choice Will probably use levaquin and flagyl as orals For now continue daily WBC and gent/clinda  LOS: 1 day    Florian Buff 02/15/2021, 10:04 AM

## 2021-02-15 NOTE — Plan of Care (Signed)

## 2021-02-16 LAB — CBC WITH DIFFERENTIAL/PLATELET
Abs Immature Granulocytes: 0.15 10*3/uL — ABNORMAL HIGH (ref 0.00–0.07)
Basophils Absolute: 0.1 10*3/uL (ref 0.0–0.1)
Basophils Relative: 0 %
Eosinophils Absolute: 0.1 10*3/uL (ref 0.0–0.5)
Eosinophils Relative: 1 %
HCT: 30.6 % — ABNORMAL LOW (ref 36.0–46.0)
Hemoglobin: 10.2 g/dL — ABNORMAL LOW (ref 12.0–15.0)
Immature Granulocytes: 1 %
Lymphocytes Relative: 15 %
Lymphs Abs: 2.5 10*3/uL (ref 0.7–4.0)
MCH: 30.8 pg (ref 26.0–34.0)
MCHC: 33.3 g/dL (ref 30.0–36.0)
MCV: 92.4 fL (ref 80.0–100.0)
Monocytes Absolute: 1.4 10*3/uL — ABNORMAL HIGH (ref 0.1–1.0)
Monocytes Relative: 8 %
Neutro Abs: 12.2 10*3/uL — ABNORMAL HIGH (ref 1.7–7.7)
Neutrophils Relative %: 75 %
Platelets: 356 10*3/uL (ref 150–400)
RBC: 3.31 MIL/uL — ABNORMAL LOW (ref 3.87–5.11)
RDW: 14.1 % (ref 11.5–15.5)
WBC: 16.4 10*3/uL — ABNORMAL HIGH (ref 4.0–10.5)
nRBC: 0 % (ref 0.0–0.2)

## 2021-02-16 LAB — URINE CULTURE: Culture: >=100000 — AB

## 2021-02-16 MED ORDER — GENTAMICIN SULFATE 40 MG/ML IJ SOLN
5.0000 mg/kg | INTRAVENOUS | Status: DC
  Administered 2021-02-16 – 2021-02-17 (×2): 350 mg via INTRAVENOUS
  Filled 2021-02-16 (×4): qty 8.75

## 2021-02-16 MED ORDER — HYDROMORPHONE HCL 2 MG PO TABS
2.0000 mg | ORAL_TABLET | ORAL | Status: DC | PRN
  Administered 2021-02-16 – 2021-02-19 (×19): 2 mg via ORAL
  Filled 2021-02-16 (×19): qty 1

## 2021-02-16 NOTE — Progress Notes (Signed)
Patient had vape pen in belongings. Made charge nurse and supervisor aware. Security placed vape pen in locking. Reported to on-coming nurse.

## 2021-02-16 NOTE — Progress Notes (Signed)
Dr. Elonda Husky returned call, new order to d/c oxy. Start Dilaudid 2mg  po 1-2 tabs q hrs prn.

## 2021-02-16 NOTE — Progress Notes (Signed)
Patient c/o of increasing pain this a.m.and wanting to be seen by MD asap this a.m. None of the pain medication given is alleviating pain. Attempted to chat Dr. Elonda Husky and page [phone] with no answer. Called office and had after hrs nurse page as well. Awaiting return call.

## 2021-02-17 MED ORDER — KCL IN DEXTROSE-NACL 20-5-0.45 MEQ/L-%-% IV SOLN: INTRAVENOUS | Status: AC

## 2021-02-17 MED ORDER — METRONIDAZOLE 500 MG PO TABS: 500.0000 mg | ORAL_TABLET | Freq: Three times a day (TID) | ORAL | Status: DC

## 2021-02-17 MED ORDER — LEVOFLOXACIN 750 MG PO TABS
750.0000 mg | ORAL_TABLET | Freq: Every day | ORAL | Status: DC
  Administered 2021-02-18 – 2021-02-19 (×2): 750 mg via ORAL
  Filled 2021-02-17 (×2): qty 1

## 2021-02-17 NOTE — Progress Notes (Signed)
3 Days Post-Op Procedure(s) (LRB): OPEN TOTAL ABDOMINAL HYSTERECTOMY WITH BILATERAL SALPINGO-OOPHORECTOMY (Bilateral)  Fibroids + bilateral TOAs  Subjective: Patient reports incisional pain, tolerating PO, and no problems voiding.    Objective: I have reviewed patient's vital signs, intake and output, medications, labs, and pathology.  General: alert, cooperative, and no distress GI: soft, non-tender; bowel sounds normal; no masses,  no organomegaly and incision: clean, dry, intact, and JP drain is in place  Assessment: s/p Procedure(s): OPEN TOTAL ABDOMINAL HYSTERECTOMY WITH BILATERAL SALPINGO-OOPHORECTOMY (Bilateral): stable, tolerating diet, and pain management continues to be a very difficult issue with this patient, she has a significant history of opioid dependence and is on suboxone, gabapentin and chronic muscle real=xant as well as several pshycotropic drugs  She understands she is on the strongest oral narcotic and is getting maximum dosing as well as her other meds  Plan: Continue current care Will change to oral antibiotics tomorrow Understands she needs to get up and ambulate in halls at least 4 times daily   LOS: 3 days    Florian Buff 02/17/2021, 10:49 PM

## 2021-02-17 NOTE — Progress Notes (Signed)
3 Days Post-Op Procedure(s) (LRB): OPEN TOTAL ABDOMINAL HYSTERECTOMY WITH BILATERAL SALPINGO-OOPHORECTOMY (Bilateral)   Fibroids + Bilateral TOA  Subjective: Patient reports incisional pain, tolerating PO, and no problems voiding.    Objective: I have reviewed patient's vital signs, intake and output, medications, labs, and pathology.  General: alert, cooperative, and no distress GI: soft, non-tender; bowel sounds normal; no masses,  no organomegaly and incision: clean, dry, intact, and JP drain is in place Vaginal Bleeding: none  Assessment: s/p Procedure(s): OPEN TOTAL ABDOMINAL HYSTERECTOMY WITH BILATERAL SALPINGO-OOPHORECTOMY (Bilateral): stable, progressing well, tolerating diet, and continue gent/clinda fro 72 hours minimum  Plan: Advance diet Encourage ambulation Advance to PO medication Transition to oral antibiotics 02/18/21 if clinically responding well  LOS: 3 days    Florian Buff

## 2021-02-17 NOTE — Progress Notes (Addendum)
Patient was informed yesterday regarding policy about vape pens. Patient had another vape pen in room. Made charge nurse aware. Security placed vape pen in locker.

## 2021-02-18 LAB — COMPREHENSIVE METABOLIC PANEL
ALT: 21 U/L (ref 0–44)
AST: 18 U/L (ref 15–41)
Albumin: 2.1 g/dL — ABNORMAL LOW (ref 3.5–5.0)
Alkaline Phosphatase: 146 U/L — ABNORMAL HIGH (ref 38–126)
Anion gap: 4 — ABNORMAL LOW (ref 5–15)
BUN: 8 mg/dL (ref 6–20)
CO2: 30 mmol/L (ref 22–32)
Calcium: 7.7 mg/dL — ABNORMAL LOW (ref 8.9–10.3)
Chloride: 101 mmol/L (ref 98–111)
Creatinine, Ser: 0.72 mg/dL (ref 0.44–1.00)
GFR, Estimated: >60 mL/min (ref >60)
Glucose, Bld: 115 mg/dL — ABNORMAL HIGH (ref 70–99)
Potassium: 4.2 mmol/L (ref 3.5–5.1)
Sodium: 135 mmol/L (ref 135–145)
Total Bilirubin: 0.5 mg/dL (ref 0.3–1.2)
Total Protein: 5.5 g/dL — ABNORMAL LOW (ref 6.5–8.1)

## 2021-02-18 LAB — CBC WITH DIFFERENTIAL/PLATELET
Abs Immature Granulocytes: 0.5 10*3/uL — ABNORMAL HIGH (ref 0.00–0.07)
Basophils Absolute: 0.1 10*3/uL (ref 0.0–0.1)
Basophils Relative: 0 %
Eosinophils Absolute: 0.3 10*3/uL (ref 0.0–0.5)
Eosinophils Relative: 2 %
HCT: 29.1 % — ABNORMAL LOW (ref 36.0–46.0)
Hemoglobin: 9.6 g/dL — ABNORMAL LOW (ref 12.0–15.0)
Immature Granulocytes: 3 %
Lymphocytes Relative: 10 %
Lymphs Abs: 2 10*3/uL (ref 0.7–4.0)
MCH: 30.8 pg (ref 26.0–34.0)
MCHC: 33 g/dL (ref 30.0–36.0)
MCV: 93.3 fL (ref 80.0–100.0)
Monocytes Absolute: 1.5 10*3/uL — ABNORMAL HIGH (ref 0.1–1.0)
Monocytes Relative: 8 %
Neutro Abs: 15 10*3/uL — ABNORMAL HIGH (ref 1.7–7.7)
Neutrophils Relative %: 77 %
Platelets: 367 10*3/uL (ref 150–400)
RBC: 3.12 MIL/uL — ABNORMAL LOW (ref 3.87–5.11)
RDW: 14.6 % (ref 11.5–15.5)
WBC: 19.4 10*3/uL — ABNORMAL HIGH (ref 4.0–10.5)
nRBC: 0 % (ref 0.0–0.2)

## 2021-02-18 MED ORDER — METRONIDAZOLE 500 MG PO TABS
500.0000 mg | ORAL_TABLET | Freq: Three times a day (TID) | ORAL | Status: DC
  Administered 2021-02-18 – 2021-02-19 (×5): 500 mg via ORAL
  Filled 2021-02-18 (×5): qty 1

## 2021-02-18 MED ORDER — NICOTINE 21 MG/24HR TD PT24
21.0000 mg | MEDICATED_PATCH | Freq: Every day | TRANSDERMAL | Status: DC
  Administered 2021-02-18: 21 mg via TRANSDERMAL
  Filled 2021-02-18 (×2): qty 1

## 2021-02-18 NOTE — Progress Notes (Signed)
Patient has been ambulating in hall without assistance. Has asked multiple times for Dilaudid and Xanax, both of which were given at 0851. The patient has been reminded of this several times to which her response is "it's nice to know you care about all these sick people except me" and stumbled back into her room. Nicotine patch was requested due to patient stating she smokes approximately 2 packs per day. Dr. Radford Pax made aware,will continue to monitor.

## 2021-02-18 NOTE — Progress Notes (Signed)
4 Days Post-Op Procedure(s) (LRB): OPEN TOTAL ABDOMINAL HYSTERECTOMY WITH BILATERAL SALPINGO-OOPHORECTOMY (Bilateral)  Fibroids + TOA  Subjective: Patient reports incisional pain, tolerating PO, + flatus, + BM, and no problems voiding.    Objective: I have reviewed patient's vital signs, intake and output, medications, labs, and pathology.  General: alert, cooperative, and no distress GI: normal findings: normal post op exam JP in place no erythema no distention noted and incision: clean, dry, and intact   CBC Latest Ref Rng & Units 02/18/2021 02/16/2021 02/15/2021  WBC 4.0 - 10.5 K/uL 19.4(H) 16.4(H) 20.1(H)  Hemoglobin 12.0 - 15.0 g/dL 9.6(L) 10.2(L) 10.4(L)  Hematocrit 36.0 - 46.0 % 29.1(L) 30.6(L) 31.1(L)  Platelets 150 - 400 K/uL 367 356 318    CMP Latest Ref Rng & Units 02/18/2021 02/15/2021 02/12/2021  Glucose 70 - 99 mg/dL 115(H) 147(H) 137(H)  BUN 6 - 20 mg/dL 8 16 16   Creatinine 0.44 - 1.00 mg/dL 0.72 0.60 0.95  Sodium 135 - 145 mmol/L 135 136 136  Potassium 3.5 - 5.1 mmol/L 4.2 4.2 3.6  Chloride 98 - 111 mmol/L 101 104 103  CO2 22 - 32 mmol/L 30 25 25   Calcium 8.9 - 10.3 mg/dL 7.7(L) 7.8(L) 8.9  Total Protein 6.5 - 8.1 g/dL 5.5(L) 6.0(L) 8.2(H)  Total Bilirubin 0.3 - 1.2 mg/dL 0.5 0.4 0.8  Alkaline Phos 38 - 126 U/L 146(H) 113 139(H)  AST 15 - 41 U/L 18 18 18   ALT 0 - 44 U/L 21 17 15     Assessment: s/p Procedure(s): OPEN TOTAL ABDOMINAL HYSTERECTOMY WITH BILATERAL SALPINGO-OOPHORECTOMY (Bilateral): progressing well  Plan: Pt pulled IV out x 2 during night, did not get the last gentamicin dose or clindamycin I was planning but received 4 days of each transitioned to oral levaquin/flagyl and if stable on those will go home tomorrow at lunchtime To keep JP in place for 2+ weeks since she had bilateral TOAs to continue to drain the pelvic space with cut across the vagina with repair, she knows she is a tsignificant risk for wound infection and cuff cellulitis/abscess post  op Emphasize importance of compliance with her antibiotics  LOS: 4 days    Florian Buff 02/18/2021, 5:01 PM

## 2021-02-19 ENCOUNTER — Other Ambulatory Visit: Payer: Self-pay | Admitting: Obstetrics & Gynecology

## 2021-02-19 ENCOUNTER — Telehealth: Payer: Self-pay | Admitting: Obstetrics & Gynecology

## 2021-02-19 LAB — CBC WITH DIFFERENTIAL/PLATELET
Abs Immature Granulocytes: 0.61 10*3/uL — ABNORMAL HIGH (ref 0.00–0.07)
Basophils Absolute: 0.1 10*3/uL (ref 0.0–0.1)
Basophils Relative: 0 %
Eosinophils Absolute: 0.2 10*3/uL (ref 0.0–0.5)
Eosinophils Relative: 1 %
HCT: 29.1 % — ABNORMAL LOW (ref 36.0–46.0)
Hemoglobin: 9.3 g/dL — ABNORMAL LOW (ref 12.0–15.0)
Immature Granulocytes: 3 %
Lymphocytes Relative: 9 %
Lymphs Abs: 1.8 10*3/uL (ref 0.7–4.0)
MCH: 30.1 pg (ref 26.0–34.0)
MCHC: 32 g/dL (ref 30.0–36.0)
MCV: 94.2 fL (ref 80.0–100.0)
Monocytes Absolute: 1.6 10*3/uL — ABNORMAL HIGH (ref 0.1–1.0)
Monocytes Relative: 8 %
Neutro Abs: 16.8 10*3/uL — ABNORMAL HIGH (ref 1.7–7.7)
Neutrophils Relative %: 79 %
Platelets: 383 10*3/uL (ref 150–400)
RBC: 3.09 MIL/uL — ABNORMAL LOW (ref 3.87–5.11)
RDW: 14.6 % (ref 11.5–15.5)
WBC: 21.1 10*3/uL — ABNORMAL HIGH (ref 4.0–10.5)
nRBC: 0 % (ref 0.0–0.2)

## 2021-02-19 MED ORDER — HYDROMORPHONE HCL 2 MG PO TABS: 2.0000 mg | ORAL_TABLET | ORAL | 0 refills | Status: DC | PRN

## 2021-02-19 MED ORDER — NYSTATIN 100000 UNIT/ML MT SUSP: 5.0000 mL | Freq: Four times a day (QID) | OROMUCOSAL | 2 refills | Status: DC

## 2021-02-19 MED ORDER — ESTRADIOL 2 MG PO TABS: 2.0000 mg | ORAL_TABLET | Freq: Every day | ORAL | 11 refills | Status: DC

## 2021-02-19 MED ORDER — LEVOFLOXACIN 750 MG PO TABS: 750.0000 mg | ORAL_TABLET | Freq: Every day | ORAL | 0 refills | Status: DC

## 2021-02-19 MED ORDER — FLUCONAZOLE 100 MG PO TABS: 100.0000 mg | ORAL_TABLET | Freq: Every day | ORAL | 0 refills | Status: DC

## 2021-02-19 MED ORDER — METRONIDAZOLE 500 MG PO TABS: 500.0000 mg | ORAL_TABLET | Freq: Three times a day (TID) | ORAL | 0 refills | Status: DC

## 2021-02-19 NOTE — Telephone Encounter (Signed)
Pt had surgery Friday, Dr. Elonda Husky ordered her Rx's at [facility] & she no longer uses that pharmacy  Please send Rx's to [place] [place]  [facility] need [facility] to cancel Rx/s due to pain meds that were ordered  Also, pt needs this today - she don't think she can get through the night without her pain meds  Please advise & notify pt

## 2021-02-19 NOTE — Telephone Encounter (Signed)
Thank you.  Done.

## 2021-02-19 NOTE — Telephone Encounter (Signed)
Called and cancelled dilaudid at [facility] [address]. Will get Dr. Elonda Husky to send to [facility] [address]. The other prescriptions can be transferred by patient per pharmacy.

## 2021-02-19 NOTE — Progress Notes (Signed)
ABD pad placed this morning over surgical site due to an area draining sanguinous fluid.  JP drain draining small amount of serosanguinous fluid , about 15 so far today.  Discharge instructions reviewed with patient and to follow up with Dr. Elonda Husky tomorrow.  Discharged with JP drain.  Sign other here and to drive home.

## 2021-02-20 ENCOUNTER — Other Ambulatory Visit: Payer: Self-pay

## 2021-02-20 ENCOUNTER — Encounter: Payer: Self-pay | Admitting: Obstetrics & Gynecology

## 2021-02-20 ENCOUNTER — Ambulatory Visit (INDEPENDENT_AMBULATORY_CARE_PROVIDER_SITE_OTHER): Payer: BC Managed Care – PPO | Admitting: Obstetrics & Gynecology

## 2021-02-20 VITALS — BP 107/75 | HR 103

## 2021-02-20 DIAGNOSIS — Z90722 Acquired absence of ovaries, bilateral: Secondary | ICD-10-CM

## 2021-02-20 DIAGNOSIS — Z90710 Acquired absence of both cervix and uterus: Secondary | ICD-10-CM

## 2021-02-20 DIAGNOSIS — Z9079 Acquired absence of other genital organ(s): Secondary | ICD-10-CM

## 2021-02-20 MED ORDER — DOXYCYCLINE HYCLATE 100 MG PO TABS
100.0000 mg | ORAL_TABLET | Freq: Two times a day (BID) | ORAL | 0 refills | Status: DC
Start: 2021-02-20 — End: 2021-03-05

## 2021-02-20 MED ORDER — AMOXICILLIN-POT CLAVULANATE 875-125 MG PO TABS: 1.0000 | ORAL_TABLET | Freq: Two times a day (BID) | ORAL | 0 refills | Status: DC

## 2021-02-20 NOTE — Progress Notes (Signed)
HPI: Patient returns for routine postoperative follow-up having undergone S/P TAH BSO on 02/14/21.  The patient's immediate postoperative recovery has been unremarkable. Since hospital discharge the patient reports some discharge.   Current Outpatient Medications: albuterol (PROAIR HFA) 108 (90 Base) MCG/ACT inhaler, TAKE 2 PUFFS EVERY 4 HOURS AS NEEDED (Patient taking differently: Inhale 2 puffs into the lungs every 4 (four) hours as needed for wheezing or shortness of breath. TAKE 2 PUFFS EVERY 4 HOURS AS NEEDED), Disp: 25.5 Inhaler, Rfl: 1 ALPRAZolam (XANAX) 1 MG tablet, Take 1 mg by mouth See admin instructions. Up to 6 times daily as needed for anxiety, Disp: , Rfl:  amoxicillin-clavulanate (AUGMENTIN) 875-125 MG tablet, Take 1 tablet by mouth 2 (two) times daily., Disp: 20 tablet, Rfl: 0 amphetamine-dextroamphetamine (ADDERALL) 20 MG tablet, Take 20 mg by mouth 3 (three) times daily as needed (ADD). , Disp: , Rfl:  azelastine (ASTELIN) 0.1 % nasal spray, Place 2 sprays into both nostrils 2 (two) times daily. Use in each nostril as directed (Patient taking differently: Place 2 sprays into both nostrils every other day. Use in each nostril as directed), Disp: 30 mL, Rfl: 0 Buprenorphine HCl-Naloxone HCl 8-2 MG FILM, Place 1.5 Film under the tongue daily., Disp: , Rfl:  divalproex (DEPAKOTE ER) 500 MG 24 hr tablet, Take 1,000 mg by mouth at bedtime. , Disp: , Rfl:  estradiol (ESTRACE) 2 MG tablet, Take 1 tablet (2 mg total) by mouth daily., Disp: 30 tablet, Rfl: 11 fluconazole (DIFLUCAN) 100 MG tablet, Take 1 tablet (100 mg total) by mouth daily., Disp: 14 tablet, Rfl: 0 gabapentin (NEURONTIN) 800 MG tablet, Take 800 mg by mouth 3 (three) times daily., Disp: , Rfl:  HYDROmorphone (DILAUDID) 2 MG tablet, Take 1 tablet (2 mg total) by mouth every 4 (four) hours as needed for severe pain., Disp: 40 tablet, Rfl: 0 ibuprofen (ADVIL) 200 MG tablet, Take 800 mg by mouth every 8 (eight) hours as needed  for mild pain or moderate pain., Disp: , Rfl:  levofloxacin (LEVAQUIN) 750 MG tablet, Take 1 tablet (750 mg total) by mouth daily., Disp: 14 tablet, Rfl: 0 metroNIDAZOLE (FLAGYL) 500 MG tablet, Take 1 tablet (500 mg total) by mouth every 8 (eight) hours., Disp: 42 tablet, Rfl: 0 nystatin (MYCOSTATIN) 100000 UNIT/ML suspension, Take 5 mLs (500,000 Units total) by mouth 4 (four) times daily. Swish and spit, Disp: 473 mL, Rfl: 2 omeprazole (PRILOSEC) 40 MG capsule, Take 40 mg by mouth daily as needed (heart burn)., Disp: , Rfl:  ondansetron (ZOFRAN) 8 MG tablet, Take 8 mg by mouth daily as needed for nausea. , Disp: , Rfl:  tiZANidine (ZANAFLEX) 4 MG tablet, Take 4 mg by mouth 3 (three) times daily as needed for muscle spasms., Disp: , Rfl:  zolpidem (AMBIEN) 10 MG tablet, Take 10 mg by mouth at bedtime., Disp: , Rfl:  lidocaine (LIDODERM) 5 %, Place 3 patches onto the skin daily. Remove & Discard patch within 12 hours or as directed by provider (Patient not taking: Reported on 02/20/2021), Disp: 270 patch, Rfl: 3  No current facility-administered medications for this visit.    Blood pressure 107/75, pulse (!) 103.  Physical Exam: Incision erythema but less angry than yesterday cellulitis but not a wound abscess right now, 3 areas of draining all irrigated with H2O2/water solution 120 cc slow irrigation with 7 Fr catheter JP drain is in place Abdominal exam is normal otherwise  Diagnostic Tests:   Pathology: TOA, fibroids  Impression:   ICD-10-CM  1. S/P TAH-BSO (total abdominal hysterectomy and bilateral salpingo-oophorectomy), due to fibroids/bu=ilateral TOAs  Z90.710    Z90.722    Z90.79         Plan: Orders Placed This Encounter     amoxicillin-clavulanate (AUGMENTIN) 875-125 MG tablet         Sig: Take 1 tablet by mouth 2 (two) times daily.         Dispense:  20 tablet         Refill:  0         Order Comments: She has taken augmentin without issues    Follow up: 2  days for wound care   Florian Buff, MD

## 2021-02-22 ENCOUNTER — Other Ambulatory Visit: Payer: Self-pay

## 2021-02-22 ENCOUNTER — Encounter: Payer: Self-pay | Admitting: Obstetrics & Gynecology

## 2021-02-22 ENCOUNTER — Ambulatory Visit (INDEPENDENT_AMBULATORY_CARE_PROVIDER_SITE_OTHER): Payer: BC Managed Care – PPO | Admitting: Obstetrics & Gynecology

## 2021-02-22 VITALS — BP 85/57 | HR 104

## 2021-02-22 DIAGNOSIS — Z90710 Acquired absence of both cervix and uterus: Secondary | ICD-10-CM

## 2021-02-22 DIAGNOSIS — L089 Local infection of the skin and subcutaneous tissue, unspecified: Secondary | ICD-10-CM

## 2021-02-22 DIAGNOSIS — Z029 Encounter for administrative examinations, unspecified: Secondary | ICD-10-CM

## 2021-02-22 DIAGNOSIS — Z9079 Acquired absence of other genital organ(s): Secondary | ICD-10-CM

## 2021-02-22 DIAGNOSIS — Z90722 Acquired absence of ovaries, bilateral: Secondary | ICD-10-CM

## 2021-02-22 NOTE — Progress Notes (Signed)
HPI: Patient returns for routine postoperative follow-up having undergone S/P TAH BSO on 02/14/21.  The patient's immediate postoperative recovery has been unremarkable. Since hospital discharge the patient reports some discharge.   Current Outpatient Medications: albuterol (PROAIR HFA) 108 (90 Base) MCG/ACT inhaler, TAKE 2 PUFFS EVERY 4 HOURS AS NEEDED (Patient taking differently: Inhale 2 puffs into the lungs every 4 (four) hours as needed for wheezing or shortness of breath. TAKE 2 PUFFS EVERY 4 HOURS AS NEEDED), Disp: 25.5 Inhaler, Rfl: 1 ALPRAZolam (XANAX) 1 MG tablet, Take 1 mg by mouth See admin instructions. Up to 6 times daily as needed for anxiety, Disp: , Rfl:  amoxicillin-clavulanate (AUGMENTIN) 875-125 MG tablet, Take 1 tablet by mouth 2 (two) times daily., Disp: 20 tablet, Rfl: 0 amphetamine-dextroamphetamine (ADDERALL) 20 MG tablet, Take 20 mg by mouth 3 (three) times daily as needed (ADD). , Disp: , Rfl:  azelastine (ASTELIN) 0.1 % nasal spray, Place 2 sprays into both nostrils 2 (two) times daily. Use in each nostril as directed (Patient taking differently: Place 2 sprays into both nostrils every other day. Use in each nostril as directed), Disp: 30 mL, Rfl: 0 Buprenorphine HCl-Naloxone HCl 8-2 MG FILM, Place 1.5 Film under the tongue daily., Disp: , Rfl:  divalproex (DEPAKOTE ER) 500 MG 24 hr tablet, Take 1,000 mg by mouth at bedtime. , Disp: , Rfl:  estradiol (ESTRACE) 2 MG tablet, Take 1 tablet (2 mg total) by mouth daily., Disp: 30 tablet, Rfl: 11 fluconazole (DIFLUCAN) 100 MG tablet, Take 1 tablet (100 mg total) by mouth daily., Disp: 14 tablet, Rfl: 0 gabapentin (NEURONTIN) 800 MG tablet, Take 800 mg by mouth 3 (three) times daily., Disp: , Rfl:  HYDROmorphone (DILAUDID) 2 MG tablet, Take 1 tablet (2 mg total) by mouth every 4 (four) hours as needed for severe pain., Disp: 40 tablet, Rfl: 0 ibuprofen (ADVIL) 200 MG tablet, Take 800 mg by mouth every 8 (eight) hours as needed  for mild pain or moderate pain., Disp: , Rfl:  levofloxacin (LEVAQUIN) 750 MG tablet, Take 1 tablet (750 mg total) by mouth daily., Disp: 14 tablet, Rfl: 0 metroNIDAZOLE (FLAGYL) 500 MG tablet, Take 1 tablet (500 mg total) by mouth every 8 (eight) hours., Disp: 42 tablet, Rfl: 0 nystatin (MYCOSTATIN) 100000 UNIT/ML suspension, Take 5 mLs (500,000 Units total) by mouth 4 (four) times daily. Swish and spit, Disp: 473 mL, Rfl: 2 omeprazole (PRILOSEC) 40 MG capsule, Take 40 mg by mouth daily as needed (heart burn)., Disp: , Rfl:  ondansetron (ZOFRAN) 8 MG tablet, Take 8 mg by mouth daily as needed for nausea. , Disp: , Rfl:  tiZANidine (ZANAFLEX) 4 MG tablet, Take 4 mg by mouth 3 (three) times daily as needed for muscle spasms., Disp: , Rfl:  zolpidem (AMBIEN) 10 MG tablet, Take 10 mg by mouth at bedtime., Disp: , Rfl:  lidocaine (LIDODERM) 5 %, Place 3 patches onto the skin daily. Remove & Discard patch within 12 hours or as directed by provider (Patient not taking: Reported on 02/20/2021), Disp: 270 patch, Rfl: 3  No current facility-administered medications for this visit.    Blood pressure 107/75, pulse (!) 103.  Physical Exam: Incision erythema but less angry than yesterday cellulitis but not a wound abscess right now, 3 areas of draining all irrigated with H2O2/water solution 120 cc slow irrigation with 7 Fr catheter JP drain is in place Abdominal exam is normal otherwise  Diagnostic Tests:   Pathology: TOA, fibroids  Impression:   ICD-10-CM  1. S/P TAH-BSO (total abdominal hysterectomy and bilateral salpingo-oophorectomy), due to fibroids/bu=ilateral TOAs  Z90.710    Z90.722    Z90.79     2. Wound infection  T14.8XXA    L08.9           Plan: Orders Placed This Encounter     amoxicillin-clavulanate (AUGMENTIN) 875-125 MG tablet         Sig: Take 1 tablet by mouth 2 (two) times daily.         Dispense:  20 tablet         Refill:  0         Order Comments: She has  taken augmentin without issues    Follow up: 2 days for wound care   Florian Buff, MD

## 2021-02-25 ENCOUNTER — Other Ambulatory Visit: Payer: Self-pay | Admitting: Obstetrics & Gynecology

## 2021-02-25 ENCOUNTER — Encounter: Payer: Self-pay | Admitting: Obstetrics & Gynecology

## 2021-02-25 MED ORDER — HYDROMORPHONE HCL 2 MG PO TABS: 2.0000 mg | ORAL_TABLET | ORAL | 0 refills | Status: DC | PRN

## 2021-02-26 ENCOUNTER — Ambulatory Visit (INDEPENDENT_AMBULATORY_CARE_PROVIDER_SITE_OTHER): Payer: BC Managed Care – PPO | Admitting: Obstetrics & Gynecology

## 2021-02-26 ENCOUNTER — Other Ambulatory Visit: Payer: Self-pay

## 2021-02-26 ENCOUNTER — Encounter: Payer: Self-pay | Admitting: Obstetrics & Gynecology

## 2021-02-26 VITALS — BP 117/82 | HR 88

## 2021-02-26 DIAGNOSIS — Z90710 Acquired absence of both cervix and uterus: Secondary | ICD-10-CM

## 2021-02-26 DIAGNOSIS — Z90722 Acquired absence of ovaries, bilateral: Secondary | ICD-10-CM

## 2021-02-26 DIAGNOSIS — Z9079 Acquired absence of other genital organ(s): Secondary | ICD-10-CM

## 2021-02-27 ENCOUNTER — Telehealth: Payer: Self-pay | Admitting: Obstetrics & Gynecology

## 2021-02-27 NOTE — Telephone Encounter (Signed)
Pt states that the provider that normally writes for her Suboxone will not write her for anymore until she's off meds that she's using from her surgery  Wonders if Dr. Elonda Husky could write for her Suboxone?  Please advise & call pt

## 2021-02-27 NOTE — Telephone Encounter (Signed)
Spoke with patient she didn't want to take a chance with Dr. Nehemiah Settle prescribing the suboxone and have her pain management doctor cut her off. She appreciates Dr. Elonda Husky checking for her.

## 2021-02-28 ENCOUNTER — Telehealth: Payer: Self-pay | Admitting: Obstetrics & Gynecology

## 2021-02-28 NOTE — Telephone Encounter (Signed)
Pt would like Dr. Elonda Husky to order her something for pain    [facility]

## 2021-03-01 ENCOUNTER — Other Ambulatory Visit: Payer: Self-pay | Admitting: Obstetrics & Gynecology

## 2021-03-01 MED ORDER — OXYCODONE-ACETAMINOPHEN 7.5-325 MG PO TABS: 1.0000 | ORAL_TABLET | Freq: Four times a day (QID) | ORAL | 0 refills | Status: DC | PRN

## 2021-03-05 ENCOUNTER — Other Ambulatory Visit: Payer: Self-pay

## 2021-03-05 ENCOUNTER — Ambulatory Visit (INDEPENDENT_AMBULATORY_CARE_PROVIDER_SITE_OTHER): Payer: BC Managed Care – PPO | Admitting: Obstetrics & Gynecology

## 2021-03-05 ENCOUNTER — Encounter: Payer: Self-pay | Admitting: Obstetrics & Gynecology

## 2021-03-05 ENCOUNTER — Telehealth: Payer: Self-pay | Admitting: Obstetrics & Gynecology

## 2021-03-05 ENCOUNTER — Encounter: Payer: Self-pay | Admitting: *Deleted

## 2021-03-05 VITALS — BP 117/79 | HR 77 | Ht 67.0 in | Wt 178.0 lb

## 2021-03-05 DIAGNOSIS — Z90722 Acquired absence of ovaries, bilateral: Secondary | ICD-10-CM

## 2021-03-05 DIAGNOSIS — Z9079 Acquired absence of other genital organ(s): Secondary | ICD-10-CM

## 2021-03-05 DIAGNOSIS — Z90710 Acquired absence of both cervix and uterus: Secondary | ICD-10-CM

## 2021-03-05 MED ORDER — OXYCODONE-ACETAMINOPHEN 5-325 MG PO TABS: 1.0000 | ORAL_TABLET | ORAL | 0 refills | Status: DC | PRN

## 2021-03-05 NOTE — Telephone Encounter (Signed)
Letter faxed. [place]

## 2021-03-05 NOTE — Telephone Encounter (Signed)
Patient called stating that he will not prescribed her another medication until she is done with Dr. Brynda Greathouse medication and she needs a letter stating that it is okay for her to take both medication and it is okay. Dr. Joylene John: [phone], patient states she is feeling crap still and she would really like our help with this other office.

## 2021-03-05 NOTE — Progress Notes (Signed)
  HPI: Patient returns for routine postoperative follow-up having undergone TAH BSO on 02/14/21.  The patient's immediate postoperative recovery has been unremarkable. Since hospital discharge the patient reports improving.   Current Outpatient Medications: albuterol (PROAIR HFA) 108 (90 Base) MCG/ACT inhaler, TAKE 2 PUFFS EVERY 4 HOURS AS NEEDED (Patient taking differently: Inhale 2 puffs into the lungs every 4 (four) hours as needed for wheezing or shortness of breath. TAKE 2 PUFFS EVERY 4 HOURS AS NEEDED), Disp: 25.5 Inhaler, Rfl: 1 ALPRAZolam (XANAX) 1 MG tablet, Take 1 mg by mouth See admin instructions. Up to 6 times daily as needed for anxiety, Disp: , Rfl:  amoxicillin-clavulanate (AUGMENTIN) 875-125 MG tablet, Take 1 tablet by mouth 2 (two) times daily., Disp: 20 tablet, Rfl: 0 amphetamine-dextroamphetamine (ADDERALL) 20 MG tablet, Take 20 mg by mouth 3 (three) times daily as needed (ADD). , Disp: , Rfl:  azelastine (ASTELIN) 0.1 % nasal spray, Place 2 sprays into both nostrils 2 (two) times daily. Use in each nostril as directed (Patient taking differently: Place 2 sprays into both nostrils every other day. Use in each nostril as directed), Disp: 30 mL, Rfl: 0 divalproex (DEPAKOTE ER) 500 MG 24 hr tablet, Take 1,000 mg by mouth at bedtime. , Disp: , Rfl:  estradiol (ESTRACE) 2 MG tablet, Take 1 tablet (2 mg total) by mouth daily., Disp: 30 tablet, Rfl: 11 fluconazole (DIFLUCAN) 100 MG tablet, Take 1 tablet (100 mg total) by mouth daily., Disp: 14 tablet, Rfl: 0 gabapentin (NEURONTIN) 800 MG tablet, Take 800 mg by mouth 3 (three) times daily., Disp: , Rfl:  ibuprofen (ADVIL) 200 MG tablet, Take 800 mg by mouth every 8 (eight) hours as needed for mild pain or moderate pain., Disp: , Rfl:  levofloxacin (LEVAQUIN) 750 MG tablet, Take 1 tablet (750 mg total) by mouth daily., Disp: 14 tablet, Rfl: 0 lidocaine (LIDODERM) 5 %, Place 3 patches onto the skin daily. Remove & Discard patch within 12  hours or as directed by provider, Disp: 270 patch, Rfl: 3 nystatin (MYCOSTATIN) 100000 UNIT/ML suspension, Take 5 mLs (500,000 Units total) by mouth 4 (four) times daily. Swish and spit, Disp: 473 mL, Rfl: 2 omeprazole (PRILOSEC) 40 MG capsule, Take 40 mg by mouth daily as needed (heart burn)., Disp: , Rfl:  ondansetron (ZOFRAN) 8 MG tablet, Take 8 mg by mouth daily as needed for nausea. , Disp: , Rfl:  oxyCODONE-acetaminophen (PERCOCET) 7.5-325 MG tablet, Take 1-2 tablets by mouth every 6 (six) hours as needed., Disp: 30 tablet, Rfl: 0 tiZANidine (ZANAFLEX) 4 MG tablet, Take 4 mg by mouth 3 (three) times daily as needed for muscle spasms., Disp: , Rfl:  zolpidem (AMBIEN) 10 MG tablet, Take 10 mg by mouth at bedtime., Disp: , Rfl:  Buprenorphine HCl-Naloxone HCl 8-2 MG FILM, Place 1.5 Film under the tongue daily. (Patient not taking: Reported on 03/05/2021), Disp: , Rfl:   No current facility-administered medications for this visit.    Blood pressure 117/79, pulse 77, height 5\' 7"  (1.702 m), weight 178 lb (80.7 kg), last menstrual period 04/20/2020.  Physical Exam: Incision clean dry intact  Abdomen is soft non tender normal post op  Diagnostic Tests:   Pathology: benign  Impression: TAH BSO due to fibroids and bilateral TOA   Plan: Weaning down pain meds while she is still efforting to get her suboxone refilled     Follow up: 3 weeks   Florian Buff, MD

## 2021-03-05 NOTE — Addendum Note (Signed)
Addended by: Florian Buff on: 03/05/2021 05:12 PM   Modules accepted: Orders

## 2021-03-06 ENCOUNTER — Encounter: Payer: BC Managed Care – PPO | Admitting: Obstetrics & Gynecology

## 2021-03-08 ENCOUNTER — Other Ambulatory Visit: Payer: Self-pay | Admitting: Obstetrics & Gynecology

## 2021-03-08 MED ORDER — BUPRENORPHINE HCL-NALOXONE HCL 8-2 MG SL FILM: 1.0000 | ORAL_FILM | Freq: Every day | SUBLINGUAL | 0 refills | Status: AC

## 2021-03-09 ENCOUNTER — Telehealth: Payer: Self-pay

## 2021-03-09 ENCOUNTER — Telehealth: Payer: Self-pay | Admitting: Obstetrics & Gynecology

## 2021-03-09 NOTE — Telephone Encounter (Signed)
I talked with the pharmacist at [facility], [place],  and after reviewing the change in federal requirements, it appears I am able to provide this patient with an emergency 30 day supply of her suboxone.  This is only done because she is post op and her regular provider will not prescribe while she is getting post op pain management.  I was referred to the federal website and it appears in this setting I can provide it to the patient.  The patient is aware I will not be refilling it in the future  Meds ordered this encounter  Medications   Buprenorphine HCl-Naloxone HCl 8-2 MG FILM    Sig: Place 1-2 Film under the tongue daily.    Dispense:  45 each    Refill:  0    Pt can take 1.5 per day could only choose 1-2 for Epic to take it     Florian Buff, MD 03/09/2021 11:11 AM

## 2021-03-09 NOTE — Telephone Encounter (Signed)
Patient called she is needing another letter faxed to her suboxone doctor stating that Dr.Eure is no longer giving her medication so they can resume her Suboxone she states she isnt doing well ph# [phone]

## 2021-03-09 NOTE — Telephone Encounter (Signed)
Patient called stating that she is not able to come today and get her medication, patient states that she would like for Dr. Elonda Husky to write a latter stating that he will not be able to write her anymore narcotics and send it to her other provider. Please contact pt

## 2021-03-13 ENCOUNTER — Other Ambulatory Visit: Payer: Self-pay | Admitting: Obstetrics & Gynecology

## 2021-03-13 MED ORDER — OXYCODONE-ACETAMINOPHEN 5-325 MG PO TABS: 1.0000 | ORAL_TABLET | ORAL | 0 refills | Status: DC | PRN

## 2021-03-27 ENCOUNTER — Encounter: Payer: Self-pay | Admitting: Obstetrics & Gynecology

## 2021-03-27 ENCOUNTER — Ambulatory Visit (INDEPENDENT_AMBULATORY_CARE_PROVIDER_SITE_OTHER): Payer: BC Managed Care – PPO | Admitting: Obstetrics & Gynecology

## 2021-03-27 ENCOUNTER — Other Ambulatory Visit: Payer: Self-pay

## 2021-03-27 VITALS — BP 140/91 | HR 69 | Ht 67.0 in | Wt 178.0 lb

## 2021-03-27 DIAGNOSIS — Z90710 Acquired absence of both cervix and uterus: Secondary | ICD-10-CM | POA: Diagnosis not present

## 2021-03-27 DIAGNOSIS — Z90722 Acquired absence of ovaries, bilateral: Secondary | ICD-10-CM

## 2021-03-27 DIAGNOSIS — M62838 Other muscle spasm: Secondary | ICD-10-CM

## 2021-03-27 DIAGNOSIS — Z9079 Acquired absence of other genital organ(s): Secondary | ICD-10-CM | POA: Diagnosis not present

## 2021-03-27 MED ORDER — ESCITALOPRAM OXALATE 10 MG PO TABS
10.0000 mg | ORAL_TABLET | Freq: Every day | ORAL | 1 refills | Status: DC
Start: 2021-03-27 — End: 2021-04-20

## 2021-03-27 NOTE — Progress Notes (Signed)
  HPI: Patient returns for routine postoperative follow-up having undergone TAH BSO on 02/12/21.  The patient's immediate postoperative recovery has been unremarkable. Since hospital discharge the patient reports neck back pain.   Current Outpatient Medications: albuterol (PROAIR HFA) 108 (90 Base) MCG/ACT inhaler, TAKE 2 PUFFS EVERY 4 HOURS AS NEEDED (Patient taking differently: Inhale 2 puffs into the lungs every 4 (four) hours as needed for wheezing or shortness of breath. TAKE 2 PUFFS EVERY 4 HOURS AS NEEDED), Disp: 25.5 Inhaler, Rfl: 1 ALPRAZolam (XANAX) 1 MG tablet, Take 1 mg by mouth See admin instructions. Up to 6 times daily as needed for anxiety, Disp: , Rfl:  Buprenorphine HCl-Naloxone HCl 8-2 MG FILM, Place 1-2 Film under the tongue daily., Disp: 45 each, Rfl: 0 divalproex (DEPAKOTE ER) 500 MG 24 hr tablet, Take 1,000 mg by mouth at bedtime. , Disp: , Rfl:  estradiol (ESTRACE) 2 MG tablet, Take 1 tablet (2 mg total) by mouth daily., Disp: 30 tablet, Rfl: 11 ibuprofen (ADVIL) 200 MG tablet, Take 800 mg by mouth every 8 (eight) hours as needed for mild pain or moderate pain., Disp: , Rfl:  lidocaine (LIDODERM) 5 %, Place 3 patches onto the skin daily. Remove & Discard patch within 12 hours or as directed by provider, Disp: 270 patch, Rfl: 3 LINZESS 145 MCG CAPS capsule, Take 145 mcg by mouth daily., Disp: , Rfl:  omeprazole (PRILOSEC) 40 MG capsule, Take 40 mg by mouth daily as needed (heart burn)., Disp: , Rfl:  ondansetron (ZOFRAN) 8 MG tablet, Take 8 mg by mouth daily as needed for nausea. , Disp: , Rfl:  tiZANidine (ZANAFLEX) 4 MG tablet, Take 4 mg by mouth 3 (three) times daily as needed for muscle spasms., Disp: , Rfl:  zolpidem (AMBIEN) 10 MG tablet, Take 10 mg by mouth at bedtime., Disp: , Rfl:  amphetamine-dextroamphetamine (ADDERALL) 20 MG tablet, Take 20 mg by mouth 3 (three) times daily as needed (ADD).  (Patient not taking: Reported on 03/27/2021), Disp: , Rfl:  azelastine  (ASTELIN) 0.1 % nasal spray, Place 2 sprays into both nostrils 2 (two) times daily. Use in each nostril as directed (Patient not taking: Reported on 03/27/2021), Disp: 30 mL, Rfl: 0 gabapentin (NEURONTIN) 800 MG tablet, Take 800 mg by mouth 3 (three) times daily. (Patient not taking: Reported on 03/27/2021), Disp: , Rfl:  montelukast (SINGULAIR) 10 MG tablet, Take 10 mg by mouth at bedtime. (Patient not taking: Reported on 03/27/2021), Disp: , Rfl:   No current facility-administered medications for this visit.    Blood pressure (!) 140/91, pulse 69, height 5\' 7"  (1.702 m), weight 178 lb (80.7 kg), last menstrual period 04/20/2020.  Physical Exam: Incision clean dry intact Cuff healing well Abdominal exam is normal    Diagnostic Tests:   Pathology: benign  Impression:   ICD-10-CM   1. S/P TAH-BSO (total abdominal hysterectomy and bilateral salpingo-oophorectomy), due to fibroids/bilateral TOAs  Z90.710    Z90.722    Z90.79     2. Neck muscle spasm  M62.838    Trigger point injections bilateral trapezious, several sites, 20 cc total           Plan: Will try lexapro    Follow up: No follow-ups on file.   Florian Buff, MD

## 2021-03-29 ENCOUNTER — Other Ambulatory Visit: Payer: Self-pay | Admitting: Obstetrics & Gynecology

## 2021-03-29 MED ORDER — OXYCODONE-ACETAMINOPHEN 5-325 MG PO TABS: 1.0000 | ORAL_TABLET | ORAL | 0 refills | Status: DC | PRN

## 2021-04-04 NOTE — Discharge Summary (Signed)
Physician Discharge Summary  Patient ID: Barbara Rogers MRN: [id number] DOB/AGE: [birth year] 40 y.o.  Admit date: 02/14/2021 Discharge date: 6/13  Admission Diagnoses:S/P TAH BSO  Discharge Diagnoses:  Active Problems:   Fibroids   Abnormal uterine bleeding (AUB)   Bilateral tubo-ovarian mass   S/P TAH (total abdominal hysterectomy)   Discharged Condition: stable  Hospital Course: post operative antibiotics for bilateral TOA good response to therapy  Consults: None  Significant Diagnostic Studies: labs:    Treatments: antibiotics: gent clinda and surgery: augmentin  Discharge Exam: Blood pressure (!) 93/56, pulse 81, temperature 98.8 F (37.1 C), temperature source Oral, resp. rate 18, height '5\' 7"'$  (1.702 m), weight 84.8 kg, SpO2 97 %. General appearance: alert, cooperative, and no distress GI: soft, non-tender; bowel sounds normal; no masses,  no organomegaly Incision/Wound:healing well  Disposition: Discharge disposition: 01-Home or Self Care       Discharge Instructions     Call MD for:  persistant nausea and vomiting   Complete by: As directed    Call MD for:  severe uncontrolled pain   Complete by: As directed    Call MD for:  temperature >100.4   Complete by: As directed    Diet - low sodium heart healthy   Complete by: As directed    Discharge wound care:   Complete by: As directed    As instructed by Dr Elonda Husky   Driving Restrictions   Complete by: As directed    No driving for 1 week   Increase activity slowly   Complete by: As directed    Lifting restrictions   Complete by: As directed    Do not lift more than 10 pounds for 6 weeks   Sexual Activity Restrictions   Complete by: As directed    No sex for 8 weeks      Allergies as of 02/19/2021       Reactions   Penicillins Other (See Comments)   Unknown Has patient had a PCN reaction causing immediate rash, facial/tongue/throat swelling, SOB or lightheadedness with hypotension:  unknown Has  patient had a PCN reaction causing severe rash involving mucus membranes or skin necrosis:  unknown Has patient had a PCN reaction that required hospitalization  unknown Has patient had a PCN reaction occurring within the last 10 years: unknown If all of the above answers are "NO", the   Progestins    Extreme moodiness   Sinequan [doxepin] Other (See Comments), Hypertension   Hallucinations, delusions, severe agitation        Medication List     STOP taking these medications    leuprolide 3.75 MG injection Commonly known as: LUPRON       TAKE these medications    ALPRAZolam 1 MG tablet Commonly known as: XANAX Take 1 mg by mouth See admin instructions. Up to 6 times daily as needed for anxiety   amphetamine-dextroamphetamine 20 MG tablet Commonly known as: ADDERALL Take 20 mg by mouth 3 (three) times daily as needed (ADD).   divalproex 500 MG 24 hr tablet Commonly known as: DEPAKOTE ER Take 1,000 mg by mouth at bedtime.   estradiol 2 MG tablet Commonly known as: ESTRACE Take 1 tablet (2 mg total) by mouth daily.   gabapentin 800 MG tablet Commonly known as: NEURONTIN Take 800 mg by mouth 3 (three) times daily.   ibuprofen 200 MG tablet Commonly known as: ADVIL Take 800 mg by mouth every 8 (eight) hours as needed for mild pain or moderate  pain.   omeprazole 40 MG capsule Commonly known as: PRILOSEC Take 40 mg by mouth daily as needed (heart burn).   ondansetron 8 MG tablet Commonly known as: ZOFRAN Take 8 mg by mouth daily as needed for nausea.   tiZANidine 4 MG tablet Commonly known as: ZANAFLEX Take 4 mg by mouth 3 (three) times daily as needed for muscle spasms.   zolpidem 10 MG tablet Commonly known as: AMBIEN Take 10 mg by mouth at bedtime.       ASK your doctor about these medications    albuterol 108 (90 Base) MCG/ACT inhaler Commonly known as: ProAir HFA TAKE 2 PUFFS EVERY 4 HOURS AS NEEDED   azelastine 0.1 % nasal spray Commonly known  as: ASTELIN Place 2 sprays into both nostrils 2 (two) times daily. Use in each nostril as directed   lidocaine 5 % Commonly known as: LIDODERM Place 3 patches onto the skin daily. Remove & Discard patch within 12 hours or as directed by provider               Discharge Care Instructions  (From admission, onward)           Start     Ordered   02/19/21 0000  Discharge wound care:       Comments: As instructed by Dr Elonda Husky   02/19/21 1242            Follow-up Information     Florian Buff, MD Follow up on 02/20/2021.   Specialties: Obstetrics and Gynecology, Radiology Why: 4 pm for wound evaluation Contact information: [place] [address] [phone]                 Signed: Florian Buff

## 2021-04-08 NOTE — Progress Notes (Signed)
HPI: Patient returns for routine postoperative follow-up having undergone S/P TAH BSO on 02/14/21.  The patient's immediate postoperative recovery has been unremarkable. Since hospital discharge the patient reports some discharge.   Current Outpatient Medications: albuterol (PROAIR HFA) 108 (90 Base) MCG/ACT inhaler, TAKE 2 PUFFS EVERY 4 HOURS AS NEEDED (Patient taking differently: Inhale 2 puffs into the lungs every 4 (four) hours as needed for wheezing or shortness of breath. TAKE 2 PUFFS EVERY 4 HOURS AS NEEDED), Disp: 25.5 Inhaler, Rfl: 1 ALPRAZolam (XANAX) 1 MG tablet, Take 1 mg by mouth See admin instructions. Up to 6 times daily as needed for anxiety, Disp: , Rfl:  amoxicillin-clavulanate (AUGMENTIN) 875-125 MG tablet, Take 1 tablet by mouth 2 (two) times daily., Disp: 20 tablet, Rfl: 0 amphetamine-dextroamphetamine (ADDERALL) 20 MG tablet, Take 20 mg by mouth 3 (three) times daily as needed (ADD). , Disp: , Rfl:  azelastine (ASTELIN) 0.1 % nasal spray, Place 2 sprays into both nostrils 2 (two) times daily. Use in each nostril as directed (Patient taking differently: Place 2 sprays into both nostrils every other day. Use in each nostril as directed), Disp: 30 mL, Rfl: 0 Buprenorphine HCl-Naloxone HCl 8-2 MG FILM, Place 1.5 Film under the tongue daily., Disp: , Rfl:  divalproex (DEPAKOTE ER) 500 MG 24 hr tablet, Take 1,000 mg by mouth at bedtime. , Disp: , Rfl:  estradiol (ESTRACE) 2 MG tablet, Take 1 tablet (2 mg total) by mouth daily., Disp: 30 tablet, Rfl: 11 fluconazole (DIFLUCAN) 100 MG tablet, Take 1 tablet (100 mg total) by mouth daily., Disp: 14 tablet, Rfl: 0 gabapentin (NEURONTIN) 800 MG tablet, Take 800 mg by mouth 3 (three) times daily., Disp: , Rfl:  HYDROmorphone (DILAUDID) 2 MG tablet, Take 1 tablet (2 mg total) by mouth every 4 (four) hours as needed for severe pain., Disp: 40 tablet, Rfl: 0 ibuprofen (ADVIL) 200 MG tablet, Take 800 mg by mouth every 8 (eight) hours as needed  for mild pain or moderate pain., Disp: , Rfl:  levofloxacin (LEVAQUIN) 750 MG tablet, Take 1 tablet (750 mg total) by mouth daily., Disp: 14 tablet, Rfl: 0 metroNIDAZOLE (FLAGYL) 500 MG tablet, Take 1 tablet (500 mg total) by mouth every 8 (eight) hours., Disp: 42 tablet, Rfl: 0 nystatin (MYCOSTATIN) 100000 UNIT/ML suspension, Take 5 mLs (500,000 Units total) by mouth 4 (four) times daily. Swish and spit, Disp: 473 mL, Rfl: 2 omeprazole (PRILOSEC) 40 MG capsule, Take 40 mg by mouth daily as needed (heart burn)., Disp: , Rfl:  ondansetron (ZOFRAN) 8 MG tablet, Take 8 mg by mouth daily as needed for nausea. , Disp: , Rfl:  tiZANidine (ZANAFLEX) 4 MG tablet, Take 4 mg by mouth 3 (three) times daily as needed for muscle spasms., Disp: , Rfl:  zolpidem (AMBIEN) 10 MG tablet, Take 10 mg by mouth at bedtime., Disp: , Rfl:  lidocaine (LIDODERM) 5 %, Place 3 patches onto the skin daily. Remove & Discard patch within 12 hours or as directed by provider (Patient not taking: Reported on 02/20/2021), Disp: 270 patch, Rfl: 3  No current facility-administered medications for this visit.    Blood pressure 107/75, pulse (!) 103.  Physical Exam: Incision erythema but less angry than yesterday cellulitis but not a wound abscess right now, 3 areas of draining all irrigated with H2O2/water solution 120 cc slow irrigation with 7 Fr catheter JP drain is in place Abdominal exam is normal otherwise  Diagnostic Tests:   Pathology: TOA, fibroids  Impression:    ICD-10-CM  1. S/P TAH-BSO (total abdominal hysterectomy and bilateral salpingo-oophorectomy), due to fibroids/bu=ilateral TOAs  Z90.710    Z90.722    Z90.79            Plan: Orders Placed This Encounter     amoxicillin-clavulanate (AUGMENTIN) 875-125 MG tablet         Sig: Take 1 tablet by mouth 2 (two) times daily.         Dispense:  20 tablet         Refill:  0         Order Comments: She has taken augmentin without issues    Follow  up: No follow-ups on file.    Florian Buff, MD

## 2021-04-20 ENCOUNTER — Other Ambulatory Visit: Payer: Self-pay | Admitting: Obstetrics & Gynecology

## 2021-04-23 ENCOUNTER — Encounter: Payer: Self-pay | Admitting: Obstetrics & Gynecology

## 2021-05-01 ENCOUNTER — Other Ambulatory Visit: Payer: Self-pay

## 2021-05-01 ENCOUNTER — Ambulatory Visit (INDEPENDENT_AMBULATORY_CARE_PROVIDER_SITE_OTHER): Payer: BC Managed Care – PPO | Admitting: Obstetrics & Gynecology

## 2021-05-01 ENCOUNTER — Encounter: Payer: Self-pay | Admitting: Obstetrics & Gynecology

## 2021-05-01 VITALS — BP 99/69 | HR 81 | Ht 67.0 in | Wt 180.5 lb

## 2021-05-01 DIAGNOSIS — R5383 Other fatigue: Secondary | ICD-10-CM

## 2021-05-01 DIAGNOSIS — M62830 Muscle spasm of back: Secondary | ICD-10-CM | POA: Diagnosis not present

## 2021-05-01 DIAGNOSIS — F329 Major depressive disorder, single episode, unspecified: Secondary | ICD-10-CM | POA: Diagnosis not present

## 2021-05-01 MED ORDER — EST ESTROGENS-METHYLTEST 0.625-1.25 MG PO TABS: 1.0000 | ORAL_TABLET | Freq: Every day | ORAL | 3 refills | Status: DC

## 2021-05-01 MED ORDER — ESCITALOPRAM OXALATE 20 MG PO TABS: 20.0000 mg | ORAL_TABLET | Freq: Every day | ORAL | 3 refills | Status: DC

## 2021-05-01 NOTE — Progress Notes (Signed)
Chief Complaint  Patient presents with   Follow-up    On Lexapro; feels tired all the time      40 y.o. G3P0030 Patient's last menstrual period was 04/20/2020. The current method of family planning is chysterectomy.  Outpatient Encounter Medications as of 05/01/2021  Medication Sig   albuterol (PROAIR HFA) 108 (90 Base) MCG/ACT inhaler TAKE 2 PUFFS EVERY 4 HOURS AS NEEDED (Patient taking differently: Inhale 2 puffs into the lungs every 4 (four) hours as needed for wheezing or shortness of breath. TAKE 2 PUFFS EVERY 4 HOURS AS NEEDED)   ALPRAZolam (XANAX) 1 MG tablet Take 1 mg by mouth See admin instructions. Up to 6 times daily as needed for anxiety   amphetamine-dextroamphetamine (ADDERALL) 20 MG tablet Take 20 mg by mouth 3 (three) times daily as needed (ADD).   azelastine (ASTELIN) 0.1 % nasal spray Place 2 sprays into both nostrils 2 (two) times daily. Use in each nostril as directed   Buprenorphine HCl-Naloxone HCl 8-2 MG FILM Place 1-2 Film under the tongue daily.   divalproex (DEPAKOTE ER) 500 MG 24 hr tablet Take 1,000 mg by mouth at bedtime.    estradiol (ESTRACE) 2 MG tablet Take 1 tablet (2 mg total) by mouth daily.   estrogen-methylTESTOSTERone 0.625-1.25 MG tablet Take 1 tablet by mouth daily.   ibuprofen (ADVIL) 200 MG tablet Take 800 mg by mouth every 8 (eight) hours as needed for mild pain or moderate pain.   lidocaine (LIDODERM) 5 % Place 3 patches onto the skin daily. Remove & Discard patch within 12 hours or as directed by provider   LINZESS 145 MCG CAPS capsule Take 145 mcg by mouth daily.   montelukast (SINGULAIR) 10 MG tablet Take 10 mg by mouth at bedtime.   omeprazole (PRILOSEC) 40 MG capsule Take 40 mg by mouth daily as needed (heart burn).   ondansetron (ZOFRAN) 8 MG tablet Take 8 mg by mouth daily as needed for nausea.    oxyCODONE-acetaminophen (PERCOCET/ROXICET) 5-325 MG tablet Take 1 tablet by mouth every 4 (four) hours as needed for severe pain.    tiZANidine (ZANAFLEX) 4 MG tablet Take 4 mg by mouth 3 (three) times daily as needed for muscle spasms.   zolpidem (AMBIEN) 10 MG tablet Take 10 mg by mouth at bedtime.   [DISCONTINUED] escitalopram (LEXAPRO) 10 MG tablet TAKE 1 TABLET BY MOUTH EVERY DAY   escitalopram (LEXAPRO) 20 MG tablet Take 1 tablet (20 mg total) by mouth daily.   gabapentin (NEURONTIN) 800 MG tablet Take 800 mg by mouth 3 (three) times daily. (Patient not taking: Reported on 05/01/2021)   No facility-administered encounter medications on file as of 05/01/2021.    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   Dysmenorrhea    Dysphagia    Dyspnea    Finger fracture, left 10/31/2012   LEFT 4th finger   Insomnia    Lumbar transverse process fracture [facility])    MDD (major depressive disorder), recurrent episode, severe ([place]) 06/30/2016  MDD (major depressive disorder), recurrent, severe, 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   Migraine without aura    Mood disorder ([place])    Nephrolithiasis    OD (overdose of drug), intentional self-harm, initial encounter ([place]) 06/26/2016   PMDD (premenstrual dysphoric disorder)    Pneumonia    Respiratory failure ([place])    Seizure-like activity ([place]) 06/30/2016   normal EEG, question pseudoseizures   T12 burst fracture ([place])    Traumatic hemo-pneumothorax    Traumatic hemopneumothorax 02/09/2016    Past Surgical History:  Procedure Laterality Date   FRACTURE SURGERY     HARDWARE REMOVAL N/A 04/07/2019   Procedure: Removal of Thoracolumbar hardware;  Surgeon: Kary Kos, MD;  Location: [place];   Service: Neurosurgery;  Laterality: N/A;  Removal of Thoracolumbar hardware   HYSTERECTOMY ABDOMINAL WITH SALPINGO-OOPHORECTOMY Bilateral 02/14/2021   Procedure: OPEN TOTAL ABDOMINAL HYSTERECTOMY WITH BILATERAL SALPINGO-OOPHORECTOMY;  Surgeon: Florian Buff, MD;  Location: [facility];  Service: Gynecology;  Laterality: Bilateral;   POSTERIOR LUMBAR FUSION 4 LEVEL N/A 01/30/2016   Procedure: T9-L2 Posterior Stabilization, Posterior Lumbar Fusion with Pedicle Screws;  Surgeon: Kary Kos, MD;  Location: [place] [facility];  Service: Neurosurgery;  Laterality: N/A;    OB History     Gravida  3   Para  0   Term  0   Preterm  0   AB  3   Living         SAB  0   IAB  3   Ectopic  0   Multiple      Live Births              Allergies  Allergen Reactions   Penicillins Other (See Comments)    Unknown Has patient had a PCN reaction causing immediate rash, facial/tongue/throat swelling, SOB or lightheadedness with hypotension:  unknown Has patient had a PCN reaction causing severe rash involving mucus membranes or skin necrosis:  unknown Has patient had a PCN reaction that required hospitalization  unknown Has patient had a PCN reaction occurring within the last 10 years: unknown If all of the above answers are "NO", the   Progestins     Extreme moodiness   Sinequan [Doxepin] Other (See Comments) and Hypertension    Hallucinations, delusions, severe agitation    Social History   Socioeconomic History   Marital status: Single    Spouse name: N/A   Number of children: 0   Years of education: 12   Highest education level: Not on file  Occupational History   Occupation: [employer]    Employer: [employer]    Comment: [employer]   Occupation: Currently on disability  Tobacco Use   Smoking status: Every Day    Packs/day: 1.00    Types: Cigarettes   Smokeless tobacco: Never  Vaping Use   Vaping Use: Some days  Substance and Sexual Activity   Alcohol use: Not Currently    Comment:  occassionally   Drug use: Yes    Frequency: 1.0 times per week    Types: Marijuana    Comment: daily   Sexual activity: Yes    Partners: Male    Birth control/protection: Surgical    Comment: hyst  Other Topics Concern   Not on file  Social History Narrative   ** Merged History Encounter **       Lived with her boyfriend/fiance for several years, [place]. They split up in spring/summer 2018.   Lives alone.   Both parents deceased during her childhood.  She found her mother's body following her suicide, when she was 39 years old.   Raised by her grandparents, also now deceased.   Social Determinants of Health   Financial Resource Strain: Low Risk    Difficulty of Paying Living Expenses: Not very hard  Food Insecurity: No Food Insecurity   Worried About Charity fundraiser in the Last Year: Never true   Arboriculturist in the Last Year: Never true  Transportation Needs: Unmet Transportation Needs   Lack of Transportation (Medical): Yes   Lack of Transportation (Non-Medical): Yes  Physical Activity: Insufficiently Active   Days of Exercise per Week: 6 days   Minutes of Exercise per Session: 10 min  Stress: Stress Concern Present   Feeling of Stress : Very much  Social Connections: Moderately Integrated   Frequency of Communication with Friends and Family: More than three times a week   Frequency of Social Gatherings with Friends and Family: More than three times a week   Attends Religious Services: Never   Marine scientist or Organizations: No   Attends Music therapist: 1 to 4 times per year   Marital Status: Living with partner    Family History  Problem Relation Age of Onset   Migraines Mother    Mental illness Mother    Heart disease Father    Alcohol abuse Father    Arthritis Maternal Grandmother    Thyroid disease Maternal Grandmother    Heart disease Maternal Grandfather        AMI 1996, 2014   Anemia Maternal Grandfather        bone marrow  dysfunction    Medications:       Current Outpatient Medications:    albuterol (PROAIR HFA) 108 (90 Base) MCG/ACT inhaler, TAKE 2 PUFFS EVERY 4 HOURS AS NEEDED (Patient taking differently: Inhale 2 puffs into the lungs every 4 (four) hours as needed for wheezing or shortness of breath. TAKE 2 PUFFS EVERY 4 HOURS AS NEEDED), Disp: 25.5 Inhaler, Rfl: 1   ALPRAZolam (XANAX) 1 MG tablet, Take 1 mg by mouth See admin instructions. Up to 6 times daily as needed for anxiety, Disp: , Rfl:    amphetamine-dextroamphetamine (ADDERALL) 20 MG tablet, Take 20 mg by mouth 3 (three) times daily as needed (ADD)., Disp: , Rfl:    azelastine (ASTELIN) 0.1 % nasal spray, Place 2 sprays into both nostrils 2 (two) times daily. Use in each nostril as directed, Disp: 30 mL, Rfl: 0   Buprenorphine HCl-Naloxone HCl 8-2 MG FILM, Place 1-2 Film under the tongue daily., Disp: 45 each, Rfl: 0   divalproex (DEPAKOTE ER) 500 MG 24 hr tablet, Take 1,000 mg by mouth at bedtime. , Disp: , Rfl:    estradiol (ESTRACE) 2 MG tablet, Take 1 tablet (2 mg total) by mouth daily., Disp: 30 tablet, Rfl: 11   estrogen-methylTESTOSTERone 0.625-1.25 MG tablet, Take 1 tablet by mouth daily., Disp: 30 tablet, Rfl: 3   ibuprofen (ADVIL) 200 MG tablet, Take 800 mg by mouth every 8 (eight) hours as needed for mild pain or moderate pain., Disp: , Rfl:    lidocaine (LIDODERM) 5 %, Place 3 patches onto the skin daily. Remove & Discard patch within 12 hours or as directed by provider, Disp: 270 patch, Rfl: 3   LINZESS 145 MCG CAPS capsule, Take 145 mcg by mouth daily., Disp: , Rfl:    montelukast (SINGULAIR) 10 MG tablet, Take 10 mg by mouth at bedtime.,  Disp: , Rfl:    omeprazole (PRILOSEC) 40 MG capsule, Take 40 mg by mouth daily as needed (heart burn)., Disp: , Rfl:    ondansetron (ZOFRAN) 8 MG tablet, Take 8 mg by mouth daily as needed for nausea. , Disp: , Rfl:    oxyCODONE-acetaminophen (PERCOCET/ROXICET) 5-325 MG tablet, Take 1 tablet by mouth  every 4 (four) hours as needed for severe pain., Disp: 12 tablet, Rfl: 0   tiZANidine (ZANAFLEX) 4 MG tablet, Take 4 mg by mouth 3 (three) times daily as needed for muscle spasms., Disp: , Rfl:    zolpidem (AMBIEN) 10 MG tablet, Take 10 mg by mouth at bedtime., Disp: , Rfl:    escitalopram (LEXAPRO) 20 MG tablet, Take 1 tablet (20 mg total) by mouth daily., Disp: 30 tablet, Rfl: 3   gabapentin (NEURONTIN) 800 MG tablet, Take 800 mg by mouth 3 (three) times daily. (Patient not taking: Reported on 05/01/2021), Disp: , Rfl:   Objective Blood pressure 99/69, pulse 81, height '5\' 7"'$  (1.702 m), weight 180 lb 8 oz (81.9 kg), last menstrual period 04/20/2020.  Trigger Point Injection   Pre-operative diagnosis:  Paraspinous/PSIS/trapezius  Post-operative diagnosis: myofascial pain  After risks and benefits were explained including bleeding, infection, worsening of the pain, damage to the area being injected, weakness, allergic reaction to medications, vascular injection, and nerve damage, signed consent was obtained.  All questions were answered.    The area of the trigger point was identified and the skin prepped three times with alcohol and the alcohol allowed to dry.  Next, a 25 gauge 0.5 inch needle was placed in the area of the trigger point.  Once reproduction of the pain was elicited and negative aspiration confirmed, the trigger point was injected and the needle removed.    The patient did tolerate the procedure well and there were complications.    Medication used: 20 cc 0.75% Marcaine  Trigger points injected: several    Trigger point(s) location(s):  bilateral   Pertinent ROS No burning with urination, frequency or urgency No nausea, vomiting or diarrhea Nor fever chills or other constitutional symptoms   Labs or studies     Impression Diagnoses this Encounter::   ICD-10-CM   1. Back spasm  M62.830     2. Reactive depression  F32.9       Established relevant  diagnosis(es):   Plan/Recommendations: Meds ordered this encounter  Medications   escitalopram (LEXAPRO) 20 MG tablet    Sig: Take 1 tablet (20 mg total) by mouth daily.    Dispense:  30 tablet    Refill:  3   estrogen-methylTESTOSTERone 0.625-1.25 MG tablet    Sig: Take 1 tablet by mouth daily.    Dispense:  30 tablet    Refill:  3    Labs or Scans Ordered: No orders of the defined types were placed in this encounter.   Management:: Increase lexapro to 20 mg daily Add methyltestosterone at patient's request  Inj trigger points done  Follow up Return if symptoms worsen or fail to improve.       All questions were answered.

## 2021-05-03 ENCOUNTER — Other Ambulatory Visit: Payer: Self-pay | Admitting: Obstetrics & Gynecology

## 2021-05-03 ENCOUNTER — Telehealth: Payer: Self-pay | Admitting: Obstetrics & Gynecology

## 2021-05-03 NOTE — Telephone Encounter (Signed)
There is not another alternative that is covered

## 2021-05-03 NOTE — Telephone Encounter (Signed)
Patient stated that Elonda Husky put her on a new medicine estrogen-methylTESTOSTERone on Tuesday and was wanting to see if he could change it due to her insurance not covering it and it being more than she could afford right now due to being out of work.

## 2021-06-03 ENCOUNTER — Other Ambulatory Visit: Payer: Self-pay | Admitting: Obstetrics & Gynecology

## 2021-06-05 ENCOUNTER — Other Ambulatory Visit: Payer: Self-pay | Admitting: Obstetrics & Gynecology

## 2021-10-17 ENCOUNTER — Encounter: Payer: Self-pay | Admitting: Obstetrics & Gynecology

## 2022-01-15 ENCOUNTER — Telehealth: Payer: Self-pay | Admitting: Orthopaedic Surgery

## 2022-01-15 NOTE — Telephone Encounter (Signed)
Patient is inquiring about having an evaluation of her back by Dr Luna Glasgow. States as per notes, she has had back surgery with hardware, and said she is only 'looking for a doctor to talk with her like a person, possibly take Xrays, and make any recommendations such as exercises that will help her perform her job at [employer].  Patient is aware that Dr Luna Glasgow will bew out of clinic until 6./6./23. Please review and advise. ?

## 2022-01-16 NOTE — Telephone Encounter (Signed)
I called LM for patient to call me back.  Need to advise the below per Dr Luna Glasgow. ?

## 2022-01-22 NOTE — Telephone Encounter (Signed)
No response from patient, closing note.  Will advise if patient calls back.  ?

## 2022-03-04 ENCOUNTER — Emergency Department (HOSPITAL_COMMUNITY)
Admission: EM | Admit: 2022-03-04 | Discharge: 2022-03-04 | Disposition: A | Discharge status: Home / Self Care | Payer: BC Managed Care – PPO | Attending: Emergency Medicine | Admitting: Emergency Medicine

## 2022-03-04 ENCOUNTER — Emergency Department (HOSPITAL_COMMUNITY): Payer: BC Managed Care – PPO

## 2022-03-04 ENCOUNTER — Other Ambulatory Visit: Payer: Self-pay

## 2022-03-04 DIAGNOSIS — R110 Nausea: Secondary | ICD-10-CM | POA: Diagnosis not present

## 2022-03-04 DIAGNOSIS — R1013 Epigastric pain: Secondary | ICD-10-CM | POA: Insufficient documentation

## 2022-03-04 DIAGNOSIS — R1012 Left upper quadrant pain: Secondary | ICD-10-CM | POA: Insufficient documentation

## 2022-03-04 LAB — CBC
HCT: 46.4 % — ABNORMAL HIGH (ref 36.0–46.0)
Hemoglobin: 15.5 g/dL — ABNORMAL HIGH (ref 12.0–15.0)
MCH: 31.4 pg (ref 26.0–34.0)
MCHC: 33.4 g/dL (ref 30.0–36.0)
MCV: 94.1 fL (ref 80.0–100.0)
Platelets: 177 10*3/uL (ref 150–400)
RBC: 4.93 MIL/uL (ref 3.87–5.11)
RDW: 13.3 % (ref 11.5–15.5)
WBC: 10.4 10*3/uL (ref 4.0–10.5)
nRBC: 0 % (ref 0.0–0.2)

## 2022-03-04 LAB — COMPREHENSIVE METABOLIC PANEL
ALT: 22 U/L (ref 0–44)
AST: 18 U/L (ref 15–41)
Albumin: 4.4 g/dL (ref 3.5–5.0)
Alkaline Phosphatase: 97 U/L (ref 38–126)
Anion gap: 11 (ref 5–15)
BUN: 23 mg/dL — ABNORMAL HIGH (ref 6–20)
CO2: 25 mmol/L (ref 22–32)
Calcium: 9.4 mg/dL (ref 8.9–10.3)
Chloride: 100 mmol/L (ref 98–111)
Creatinine, Ser: 0.93 mg/dL (ref 0.44–1.00)
GFR, Estimated: >60 mL/min (ref >60)
Glucose, Bld: 92 mg/dL (ref 70–99)
Potassium: 3.5 mmol/L (ref 3.5–5.1)
Sodium: 136 mmol/L (ref 135–145)
Total Bilirubin: 0.6 mg/dL (ref 0.3–1.2)
Total Protein: 8 g/dL (ref 6.5–8.1)

## 2022-03-04 LAB — LIPASE, BLOOD: Lipase: 22 U/L (ref 11–51)

## 2022-03-04 MED ORDER — PANTOPRAZOLE SODIUM 40 MG PO TBEC: 40.0000 mg | DELAYED_RELEASE_TABLET | Freq: Every day | ORAL | 1 refills | Status: AC

## 2022-03-04 NOTE — ED Triage Notes (Signed)
Patient coming to ED for evaluation of LUQ abdominal pain.  Reports severe pain started this morning.  Has had constipation, nausea.  No vomiting or fever.  Decreased PO intake

## 2022-03-30 ENCOUNTER — Other Ambulatory Visit: Payer: Self-pay | Admitting: Obstetrics & Gynecology

## 2022-04-03 ENCOUNTER — Other Ambulatory Visit: Payer: Self-pay

## 2022-04-03 ENCOUNTER — Encounter (HOSPITAL_COMMUNITY): Payer: Self-pay | Admitting: *Deleted

## 2022-04-03 DIAGNOSIS — N3000 Acute cystitis without hematuria: Secondary | ICD-10-CM | POA: Diagnosis not present

## 2022-04-03 DIAGNOSIS — J45909 Unspecified asthma, uncomplicated: Secondary | ICD-10-CM | POA: Insufficient documentation

## 2022-04-03 DIAGNOSIS — Z7951 Long term (current) use of inhaled steroids: Secondary | ICD-10-CM | POA: Diagnosis not present

## 2022-04-03 DIAGNOSIS — R109 Unspecified abdominal pain: Secondary | ICD-10-CM | POA: Diagnosis present

## 2022-04-03 LAB — URINALYSIS, ROUTINE W REFLEX MICROSCOPIC
Bilirubin Urine: NEGATIVE
Glucose, UA: NEGATIVE mg/dL
Hgb urine dipstick: NEGATIVE
Ketones, ur: 5 mg/dL — AB
Nitrite: NEGATIVE
Protein, ur: 30 mg/dL — AB
Specific Gravity, Urine: 1.028 (ref 1.005–1.030)
pH: 5 (ref 5.0–8.0)

## 2022-04-03 NOTE — ED Triage Notes (Signed)
Pt in c/o L flank pain onset yesterday, pt c/o dysuria, pt denies hematuria, denies fever & chills, A&O x4

## 2022-04-04 ENCOUNTER — Emergency Department (HOSPITAL_COMMUNITY)
Admission: EM | Admit: 2022-04-04 | Discharge: 2022-04-04 | Disposition: A | Discharge status: Home / Self Care | Payer: BC Managed Care – PPO | Attending: Emergency Medicine | Admitting: Emergency Medicine

## 2022-04-04 DIAGNOSIS — R109 Unspecified abdominal pain: Secondary | ICD-10-CM

## 2022-04-04 DIAGNOSIS — N3000 Acute cystitis without hematuria: Secondary | ICD-10-CM

## 2022-04-04 MED ORDER — CEPHALEXIN 500 MG PO CAPS
500.0000 mg | ORAL_CAPSULE | Freq: Once | ORAL | Status: AC
  Administered 2022-04-04: 500 mg via ORAL
  Filled 2022-04-04: qty 1

## 2022-04-04 MED ORDER — KETOROLAC TROMETHAMINE 60 MG/2ML IM SOLN
60.0000 mg | Freq: Once | INTRAMUSCULAR | Status: AC
  Administered 2022-04-04: 60 mg via INTRAMUSCULAR
  Filled 2022-04-04: qty 2

## 2022-04-04 MED ORDER — CEPHALEXIN 500 MG PO CAPS
500.0000 mg | ORAL_CAPSULE | Freq: Four times a day (QID) | ORAL | 0 refills | Status: AC
Start: 2022-04-04 — End: ?

## 2022-04-04 NOTE — ED Provider Notes (Signed)
[facility] EMERGENCY DEPARTMENT Provider Note   CSN: [id number] Arrival date & time: 04/03/22  2019     History  Chief Complaint  Patient presents with   Flank Pain    Barbara Rogers is a 41 y.o. female.  The history is provided by the patient.  Flank Pain This is a new problem. The current episode started 12 to 24 hours ago. The problem occurs constantly. The problem has been gradually worsening. Nothing relieves the symptoms.   Patient with history of chronic pain presents with flank pain and back pain.  She reports the pain starts in her left flank and does radiate into her left leg.  No focal weakness is reported.  She is also had some dysuria.  No fevers or vomiting.  No other significant abdominal pain.  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   Dysmenorrhea    Dysphagia    Dyspnea    Finger fracture, left 10/31/2012   LEFT 4th finger   Insomnia    Lumbar transverse process fracture (HCC)    MDD (major depressive disorder), recurrent episode, severe ([place]) 06/30/2016   MDD (major depressive disorder), recurrent, severe, 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 Authorizing Provider  cephALEXin (KEFLEX) 500 MG capsule Take 1 capsule (500 mg total) by mouth 4 (four) times daily. 04/04/22  Yes Ripley Fraise, MD  albuterol (PROAIR HFA) 108 (90 Base) MCG/ACT inhaler TAKE 2 PUFFS EVERY 4 HOURS AS NEEDED Patient taking differently: Inhale 2 puffs into the lungs every 4 (four) hours as needed for wheezing or shortness of breath. TAKE 2 PUFFS EVERY 4 HOURS AS NEEDED 09/24/18   Jacelyn Pi, Lilia Argue, MD  ALPRAZolam Duanne Moron) 1 MG tablet Take 1 mg by mouth See admin instructions. Up to 6 times daily as needed for anxiety    [provider]  amphetamine-dextroamphetamine (ADDERALL) 20 MG tablet Take 20 mg by mouth 3 (three) times daily as needed (ADD).    [provider]  azelastine (ASTELIN) 0.1 % nasal spray Place 2 sprays into both nostrils 2 (two) times daily. Use in each nostril as directed 12/25/18   Jacelyn Pi, Lilia Argue, MD  Buprenorphine HCl-Naloxone HCl 8-2 MG FILM Place 1-2 Film under the tongue daily. 03/08/21   Florian Buff, MD  divalproex (DEPAKOTE ER) 500 MG 24 hr tablet Take 1,000 mg by mouth at bedtime.     [provider]  escitalopram (LEXAPRO) 20  MG tablet TAKE 1 TABLET BY MOUTH EVERY DAY 06/03/21   Florian Buff, MD  gabapentin (NEURONTIN) 800 MG tablet Take 800 mg by mouth 3 (three) times daily.    [provider]  ibuprofen (ADVIL) 200 MG tablet Take 800 mg by mouth every 8 (eight) hours as needed for mild pain or moderate pain.    [provider]  lidocaine (LIDODERM) 5 % Place 3 patches onto the skin daily. Remove & Discard patch within 12 hours or as directed by provider 12/06/17   Harrison Mons, PA  LINZESS 145 MCG CAPS capsule Take 145 mcg by mouth daily. 03/20/21   [provider]  omeprazole (PRILOSEC) 40 MG capsule Take 40 mg by mouth daily as needed (heart burn).     [provider]  ondansetron (ZOFRAN) 8 MG tablet Take 8 mg by mouth daily as needed for nausea.  11/11/16   [provider]  pantoprazole (PROTONIX) 40 MG tablet Take 1 tablet (40 mg total) by mouth daily. 03/04/22 05/03/22  Noemi Chapel, MD  tiZANidine (ZANAFLEX) 4 MG tablet Take 12 mg by mouth 3 (three) times daily as needed for muscle spasms.    [provider]  zolpidem (AMBIEN) 10 MG tablet Take 10 mg by mouth at bedtime.    [provider]      Allergies    Penicillins, Progestins, and Sinequan [doxepin]    Review of Systems   Review of Systems  Constitutional:  Negative for fever.  Genitourinary:  Positive for flank pain.    Physical Exam Updated Vital Signs BP 139/79   Pulse 70   Temp 98.5 F (36.9 C) (Oral)   Resp 18   Ht 1.702 m ('5\' 7"'$ )   LMP 04/20/2020   SpO2 98%   BMI 32.89 kg/m  Physical Exam CONSTITUTIONAL: Disheveled, appears older than stated age HEAD: Normocephalic/atraumatic EYES: EOMI/PERRL ENMT: Mucous membranes moist NECK: supple no meningeal signs SPINE/BACK:entire spine nontender, no bruising/crepitance/stepoffs noted to spine CV: S1/S2 noted, no murmurs/rubs/gallops noted LUNGS: Lungs are clear to auscultation bilaterally, no apparent distress ABDOMEN: soft, nontender, no rebound or guarding GU: Left cva tenderness, no erythema is noted NEURO: Awake/alert, equal motor 5/5 strength noted with the following: hip flexion/knee flexion/extension, foot dorsi/plantar flexion, great toe extension intact bilaterally, no sensory deficit in any dermatome. Pt is able to ambulate unassisted. EXTREMITIES: pulses normal, full ROM SKIN: warm, color normal PSYCH: no abnormalities of mood noted, alert and oriented to situation  ED Results / Procedures / Treatments   Labs (all labs ordered are listed, but only abnormal results are displayed) Labs Reviewed  URINALYSIS, ROUTINE W REFLEX MICROSCOPIC - Abnormal; Notable for the  following components:      Result Value   APPearance HAZY (*)    Ketones, ur 5 (*)    Protein, ur 30 (*)    Leukocytes,Ua MODERATE (*)    Bacteria, UA RARE (*)    All other components within normal limits    EKG None  Radiology No results found.  Procedures Procedures    Medications Ordered in ED Medications  cephALEXin (KEFLEX) capsule 500 mg (500 mg Oral Given 04/04/22 0259)  ketorolac (TORADOL) injection 60 mg (60 mg Intramuscular Given 04/04/22 0259)    ED Course/ Medical Decision Making/ A&P                           Medical Decision Making Amount and/or Complexity of Data Reviewed Labs:  ordered.  Risk Prescription drug management.   Patient presented with low back and flank pain. Patient does have a history of chronic pain but reports this feels different than previous back pain Patient is able to ambulate and there is no focal weakness. She did report dysuria with some evidence of UTI.  She just had a CT scan last month that revealed right nephrolithiasis only Given her appearance and exam, there is no indication for emergent imaging at this time After discussion, will give Toradol for pain.  She is already on Suboxone at home We will start oral antibiotics. We discussed strict return precautions.  Patient is safe for outpatient management        Final Clinical Impression(s) / ED Diagnoses Final diagnoses:  Flank pain  Acute cystitis without hematuria    Rx / DC Orders ED Discharge Orders          Ordered    cephALEXin (KEFLEX) 500 MG capsule  4 times daily        04/04/22 0249              Ripley Fraise, MD 04/04/22 [phone]

## 2022-04-04 NOTE — Discharge Instructions (Signed)

## 2022-06-13 ENCOUNTER — Other Ambulatory Visit: Payer: Self-pay | Admitting: Obstetrics & Gynecology

## 2022-11-07 ENCOUNTER — Encounter: Payer: Self-pay | Admitting: Radiology

## 2024-04-30 ENCOUNTER — Encounter: Payer: Self-pay | Admitting: Radiology

## 2024-07-12 ENCOUNTER — Encounter: Payer: Self-pay | Admitting: Radiology
